# Patient Record
Sex: Male | Born: 1950
Health system: Southern US, Community
[De-identification: ages and names within clinical notes are randomized; demographics above are authoritative.]

## PROBLEM LIST (undated history)

## (undated) DIAGNOSIS — G629 Polyneuropathy, unspecified: Secondary | ICD-10-CM

## (undated) DIAGNOSIS — M549 Dorsalgia, unspecified: Secondary | ICD-10-CM

## (undated) DIAGNOSIS — F419 Anxiety disorder, unspecified: Secondary | ICD-10-CM

## (undated) DIAGNOSIS — I219 Acute myocardial infarction, unspecified: Secondary | ICD-10-CM

## (undated) DIAGNOSIS — I2510 Atherosclerotic heart disease of native coronary artery without angina pectoris: Secondary | ICD-10-CM

## (undated) DIAGNOSIS — Z8619 Personal history of other infectious and parasitic diseases: Secondary | ICD-10-CM

## (undated) DIAGNOSIS — E785 Hyperlipidemia, unspecified: Secondary | ICD-10-CM

## (undated) DIAGNOSIS — F32A Depression, unspecified: Secondary | ICD-10-CM

## (undated) DIAGNOSIS — R0600 Dyspnea, unspecified: Secondary | ICD-10-CM

## (undated) DIAGNOSIS — G4730 Sleep apnea, unspecified: Secondary | ICD-10-CM

## (undated) DIAGNOSIS — H269 Unspecified cataract: Secondary | ICD-10-CM

## (undated) DIAGNOSIS — M1990 Unspecified osteoarthritis, unspecified site: Secondary | ICD-10-CM

## (undated) DIAGNOSIS — C801 Malignant (primary) neoplasm, unspecified: Secondary | ICD-10-CM

## (undated) DIAGNOSIS — K219 Gastro-esophageal reflux disease without esophagitis: Secondary | ICD-10-CM

## (undated) DIAGNOSIS — T7840XA Allergy, unspecified, initial encounter: Secondary | ICD-10-CM

## (undated) DIAGNOSIS — F329 Major depressive disorder, single episode, unspecified: Secondary | ICD-10-CM

## (undated) DIAGNOSIS — I10 Essential (primary) hypertension: Secondary | ICD-10-CM

## (undated) HISTORY — DX: Anxiety disorder, unspecified: F41.9

## (undated) HISTORY — DX: Gastro-esophageal reflux disease without esophagitis: K21.9

## (undated) HISTORY — PX: BILATERAL CARPAL TUNNEL RELEASE: SHX6508

## (undated) HISTORY — DX: Depression, unspecified: F32.A

## (undated) HISTORY — DX: Personal history of other infectious and parasitic diseases: Z86.19

## (undated) HISTORY — DX: Unspecified cataract: H26.9

## (undated) HISTORY — PX: COSMETIC SURGERY: SHX468

## (undated) HISTORY — DX: Essential (primary) hypertension: I10

## (undated) HISTORY — DX: Unspecified osteoarthritis, unspecified site: M19.90

## (undated) HISTORY — PX: CARDIAC CATHETERIZATION: SHX172

## (undated) HISTORY — DX: Hyperlipidemia, unspecified: E78.5

## (undated) HISTORY — DX: Acute myocardial infarction, unspecified: I21.9

## (undated) HISTORY — DX: Dorsalgia, unspecified: M54.9

## (undated) HISTORY — DX: Allergy, unspecified, initial encounter: T78.40XA

## (undated) HISTORY — PX: OTHER SURGICAL HISTORY: SHX169

## (undated) HISTORY — DX: Malignant (primary) neoplasm, unspecified: C80.1

## (undated) HISTORY — DX: Major depressive disorder, single episode, unspecified: F32.9

## (undated) HISTORY — DX: Atherosclerotic heart disease of native coronary artery without angina pectoris: I25.10

## (undated) HISTORY — DX: Sleep apnea, unspecified: G47.30

---
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
## 1982-05-05 HISTORY — PX: WRIST SURGERY: SHX841

## 1995-05-06 DIAGNOSIS — I219 Acute myocardial infarction, unspecified: Secondary | ICD-10-CM

## 1995-05-06 HISTORY — DX: Acute myocardial infarction, unspecified: I21.9

## 1995-05-06 HISTORY — PX: ANGIOPLASTY: SHX39

## 1999-05-06 HISTORY — PX: KNEE ARTHROSCOPY: SHX127

## 1999-10-04 ENCOUNTER — Ambulatory Visit (HOSPITAL_COMMUNITY): Admission: RE | Admit: 1999-10-04 | Discharge: 1999-10-04 | Payer: Self-pay | Admitting: Cardiovascular Disease

## 2000-04-06 ENCOUNTER — Encounter: Admission: RE | Admit: 2000-04-06 | Discharge: 2000-05-04 | Payer: Self-pay | Admitting: Family Medicine

## 2006-07-09 HISTORY — PX: DOPPLER ECHOCARDIOGRAPHY: SHX263

## 2007-05-06 DIAGNOSIS — Z8619 Personal history of other infectious and parasitic diseases: Secondary | ICD-10-CM

## 2007-05-06 HISTORY — DX: Personal history of other infectious and parasitic diseases: Z86.19

## 2008-01-26 ENCOUNTER — Encounter (INDEPENDENT_AMBULATORY_CARE_PROVIDER_SITE_OTHER): Payer: Self-pay | Admitting: *Deleted

## 2008-02-21 ENCOUNTER — Ambulatory Visit: Payer: Self-pay | Admitting: Internal Medicine

## 2008-02-21 DIAGNOSIS — I2510 Atherosclerotic heart disease of native coronary artery without angina pectoris: Secondary | ICD-10-CM | POA: Insufficient documentation

## 2008-02-21 DIAGNOSIS — F528 Other sexual dysfunction not due to a substance or known physiological condition: Secondary | ICD-10-CM

## 2008-02-21 DIAGNOSIS — K219 Gastro-esophageal reflux disease without esophagitis: Secondary | ICD-10-CM

## 2008-02-24 ENCOUNTER — Encounter (INDEPENDENT_AMBULATORY_CARE_PROVIDER_SITE_OTHER): Payer: Self-pay | Admitting: *Deleted

## 2008-02-24 LAB — CONVERTED CEMR LAB
ALT: 32 units/L (ref 0–53)
AST: 27 units/L (ref 0–37)
CO2: 28 meq/L (ref 19–32)
GFR calc Af Amer: 111 mL/min
Glucose, Bld: 109 mg/dL — ABNORMAL HIGH (ref 70–99)
Lymphocytes Relative: 34.2 % (ref 12.0–46.0)
Monocytes Absolute: 0.2 10*3/uL (ref 0.1–1.0)
Monocytes Relative: 2.8 % — ABNORMAL LOW (ref 3.0–12.0)
PSA: 2.34 ng/mL (ref 0.10–4.00)
Platelets: 189 10*3/uL (ref 150–400)
Potassium: 4.8 meq/L (ref 3.5–5.1)
RDW: 12.4 % (ref 11.5–14.6)
Sodium: 142 meq/L (ref 135–145)

## 2009-05-28 ENCOUNTER — Telehealth (INDEPENDENT_AMBULATORY_CARE_PROVIDER_SITE_OTHER): Payer: Self-pay | Admitting: *Deleted

## 2009-06-29 ENCOUNTER — Ambulatory Visit: Payer: Self-pay | Admitting: Internal Medicine

## 2009-06-29 LAB — CONVERTED CEMR LAB
BUN: 13 mg/dL (ref 6–23)
Basophils Absolute: 0 10*3/uL (ref 0.0–0.1)
Basophils Relative: 0.5 % (ref 0.0–3.0)
Chloride: 108 meq/L (ref 96–112)
Cholesterol: 129 mg/dL (ref 0–200)
Eosinophils Absolute: 0.3 10*3/uL (ref 0.0–0.7)
GFR calc non Af Amer: 72.42 mL/min (ref >60)
HDL: 56.6 mg/dL (ref >39.00)
LDL Cholesterol: 41 mg/dL (ref 0–99)
Lymphocytes Relative: 32.3 % (ref 12.0–46.0)
MCHC: 32.9 g/dL (ref 30.0–36.0)
Neutrophils Relative %: 55 % (ref 43.0–77.0)
Platelets: 198 10*3/uL (ref 150.0–400.0)
Potassium: 5.2 meq/L — ABNORMAL HIGH (ref 3.5–5.1)
RBC: 4.84 M/uL (ref 4.22–5.81)
Sodium: 141 meq/L (ref 135–145)
Triglycerides: 159 mg/dL — ABNORMAL HIGH (ref 0.0–149.0)
VLDL: 31.8 mg/dL (ref 0.0–40.0)

## 2009-07-10 ENCOUNTER — Ambulatory Visit: Payer: Self-pay | Admitting: Internal Medicine

## 2009-07-10 DIAGNOSIS — E119 Type 2 diabetes mellitus without complications: Secondary | ICD-10-CM | POA: Insufficient documentation

## 2009-10-31 ENCOUNTER — Encounter: Payer: Self-pay | Admitting: Internal Medicine

## 2009-11-13 ENCOUNTER — Encounter: Payer: Self-pay | Admitting: Internal Medicine

## 2010-06-02 LAB — CONVERTED CEMR LAB
Albumin: 3.9 g/dL
Alkaline Phosphatase: 52 units/L
BUN: 13 mg/dL
Creatinine, Ser: 0.97 mg/dL
Glucose, Bld: 117 mg/dL
HCT: 42.4 %
HDL: 61 mg/dL
Hemoglobin: 14.8 g/dL
LDL Cholesterol: 36 mg/dL
MCH: 34.8 pg
MCV: 92.6 fL
PSA: 0.89 ng/mL
RBC count: 4.58 10*6/uL
Total Protein: 7.1 g/dL
Triglycerides: 121 mg/dL
WBC, blood: 8.9 10*3/uL

## 2010-06-06 NOTE — Letter (Signed)
Summary: changed from vytorin to simva (cost)---cardiology  [facility]   Imported By: Lanelle Bal 11/22/2009 12:50:15  _____________________________________________________________________  External Attachment:    Type:   Image     Comment:   External Document

## 2010-06-06 NOTE — Miscellaneous (Signed)
Summary: Labs  Clinical Lists Changes  Observations: Added new observation of PSA: 0.89 ng/mL (10/31/2009 13:35) Added new observation of TSH: 2.569 microintl units/mL (10/31/2009 13:35) Added new observation of TRIGLYC TOT: 121 mg/dL (16/02/9603 54:09) Added new observation of LDL: 36 mg/dL (81/19/1478 29:56) Added new observation of HDL: 61 mg/dL (21/30/8657 84:69) Added new observation of CHOLESTEROL: 121 mg/dL (62/95/2841 32:44) Added new observation of BILI TOTAL: 0.9 mg/dL (05/07/7251 66:44) Added new observation of ALK PHOS: 52 units/L (10/31/2009 13:35) Added new observation of SGPT (ALT): 23 units/L (10/31/2009 13:35) Added new observation of SGOT (AST): 24 units/L (10/31/2009 13:35) Added new observation of PROTEIN, TOT: 7.1 g/dL (03/47/4259 56:38) Added new observation of ALBUMIN: 3.9 g/dL (75/64/3329 51:88) Added new observation of CALCIUM: 8.9 mg/dL (41/66/0630 16:01) Added new observation of GLUCOSE SER: 117 mg/dL (09/32/3557 32:20) Added new observation of CREATININE: 0.97 mg/dL (25/42/7062 37:62) Added new observation of BUN: 13 mg/dL (83/15/1761 60:73) Added new observation of CO2 TOTAL: 29 mmol/L (10/31/2009 13:35) Added new observation of CHLORIDE: 105 mmol/L (10/31/2009 13:35) Added new observation of POTASSIUM: 4.5 mmol/L (10/31/2009 13:35) Added new observation of SODIUM: 139 mmol/L (10/31/2009 13:35) Added new observation of PLATELETS: 198 10*3/mm3 (10/31/2009 13:35) Added new observation of MCH: 34.8 pg (10/31/2009 13:35) Added new observation of MCV: 92.6 fL (10/31/2009 13:35) Added new observation of HCT: 42.4 % (10/31/2009 13:35) Added new observation of HGB: 14.8 g/dL (71/10/2692 85:46) Added new observation of RBC: 4.58 10*6/mm3 (10/31/2009 13:35) Added new observation of WBC: 8.9 10*3/mm3 (10/31/2009 13:35)

## 2010-06-06 NOTE — Assessment & Plan Note (Signed)
Summary: CPX/NS/KDC, resched.- jr   Vital Signs:  Patient profile:   60 year old male Height:      69.25 inches Weight:      199.6 pounds BMI:     29.37 Pulse rate:   58 / minute BP sitting:   150 / 80  Vitals Entered By: Shary Decamp (July 10, 2009 2:47 PM) CC: cpx, wakes up with numbness in hands off & on x 1 year Is Patient Diabetic? No   History of Present Illness: CPX wakes up with numbness in hands off & on x 1 year, symptoms more frecuent lately numbness is B and involves the whole hand  no posterior neck pain, occasionally pain L from the neck x 2 weeks   Preventive Screening-Counseling & Management  Alcohol-Tobacco     Alcohol type: wkends  Caffeine-Diet-Exercise     Caffeine use/day: 2     Does Patient Exercise: yes     Type of exercise: walk     Times/week: 3      Drug Use:  no.    Current Medications (verified): 1)  Metoprolol Tartrate 50 Mg Tabs (Metoprolol Tartrate) .... 1/2 By Mouth Two Times A Day 2)  Amlodipine Besylate 5 Mg Tabs (Amlodipine Besylate) .Marland Kitchen.. 1 By Mouth Once Daily 3)  Lisinopril 20 Mg Tabs (Lisinopril) .Marland Kitchen.. 1 By Mouth Once Daily 4)  Vytorin 10-40 Mg Tabs (Ezetimibe-Simvastatin) .Marland Kitchen.. 1 By Mouth Once Daily 5)  Zantac 150 Mg Caps (Ranitidine Hcl) .... Once Daily 6)  Aspirin 325 Mg Tabs (Aspirin) 7)  Vitamin C 500mg  8)  Vitamin E 400mg  9)  Centrum Silver  Tabs (Multiple Vitamins-Minerals) 10)  Nitrostat 0.3 Mg Subl (Nitroglycerin) .... As Needed 11)  Viagra 100 Mg Tabs (Sildenafil Citrate) .... 1/2 To 1 By Mouth Once Daily As Needed  Allergies (verified): No Known Drug Allergies  Past History:  Past Medical History: CAD, Dr Tresa Endo, s/p stents----- sees cards yearly  Myocardial infarction, hx of (1997) patient reports stress test 2010 (neg) basal cell carcinoma removed- face x 3  squamous cell removed- [place] GERD L4-L5 bulging disc, L5-S1 - bulging disc, pinched nerve in neck multiple enviormental allergies shingles  (2009) Diabetes mellitus, type II    Past Surgical History: Reviewed history from 02/21/2008 and no changes required. Inguinal herniorrhaphy (1951) keloid (1985) - wrist scope rt knee (2002) angioplasty x 2 (1997)  Family History: Reviewed history from 02/21/2008 and no changes required. CAD - MGM DM - MGM, bro, cousin, nephew HTN - sister colon Ca - no stroke - M (TIA) prostate Ca - F, bro d x age 44  Social History: Married @ girls (1 adopted ) Occupation:executive recruter Tobacco-- quit in the 90s ETOH-- weekends  diet-- trying to eat healthy,count calories, has ost wt in the last 2 months  exercise-- 5/week, 30 min treadmil Does Patient Exercise:  yes Drug Use:  no Caffeine use/day:  2  Review of Systems CV:  Denies chest pain or discomfort and swelling of feet. Resp:  Denies cough and shortness of breath. GI:  Denies bloody stools, nausea, and vomiting. GU:  Denies dysuria and hematuria. Psych:  Denies anxiety and depression; uses advil PM works well when can't sleep.  Physical Exam  General:  alert, well-developed, and well-nourished.   Neck:  no masses, no thyromegaly, and normal carotid upstroke.   Lungs:  normal respiratory effort, no intercostal retractions, no accessory muscle use, and normal breath sounds.   Heart:  normal rate, regular rhythm, and  no murmur.   Abdomen:  soft, non-tender, no distention, no masses, no guarding, and no rigidity.  no bruit  Rectal:  + external hemorhhoid x 1  noted. Normal sphincter tone. No rectal masses or tenderness. hemocult neg  Prostate:  Prostate gland firm and smooth, no enlargement, nodularity, tenderness, mass, asymmetry or induration. Extremities:  no pretibial edema bilaterally    Impression & Recommendations:  Problem # 1:  HEALTH SCREENING (ICD-V70.0) Td 02  had a Cscope, Dr Arlyce Dice per chart review, next Cscope due 2012  all labs reviewed   Problem # 2:  CORONARY ARTERY DISEASE (ICD-414.00) stable   His updated medication list for this problem includes:    Metoprolol Tartrate 50 Mg Tabs (Metoprolol tartrate) .Marland Kitchen... 1/2 by mouth two times a day    Amlodipine Besylate 5 Mg Tabs (Amlodipine besylate) .Marland Kitchen... 1 by mouth once daily    Lisinopril 20 Mg Tabs (Lisinopril) .Marland Kitchen... 1 by mouth once daily    Aspirin 325 Mg Tabs (Aspirin)    Nitrostat 0.3 Mg Subl (Nitroglycerin) .Marland Kitchen... As needed  Problem # 3:  DIABETES MELLITUS, TYPE II (ICD-250.00) I thought this was a new diagnoses based on the hemoglobin A1c from few days ago  however the patient states that he knew about his DM, previous A1C was 6.7 plan: nutritionist referal -- declined, went to one before, loosing wt in the last few weeks   His updated medication list for this problem includes:    Lisinopril 20 Mg Tabs (Lisinopril) .Marland Kitchen... 1 by mouth once daily    Aspirin 325 Mg Tabs (Aspirin)  Labs Reviewed: Creat: 0.9 (02/21/2008)     Problem # 4:  CTS CTS splinters, declined   Problem # 5:  ERECTILE DYSFUNCTION (ICD-302.72) needs a RF very aware of interaction w/ NTG His updated medication list for this problem includes:    Viagra 100 Mg Tabs (Sildenafil citrate) .Marland Kitchen... 1/2 to 1 by mouth once daily as needed  Complete Medication List: 1)  Metoprolol Tartrate 50 Mg Tabs (Metoprolol tartrate) .... 1/2 by mouth two times a day 2)  Amlodipine Besylate 5 Mg Tabs (Amlodipine besylate) .Marland Kitchen.. 1 by mouth once daily 3)  Lisinopril 20 Mg Tabs (Lisinopril) .Marland Kitchen.. 1 by mouth once daily 4)  Vytorin 10-40 Mg Tabs (Ezetimibe-simvastatin) .Marland Kitchen.. 1 by mouth once daily 5)  Zantac 150 Mg Caps (Ranitidine hcl) .... Once daily 6)  Aspirin 325 Mg Tabs (Aspirin) 7)  Vitamin C 500mg   8)  Vitamin E 400mg   9)  Centrum Silver Tabs (Multiple vitamins-minerals) 10)  Nitrostat 0.3 Mg Subl (Nitroglycerin) .... As needed 11)  Viagra 100 Mg Tabs (Sildenafil citrate) .... 1/2 to 1 by mouth once daily as needed  Patient Instructions: 1)  Please schedule a follow-up  appointment in 3 months .  Prescriptions: VIAGRA 100 MG TABS (SILDENAFIL CITRATE) 1/2 to 1 by mouth once daily as needed  #10 x 3   Entered and Authorized by:   Elita Quick E. Daxtyn Rottenberg MD   Signed by:   Nolon Rod. Jenesys Casseus MD on 07/10/2009   Method used:   Print then Give to Patient   RxID:   [id number]    Preventive Care Screening  Last Tetanus Booster:    Date:  05/05/2000    Results:  per pt 2002  Prior Values:    PSA:  2.34 (02/21/2008)    Colonoscopy:  normal (05/05/2000)    Last Flu Shot:  Fluvax Non-MCR (02/21/2008)    Risk Factors:  Drug use:  no  Caffeine use:  2 drinks per day    Type:  wkends Exercise:  yes    Times per week:  3    Type:  walk

## 2010-06-06 NOTE — Progress Notes (Signed)
Summary: CPX LABS  Phone Note Call from Patient   Caller: Patient Summary of Call: PATIENT HAS A APPT FOR A CPX ON MARCH 4,2011 AND CPX LABS ON FEB 25,2011. NEED LAB ORDERS PLEASE. Initial call taken by: Barb Merino,  May 28, 2009 5:00 PM  Follow-up for Phone Call        tsh, cbc, bmp, lipid, alt, ast, psa dx 70.0, 414.00 Follow-up by: Shary Decamp,  May 30, 2009 3:55 PM    Additional Follow-up for Phone Call Additional follow up Details #2::    labs are scheduled...Marland KitchenMarland KitchenBarb Merino  May 30, 2009 4:14 PM  Follow-up by: Barb Merino,  May 30, 2009 4:14 PM

## 2010-09-20 NOTE — Cardiovascular Report (Signed)
[facility]  Patient:    Dakota Gibbs, Dakota Gibbs                  MRN: [id number] Proc. Date: 10/04/99 Adm. Date:  [id number] Disc. Date: [id number] Attending:  Virgina Evener CC:         Lennette Bihari, M.D.                        Cardiac Catheterization  INDICATIONS:  Mr. Dublin Grayer is a 60 year old white male, who on May 10, 1995 developed an acute inferior wall MI.  He underwent an acute PTCA of a totally occluded right coronary artery in the region of the crux. At that time, he did have distal 50-60% smooth PLA narrowing, which was now treated.  Several days later he underwent staged intervention of this left circumflex OM1 (which was 75% narrowed), and a long 95% stenosis in his OM2 vessel.  He also had mild concomitant CAD, with 60% [name] and 70% diagonal stenoses.  He has been on medically therapy since, including aggressive lipid intervention, lifestyle change and exercise.  He had done well.  Prior Cardiolite scans have been nonischemic, and have only shown mild nontransmural inferior abnormality.  This past weekend, he apparently was at the beach ([facility] [place], [place] and developed what seemed to be recurrent anginal symptoms associated with diaphoresis.  He was admitted to the local hospital, where he ruled out for myocardial infarction.  He presented to our office this week in follow-up.  Because of his recurrent symptoms of angina he is now referred for repeat definitive catheterization.  HEMODYNAMIC DATA: 1. Central aortic pressure:  167/77. 2. Left ventricular pressure:  167/25.  ANGIOGRAPHIC DATA: 1. Left Main Coronary Artery:  Angiographically normal and bifurcated into an    [name] and left circumflex system. 2. Left Anterior Descending Coronary Artery:  Gave rise to a proximal diagonal    vessel, which had smooth narrowing of 50%.  The [name] in the region beyond    this was approximately 50% narrowed; again was  smooth.  The mid [name] seemed    to go intramyocardial, but there did not appear to be any significant    systolic bridging. 3. Circumflex Vessel:  Gave rise to three marginal vessels.  There was no    restenosis at the prior PTCA sites of the OM1 and OM2 vessels.  There was    no change in the mild 20% ostial OM3 narrowing. 4. Right Coronary Artery:  A large vessel.  There was no restenosis at the    site of prior acute occlusion, with PTCA.  There was residual narrowing of    less than 10%.  There was 50% narrowing at the origin of the PLA, in the    region of the crux of the vessel; but this seemed to be even less than that    during diastole when the vessel was more straight and not on a sharp bend.    The mid PLA had smooth 40% narrowing.  LEFT VENTRICULOGRAPHY:  Biplane cineangiographic left ventriculography revealed normal preserved global LV function; although there was focal residual hypocontractility in the inferoapical of the low posterolateral wall from his prior MI.  DISTAL AORTOGRAPHY:  Did not demonstrate any significant aortoiliac disease. There was no evidence for renal artery stenosis.  IMPRESSION: 1. Preserved global LV function, with mild residual.  Hypocontractility of the    mid inferior and  inferoapical to low posterolateral wall. 2. No significant restenosis at prior coronary intervention sites, including    the right coronary artery as well as OM1 and OM2 vessels. 3. There is 50% smooth narrowing in the first diagonal vessel of the [name],    with 50% smooth [name] narrowing. 4. No restenosis of the OM1 and the OM2 of the circumflex, with no change in    the 20% ostial narrowing in the OM3 vessel. 5. No restenosis of the right coronary artery at the acute angioplasty site,    with 50% distal RCA narrowing; being smooth at the crux of the origin at    the PLA, and 40% mid PLA.  RECOMMENDATIONS:  Medical therapy. DD:  10/04/99 TD:  10/07/99 Job:  [id number]

## 2011-03-18 ENCOUNTER — Encounter: Payer: Self-pay | Admitting: Internal Medicine

## 2011-03-18 ENCOUNTER — Telehealth: Payer: Self-pay

## 2011-03-18 NOTE — Telephone Encounter (Signed)
Spoke with pt and has an appointment March 20 2011 at 10:30am

## 2011-03-19 ENCOUNTER — Encounter: Payer: Self-pay | Admitting: Gastroenterology

## 2011-03-19 ENCOUNTER — Encounter: Payer: Self-pay | Admitting: Internal Medicine

## 2011-03-20 ENCOUNTER — Encounter: Payer: Self-pay | Admitting: Internal Medicine

## 2011-03-20 ENCOUNTER — Ambulatory Visit (INDEPENDENT_AMBULATORY_CARE_PROVIDER_SITE_OTHER): Payer: PRIVATE HEALTH INSURANCE | Admitting: Internal Medicine

## 2011-03-20 DIAGNOSIS — Z23 Encounter for immunization: Secondary | ICD-10-CM

## 2011-03-20 DIAGNOSIS — Z0000 Encounter for general adult medical examination without abnormal findings: Secondary | ICD-10-CM

## 2011-03-20 DIAGNOSIS — D179 Benign lipomatous neoplasm, unspecified: Secondary | ICD-10-CM | POA: Insufficient documentation

## 2011-03-20 NOTE — Assessment & Plan Note (Addendum)
Td 02 and today Flu shot today Had shingles 2009, hold immunization for now had a Cscope, Dr Arlyce Dice per chart review, next Cscope due 2012---> referral done  FLP per cards Labs drawn at [facility], not available to me at this time, patient will call [facility] and get them faxed to me. We will do additional blood work if needed.

## 2011-03-20 NOTE — Progress Notes (Signed)
Subjective:    Patient ID: Dakota Gibbs, male    DOB: 04/11/1951, 60 y.o.   MRN: [id number]  HPI Complete physical exam Occasional bilateral shoulder pain, better with Tylenol, Advil did not help. Found a knot in the left inguinal area 2 months ago, it resembles previous knots he had in his legs (lipomas?). No tender. Saw cardiology yesterday, Vytorin was switched to Crestor, and they will followup with him in 3 months  Past Medical History  Diagnosis Date  . CAD (coronary artery disease)       Dr Tresa Endo, s/p stents----- sees cards yearly   . Myocardial infarction 1997  . GERD (gastroesophageal reflux disease)   . Allergy     enviornmental  . Diabetes mellitus   . History of shingles 2009  . Cancer     basal cell; carcinoma removed x3 and SCC  . Back pain     L4-L5 bulging disc, L5-S1 - bulging disc, pinched nerve in neck   Past Surgical History  Procedure Date  . Hernia repair 1951    RIGHT  . Angioplasty   . Knee arthroscopy     R 2002   History   Social History  . Marital Status: Married    Spouse Name: N/A    Number of Children: 2  . Years of Education: N/A   Occupational History  . executive recruter     Social History Main Topics  . Smoking status: Former Smoker    Quit date: 03/19/1992  . Smokeless tobacco: Never Used  . Alcohol Use: Yes     socially   . Drug Use: No  . Sexually Active: Not on file   Other Topics Concern  . Not on file   Social History Narrative   Diet: "ok", won't be able to change per pt---Exercises 5x weekly, treadmil   Family History  Problem Relation Age of Onset  . Stroke Mother     TIA  . Hypertension Sister   . Prostate cancer      Father and brother   . Coronary artery disease      GM  . Diabetes      B, GM, other fami;ly members   . Colon cancer Neg Hx       Review of Systems  Respiratory: Negative for cough and wheezing.   Cardiovascular: Negative for chest pain and leg swelling.  Gastrointestinal:  Negative for abdominal pain and blood in stool.  Genitourinary: Negative for dysuria, frequency, hematuria and difficulty urinating.  Psychiatric/Behavioral:       No depression, anxiety       Objective:   Physical Exam  Constitutional: He is oriented to person, place, and time. He appears well-developed and well-nourished. No distress.  HENT:  Head: Normocephalic and atraumatic.  Neck: No thyromegaly present.       Nl carotid pulse   Cardiovascular: Normal rate, regular rhythm and normal heart sounds.   No murmur heard. Pulmonary/Chest: Effort normal and breath sounds normal. No respiratory distress. He has no wheezes. He has no rales.  Abdominal: Soft. Bowel sounds are normal. He exhibits no distension. There is no tenderness. There is no rebound and no guarding.  Genitourinary: Rectum normal and prostate normal.          Penis, testicles and epididymus normal. No inguinal hernias noted   Neurological: He is alert and oriented to person, place, and time.  Skin: He is not diaphoretic.  Psychiatric: He has a normal mood and affect.  His behavior is normal. Judgment and thought content normal.        Assessment & Plan:

## 2011-03-20 NOTE — Assessment & Plan Note (Signed)
Mass at the inguinal ring likely a lipoma  We discussed CT, surgical referral, observation We agreed to RTC in 6 months, if needed a CT will be ordered Cont self exams, notify of changes

## 2011-03-21 ENCOUNTER — Encounter: Payer: Self-pay | Admitting: Gastroenterology

## 2011-03-22 ENCOUNTER — Telehealth: Payer: Self-pay | Admitting: Internal Medicine

## 2011-03-22 DIAGNOSIS — E119 Type 2 diabetes mellitus without complications: Secondary | ICD-10-CM

## 2011-03-22 NOTE — Telephone Encounter (Signed)
Advise patient: I reviewed his labs, needs a HgA1C, dx DM to be done either here or at [facility]

## 2011-03-26 NOTE — Telephone Encounter (Signed)
Pt aware, lab appointment scheduled and order placed

## 2011-04-02 ENCOUNTER — Other Ambulatory Visit: Payer: Self-pay | Admitting: Internal Medicine

## 2011-04-04 ENCOUNTER — Other Ambulatory Visit (INDEPENDENT_AMBULATORY_CARE_PROVIDER_SITE_OTHER): Payer: PRIVATE HEALTH INSURANCE

## 2011-04-04 DIAGNOSIS — E119 Type 2 diabetes mellitus without complications: Secondary | ICD-10-CM

## 2011-04-04 LAB — HEMOGLOBIN A1C: Hgb A1c MFr Bld: 6.1 % (ref 4.6–6.5)

## 2011-04-04 NOTE — Progress Notes (Signed)
2

## 2011-04-07 ENCOUNTER — Other Ambulatory Visit: Payer: PRIVATE HEALTH INSURANCE | Admitting: Gastroenterology

## 2011-04-25 ENCOUNTER — Encounter: Payer: Self-pay | Admitting: Internal Medicine

## 2011-05-21 ENCOUNTER — Encounter: Payer: Self-pay | Admitting: Gastroenterology

## 2011-07-14 ENCOUNTER — Encounter: Payer: Self-pay | Admitting: Gastroenterology

## 2011-07-14 ENCOUNTER — Ambulatory Visit (AMBULATORY_SURGERY_CENTER): Payer: PRIVATE HEALTH INSURANCE | Admitting: *Deleted

## 2011-07-14 VITALS — Ht 69.0 in | Wt 206.0 lb

## 2011-07-14 DIAGNOSIS — Z1211 Encounter for screening for malignant neoplasm of colon: Secondary | ICD-10-CM

## 2011-07-14 MED ORDER — PEG-KCL-NACL-NASULF-NA ASC-C 100 G PO SOLR: ORAL | Status: DC

## 2011-07-25 ENCOUNTER — Encounter: Payer: Self-pay | Admitting: Gastroenterology

## 2011-07-25 ENCOUNTER — Ambulatory Visit (AMBULATORY_SURGERY_CENTER): Payer: PRIVATE HEALTH INSURANCE | Admitting: Gastroenterology

## 2011-07-25 VITALS — BP 152/73 | HR 70 | Temp 96.1°F | Resp 20 | Ht 69.0 in | Wt 206.0 lb

## 2011-07-25 DIAGNOSIS — D126 Benign neoplasm of colon, unspecified: Secondary | ICD-10-CM

## 2011-07-25 DIAGNOSIS — Z1211 Encounter for screening for malignant neoplasm of colon: Secondary | ICD-10-CM

## 2011-07-25 MED ORDER — SODIUM CHLORIDE 0.9 % IV SOLN: 500.0000 mL | INTRAVENOUS | Status: DC

## 2011-07-25 NOTE — Patient Instructions (Signed)

## 2011-07-25 NOTE — Op Note (Signed)
[facility] [address] [place], [place]  [address]  COLONOSCOPY PROCEDURE REPORT  PATIENT:  Dakota Gibbs, Dakota Gibbs  MR#:  [id number] BIRTHDATE:  Jun 28, 1950, 60 yrs. old  GENDER:  male ENDOSCOPIST:  Daeton Kluth. Arlyce Dice, MD REF. BY: PROCEDURE DATE:  07/25/2011 PROCEDURE:  Colonoscopy with snare polypectomy, Colon with cold biopsy polypectomy ASA CLASS:  Class II INDICATIONS:  Routine Risk Screening MEDICATIONS:   MAC sedation, administered by CRNA propofol 350mg IV  DESCRIPTION OF PROCEDURE:   After the risks benefits and alternatives of the procedure were thoroughly explained, informed consent was obtained.  Digital rectal exam was performed and revealed external hemorrhoids.   The [id number] endoscope was introduced through the anus and advanced to the cecum, which was identified by both the appendix and ileocecal valve, without limitations.  The quality of the prep was excellent, using MoviPrep.  The instrument was then slowly withdrawn as the colon was fully examined. <<PROCEDUREIMAGES>>  FINDINGS:  A sessile polyp was found in the ascending colon. It was 3 mm in size. Polyp was snared without cautery. Retrieval was successful (see image5). snare polyp  A sessile polyp was found in the descending colon. It was 3 mm in size. Flat polyp Polyp was snared without cautery. Retrieval was successful. snare polyp  A diminutive polyp was found in the sigmoid colon. It was 2 mm in size. It was found 25 cm from the point of entry. The polyp was removed using cold biopsy forceps.  Scattered diverticula were found in the sigmoid to descending colon segments (see image1). This was otherwise a normal examination of the colon (see image4 and image6).   Retroflexed views in the rectum revealed no abnormalities.    The time to cecum =  1) 3.50  minutes. The scope was then withdrawn in  1) 12.25  minutes from the cecum and the procedure completed. COMPLICATIONS:  None ENDOSCOPIC  IMPRESSION: 1) 3 mm sessile polyp in the ascending colon 2) 3 mm sessile polyp in the descending colon 3) 2 mm diminutive polyp in the sigmoid colon 4) Diverticula, scattered in the sigmoid to descending colon segments 5) Otherwise normal examination RECOMMENDATIONS: 1) If the polyp(s) removed today are proven to be adenomatous (pre-cancerous) polyps, you will need a repeat colonoscopy in 5 years. Otherwise you should continue to follow colorectal cancer screening guidelines for "routine risk" patients with colonoscopy in 10 years. You will receive a letter within 1-2 weeks with the results of your biopsy as well as final recommendations. Please call my office if you have not received a letter after 3 weeks. REPEAT EXAM:   You will receive a letter from Dr. Arlyce Dice in 1-2 weeks, after reviewing the final pathology, with followup recommendations.  ______________________________ Barbette Hair Arlyce Dice, MD  CC:  Willow Ora, MD  n. Rosalie Doctor:   Barbette Hair.  at 07/25/2011 08:57 AM  Orlena Sheldon, [phone]

## 2011-07-25 NOTE — Progress Notes (Addendum)
Propofol administered by D. Merritt CRNA  Pt 's EKG- SR with PVCs and PACs at the beginning of the procedure.  Pt having some bigeminy at the end.  Dr. Arlyce Dice made aware per CRNA

## 2011-07-25 NOTE — Progress Notes (Signed)
Patient did not experience any of the following events: a burn prior to discharge; a fall within the facility; wrong site/side/patient/procedure/implant event; or a hospital transfer or hospital admission upon discharge from the facility. (G8907) Patient did not have preoperative order for IV antibiotic SSI prophylaxis. (G8918)  

## 2011-07-28 ENCOUNTER — Telehealth: Payer: Self-pay | Admitting: *Deleted

## 2011-07-28 NOTE — Telephone Encounter (Signed)
  Follow up Call-  Call back number 07/25/2011  Post procedure Call Back phone  # [phone]  Permission to leave phone message Yes     Patient questions:  Do you have a fever, pain , or abdominal swelling? no Pain Score  0 *  Have you tolerated food without any problems? yes  Have you been able to return to your normal activities? yes  Do you have any questions about your discharge instructions: Diet   no Medications  no Follow up visit  no  Do you have questions or concerns about your Care? no  Actions: * If pain score is 4 or above: No action needed, pain <4.

## 2011-07-30 ENCOUNTER — Encounter: Payer: Self-pay | Admitting: Gastroenterology

## 2011-08-22 HISTORY — PX: NM MYOCAR PERF WALL MOTION: HXRAD629

## 2012-05-13 ENCOUNTER — Telehealth: Payer: Self-pay | Admitting: *Deleted

## 2012-05-13 NOTE — Telephone Encounter (Signed)
Labs received [facility]. Per Dr. Drue Novel the pt has elevated blood sugar.  Called & discussed with pt, he stated that he would need to call back & schedule appt.

## 2012-08-30 ENCOUNTER — Telehealth: Payer: Self-pay | Admitting: Internal Medicine

## 2012-08-30 NOTE — Telephone Encounter (Signed)
Left message on machine for patient to contact Dr. Drue Novel, pcp

## 2012-08-30 NOTE — Telephone Encounter (Signed)
Pt is unknown to me

## 2012-08-30 NOTE — Telephone Encounter (Signed)
Patient Information:  Caller Name: Nadine Counts  Phone: [phone]  Patient: Dakota Gibbs  Gender: Male  DOB: 01-27-1951  Age: 62 Years  PCP: Willow Ora  Office Follow Up:  Does the office need to follow up with this patient?: Yes  Instructions For The Office: PLEASE CALL PT ASAP AND SCHEDULE AT FIRST OPPORTUNITY. NO APPT TODAY.  RN Note:  PLEASE CALL PT ASAP AND SCHEDULE AT FIRST OPPORTUNITY. NO APPT TODAY. [facility] WOULD NOT LET ME SCHEDULE IN 1130 SLOT FOR 4/29 WITH DR. Laury Axon.  Symptoms  Reason For Call & Symptoms: Pt calling regarding left sided rib pain after falling 2-3 feet from car seat, while reaching something on the roof of the car, on 4/25. No bruising. No visual deformity. Hurts with deep breathing and hiccuping. Not getting better. Does not feel like angina or "chest pain".  Reviewed Health History In EMR: Yes  Reviewed Medications In EMR: Yes  Reviewed Allergies In EMR: Yes  Reviewed Surgeries / Procedures: Yes  Date of Onset of Symptoms: 08/27/2012  Guideline(s) Used:  Chest Pain  Chest Injury  Disposition Per Guideline:   Go to ED Now (or to Office with PCP Approval)  Reason For Disposition Reached:   Can't take a deep breath but no respiratory distress (e.g., hurts to take a deep breath)  Advice Given:  Call Back If:  You have more questions  You become worse.  Patient Will Follow Care Advice:  YES

## 2012-08-31 ENCOUNTER — Ambulatory Visit (INDEPENDENT_AMBULATORY_CARE_PROVIDER_SITE_OTHER)
Admission: RE | Admit: 2012-08-31 | Discharge: 2012-08-31 | Disposition: A | Discharge status: Home / Self Care | Payer: BC Managed Care – PPO | Source: Ambulatory Visit | Attending: Family Medicine | Admitting: Family Medicine

## 2012-08-31 ENCOUNTER — Ambulatory Visit (INDEPENDENT_AMBULATORY_CARE_PROVIDER_SITE_OTHER): Payer: BC Managed Care – PPO | Admitting: Family Medicine

## 2012-08-31 ENCOUNTER — Encounter: Payer: Self-pay | Admitting: Family Medicine

## 2012-08-31 VITALS — BP 130/60 | HR 75 | Temp 98.4°F | Wt 207.2 lb

## 2012-08-31 DIAGNOSIS — R079 Chest pain, unspecified: Secondary | ICD-10-CM

## 2012-08-31 DIAGNOSIS — M549 Dorsalgia, unspecified: Secondary | ICD-10-CM

## 2012-08-31 DIAGNOSIS — R0781 Pleurodynia: Secondary | ICD-10-CM

## 2012-08-31 LAB — POCT URINALYSIS DIPSTICK
Glucose, UA: NEGATIVE
Leukocytes, UA: NEGATIVE
Nitrite, UA: NEGATIVE
Spec Grav, UA: 1.025
Urobilinogen, UA: 0.2

## 2012-08-31 MED ORDER — HYDROCODONE-ACETAMINOPHEN 5-325 MG PO TABS: 1.0000 | ORAL_TABLET | Freq: Four times a day (QID) | ORAL | Status: DC | PRN

## 2012-08-31 NOTE — Assessment & Plan Note (Signed)
Pt has seen ortho in past May need new referral if no improvement

## 2012-08-31 NOTE — Patient Instructions (Addendum)
Rib Contusion  A rib contusion (bruise) can occur by a blow to the chest or by a fall against a hard object. Usually these will be much better in a couple weeks. If X-rays were taken today and there are no broken bones (fractures), the diagnosis of bruising is made. However, broken ribs may not show up for several days, or may be discovered later on a routine X-ray when signs of healing show up. If this happens to you, it does not mean that something was missed on the X-ray, but simply that it did not show up on the first X-rays. Earlier diagnosis will not usually change the treatment.  HOME CARE INSTRUCTIONS   · Avoid strenuous activity. Be careful during activities and avoid bumping the injured ribs. Activities that pull on the injured ribs and cause pain should be avoided, if possible.  · For the first day or two, an ice pack used every 20 minutes while awake may be helpful. Put ice in a plastic bag and put a towel between the bag and the skin.  · Eat a normal, well-balanced diet. Drink plenty of fluids to avoid constipation.  · Take deep breaths several times a day to keep lungs free of infection. Try to cough several times a day. Splint the injured area with a pillow while coughing to ease pain. Coughing can help prevent pneumonia.  · Wear a rib belt or binder only if told to do so by your caregiver. If you are wearing a rib belt or binder, you must do the breathing exercises as directed by your caregiver. If not used properly, rib belts or binders restrict breathing which can lead to pneumonia.  · Only take over-the-counter or prescription medicines for pain, discomfort, or fever as directed by your caregiver.  SEEK MEDICAL CARE IF:   · You or your child has an oral temperature above 102° F (38.9° C).  · Your baby is older than 3 months with a rectal temperature of 100.5° F (38.1° C) or higher for more than 1 day.  · You develop a cough, with thick or bloody sputum.  SEEK IMMEDIATE MEDICAL CARE IF:   · You  have difficulty breathing.  · You feel sick to your stomach (nausea), have vomiting or belly (abdominal) pain.  · You have worsening pain, not controlled with medications, or there is a change in the location of the pain.  · You develop sweating or radiation of the pain into the arms, jaw or shoulders, or become light headed or faint.  · You or your child has an oral temperature above 102° F (38.9° C), not controlled by medicine.  · Your or your baby is older than 3 months with a rectal temperature of 102° F (38.9° C) or higher.  · Your baby is 3 months old or younger with a rectal temperature of 100.4° F (38° C) or higher.  MAKE SURE YOU:   · Understand these instructions.  · Will watch your condition.  · Will get help right away if you are not doing well or get worse.  Document Released: 01/14/2001 Document Revised: 07/14/2011 Document Reviewed: 12/08/2007  [employer]® Patient Information ©2013 [employer].

## 2012-08-31 NOTE — Assessment & Plan Note (Signed)
Check xrays vicodin es  rto prn  ua normal

## 2012-08-31 NOTE — Progress Notes (Signed)
  Subjective:    Patient ID: Dakota Gibbs, male    DOB: 1950/06/11, 62 y.o.   MRN: [id number]  HPI Pt here c/o L rib pain.  He was putting something on top of his car and he stepped on seat to put it up and went to step down and fell on R side.  No cp, no sob   Review of Systems As above     Objective:   Physical Exam  BP 130/60  Pulse 75  Temp(Src) 98.4 F (36.9 C) (Oral)  Wt 207 lb 3.2 oz (93.985 kg)  BMI 30.58 kg/m2  SpO2 97% General appearance: alert, cooperative, appears stated age and no distress Extremities: extremities normal, atraumatic, no cyanosis or edema Neurologic: Sensory: normal, normal Motor: grossly normal Gait: Normal L ribs--+ tenderness low ribs and above, no creitus R shoulder pain--- numbness R arm and between shoulder blades      Assessment & Plan:

## 2012-09-01 ENCOUNTER — Telehealth: Payer: Self-pay | Admitting: Internal Medicine

## 2012-09-01 NOTE — Telephone Encounter (Signed)
No rib fractures--- probably bruised Call if symptoms do not improve or worsen.  Spoke with patient and he voiced understanding. No questions or concerns at this time.   KP

## 2012-09-01 NOTE — Telephone Encounter (Signed)
Pt called to see if there x-ray results were back yet. thanks

## 2012-09-07 ENCOUNTER — Encounter: Payer: Self-pay | Admitting: Lab

## 2012-09-08 ENCOUNTER — Ambulatory Visit (INDEPENDENT_AMBULATORY_CARE_PROVIDER_SITE_OTHER): Payer: BC Managed Care – PPO | Admitting: Internal Medicine

## 2012-09-08 VITALS — BP 134/70 | HR 55 | Wt 205.0 lb

## 2012-09-08 DIAGNOSIS — R0781 Pleurodynia: Secondary | ICD-10-CM

## 2012-09-08 DIAGNOSIS — M546 Pain in thoracic spine: Secondary | ICD-10-CM

## 2012-09-08 DIAGNOSIS — M549 Dorsalgia, unspecified: Secondary | ICD-10-CM

## 2012-09-08 MED ORDER — HYDROCODONE-ACETAMINOPHEN 5-325 MG PO TABS: 1.0000 | ORAL_TABLET | Freq: Four times a day (QID) | ORAL | Status: DC | PRN

## 2012-09-08 MED ORDER — CYCLOBENZAPRINE HCL 10 MG PO TABS: 10.0000 mg | ORAL_TABLET | Freq: Two times a day (BID) | ORAL | Status: DC | PRN

## 2012-09-08 NOTE — Patient Instructions (Addendum)
Motrin 200 mg 2 tablets every 6 hours as needed for pain. Always take it with food. Watch for stomach side effects (gastritis): nausea, stomach pain, change in the color of stools. Flexeril , muscle relaxant, twice a day as needed, will cause drowsiness vicodin if pain intense Warm compress  Call if no better in 2 weeks --- Schedule a physical at your convenience

## 2012-09-08 NOTE — Progress Notes (Signed)
  Subjective:    Patient ID: Dakota Gibbs, male    DOB: 09/17/1950, 62 y.o.   MRN: [id number]  HPI Acute visit, last time seen by me 2012. 3 or 4 weeks history of right upper back pain, the pain is on and off, worse when he Hyperflex or hyperextend his neck. Not worse with shoulder motions. He did have a fall few days ago, saw Dr.Lowne, was prescribed Vicodin for a left chest wall pain, x-ray show no abnormalities in the lungs or ribs. The pain @ the L chest wall is better. Vicodin has not help the current pain.  Past Medical History  Diagnosis Date  . CAD (coronary artery disease)       Dr Tresa Endo, s/p stents----- sees cards yearly   . Myocardial infarction 1997  . GERD (gastroesophageal reflux disease)   . Allergy     enviornmental  . History of shingles 2009  . Cancer     basal cell; carcinoma removed x3 and SCC  . Back pain     L4-L5 bulging disc, L5-S1 - bulging disc, pinched nerve in neck  . Diabetes mellitus     pt denies   Past Surgical History  Procedure Laterality Date  . Hernia repair  1951    RIGHT  . Angioplasty  1997  . Knee arthroscopy  2001    left knee  . Wrist surgery  1984    right  . Basel cell        Review of Systems Denies any neck pain per se. He does  have paresthesias in both hands sometimes but denies any gait disturbances or lower extremity paresthesias. Occasional pain at the base of the right thumb right thumb and trigger phenomena. No fever chills. No cough or weight loss.     Objective:   Physical Exam  Musculoskeletal:       Arms:  BP 134/70  Pulse 55  Wt 205 lb (92.987 kg)  BMI 30.26 kg/m2  SpO2 94%  General -- alert, well-developed, No apparent distress   Neck --no Tender to palpation of the cervical spine, range of motion is normal, did complain of some pain at the R upper  back with neck hyperflexion Lungs -- normal respiratory effort, no intercostal retractions, no accessory muscle use, and normal breath sounds.     Heart-- normal rate, regular rhythm, no murmur, and no gallop.   Extremities-- no pretibial edema bilaterally; Shoulders symmetric, range of motion normal bilaterally Neurologic-- alert & oriented X3; Gait normal, DTRs and strength symmetric. Psych-- Cognition and judgment appear intact. Alert and cooperative with normal attention span and concentration.  not anxious appearing and not depressed appearing.        Assessment & Plan:   R upper back pain, Pain at the right upper back, worse with neck motion, no neck pain per se. Trapezoid sprain?. Had a recent falls however symptoms preceded the fall. Plan: Add Flexeril , Motrin, Vicodin as needed. If not better will let me know.  Also complained of pain at the base of the right thumb and  trigger phenomena, pain is likely a DJD, consider ortho referral

## 2012-09-09 ENCOUNTER — Encounter: Payer: Self-pay | Admitting: Internal Medicine

## 2012-10-22 ENCOUNTER — Encounter: Payer: Self-pay | Admitting: Lab

## 2012-10-25 ENCOUNTER — Ambulatory Visit (INDEPENDENT_AMBULATORY_CARE_PROVIDER_SITE_OTHER): Payer: BC Managed Care – PPO | Admitting: Internal Medicine

## 2012-10-25 ENCOUNTER — Encounter: Payer: Self-pay | Admitting: Internal Medicine

## 2012-10-25 VITALS — BP 155/70 | HR 56 | Temp 98.1°F | Wt 197.8 lb

## 2012-10-25 DIAGNOSIS — F341 Dysthymic disorder: Secondary | ICD-10-CM

## 2012-10-25 DIAGNOSIS — F329 Major depressive disorder, single episode, unspecified: Secondary | ICD-10-CM | POA: Insufficient documentation

## 2012-10-25 DIAGNOSIS — F419 Anxiety disorder, unspecified: Secondary | ICD-10-CM

## 2012-10-25 DIAGNOSIS — F32A Depression, unspecified: Secondary | ICD-10-CM

## 2012-10-25 HISTORY — DX: Depression, unspecified: F32.A

## 2012-10-25 HISTORY — DX: Anxiety disorder, unspecified: F41.9

## 2012-10-25 MED ORDER — CITALOPRAM HYDROBROMIDE 20 MG PO TABS: 30.0000 mg | ORAL_TABLET | Freq: Every day | ORAL | Status: DC

## 2012-10-25 NOTE — Assessment & Plan Note (Addendum)
Anxiety, and depression. PHQ 9 is 24 , Fortunately he denies s/h ideas Patient is counseled, he is already taking very smart and appropriate steps to correct his finances. We talk  about different modalities of treatment including counseling, medication, etc. He states that he will seriously think about counseling. He self prescribed citalopram 20 mg and is working, we agreed to come increase the dose to 30 mg. See instructions. We talk about a sleeping medication, his is sleeping better lately, we will hold off on that. We talk about alcohol, he knows 5 glasses of wine a day is too much, for the last few weeks has actually cut down, I recommend no more than 1-2 glasses a night. We agreed to meet again in 4-5 weeks. F2F > 25 min

## 2012-10-25 NOTE — Patient Instructions (Addendum)
Next week, increase citalopram to 1.5 tablets a day Next visit 1 month

## 2012-10-25 NOTE — Progress Notes (Signed)
  Subjective:    Patient ID: Dakota Gibbs, male    DOB: 1950/08/15, 62 y.o.   MRN: [id number]  HPI Here for eval of anxiety and depression. Having financial problems for many years, that put  a lot of stress on him. He knew he was depressed and anxious for a while, symptoms are described as feeling angry, irritable, short temper, sometimes feeling like a failure Symptoms exacerbated 6 weeks ago by loss of brother in law. 10 days ago, he self prescribed citalopram 20 mg one tablet daily, medication belonged to his wife, he felt almost immediately much better. He still has a lack of drive and  is not motivated to do much  Past Medical History  Diagnosis Date  . CAD (coronary artery disease)       Dr Tresa Endo, s/p stents----- sees cards yearly   . Myocardial infarction 1997  . GERD (gastroesophageal reflux disease)   . Allergy     enviornmental  . History of shingles 2009  . Cancer     basal cell; carcinoma removed x3 and SCC  . Back pain     L4-L5 bulging disc, L5-S1 - bulging disc, pinched nerve in neck  . Diabetes mellitus     pt denies  . Anxiety and depression 10/25/2012   Past Surgical History  Procedure Laterality Date  . Hernia repair  1951    RIGHT  . Angioplasty  1997  . Knee arthroscopy  2001    left knee  . Wrist surgery  1984    right  . Basel cell     History   Social History  . Marital Status: Married    Spouse Name: N/A    Number of Children: 2  . Years of Education: N/A   Occupational History  . executive recruter     Social History Main Topics  . Smoking status: Former Smoker    Quit date: 03/19/1992  . Smokeless tobacco: Never Used  . Alcohol Use: Yes     Comment:  5 glasses wine / night  . Drug Use: No  . Sexually Active: Not on file   Other Topics Concern  . Not on file   Social History Narrative                    Review of Systems Has been drinking a bottle of wine at night x a while. Denies any suicidal or violent  thoughts. Having difficulty with sleep. Physically he feels well, his cardiologist checks on him and is getting good reports    Objective:   Physical Exam General -- alert, well-developed, NAD .   Neurologic-- alert & oriented X3 and strength normal in all extremities. Psych-- Cognition and judgment appear intact. Alert and cooperative with normal attention span and concentration.  not anxious appearing and not depressed appearing.       Assessment & Plan:  Today , I spent more than 25 min with the patient, >50% of the time counseling

## 2012-12-06 ENCOUNTER — Other Ambulatory Visit: Payer: Self-pay | Admitting: Internal Medicine

## 2012-12-06 NOTE — Telephone Encounter (Signed)
Last seen 10/25/12. Rx not on medication list. Please advise     KP

## 2012-12-13 ENCOUNTER — Telehealth: Payer: Self-pay | Admitting: *Deleted

## 2012-12-13 MED ORDER — CYCLOBENZAPRINE HCL 10 MG PO TABS: 10.0000 mg | ORAL_TABLET | Freq: Two times a day (BID) | ORAL | Status: DC | PRN

## 2012-12-13 NOTE — Telephone Encounter (Signed)
Pt is wanting a refill on Flexeril. Medication is no longer on medication list. Last seen on 10/25/2012.  Please advise SW//CMA

## 2012-12-13 NOTE — Telephone Encounter (Signed)
Advise patient, I just send a prescription to his pharmacy, to take twice a day as needed, watch for somnolence.

## 2012-12-14 ENCOUNTER — Telehealth: Payer: Self-pay | Admitting: *Deleted

## 2012-12-14 NOTE — Telephone Encounter (Signed)
Discussed with patient, verbalized understanding.  

## 2012-12-14 NOTE — Telephone Encounter (Signed)
Spoke with patient and advise patient that Rx was sent to pharmacy. Pt was aware  to take twice a day as needed, watch for somnolence.  SW///CMA

## 2012-12-17 ENCOUNTER — Ambulatory Visit (INDEPENDENT_AMBULATORY_CARE_PROVIDER_SITE_OTHER): Payer: BC Managed Care – PPO | Admitting: Internal Medicine

## 2012-12-17 VITALS — BP 130/55 | HR 75 | Temp 98.1°F | Wt 201.0 lb

## 2012-12-17 DIAGNOSIS — F419 Anxiety disorder, unspecified: Secondary | ICD-10-CM

## 2012-12-17 DIAGNOSIS — I2510 Atherosclerotic heart disease of native coronary artery without angina pectoris: Secondary | ICD-10-CM

## 2012-12-17 DIAGNOSIS — F341 Dysthymic disorder: Secondary | ICD-10-CM

## 2012-12-17 MED ORDER — CITALOPRAM HYDROBROMIDE 20 MG PO TABS: 30.0000 mg | ORAL_TABLET | Freq: Every day | ORAL | Status: DC

## 2012-12-17 NOTE — Assessment & Plan Note (Signed)
Occasional palpitations without chest pain, usually the day after he drinks wine. EKG today sinus rhythm. Recommend observation, will see his cardiologist soon.

## 2012-12-17 NOTE — Assessment & Plan Note (Signed)
See history of present illness, very good response to citalopram. Has not gone counseling but has not rule that out. I again encouraged him to decrease wine intake to no more than 1 or 2 servings a day. Plan: Refill meds, return to the office in 4-5 months, sooner if needed.

## 2012-12-17 NOTE — Progress Notes (Signed)
  Subjective:    Patient ID: Dakota Gibbs, male    DOB: Feb 11, 1951, 62 y.o.   MRN: [id number]  HPI Followup from previous visit He was seen with severe anxiety and depression, started Celexa, is doing much better. Temporarily, self increased to 40 mg of citalopram but he felt sleepy, he went back to 30 mg and is doing well. Has not reached for counseling, " I haven't find the right person"  Past Medical History  Diagnosis Date  . CAD (coronary artery disease)       Dr Tresa Endo, s/p stents----- sees cards yearly   . Myocardial infarction 1997  . GERD (gastroesophageal reflux disease)   . Allergy     enviornmental  . History of shingles 2009  . Cancer     basal cell; carcinoma removed x3 and SCC  . Back pain     L4-L5 bulging disc, L5-S1 - bulging disc, pinched nerve in neck  . Diabetes mellitus     pt denies  . Anxiety and depression 10/25/2012   Past Surgical History  Procedure Laterality Date  . Hernia repair  1951    RIGHT  . Angioplasty  1997  . Knee arthroscopy  2001    left knee  . Wrist surgery  1984    right  . Basel cell     History   Social History  . Marital Status: Married    Spouse Name: N/A    Number of Children: 2  . Years of Education: N/A   Occupational History  . executive recruter     Social History Main Topics  . Smoking status: Former Smoker    Quit date: 03/19/1992  . Smokeless tobacco: Never Used  . Alcohol Use: Yes     Comment:  5 glasses wine / night  . Drug Use: No  . Sexual Activity: Not on file   Other Topics Concern  . Not on file   Social History Narrative                    Review of Systems Mood is better, going shopping, taking trips with the family, feeling really well. Still drinking, much less than before, ~ 3 or even 4 glasses of wine sometimes. Usually the day after he has wine he has palpitations without chest pain, shortness of breath, nausea or diaphoresis.     Objective:   Physical Exam General --  alert, well-developed, NAD.  Lungs -- normal respiratory effort, no intercostal retractions, no accessory muscle use, and normal breath sounds.  Heart-- normal rate, regular rhythm, no murmur.   Psych-- Cognition and judgment appear intact. Alert and cooperative with normal attention span and concentration. not anxious appearing and not depressed appearing.      Assessment & Plan:

## 2012-12-17 NOTE — Patient Instructions (Addendum)
  Next visit in 4-5 months  for a routine office visit Please make an appointment before you leave the office today (or call few weeks in advance)

## 2012-12-18 ENCOUNTER — Encounter: Payer: Self-pay | Admitting: Internal Medicine

## 2013-03-10 ENCOUNTER — Other Ambulatory Visit: Payer: Self-pay

## 2013-03-16 ENCOUNTER — Encounter: Payer: Self-pay | Admitting: Internal Medicine

## 2013-03-16 ENCOUNTER — Ambulatory Visit (HOSPITAL_BASED_OUTPATIENT_CLINIC_OR_DEPARTMENT_OTHER)
Admission: RE | Admit: 2013-03-16 | Discharge: 2013-03-16 | Disposition: A | Discharge status: Home / Self Care | Payer: BC Managed Care – PPO | Source: Ambulatory Visit | Attending: Internal Medicine | Admitting: Internal Medicine

## 2013-03-16 ENCOUNTER — Ambulatory Visit (INDEPENDENT_AMBULATORY_CARE_PROVIDER_SITE_OTHER): Payer: BC Managed Care – PPO | Admitting: Internal Medicine

## 2013-03-16 VITALS — BP 145/71 | HR 57 | Temp 98.4°F | Wt 206.0 lb

## 2013-03-16 DIAGNOSIS — M25569 Pain in unspecified knee: Secondary | ICD-10-CM | POA: Insufficient documentation

## 2013-03-16 DIAGNOSIS — M1990 Unspecified osteoarthritis, unspecified site: Secondary | ICD-10-CM

## 2013-03-16 DIAGNOSIS — E119 Type 2 diabetes mellitus without complications: Secondary | ICD-10-CM

## 2013-03-16 DIAGNOSIS — Z23 Encounter for immunization: Secondary | ICD-10-CM

## 2013-03-16 DIAGNOSIS — G8929 Other chronic pain: Secondary | ICD-10-CM | POA: Insufficient documentation

## 2013-03-16 DIAGNOSIS — Z0000 Encounter for general adult medical examination without abnormal findings: Secondary | ICD-10-CM

## 2013-03-16 NOTE — Assessment & Plan Note (Signed)
Although I'm not doing a physical exam today the patient is interested in shots. will provide a flu shot and Zostavax. Recommend to return to the office in 2-3  months for a physical, we'll discuss other issues at that time

## 2013-03-16 NOTE — Patient Instructions (Signed)
Next visit in ~ 3 months  for a physical exam . Fasting Please make an appointment    Tylenol  500 mg OTC 2 tabs a day every 8 hours as needed for pain   Get the XR at [facility] [place], corner of [address] (10 minutes form here); they are open 24/7 [address]  [place], [place] [address] [phone]

## 2013-03-16 NOTE — Assessment & Plan Note (Signed)
flu shot  and Zostavax today. Recommend to come back in 3 months for a CPX

## 2013-03-16 NOTE — Assessment & Plan Note (Signed)
Mild left knee swelling, likely DJD. Plan: X-ray, has no pain but still recommend to take Tylenol as needed.

## 2013-03-16 NOTE — Progress Notes (Signed)
Pre visit review using our clinic review tool, if applicable. No additional management support is needed unless otherwise documented below in the visit note. 

## 2013-03-16 NOTE — Assessment & Plan Note (Signed)
History of diabetes, I recommend to check A1c today, patient agrees

## 2013-03-16 NOTE — Progress Notes (Signed)
  Subjective:    Patient ID: Dakota Gibbs, male    DOB: August 03, 1950, 62 y.o.   MRN: [id number]  HPI Acute visit 2 weeks ago noted some swelling in the inner aspect of the left knee. Also has a click there . Wonders about his immunizations, immunizations reviewed, see assessment and plan. I 'm concerned about his history of diabetes and recommend to check a A1c.  Past Medical History  Diagnosis Date  . CAD (coronary artery disease)       Dr Tresa Endo, s/p stents----- sees cards yearly   . Myocardial infarction 1997  . GERD (gastroesophageal reflux disease)   . Allergy     enviornmental  . History of shingles 2009  . Cancer     basal cell; carcinoma removed x3 and SCC  . Back pain     L4-L5 bulging disc, L5-S1 - bulging disc, pinched nerve in neck  . Diabetes mellitus     pt denies  . Anxiety and depression 10/25/2012   Past Surgical History  Procedure Laterality Date  . Hernia repair  1951    RIGHT  . Angioplasty  1997  . Knee arthroscopy  2001    R  . Wrist surgery  1984    right  . Basel cell       Review of Systems No recent injury, no actual knee pain. No lower extremity edema per se    Objective:   Physical Exam BP 145/71  Pulse 57  Temp(Src) 98.4 F (36.9 C)  Wt 206 lb (93.441 kg)  SpO2 97% General -- alert, well-developed, NAD.   Extremities--  no pretibial edema bilaterally  No knee effusion on either side. Right knee with some changes consistent with DJD. Left knee: Inner aspect has a very subtle swelling compared to the other side with consistency of cartilage (no effusion). Range of motion is normal, knee is stable, Neurologic--  alert & oriented X3. Speech normal, gait normal, strength normal in all extremities.  Psych-- Cognition and judgment appear intact. Cooperative with normal attention span and concentration. No anxious appearing , no depressed appearing.      Assessment & Plan:

## 2013-04-19 ENCOUNTER — Other Ambulatory Visit: Payer: Self-pay | Admitting: Internal Medicine

## 2013-04-19 NOTE — Telephone Encounter (Signed)
rx refilled per protocol. DJR  

## 2013-05-02 ENCOUNTER — Telehealth: Payer: Self-pay | Admitting: *Deleted

## 2013-05-02 DIAGNOSIS — E782 Mixed hyperlipidemia: Secondary | ICD-10-CM

## 2013-05-02 DIAGNOSIS — Z79899 Other long term (current) drug therapy: Secondary | ICD-10-CM

## 2013-05-02 DIAGNOSIS — R3911 Hesitancy of micturition: Secondary | ICD-10-CM

## 2013-05-02 DIAGNOSIS — R5381 Other malaise: Secondary | ICD-10-CM

## 2013-05-02 NOTE — Telephone Encounter (Signed)
Pt was called to schedule an appointment and he stated that he needed to have his blood work done before he came to his appointment on the 28th of Jan. He asked that his PSA levels be drawn as well. He wanted it mailed to him.

## 2013-05-02 NOTE — Telephone Encounter (Signed)
RN reviewed paper chart and previous labs ordered: CBC, CMP, Lipid Profile, TSH, PSA.  Labs reordered and lab slip mailed.

## 2013-05-16 ENCOUNTER — Other Ambulatory Visit: Payer: Self-pay | Admitting: Cardiovascular Disease

## 2013-05-17 NOTE — Telephone Encounter (Signed)
Rx(s) sent to pharmacy electronically.  

## 2013-05-19 ENCOUNTER — Other Ambulatory Visit: Payer: BC Managed Care – PPO

## 2013-05-19 LAB — CBC
HCT: 42 % (ref 39.0–52.0)
Hemoglobin: 14.3 g/dL (ref 13.0–17.0)
MCH: 31.8 pg (ref 26.0–34.0)
MCHC: 34 g/dL (ref 30.0–36.0)
MCV: 93.3 fL (ref 78.0–100.0)
Platelets: 234 10*3/uL (ref 150–400)
RBC: 4.5 MIL/uL (ref 4.22–5.81)
RDW: 13.6 % (ref 11.5–15.5)
WBC: 5.7 10*3/uL (ref 4.0–10.5)

## 2013-05-19 LAB — LIPID PANEL
Cholesterol: 157 mg/dL (ref 0–200)
HDL: 62 mg/dL (ref >39)
LDL Cholesterol: 70 mg/dL (ref 0–99)
Total CHOL/HDL Ratio: 2.5 Ratio
Triglycerides: 124 mg/dL (ref <150)
VLDL: 25 mg/dL (ref 0–40)

## 2013-05-19 LAB — COMPREHENSIVE METABOLIC PANEL
ALT: 22 U/L (ref 0–53)
AST: 18 U/L (ref 0–37)
Albumin: 3.8 g/dL (ref 3.5–5.2)
Alkaline Phosphatase: 71 U/L (ref 39–117)
BUN: 15 mg/dL (ref 6–23)
CO2: 25 mEq/L (ref 19–32)
Calcium: 8.9 mg/dL (ref 8.4–10.5)
Chloride: 106 mEq/L (ref 96–112)
Creat: 0.76 mg/dL (ref 0.50–1.35)
Glucose, Bld: 115 mg/dL — ABNORMAL HIGH (ref 70–99)
Potassium: 4.4 mEq/L (ref 3.5–5.3)
Sodium: 139 mEq/L (ref 135–145)
Total Bilirubin: 0.6 mg/dL (ref 0.3–1.2)
Total Protein: 6.7 g/dL (ref 6.0–8.3)

## 2013-05-19 LAB — TSH: TSH: 2.446 u[IU]/mL (ref 0.350–4.500)

## 2013-05-20 LAB — PSA: PSA: 1.12 ng/mL (ref <=4.00)

## 2013-06-01 ENCOUNTER — Ambulatory Visit: Payer: BC Managed Care – PPO | Admitting: Cardiovascular Disease

## 2013-06-08 ENCOUNTER — Encounter: Payer: Self-pay | Admitting: Cardiovascular Disease

## 2013-06-08 ENCOUNTER — Ambulatory Visit (INDEPENDENT_AMBULATORY_CARE_PROVIDER_SITE_OTHER): Payer: BC Managed Care – PPO | Admitting: Cardiovascular Disease

## 2013-06-08 VITALS — BP 140/86 | HR 62 | Ht 70.0 in | Wt 206.4 lb

## 2013-06-08 DIAGNOSIS — E785 Hyperlipidemia, unspecified: Secondary | ICD-10-CM

## 2013-06-08 DIAGNOSIS — H539 Unspecified visual disturbance: Secondary | ICD-10-CM

## 2013-06-08 DIAGNOSIS — R002 Palpitations: Secondary | ICD-10-CM

## 2013-06-08 DIAGNOSIS — F419 Anxiety disorder, unspecified: Secondary | ICD-10-CM

## 2013-06-08 DIAGNOSIS — F32A Depression, unspecified: Secondary | ICD-10-CM

## 2013-06-08 DIAGNOSIS — R0609 Other forms of dyspnea: Secondary | ICD-10-CM

## 2013-06-08 DIAGNOSIS — I2510 Atherosclerotic heart disease of native coronary artery without angina pectoris: Secondary | ICD-10-CM

## 2013-06-08 DIAGNOSIS — R0683 Snoring: Secondary | ICD-10-CM

## 2013-06-08 DIAGNOSIS — R0989 Other specified symptoms and signs involving the circulatory and respiratory systems: Secondary | ICD-10-CM

## 2013-06-08 DIAGNOSIS — F329 Major depressive disorder, single episode, unspecified: Secondary | ICD-10-CM

## 2013-06-08 DIAGNOSIS — F341 Dysthymic disorder: Secondary | ICD-10-CM

## 2013-06-08 DIAGNOSIS — K219 Gastro-esophageal reflux disease without esophagitis: Secondary | ICD-10-CM

## 2013-06-08 NOTE — Progress Notes (Signed)
Patient ID: Dakota Gibbs, male   DOB: 1950-05-29, 63 y.o.   MRN: [id number]     HPI: Dakota Gibbs is a 63 y.o. male who presents to the office today for one-year cardiology evaluation.  Dakota Gibbs 32 is a 63 year old gentleman who suffered in her wall myocardial infarction on 05/10/1995 and underwent acute intervention to a totally occluded right coronary artery. He did have more distal disease in the posterolateral vessel which was treated medically. Several days later he underwent staged intervention to circumflex marginal vessel. His last cardiac catheterization was in 2001 which did not show restenosis and actually showed coronary artery disease regression.  He has been aggressively treated since his initial event.  Recently, he has noticed several episodes in the early morning while sleeping that he develops nocturnal palpitations. Upon further questioning he does snore and his snoring is more significantly abnormal particularly after drinking alcohol. He wakes up one to 2 times per night.  He tells me over the past year he was started on Celexa for anxiety/depression which has helped.  He has a history of hyperlipidemia and has been tolerating Lipitor. He also has GERD, hypertension or progressing recently underwent laboratory 2 weeks ago which showed a fasting glucose of 1:15. He had normal kidney function and liver function studies. Total cholesterol 157 heart was regular and 24 HDL 62 and LDL cholesterol 70. He was not anemic. His PSA was 1.12. Thyroid function study is normal.  Past Medical History  Diagnosis Date  . CAD (coronary artery disease)       Dr Claiborne Billings, s/p stents----- sees cards yearly   . Myocardial infarction 1997  . GERD (gastroesophageal reflux disease)   . Allergy     enviornmental  . History of shingles 2009  . Cancer     basal cell; carcinoma removed x3 and SCC  . Back pain     L4-L5 bulging disc, L5-S1 - bulging disc, pinched nerve in neck    . Diabetes mellitus     pt denies  . Anxiety and depression 10/25/2012    Past Surgical History  Procedure Laterality Date  . Hernia repair  1951    RIGHT  . Angioplasty  1997  . Knee arthroscopy  2001    R  . Wrist surgery  1984    right  . Basel cell      No Known Allergies  Current Outpatient Prescriptions  Medication Sig Dispense Refill  . amLODipine (NORVASC) 5 MG tablet TAKE 1 TABLET BY MOUTH EVERY DAY  90 tablet  0  . aspirin 325 MG tablet Take 325 mg by mouth daily.        Marland Kitchen atorvastatin (LIPITOR) 40 MG tablet Take 40 mg by mouth daily.      . cetirizine (ZYRTEC) 10 MG tablet Take 10 mg by mouth daily.      . citalopram (CELEXA) 20 MG tablet Take 20 mg by mouth daily.      . cyclobenzaprine (FLEXERIL) 10 MG tablet TAKE 1 TABLET (10 MG TOTAL) BY MOUTH 2 (TWO) TIMES DAILY AS NEEDED (FOR PAIN).  30 tablet  1  . lisinopril (PRINIVIL,ZESTRIL) 20 MG tablet TAKE 1 TABLET BY MOUTH DAILY  90 tablet  0  . Multiple Vitamin (MULTIVITAMIN) tablet Take 1 tablet by mouth daily.        . ranitidine (ZANTAC) 150 MG capsule Take 150 mg by mouth every evening.       . sildenafil (VIAGRA) 100 MG tablet  Take 100 mg by mouth daily as needed.        . vitamin C (ASCORBIC ACID) 500 MG tablet Take 500 mg by mouth daily.        . vitamin E 400 UNIT capsule Take 400 Units by mouth daily.        . [DISCONTINUED] metoprolol succinate (TOPROL-XL) 50 MG 24 hr tablet TAKE 1/2 TABLET BY MOUTH TWICE DAILY  90 tablet  0  . nitroGLYCERIN (NITROSTAT) 0.3 MG SL tablet Place 0.3 mg under the tongue every 5 (five) minutes as needed.         No current facility-administered medications for this visit.    History   Social History  . Marital Status: Married    Spouse Name: N/A    Number of Children: 2  . Years of Education: N/A   Occupational History  . executive recruter     Social History Main Topics  . Smoking status: Former Smoker    Quit date: 03/19/1992  . Smokeless tobacco: Never Used  .  Alcohol Use: Yes     Comment:  5 glasses wine / night  . Drug Use: No  . Sexual Activity: Not on file   Other Topics Concern  . Not on file   Social History Narrative                  Social history is notable in that he is married and has 2 children. He now has a dog and walks the dog 1 mile 2 times a day. He is no longer using his treadmill. There is no tobacco use. He does drink alcohol.  Family History  Problem Relation Age of Onset  . Stroke Mother     TIA  . Hypertension Sister   . Prostate cancer      Father and brother   . Coronary artery disease      GM  . Diabetes      B, GM, other fami;ly members   . Colon cancer Neg Hx     ROS is negative for fevers, chills or night sweats.  He denies rash. He states he did experience a "white out" of both eyes. He this was transient. He saw ophthalmologist who raised the possibility that it may have been  embolization from carotid etiology. He denies shortness of breath. He denies episodes of chest pressure. He denies cough or increased wheezing. He denies nausea vomiting or diarrhea. He denies myalgias. He denies claudication. He denies blood in stool or urine. He does admit to transient paresthesias of his fingers and feet. He also did have recent episode of depression and anxiety due to financial issues last year for which he was started on Celexa. He does snore. He wakes up 2-3 times per night. He does note nocturnal palpitations particularly in the early morning suggesting that this has but these are occurring in REM sleep. Other comprehensive 14 point system review is negative.  PE BP 140/86  Pulse 62  Ht 5\' 10"  (1.778 m)  Wt 206 lb 6.4 oz (93.622 kg)  BMI 29.62 kg/m2  General: Alert, oriented, no distress.  Skin: normal turgor, no rashes HEENT: Normocephalic, atraumatic. Pupils round and reactive; sclera anicteric;no lid lag. Extraocular muscles intact. No xanthelasma Nose without nasal septal hypertrophy Mouth/Parynx  benign; Mallinpatti scal 3 Neck: No JVD, no definitive carotid bruits; normal carotid upstroke Lungs: clear to ausculatation and percussion; no wheezing or rales Chest wall: no tenderness to palpitation Heart: RRR,  s1 s2 normal 1/6 systolic murmur. No S3 or S4 gallop. No heave or rub. Abdomen: soft, nontender; no hepatosplenomehaly, BS+; abdominal aorta nontender and not dilated by palpation. Back: no CVA tenderness Pulses 2+ Extremities: no clubbing cyanosis or edema, Homan's sign negative  Neurologic: grossly nonfocal; cranial nerves grossly normal. Psychologic: normal affect and mood.  ECG (independently read by me): Normal sinus rhythm at 62 beats per minute. Small nondiagnostic inferior Q waves. Observed R waves.  LABS:  BMET    Component Value Date/Time   NA 139 05/20/2013 0027   K 4.4 05/20/2013 0027   CL 106 05/20/2013 0027   CO2 25 05/20/2013 0027   GLUCOSE 115* 05/20/2013 0027   GLUCOSE 117 10/31/2009 0000   BUN 15 05/20/2013 0027   CREATININE 0.76 05/20/2013 0027   CREATININE 0.97 10/31/2009 0000   CALCIUM 8.9 05/20/2013 0027   GFRNONAA 72.42 06/29/2009 0826   GFRAA 111 02/21/2008 1503     Hepatic Function Panel     Component Value Date/Time   PROT 6.7 05/20/2013 0027   ALBUMIN 3.8 05/20/2013 0027   AST 18 05/20/2013 0027   ALT 22 05/20/2013 0027   ALKPHOS 71 05/20/2013 0027   BILITOT 0.6 05/20/2013 0027     CBC    Component Value Date/Time   WBC 5.7 05/20/2013 0027   RBC 4.50 05/20/2013 0027   HGB 14.3 05/20/2013 0027   HCT 42.0 05/20/2013 0027   PLT 234 05/20/2013 0027   MCV 93.3 05/20/2013 0027   MCH 31.8 05/20/2013 0027   MCHC 34.0 05/20/2013 0027   RDW 13.6 05/20/2013 0027   LYMPHSABS 2.3 06/29/2009 0826   MONOABS 0.6 06/29/2009 0826   EOSABS 0.3 06/29/2009 0826   BASOSABS 0.0 06/29/2009 0826     BNP No results found for this basename: probnp    Lipid Panel     Component Value Date/Time   CHOL 157 05/20/2013 0027   TRIG 124 05/20/2013 0027   HDL 62 05/20/2013  0027   CHOLHDL 2.5 05/20/2013 0027   VLDL 25 05/20/2013 0027   LDLCALC 70 05/20/2013 0027     RADIOLOGY: No results found.    ASSESSMENT AND PLAN: My impression is that Dakota Gibbs is now 18 years status post his inferior wall myocardial infarction which time he underwent acute intervention to a totally occluded right coronary artery. He also underwent stage intervention to circumflex marginal vessel. His last catheterization was 14 years ago which did show plaque regression on his aggressive medical regimen. I did review his recent laboratory. His cholesterol is excellent on his current dose of atorvastatin. His blood pressure today was 140/86 when taken by the nurse but on repeat by me was 120/80. He's tolerating amlodipine 5 mg in addition to his lisinopril 20 mg and Toprol for blood pressure control. He does note nocturnal palpitations of the early morning raising the possibility of oxygen desaturation associated with REM sleep suggestive of possible obstructive sleep apnea. He does have cardiovascular comorbidities. He does snore and this is significant for drinking alcohol. I am increasing his Toprol to 50 mg at night but he will continue to take 25 mg in the morning. A long discussion with him concerning obstructive sleep apnea and its adverse effect on cardiovascular health. I am scheduling him for a diagnostic polysomnogram for further evaluation. He also recently had these episodes of transient whiteout of his eyes. He underwent a carotid study 4 years ago which did suggest mild plaque. I'm scheduling him for  a followup carotid study to reassess potential progression of carotid disease. I am reducing his aspirin to 81 mg from 325 mg. I will see him back in the office in 2-3 months of upper evaluation followup of the above studies.     Troy Sine, MD, [facility]  06/08/2013 9:33 AM

## 2013-06-08 NOTE — Patient Instructions (Signed)
Your physician has recommended you make the following change in your medication: increase the toprol to 25 mg in the AM and 50 mg in the PM. Decrease the aspirin to 81 mg.  Your physician has requested that you have a carotid duplex. This test is an ultrasound of the carotid arteries in your neck. It looks at blood flow through these arteries that supply the brain with blood. Allow one hour for this exam. There are no restrictions or special instructions.  Your physician has recommended that you have a sleep study. This test records several body functions during sleep, including: brain activity, eye movement, oxygen and carbon dioxide blood levels, heart rate and rhythm, breathing rate and rhythm, the flow of air through your mouth and nose, snoring, body muscle movements, and chest and belly movement.   Your physician recommends that you schedule a follow-up appointment in: 2-3 months.

## 2013-06-15 ENCOUNTER — Encounter (HOSPITAL_COMMUNITY): Payer: BC Managed Care – PPO

## 2013-06-15 ENCOUNTER — Telehealth: Payer: Self-pay | Admitting: *Deleted

## 2013-06-15 NOTE — Telephone Encounter (Signed)
Pt wants to wait until July or after for all his appointments bc he is going to be on Medicare then. He wanted to let you know to postpone his sleep study until then.  TK

## 2013-06-16 NOTE — Telephone Encounter (Signed)
Dr. Claiborne Billings please address and send answer to [facility]

## 2013-06-19 NOTE — Telephone Encounter (Signed)
ok 

## 2013-06-20 NOTE — Telephone Encounter (Signed)
Dakota Gibbs here is Dr. Evette Georges response.

## 2013-08-12 ENCOUNTER — Other Ambulatory Visit: Payer: Self-pay | Admitting: Cardiovascular Disease

## 2013-08-12 ENCOUNTER — Other Ambulatory Visit: Payer: Self-pay

## 2013-08-12 MED ORDER — LISINOPRIL 20 MG PO TABS: 20.0000 mg | ORAL_TABLET | Freq: Every day | ORAL | Status: DC

## 2013-08-12 NOTE — Telephone Encounter (Signed)
Rx was sent to pharmacy electronically. 

## 2013-09-01 ENCOUNTER — Ambulatory Visit: Payer: BC Managed Care – PPO | Admitting: Cardiovascular Disease

## 2013-10-28 ENCOUNTER — Other Ambulatory Visit: Payer: Self-pay | Admitting: Internal Medicine

## 2013-11-01 ENCOUNTER — Other Ambulatory Visit: Payer: Self-pay | Admitting: Internal Medicine

## 2013-11-02 NOTE — Telephone Encounter (Signed)
Caller name: Djon  Call back number: [phone] Pharmacy:  [facility]  Reason for call:   Pt is needing the Celexa to be sent to the mail order and not [facility] since he has changed his insurance and they do not accept Medicare.

## 2013-11-03 MED ORDER — CITALOPRAM HYDROBROMIDE 20 MG PO TABS: ORAL_TABLET | ORAL | Status: DC

## 2013-11-03 NOTE — Telephone Encounter (Signed)
rx sent to [facility] per pt

## 2013-11-14 ENCOUNTER — Telehealth (HOSPITAL_COMMUNITY): Payer: Self-pay | Admitting: *Deleted

## 2013-11-30 ENCOUNTER — Telehealth: Payer: Self-pay

## 2013-11-30 NOTE — Telephone Encounter (Signed)
Spoke with pt about scheduling is CPE with PCP. He will call at a later date to schedule. Pt mentioned that he is on Medicare now.

## 2013-12-06 ENCOUNTER — Ambulatory Visit (HOSPITAL_COMMUNITY)
Admission: RE | Admit: 2013-12-06 | Discharge: 2013-12-06 | Disposition: A | Discharge status: Home / Self Care | Payer: Medicare Other | Source: Ambulatory Visit | Attending: Internal Medicine | Admitting: Internal Medicine

## 2013-12-06 DIAGNOSIS — H539 Unspecified visual disturbance: Secondary | ICD-10-CM | POA: Diagnosis not present

## 2013-12-06 NOTE — Progress Notes (Signed)
Carotid Duplex Completed. °Brianna L Mazza,RVT °

## 2013-12-15 ENCOUNTER — Encounter: Payer: Self-pay | Admitting: *Deleted

## 2014-01-10 DIAGNOSIS — H5200 Hypermetropia, unspecified eye: Secondary | ICD-10-CM | POA: Diagnosis not present

## 2014-01-10 DIAGNOSIS — H524 Presbyopia: Secondary | ICD-10-CM | POA: Diagnosis not present

## 2014-01-10 DIAGNOSIS — E119 Type 2 diabetes mellitus without complications: Secondary | ICD-10-CM | POA: Diagnosis not present

## 2014-01-10 LAB — HM DIABETES EYE EXAM

## 2014-01-16 ENCOUNTER — Telehealth: Payer: Self-pay

## 2014-01-16 NOTE — Telephone Encounter (Signed)
LVM for pt to call back.    RE: Schedule nurse visit for bp recheck.

## 2014-01-30 ENCOUNTER — Telehealth: Payer: Self-pay | Admitting: Cardiovascular Disease

## 2014-01-30 MED ORDER — AMLODIPINE BESYLATE 5 MG PO TABS: 5.0000 mg | ORAL_TABLET | Freq: Every day | ORAL | Status: DC

## 2014-01-30 MED ORDER — ATORVASTATIN CALCIUM 40 MG PO TABS: 40.0000 mg | ORAL_TABLET | Freq: Every day | ORAL | Status: DC

## 2014-01-30 MED ORDER — LISINOPRIL 20 MG PO TABS: 20.0000 mg | ORAL_TABLET | Freq: Every day | ORAL | Status: DC

## 2014-01-30 NOTE — Telephone Encounter (Signed)
Need his prescriptions changed to a new pharmacy. He need his Atorvastatin 40 mg #35mAmlodopine Besylate 5 mg #90 and Lisinopril 20 mg #90. Please call or send to [facility].

## 2014-01-30 NOTE — Telephone Encounter (Signed)
Scripts sent to the pharm

## 2014-03-01 ENCOUNTER — Other Ambulatory Visit: Payer: Self-pay | Admitting: Dermatology

## 2014-03-01 DIAGNOSIS — L570 Actinic keratosis: Secondary | ICD-10-CM | POA: Diagnosis not present

## 2014-03-01 DIAGNOSIS — C44319 Basal cell carcinoma of skin of other parts of face: Secondary | ICD-10-CM | POA: Diagnosis not present

## 2014-03-01 DIAGNOSIS — Z85828 Personal history of other malignant neoplasm of skin: Secondary | ICD-10-CM | POA: Diagnosis not present

## 2014-05-19 ENCOUNTER — Telehealth: Payer: Self-pay | Admitting: Cardiovascular Disease

## 2014-05-19 DIAGNOSIS — E785 Hyperlipidemia, unspecified: Secondary | ICD-10-CM

## 2014-05-19 DIAGNOSIS — E118 Type 2 diabetes mellitus with unspecified complications: Secondary | ICD-10-CM

## 2014-05-19 DIAGNOSIS — R002 Palpitations: Secondary | ICD-10-CM

## 2014-05-19 NOTE — Telephone Encounter (Signed)
Spoke with pt, lab orders mailed to the patients confirmed home address.

## 2014-05-19 NOTE — Telephone Encounter (Signed)
Pt wanted his lab orders to be mailed to him.   Thanks

## 2014-05-19 NOTE — Telephone Encounter (Signed)
Left message for pt to call.

## 2014-05-29 DIAGNOSIS — E785 Hyperlipidemia, unspecified: Secondary | ICD-10-CM | POA: Diagnosis not present

## 2014-05-29 DIAGNOSIS — E118 Type 2 diabetes mellitus with unspecified complications: Secondary | ICD-10-CM | POA: Diagnosis not present

## 2014-05-29 DIAGNOSIS — R002 Palpitations: Secondary | ICD-10-CM | POA: Diagnosis not present

## 2014-05-29 LAB — COMPREHENSIVE METABOLIC PANEL
ALBUMIN: 3.8 g/dL (ref 3.5–5.2)
ALK PHOS: 71 U/L (ref 39–117)
ALT: 18 U/L (ref 0–53)
AST: 17 U/L (ref 0–37)
BUN: 16 mg/dL (ref 6–23)
CALCIUM: 8.9 mg/dL (ref 8.4–10.5)
CHLORIDE: 105 meq/L (ref 96–112)
CO2: 25 meq/L (ref 19–32)
CREATININE: 0.79 mg/dL (ref 0.50–1.35)
Glucose, Bld: 124 mg/dL — ABNORMAL HIGH (ref 70–99)
POTASSIUM: 5.1 meq/L (ref 3.5–5.3)
SODIUM: 139 meq/L (ref 135–145)
Total Bilirubin: 0.7 mg/dL (ref 0.2–1.2)
Total Protein: 6.8 g/dL (ref 6.0–8.3)

## 2014-05-29 LAB — CBC
HCT: 41.7 % (ref 39.0–52.0)
Hemoglobin: 14.3 g/dL (ref 13.0–17.0)
MCH: 31.3 pg (ref 26.0–34.0)
MCHC: 34.3 g/dL (ref 30.0–36.0)
MCV: 91.2 fL (ref 78.0–100.0)
MPV: 9.6 fL (ref 8.6–12.4)
Platelets: 252 10*3/uL (ref 150–400)
RBC: 4.57 MIL/uL (ref 4.22–5.81)
RDW: 13.2 % (ref 11.5–15.5)
WBC: 7.1 10*3/uL (ref 4.0–10.5)

## 2014-05-29 LAB — TSH: TSH: 2.418 u[IU]/mL (ref 0.350–4.500)

## 2014-05-29 LAB — HEMOGLOBIN A1C
Hgb A1c MFr Bld: 6.3 % — ABNORMAL HIGH (ref <5.7)
Mean Plasma Glucose: 134 mg/dL — ABNORMAL HIGH (ref <117)

## 2014-06-13 ENCOUNTER — Encounter: Payer: Self-pay | Admitting: Internal Medicine

## 2014-06-13 ENCOUNTER — Ambulatory Visit (INDEPENDENT_AMBULATORY_CARE_PROVIDER_SITE_OTHER): Payer: Medicare Other | Admitting: Internal Medicine

## 2014-06-13 VITALS — BP 108/64 | HR 55 | Temp 97.9°F | Ht 70.0 in | Wt 211.5 lb

## 2014-06-13 DIAGNOSIS — F329 Major depressive disorder, single episode, unspecified: Secondary | ICD-10-CM

## 2014-06-13 DIAGNOSIS — Z125 Encounter for screening for malignant neoplasm of prostate: Secondary | ICD-10-CM

## 2014-06-13 DIAGNOSIS — Z23 Encounter for immunization: Secondary | ICD-10-CM | POA: Diagnosis not present

## 2014-06-13 DIAGNOSIS — Z0000 Encounter for general adult medical examination without abnormal findings: Secondary | ICD-10-CM

## 2014-06-13 DIAGNOSIS — F418 Other specified anxiety disorders: Secondary | ICD-10-CM | POA: Diagnosis not present

## 2014-06-13 DIAGNOSIS — E118 Type 2 diabetes mellitus with unspecified complications: Secondary | ICD-10-CM

## 2014-06-13 DIAGNOSIS — F419 Anxiety disorder, unspecified: Secondary | ICD-10-CM

## 2014-06-13 DIAGNOSIS — R0683 Snoring: Secondary | ICD-10-CM | POA: Diagnosis not present

## 2014-06-13 DIAGNOSIS — E785 Hyperlipidemia, unspecified: Secondary | ICD-10-CM | POA: Diagnosis not present

## 2014-06-13 DIAGNOSIS — T7840XD Allergy, unspecified, subsequent encounter: Secondary | ICD-10-CM

## 2014-06-13 MED ORDER — CYCLOBENZAPRINE HCL 10 MG PO TABS: ORAL_TABLET | ORAL | Status: DC

## 2014-06-13 MED ORDER — CITALOPRAM HYDROBROMIDE 20 MG PO TABS: 20.0000 mg | ORAL_TABLET | Freq: Every day | ORAL | Status: DC

## 2014-06-13 MED ORDER — AZELASTINE HCL 0.1 % NA SOLN: 1.0000 | Freq: Two times a day (BID) | NASAL | Status: DC

## 2014-06-13 NOTE — Assessment & Plan Note (Addendum)
Patient's wife diagnosed with lung cancer, fortunately she is tolerating the treatment well. Patient is taking citalopram 20 mg daily and that is working well for him. Provided RF

## 2014-06-13 NOTE — Assessment & Plan Note (Signed)
Last A1c satisfactory, continue with lifestyle treatment

## 2014-06-13 NOTE — Progress Notes (Signed)
Pre visit review using our clinic review tool, if applicable. No additional management support is needed unless otherwise documented below in the visit note. 

## 2014-06-13 NOTE — Assessment & Plan Note (Signed)
Request a PSA which is ordered, DRE negative

## 2014-06-13 NOTE — Patient Instructions (Signed)
Stop by the front desk and schedule labs to be done within few days (fasting)  Please come back to the office in 6 months  for a physical exam. Come back fasting

## 2014-06-13 NOTE — Assessment & Plan Note (Signed)
Good compliance of medication, check FLP 

## 2014-06-13 NOTE — Progress Notes (Signed)
Subjective:    Patient ID: Dakota Gibbs, male    DOB: 09-Jun-1950, 64 y.o.   MRN: [id number]  DOS:  06/13/2014 Type of visit - description : rov Interval history: CAD, follow-up by cardiology, recent  blood work  looks good. Question of   sleep apnea, the patient has decrease alcohol intake significantly and is using a nasal breath strip at night, snoring has decreased significantly. Diabetes, well-controlled per last A1c Hypertension, ambulatory BPs 120/80 , 130/80 Anxiety, on citalopram, he is actually taking only 20 mg daily with good control of his symptoms High cholesterol, good compliance of medication, due for a FLP Aches and pains, on Flexeril which he takes infrequently with good results Request a PSA  Review of Systems Denies lack of energy No chest pain, difficulty breathing or lower extremity edema No nausea, vomiting, diarrhea No dysuria, gross hematuria difficulty urinating  Past Medical History  Diagnosis Date  . CAD (coronary artery disease)       Dr Claiborne Billings, s/p stents----- sees cards yearly   . Myocardial infarction 1997  . GERD (gastroesophageal reflux disease)   . Allergy     enviornmental  . History of shingles 2009  . Cancer     basal cell; carcinoma removed x3 and SCC  . Back pain     L4-L5 bulging disc, L5-S1 - bulging disc, pinched nerve in neck  . Diabetes mellitus     pt denies  . Anxiety and depression 10/25/2012    Past Surgical History  Procedure Laterality Date  . Hernia repair  1951    RIGHT  . Angioplasty  1997  . Knee arthroscopy  2001    R  . Wrist surgery  1984    right  . Basel cell      History   Social History  . Marital Status: Married    Spouse Name: N/A    Number of Children: 2  . Years of Education: N/A   Occupational History  . executive recruter     Social History Main Topics  . Smoking status: Former Smoker    Quit date: 03/19/1992  . Smokeless tobacco: Never Used  . Alcohol Use: Yes     Comment:  5  glasses wine / night  . Drug Use: No  . Sexual Activity: Not on file   Other Topics Concern  . Not on file   Social History Narrative                       Medication List       This list is accurate as of: 06/13/14  6:58 PM.  Always use your most recent med list.               amLODipine 5 MG tablet  Commonly known as:  NORVASC  Take 1 tablet (5 mg total) by mouth daily.     aspirin 325 MG tablet  Take 325 mg by mouth daily.     atorvastatin 40 MG tablet  Commonly known as:  LIPITOR  Take 1 tablet (40 mg total) by mouth daily at 6 PM.     azelastine 0.1 % nasal spray  Commonly known as:  ASTELIN  Place 1 spray into both nostrils 2 (two) times daily. Use in each nostril as directed     cetirizine 10 MG tablet  Commonly known as:  ZYRTEC  Take 10 mg by mouth daily.     citalopram 20 MG tablet  Commonly known as:  CELEXA  Take 1 tablet (20 mg total) by mouth daily.     cyclobenzaprine 10 MG tablet  Commonly known as:  FLEXERIL  TAKE 1 TABLET (10 MG TOTAL) BY MOUTH 2 (TWO) TIMES DAILY AS NEEDED (FOR PAIN).     fluorouracil 5 % cream  Commonly known as:  EFUDEX  Apply 1 application topically 2 (two) times daily. FOR 2 WEEKS, WAIT 2 WEEKS....     lisinopril 20 MG tablet  Commonly known as:  PRINIVIL,ZESTRIL  Take 1 tablet (20 mg total) by mouth daily.     metoprolol succinate 50 MG 24 hr tablet  Commonly known as:  TOPROL-XL  Take 0.5 tablet (25 mg total) by mouth every morning and take 1 tablet (50 mg total) by mouth every evening.     multivitamin tablet  Take 1 tablet by mouth daily.     nitroGLYCERIN 0.3 MG SL tablet  Commonly known as:  NITROSTAT  Place 0.3 mg under the tongue every 5 (five) minutes as needed.     ranitidine 150 MG capsule  Commonly known as:  ZANTAC  Take 150 mg by mouth every evening.     sildenafil 100 MG tablet  Commonly known as:  VIAGRA  Take 100 mg by mouth daily as needed.     vitamin C 500 MG tablet  Commonly  known as:  ASCORBIC ACID  Take 500 mg by mouth daily.     vitamin E 400 UNIT capsule  Take 400 Units by mouth daily.           Objective:   Physical Exam  Constitutional: He is oriented to person, place, and time. He appears well-developed. No distress.  HENT:  Head: Normocephalic and atraumatic.  Cardiovascular:  RRR, no murmur, rub or gallop  Pulmonary/Chest: Effort normal. No respiratory distress.  CTA B  Genitourinary:  Rectal: No external abnormalities noted. Normal sphincter tone. No rectal masses or tenderness.  Stool: not found Prostate: Prostate gland firm and smooth, no enlargement, nodularity, tenderness, mass, asymmetry or induration.  Musculoskeletal: Normal range of motion. He exhibits no edema or tenderness.  Neurological: He is alert and oriented to person, place, and time. No cranial nerve deficit. He exhibits normal muscle tone. Coordination normal.  Speech normal, gait unassisted and normal for age, motor strength appropriate for age   Skin: Skin is warm and dry. No pallor.  Psychiatric: He has a normal mood and affect. His behavior is normal. Judgment and thought content normal.  Vitals reviewed.       Assessment & Plan:   Problem List Items Addressed This Visit      Other   Hyperlipidemia with target LDL less than 70    Good compliance of medication, check FLP      Relevant Orders   Lipid panel    Other Visit Diagnoses    Prostate cancer screening        Relevant Orders    PSA

## 2014-06-13 NOTE — Assessment & Plan Note (Addendum)
uses Flexeril as needed, prescription provided

## 2014-06-13 NOTE — Assessment & Plan Note (Signed)
Symptoms significantly decreased after he cut down on alcohol intake and uses a breathing nasal strip  at night. Denies fatigue

## 2014-06-13 NOTE — Assessment & Plan Note (Signed)
Requests prostate cancer screening: DRE negative, check a PSA

## 2014-06-13 NOTE — Assessment & Plan Note (Signed)
Has been using his wife's Astelin with good results, prescription provided

## 2014-06-14 DIAGNOSIS — C4401 Basal cell carcinoma of skin of lip: Secondary | ICD-10-CM | POA: Diagnosis not present

## 2014-06-14 DIAGNOSIS — Z85828 Personal history of other malignant neoplasm of skin: Secondary | ICD-10-CM | POA: Diagnosis not present

## 2014-06-16 ENCOUNTER — Other Ambulatory Visit (INDEPENDENT_AMBULATORY_CARE_PROVIDER_SITE_OTHER): Payer: Medicare Other

## 2014-06-16 DIAGNOSIS — E785 Hyperlipidemia, unspecified: Secondary | ICD-10-CM

## 2014-06-16 DIAGNOSIS — Z125 Encounter for screening for malignant neoplasm of prostate: Secondary | ICD-10-CM | POA: Diagnosis not present

## 2014-06-16 LAB — PSA: PSA: 0.99 ng/mL (ref 0.10–4.00)

## 2014-06-16 LAB — LDL CHOLESTEROL, DIRECT: Direct LDL: 83 mg/dL

## 2014-06-16 LAB — LIPID PANEL
CHOL/HDL RATIO: 3
CHOLESTEROL: 162 mg/dL (ref 0–200)
HDL: 50.7 mg/dL (ref >39.00)
NONHDL: 111.3
Triglycerides: 213 mg/dL — ABNORMAL HIGH (ref 0.0–149.0)
VLDL: 42.6 mg/dL — AB (ref 0.0–40.0)

## 2014-06-19 ENCOUNTER — Telehealth: Payer: Self-pay

## 2014-06-19 ENCOUNTER — Encounter: Payer: Self-pay | Admitting: Internal Medicine

## 2014-06-19 MED ORDER — TIZANIDINE HCL 2 MG PO TABS: 2.0000 mg | ORAL_TABLET | Freq: Three times a day (TID) | ORAL | Status: DC | PRN

## 2014-06-19 NOTE — Telephone Encounter (Signed)
Received Nonformulary drug change request from [facility]. Requesting to change Cyclobenzaprine 10 mg to Tizanidine. Okay per Dr. Larose Kells to change to Tizanidine 2 mg 1 tablet by mouth three times daily as needed. # 90 and 0RFs.    Tizanidine 2 mg sent to [facility].

## 2014-06-26 ENCOUNTER — Encounter: Payer: Self-pay | Admitting: Cardiovascular Disease

## 2014-06-27 ENCOUNTER — Ambulatory Visit (INDEPENDENT_AMBULATORY_CARE_PROVIDER_SITE_OTHER): Payer: Medicare Other | Admitting: Cardiovascular Disease

## 2014-06-27 VITALS — BP 116/66 | HR 46 | Ht 70.0 in | Wt 208.2 lb

## 2014-06-27 DIAGNOSIS — Z79899 Other long term (current) drug therapy: Secondary | ICD-10-CM | POA: Diagnosis not present

## 2014-06-27 DIAGNOSIS — I2510 Atherosclerotic heart disease of native coronary artery without angina pectoris: Secondary | ICD-10-CM | POA: Diagnosis not present

## 2014-06-27 DIAGNOSIS — E785 Hyperlipidemia, unspecified: Secondary | ICD-10-CM

## 2014-06-27 MED ORDER — METOPROLOL SUCCINATE ER 50 MG PO TB24: 50.0000 mg | ORAL_TABLET | Freq: Every day | ORAL | Status: DC

## 2014-06-27 MED ORDER — ASPIRIN EC 81 MG PO TBEC: 81.0000 mg | DELAYED_RELEASE_TABLET | Freq: Every day | ORAL | Status: DC

## 2014-06-27 NOTE — Patient Instructions (Signed)
Your physician has recommended you make the following change in your medication:  Decrease the aspirin to 81 mg. The metoprolol has been changed to 50 mg at night.  Your physician recommends that you return for lab work in: 6 months and Office visit.

## 2014-06-28 ENCOUNTER — Encounter: Payer: Self-pay | Admitting: Cardiovascular Disease

## 2014-06-28 NOTE — Progress Notes (Signed)
Patient ID: Dakota Gibbs, male   DOB: Dec 18, 1950, 64 y.o.   MRN: [id number]     HPI: Dakota Gibbs is a 64 y.o. male who presents to the office today for one-year cardiology evaluation.  Dakota Gibbs suffered an inferior wall myocardial infarction on 05/10/1995 and underwent acute intervention to a totally occluded RCA. He had more distal disease in the posterolateral vessel which was treated medically. Several days later he underwent staged intervention to circumflex marginal vessel. His last cardiac catheterization was in 2001 which did not show restenosis and actually showed coronary artery disease regression.  He has been aggressively treated since his initial event.  When I saw him last year he had noticed several episodes in the early morning while sleeping that he develops nocturnal palpitations. Upon further questioning he does snore and his snoring is more significantly abnormal particularly after drinking alcohol. He wakes up one to 2 times per night.  He tells me over the past year he was started on Celexa for anxiety/depression which has helped.  He has a history of hyperlipidemia and has been tolerating Lipitor. He also has GERD, and hypertension.  Over the past year, he admits to mild weight loss.  He has reduced his alcohol intake, particularly at night, which is reduced his snoring.  He is sleeping  Better.  His previous palpitations have disc appeared when he has been taking 25 mg of metoprolol in the morning and 50 mg at bedtime.  His blood pressure has been well-controlled during the year on amlodipine 5 mg lisinopril 20 mg in addition to his Toprol therapy.  He takes Viagra on an as-needed basis for erectile dysfunction.  He presents for evaluation  Past Medical History  Diagnosis Date  . CAD (coronary artery disease)       Dr Claiborne Billings, s/p stents----- sees cards yearly   . Myocardial infarction 1997  . GERD (gastroesophageal reflux disease)   . Allergy    enviornmental  . History of shingles 2009  . Cancer     basal cell; carcinoma removed x3 and SCC  . Back pain     L4-L5 bulging disc, L5-S1 - bulging disc, pinched nerve in neck  . Diabetes mellitus     pt denies  . Anxiety and depression 10/25/2012    Past Surgical History  Procedure Laterality Date  . Hernia repair  1951    RIGHT  . Angioplasty  1997  . Knee arthroscopy  2001    R  . Wrist surgery  1984    right  . Basel cell    . Doppler echocardiography  07/09/2006    EF 50 to 55 %, LA mildy dilated  . Nm myocar perf wall motion  08/22/2011    Mets 13,low risk study  . Cardiac catheterization      10/04/1999    No Known Allergies  Current Outpatient Prescriptions  Medication Sig Dispense Refill  . amLODipine (NORVASC) 5 MG tablet Take 1 tablet (5 mg total) by mouth daily. 90 tablet 1  . atorvastatin (LIPITOR) 40 MG tablet Take 1 tablet (40 mg total) by mouth daily at 6 PM. 90 tablet 1  . azelastine (ASTELIN) 0.1 % nasal spray Place 1 spray into both nostrils 2 (two) times daily. Use in each nostril as directed 90 mL 1  . cetirizine (ZYRTEC) 10 MG tablet Take 10 mg by mouth daily.    . citalopram (CELEXA) 20 MG tablet Take 1 tablet (20 mg total) by mouth  daily. 90 tablet 1  . fluorouracil (EFUDEX) 5 % cream Apply 1 application topically 2 (two) times daily. FOR 2 WEEKS, WAIT 2 WEEKS....    . lisinopril (PRINIVIL,ZESTRIL) 20 MG tablet Take 1 tablet (20 mg total) by mouth daily. 90 tablet 1  . metoprolol succinate (TOPROL-XL) 50 MG 24 hr tablet Take 1 tablet (50 mg total) by mouth at bedtime. 135 tablet 2  . Multiple Vitamin (MULTIVITAMIN) tablet Take 1 tablet by mouth daily.      . nitroGLYCERIN (NITROSTAT) 0.3 MG SL tablet Place 0.3 mg under the tongue every 5 (five) minutes as needed.      . ranitidine (ZANTAC) 150 MG capsule Take 150 mg by mouth every evening.     . sildenafil (VIAGRA) 100 MG tablet Take 100 mg by mouth daily as needed.      Marland Kitchen tiZANidine (ZANAFLEX) 2 MG  tablet Take 1 tablet (2 mg total) by mouth 3 (three) times daily as needed for muscle spasms. 90 tablet 0  . vitamin C (ASCORBIC ACID) 500 MG tablet Take 500 mg by mouth daily.      . vitamin E 400 UNIT capsule Take 400 Units by mouth daily.      Marland Kitchen aspirin EC 81 MG tablet Take 1 tablet (81 mg total) by mouth daily. 90 tablet 3   No current facility-administered medications for this visit.    History   Social History  . Marital Status: Married    Spouse Name: N/A  . Number of Children: 2  . Years of Education: N/A   Occupational History  . executive recruter     Social History Main Topics  . Smoking status: Former Smoker    Quit date: 03/19/1992  . Smokeless tobacco: Never Used  . Alcohol Use: Yes     Comment:  5 glasses wine / night  . Drug Use: No  . Sexual Activity: Not on file   Other Topics Concern  . Not on file   Social History Narrative                  Social history is notable in that he is married and has 2 children. He now has a dog and walks the dog 1 mile 2 times a day. He is no longer using his treadmill. There is no tobacco use. He does drink alcohol.  Family History  Problem Relation Age of Onset  . Stroke Mother     TIA  . Hypertension Sister   . Prostate cancer      Father and brother   . Coronary artery disease      GM  . Diabetes      B, GM, other fami;ly members   . Colon cancer Neg Hx     ROS General: Negative; No fevers, chills, or night sweats;  HEENT: Negative; No changes in vision or hearing, sinus congestion, difficulty swallowing Pulmonary: Negative; No cough, wheezing, shortness of breath, hemoptysis Cardiovascular: Negative; No chest pain, presyncope, syncope, palpitations GI: Negative; No nausea, vomiting, diarrhea, or abdominal pain GU: Mild erectile dysfunction; No dysuria, hematuria, or difficulty voiding Musculoskeletal: Negative; no myalgias, joint pain, or weakness Hematologic/Oncology: Negative; no easy bruising,  bleeding Endocrine: Negative; no heat/cold intolerance; no diabetes Neuro: Negative; no changes in balance, headaches Skin: Negative; No rashes or skin lesions Psychiatric: Mild depression/anxiety Sleep: Positive for mild snoring, no daytime sleepiness, hypersomnolence, bruxism, restless legs, hypnogognic hallucinations, no cataplexy Other comprehensive 14 point system review is negative.  PE BP 116/66 mmHg  Pulse 46  Ht 5\' 10"  (1.778 m)  Wt 208 lb 3.2 oz (94.439 kg)  BMI 29.87 kg/m2  General: Alert, oriented, no distress.  Skin: normal turgor, no rashes HEENT: Normocephalic, atraumatic. Pupils round and reactive; sclera anicteric;no lid lag. Extraocular muscles intact. No xanthelasma Nose without nasal septal hypertrophy Mouth/Parynx benign; Mallinpatti scal 3 Neck: No JVD, no definitive carotid bruits; normal carotid upstroke Lungs: clear to ausculatation and percussion; no wheezing or rales Chest wall: no tenderness to palpitation Heart: RRR, s1 s2 normal 1/6 systolic murmur. No S3 or S4 gallop. No heave or rub. Abdomen: soft, nontender; no hepatosplenomehaly, BS+; abdominal aorta nontender and not dilated by palpation. Back: no CVA tenderness Pulses 2+ Extremities: no clubbing cyanosis or edema, Homan's sign negative  Neurologic: grossly nonfocal; cranial nerves grossly normal. Psychologic: normal affect and mood.   ECG (independently read by me): Sinus bradycardia 46 bpm.  No ectopy.  February 2015 ECG (independently read by me): Normal sinus rhythm at 62 beats per minute. Small nondiagnostic inferior Q waves. Observed R waves.  LABS:  BMET  BMP Latest Ref Rng 05/29/2014 05/20/2013 10/31/2009  Glucose 70 - 99 mg/dL 124(H) 115(H) 117  BUN 6 - 23 mg/dL 16 15 13   Creatinine 0.50 - 1.35 mg/dL 0.79 0.76 0.97  Sodium 135 - 145 mEq/L 139 139 -  Potassium 3.5 - 5.3 mEq/L 5.1 4.4 -  Chloride 96 - 112 mEq/L 105 106 -  CO2 19 - 32 mEq/L 25 25 -  Calcium 8.4 - 10.5 mg/dL 8.9  8.9 8.9     Hepatic Function Panel     Component Value Date/Time   PROT 6.8 05/29/2014 0852   ALBUMIN 3.8 05/29/2014 0852   AST 17 05/29/2014 0852   ALT 18 05/29/2014 0852   ALKPHOS 71 05/29/2014 0852   BILITOT 0.7 05/29/2014 0852     CBC  CBC Latest Ref Rng 05/29/2014 05/20/2013 10/31/2009  WBC 4.0 - 10.5 K/uL 7.1 5.7 -  Hemoglobin 13.0 - 17.0 g/dL 14.3 14.3 14.8  Hematocrit 39.0 - 52.0 % 41.7 42.0 42.4  Platelets 150 - 400 K/uL 252 234 -     BNP No results found for: PROBNP  Lipid Panel     Component Value Date/Time   CHOL 162 06/16/2014 0858   TRIG 213.0* 06/16/2014 0858   HDL 50.70 06/16/2014 0858   CHOLHDL 3 06/16/2014 0858   VLDL 42.6* 06/16/2014 0858   LDLCALC 70 05/20/2013 0027     RADIOLOGY: No results found.    ASSESSMENT AND PLAN:  Dakota Gibbs is 19 years status post his inferior wall myocardial infarction at which time he underwent acute intervention to a totally occluded RCA and staged intervention to circumflex marginal vessel. His last catheterization was 15 years ago which showed plaque regression on his aggressive medical regimen.  When I saw last year, I discussed sleep apnea as a potential cause for nocturnal palpitations.  His palpitations have improved with taking Toprol-XL 50 mg at bedtime as well, as with reduction in his EtOH use.  He never underwent the sleep study.  He denies any anginal symptoms.  His blood pressure today is controlled on his current medical regimen of amlodipine 5 mg lisinopril 20 mg and Toprol-XL for total dose of 75 mg daily.  His ECG, however, shows significant sinus bradycardia with heart rate of 46.  I have recommended he discontinue his morning dose of Toprol but he will take just the 50 mg at  bedtime.  I reviewed recent laboratory.  His triglycerides and VLDL or increased suggestive of an atherogenic dyslipidemic pattern.  We discussed reduction of carbohydrates and sweets.  He is on atorvastatin 40 mg daily for his  hyperlipidemia.  I again recommended he reduce his aspirin from 325 to just 81 mg.  We discussed increased exercise to mild weight loss with his body mass index of 29.87 kg/m.  In 6 months.  repeat laboratory will be obtained and see him in the office for follow up evaluation.  Time spent: 25 minutes   Troy Sine, MD, [facility]  06/28/2014 7:43 PM

## 2014-07-14 ENCOUNTER — Other Ambulatory Visit: Payer: Self-pay | Admitting: *Deleted

## 2014-07-14 ENCOUNTER — Telehealth: Payer: Self-pay | Admitting: Cardiovascular Disease

## 2014-07-14 MED ORDER — AMLODIPINE BESYLATE 5 MG PO TABS: 5.0000 mg | ORAL_TABLET | Freq: Every day | ORAL | Status: DC

## 2014-07-14 MED ORDER — LISINOPRIL 20 MG PO TABS: 20.0000 mg | ORAL_TABLET | Freq: Every day | ORAL | Status: DC

## 2014-07-14 MED ORDER — METOPROLOL SUCCINATE ER 50 MG PO TB24: 50.0000 mg | ORAL_TABLET | Freq: Every day | ORAL | Status: DC

## 2014-07-14 MED ORDER — ATORVASTATIN CALCIUM 40 MG PO TABS: 40.0000 mg | ORAL_TABLET | Freq: Every day | ORAL | Status: DC

## 2014-07-14 NOTE — Telephone Encounter (Signed)
°  1. Which medications need to be refilled? Lisinopril,Amlodipine,Metoprolol and Atorvastatin  2. Which pharmacy is medication to be sent to?[facility] 3. Do they need a 30 day or 90 day supply? 90 and refills  4. Would they like a call back once the medication has been sent to the pharmacy? yes

## 2014-07-14 NOTE — Telephone Encounter (Signed)
meds refilled at [facility]

## 2014-07-25 ENCOUNTER — Encounter: Payer: Medicare Other | Admitting: Internal Medicine

## 2014-08-18 ENCOUNTER — Encounter: Payer: Self-pay | Admitting: Podiatrist

## 2014-08-18 ENCOUNTER — Ambulatory Visit (INDEPENDENT_AMBULATORY_CARE_PROVIDER_SITE_OTHER): Payer: Medicare Other

## 2014-08-18 ENCOUNTER — Ambulatory Visit (INDEPENDENT_AMBULATORY_CARE_PROVIDER_SITE_OTHER): Payer: Medicare Other | Admitting: Podiatrist

## 2014-08-18 VITALS — BP 117/81 | HR 77 | Resp 12 | Ht 70.0 in | Wt 202.0 lb

## 2014-08-18 DIAGNOSIS — I2510 Atherosclerotic heart disease of native coronary artery without angina pectoris: Secondary | ICD-10-CM | POA: Diagnosis not present

## 2014-08-18 DIAGNOSIS — M79671 Pain in right foot: Secondary | ICD-10-CM | POA: Diagnosis not present

## 2014-08-18 DIAGNOSIS — G629 Polyneuropathy, unspecified: Secondary | ICD-10-CM | POA: Diagnosis not present

## 2014-08-18 DIAGNOSIS — M722 Plantar fascial fibromatosis: Secondary | ICD-10-CM

## 2014-08-18 NOTE — Patient Instructions (Signed)
Plantar Fasciitis (Heel Spur Syndrome) with Rehab The plantar fascia is a fibrous, ligament-like, soft-tissue structure that spans the bottom of the foot. Plantar fasciitis is a condition that causes pain in the foot due to inflammation of the tissue. SYMPTOMS   Pain and tenderness on the underneath side of the foot.  Pain that worsens with standing or walking. CAUSES  Plantar fasciitis is caused by irritation and injury to the plantar fascia on the underneath side of the foot. Common mechanisms of injury include:  Direct trauma to bottom of the foot.  Damage to a small nerve that runs under the foot where the main fascia attaches to the heel bone. Stress placed on the plantar fascia due to any mild increased activity or injury RISK INCREASES WITH:   Obesity.  Poor strength and flexibility.  Improperly fitted shoes.  Tight calf muscles.  Flat feet.  Failure to warm-up properly before activity.  PREVENTION  Warm up and stretch properly before activity.  Strength, flexibility  Maintain a health body weight.  Avoid stress on the plantar fascia.  Wear properly fitted shoes, including arch supports for individuals who have flat feet. PROGNOSIS  If treated properly, then the symptoms of plantar fasciitis usually resolve without surgery. However, occasionally surgery is necessary. RELATED COMPLICATIONS   Recurrent symptoms that may result in a chronic condition.  Problems of the lower back that are caused by compensating for the injury, such as limping.  Pain or weakness of the foot during push-off following surgery.  Chronic inflammation, scarring, and partial or complete fascia tear, occurring more often from repeated injections. TREATMENT  Treatment initially involves the use of ice and medication to help reduce pain and inflammation. The use of strengthening and stretching exercises may help reduce pain with activity, especially stretches of the Achilles tendon.  Your  caregiver may recommend that you use arch supports to help reduce stress on the plantar fascia. Often, corticosteroid injections are given to reduce inflammation. If symptoms persist for greater than 6 months despite non-surgical (conservative), then surgery may be recommended.  MEDICATION   If pain medication is necessary, then nonsteroidal anti-inflammatory medications, such as aspirin and ibuprofen, or other minor pain relievers, such as acetaminophen, are often recommended. Corticosteroid injections may be given by your caregiver.  HEAT AND COLD  Cold treatment (icing) relieves pain and reduces inflammation. Cold treatment should be applied for 10 to 15 minutes every 2 to 3 hours for inflammation and pain and immediately after any activity that aggravates your symptoms. Use ice packs or massage the area with a piece of ice (ice massage).  Heat treatment may be used prior to performing the stretching and strengthening activities prescribed by your caregiver, physical therapist, or athletic trainer. Use a heat pack or soak the injury in warm water. SEEK IMMEDIATE MEDICAL CARE IF:  Treatment seems to offer no benefit, or the condition worsens.  Any medications produce adverse side effects.    EXERCISES-- perform each exercise a total of 10-15 repetitions.  Hold for 30 seconds and perform 3 times per day   RANGE OF MOTION (ROM) AND STRETCHING EXERCISES - Plantar Fasciitis (Heel Spur Syndrome) These exercises may help you when beginning to rehabilitate your injury.   While completing these exercises, remember:   Restoring tissue flexibility helps normal motion to return to the joints. This allows healthier, less painful movement and activity.  An effective stretch should be held for at least 30 seconds.  A stretch should never be painful. You   should only feel a gentle lengthening or release in the stretched tissue. RANGE OF MOTION - Toe Extension, Flexion  Sit with your right / left  leg crossed over your opposite knee.  Grasp your toes and gently pull them back toward the top of your foot. You should feel a stretch on the bottom of your toes and/or foot.  Hold this stretch for __________ seconds.  Now, gently pull your toes toward the bottom of your foot. You should feel a stretch on the top of your toes and or foot.  Hold this stretch for __________ seconds. Repeat __________ times. Complete this stretch __________ times per day.  RANGE OF MOTION - Ankle Dorsiflexion, Active Assisted  Remove shoes and sit on a chair that is preferably not on a carpeted surface.  Place right / left foot under knee. Extend your opposite leg for support.  Keeping your heel down, slide your right / left foot back toward the chair until you feel a stretch at your ankle or calf. If you do not feel a stretch, slide your bottom forward to the edge of the chair, while still keeping your heel down.  Hold this stretch for __________ seconds. Repeat __________ times. Complete this stretch __________ times per day.  STRETCH  Gastroc, Standing  Place hands on wall.  Extend right / left leg, keeping the front knee somewhat bent.  Slightly point your toes inward on your back foot.  Keeping your right / left heel on the floor and your knee straight, shift your weight toward the wall, not allowing your back to arch.  You should feel a gentle stretch in the right / left calf. Hold this position for __________ seconds. Repeat __________ times. Complete this stretch __________ times per day. STRETCH  Soleus, Standing  Place hands on wall.  Extend right / left leg, keeping the other knee somewhat bent.  Slightly point your toes inward on your back foot.  Keep your right / left heel on the floor, bend your back knee, and slightly shift your weight over the back leg so that you feel a gentle stretch deep in your back calf.  Hold this position for __________ seconds. Repeat __________ times.  Complete this stretch __________ times per day. STRETCH  Gastrocsoleus, Standing  Note: This exercise can place a lot of stress on your foot and ankle. Please complete this exercise only if specifically instructed by your caregiver.   Place the ball of your right / left foot on a step, keeping your other foot firmly on the same step.  Hold on to the wall or a rail for balance.  Slowly lift your other foot, allowing your body weight to press your heel down over the edge of the step.  You should feel a stretch in your right / left calf.  Hold this position for __________ seconds.  Repeat this exercise with a slight bend in your right / left knee. Repeat __________ times. Complete this stretch __________ times per day.  STRENGTHENING EXERCISES - Plantar Fasciitis (Heel Spur Syndrome)  These exercises may help you when beginning to rehabilitate your injury. They may resolve your symptoms with or without further involvement from your physician, physical therapist or athletic trainer. While completing these exercises, remember:   Muscles can gain both the endurance and the strength needed for everyday activities through controlled exercises.  Complete these exercises as instructed by your physician, physical therapist or athletic trainer. Progress the resistance and repetitions only as guided.     Neuropathic Pain We often think that pain has a physical cause. If we get rid of the cause, the pain should go away. Nerves themselves can also cause pain. It is called neuropathic pain, which means nerve abnormality. It may be difficult for the patients who have it and for the treating caregivers. Pain is usually described as acute (short-lived) or chronic (long-lasting). Acute pain is related to the physical sensations caused by an injury. It can last from a few seconds to many weeks, but it usually goes away when normal healing occurs. Chronic pain lasts beyond the typical healing time. With neuropathic  pain, the nerve fibers themselves may be damaged or injured. They then send incorrect signals to other pain centers. The pain you feel is real, but the cause is not easy to find.  CAUSES  Chronic pain can result from diseases, such as diabetes and shingles (an infection related to chickenpox), or from trauma, surgery, or amputation. It can also happen without any known injury or disease. The nerves are sending pain messages, even though there is no identifiable cause for such messages.   Other common causes of neuropathy include diabetes, phantom limb pain, or Regional Pain Syndrome (RPS).  As with all forms of chronic back pain, if neuropathy is not correctly treated, there can be a number of associated problems that lead to a downward cycle for the patient. These include depression, sleeplessness, feelings of fear and anxiety, limited social interaction and inability to do normal daily activities or work.  The most dramatic and mysterious example of neuropathic pain is called "phantom limb syndrome." This occurs when an arm or a leg has been removed because of illness or injury. The brain still gets pain messages from the nerves that originally carried impulses from the missing limb. These nerves now seem to misfire and cause troubling pain.  Neuropathic pain often seems to have no cause. It responds poorly to standard pain treatment. Neuropathic pain can occur after:  Shingles (herpes zoster virus infection).  A lasting burning sensation of the skin, caused usually by injury to a peripheral nerve.  Peripheral neuropathy which is widespread nerve damage, often caused by diabetes or alcoholism.  Phantom limb pain following an amputation.  Facial nerve problems (trigeminal neuralgia).  Multiple sclerosis.  Reflex sympathetic dystrophy.  Pain which comes with cancer and cancer chemotherapy.  Entrapment neuropathy such as when pressure is put on a nerve such as in carpal tunnel  syndrome.  Back, leg, and hip problems (sciatica).  Spine or back surgery.  HIV Infection or AIDS where nerves are infected by viruses. Your caregiver can explain items in the above list which may apply to you. SYMPTOMS  Characteristics of neuropathic pain are:  Severe, sharp, electric shock-like, shooting, lightening-like, knife-like.  Pins and needles sensation.  Deep burning, deep cold, or deep ache.  Persistent numbness, tingling, or weakness.  Pain resulting from light touch or other stimulus that would not usually cause pain.  Increased sensitivity to something that would normally cause pain, such as a pinprick. Pain may persist for months or years following the healing of damaged tissues. When this happens, pain signals no longer sound an alarm about current injuries or injuries about to happen. Instead, the alarm system itself is not working correctly.  Neuropathic pain may get worse instead of better over time. For some people, it can lead to serious disability. It is important to be aware that severe injury in a limb can occur without a proper, protective  pain response.Burns, cuts, and other injuries may go unnoticed. Without proper treatment, these injuries can become infected or lead to further disability. Take any injury seriously, and consult your caregiver for treatment. DIAGNOSIS  When you have a pain with no known cause, your caregiver will probably ask some specific questions:   Do you have any other conditions, such as diabetes, shingles, multiple sclerosis, or HIV infection?  How would you describe your pain? (Neuropathic pain is often described as shooting, stabbing, burning, or searing.)  Is your pain worse at any time of the day? (Neuropathic pain is usually worse at night.)  Does the pain seem to follow a certain physical pathway?  Does the pain come from an area that has missing or injured nerves? (An example would be phantom limb pain.)  Is the pain  triggered by minor things such as rubbing against the sheets at night? These questions often help define the type of pain involved. Once your caregiver knows what is happening, treatment can begin. Anticonvulsant, antidepressant drugs, and various pain relievers seem to work in some cases. If another condition, such as diabetes is involved, better management of that disorder may relieve the neuropathic pain.  TREATMENT  Neuropathic pain is frequently long-lasting and tends not to respond to treatment with narcotic type pain medication. It may respond well to other drugs such as antiseizure and antidepressant medications. Usually, neuropathic problems do not completely go away, but partial improvement is often possible with proper treatment. Your caregivers have large numbers of medications available to treat you. Do not be discouraged if you do not get immediate relief. Sometimes different medications or a combination of medications will be tried before you receive the results you are hoping for. See your caregiver if you have pain that seems to be coming from nowhere and does not go away. Help is available.  SEEK IMMEDIATE MEDICAL CARE IF:   There is a sudden change in the quality of your pain, especially if the change is on only one side of the body.  You notice changes of the skin, such as redness, black or purple discoloration, swelling, or an ulcer.  You cannot move the affected limbs. Document Released: 01/17/2004 Document Revised: 07/14/2011 Document Reviewed: 01/17/2004 [facility] Patient Information 2015 [place], [place]. This information is not intended to replace advice given to you by your health care provider. Make sure you discuss any questions you have with your health care provider.

## 2014-08-18 NOTE — Progress Notes (Signed)
   Subjective:    Patient ID: Dakota Gibbs, male    DOB: October 03, 1950, 64 y.o.   MRN: [id number]  HPI  Chief Complaint  Patient presents with  . Peripheral Neuropathy    "I'm diabetic and the last couple of years I've started to feel some numbness in a couple of toes." left 3,4 toes numbness to touch  . Plantar Fasciitis    "I've started to have heel and arch pain." right heel and inferior arch  . Nail Problem    "My left big toe has been sensitive right where my bad toenail is." left 1st medial toenail border        Review of Systems  Neurological: Positive for numbness.       Numbness to B/L hands on and off.  Hematological:       Slow to heal  All other systems reviewed and are negative.      Objective:   Physical Exam  Vascular exam reveals palpable pedal pulses at 1/4 DP and PT bilateral. Capillary refill time is immediate. Neurological sensation decreased with Derrel Nip monofilament at 2 out of 5 sites left 4 out of 5 sites right. Light touch is decreased vibratory sensation is also decreased left great than right.  Dermatological examination revealed intact, supple, moist skin. No interdigital macerations and no pre-ulcerative lesions are seen.  Muscular  skeletal examination reveals plantar fasciitis symptomatology right with pain at the medial arch at insertion of plantar fascia.      Assessment & Plan:   Plantar fasciitis right, neuropathy left,  Plan: injected plantar fascia right, recommended shoe gear changes, stretching exercises left discussed Lyrica will hold off until pain gets worse.

## 2014-08-25 ENCOUNTER — Ambulatory Visit (INDEPENDENT_AMBULATORY_CARE_PROVIDER_SITE_OTHER): Payer: Medicare Other | Admitting: Podiatrist

## 2014-08-25 ENCOUNTER — Encounter: Payer: Self-pay | Admitting: Podiatrist

## 2014-08-25 VITALS — BP 128/59 | HR 60 | Resp 14

## 2014-08-25 DIAGNOSIS — M722 Plantar fascial fibromatosis: Secondary | ICD-10-CM | POA: Diagnosis not present

## 2014-08-25 MED ORDER — METHYLPREDNISOLONE 4 MG PO TBPK: ORAL_TABLET | ORAL | Status: DC

## 2014-08-25 NOTE — Patient Instructions (Signed)
Joint Injection  Care After  Refer to this sheet in the next few days. These instructions provide you with information on caring for yourself after you have had a joint injection. Your caregiver also may give you more specific instructions. Your treatment has been planned according to current medical practices, but problems sometimes occur. Call your caregiver if you have any problems or questions after your procedure.  After any type of joint injection, it is not uncommon to experience:  · Soreness, swelling, or bruising around the injection site.  · Mild numbness, tingling, or weakness around the injection site caused by the numbing medicine used before or with the injection.  It also is possible to experience the following effects associated with the specific agent after injection:  · Iodine-based contrast agents:  ¨ Allergic reaction (itching, hives, widespread redness, and swelling beyond the injection site).  · Corticosteroids (These effects are rare.):  ¨ Allergic reaction.  ¨ Increased blood sugar levels (If you have diabetes and you notice that your blood sugar levels have increased, notify your caregiver).  ¨ Increased blood pressure levels.  ¨ Mood swings.  · Hyaluronic acid in the use of viscosupplementation.  ¨ Temporary heat or redness.  ¨ Temporary rash and itching.  ¨ Increased fluid accumulation in the injected joint.  These effects all should resolve within a day after your procedure.   HOME CARE INSTRUCTIONS  · Limit yourself to light activity the day of your procedure. Avoid lifting heavy objects, bending, stooping, or twisting.  · Take prescription or over-the-counter pain medication as directed by your caregiver.  · You may apply ice to your injection site to reduce pain and swelling the day of your procedure. Ice may be applied 03-04 times:  ¨ Put ice in a plastic bag.  ¨ Place a towel between your skin and the bag.  ¨ Leave the ice on for no longer than 15-20 minutes each time.  SEEK  IMMEDIATE MEDICAL CARE IF:   · Pain and swelling get worse rather than better or extend beyond the injection site.  · Numbness does not go away.  · Blood or fluid continues to leak from the injection site.  · You have chest pain.  · You have swelling of your face or tongue.  · You have trouble breathing or you become dizzy.  · You develop a fever, chills, or severe tenderness at the injection site that last longer than 1 day.  MAKE SURE YOU:  · Understand these instructions.  · Watch your condition.  · Get help right away if you are not doing well or if you get worse.  Document Released: 01/02/2011 Document Revised: 07/14/2011 Document Reviewed: 01/02/2011  [employer]® Patient Information ©2015 [employer]. This information is not intended to replace advice given to you by your health care provider. Make sure you discuss any questions you have with your health care provider.

## 2014-08-25 NOTE — Progress Notes (Signed)
Chief Complaint  Patient presents with  . Foot Pain    "right foot is still hurting after injection,some better at site of the injection but still hurts on the heel,left foot is fine"     HPI: Patient is 64 y.o. male who presents today for continued pain right foot-- he was dissapointed that the injection given last week did not resolve all of his pain.  He no longer has pain on the medial aspect of the foot but does still experience pain on the planter central aspect of the foot and is requesting another injection.     No Known Allergies  Physical Exam  Neurovascular status intact and unchanged.  Pain on palpation plantar centeral asepect of the heel is noted. No pain with medial to lateral compression.  Continued plantar fasciitis symptomatology noted.   Assessment: plantar fasciitis / bursitis right foot.   Plan: did agree on a 2nd injection in the right foot which was performed with 10 mg kenalog and marcaine plain under sterile technique.  Recommended continued stretching exercises and maintain appropriate shoegear.  Not to go barefoot around the house. Marland Kitchen

## 2014-10-17 ENCOUNTER — Other Ambulatory Visit: Payer: Self-pay | Admitting: Internal Medicine

## 2014-10-23 ENCOUNTER — Telehealth: Payer: Self-pay | Admitting: Internal Medicine

## 2014-10-23 NOTE — Telephone Encounter (Signed)
Pt called in regarding refusal on med refill of citalopram. Advised pt refused because requested too early. Order sent 06/13/14 for 90 day supply with 1 refill. Pt should not need refill until August. Pt states he will check qty on hand.

## 2014-10-25 ENCOUNTER — Encounter: Payer: Self-pay | Admitting: Cardiovascular Disease

## 2014-11-15 ENCOUNTER — Encounter: Payer: Self-pay | Admitting: Internal Medicine

## 2014-11-15 ENCOUNTER — Other Ambulatory Visit: Payer: Self-pay | Admitting: Internal Medicine

## 2014-11-15 ENCOUNTER — Other Ambulatory Visit: Payer: Self-pay

## 2014-11-15 MED ORDER — CITALOPRAM HYDROBROMIDE 20 MG PO TABS: 20.0000 mg | ORAL_TABLET | Freq: Every day | ORAL | Status: DC

## 2014-11-15 MED ORDER — TIZANIDINE HCL 2 MG PO TABS: 2.0000 mg | ORAL_TABLET | Freq: Three times a day (TID) | ORAL | Status: DC | PRN

## 2014-11-15 NOTE — Telephone Encounter (Signed)
Okay #90 and 1 refill 

## 2014-11-15 NOTE — Telephone Encounter (Signed)
Pt is requesting refill on Zanaflex.   Last OV: 06/13/2014 Last Fill: 06/19/2014 #90 0RF   Okay to refill?

## 2014-11-16 ENCOUNTER — Other Ambulatory Visit: Payer: Self-pay

## 2014-11-16 MED ORDER — CITALOPRAM HYDROBROMIDE 20 MG PO TABS: 20.0000 mg | ORAL_TABLET | Freq: Every day | ORAL | Status: DC

## 2014-11-24 DIAGNOSIS — L03818 Cellulitis of other sites: Secondary | ICD-10-CM | POA: Diagnosis not present

## 2014-12-05 ENCOUNTER — Other Ambulatory Visit: Payer: Self-pay | Admitting: *Deleted

## 2014-12-05 ENCOUNTER — Encounter: Payer: Self-pay | Admitting: *Deleted

## 2014-12-05 DIAGNOSIS — Z79899 Other long term (current) drug therapy: Secondary | ICD-10-CM

## 2014-12-05 DIAGNOSIS — I2510 Atherosclerotic heart disease of native coronary artery without angina pectoris: Secondary | ICD-10-CM

## 2014-12-05 DIAGNOSIS — E785 Hyperlipidemia, unspecified: Secondary | ICD-10-CM

## 2014-12-12 ENCOUNTER — Encounter: Payer: Medicare Other | Admitting: Internal Medicine

## 2014-12-14 ENCOUNTER — Telehealth: Payer: Self-pay | Admitting: Internal Medicine

## 2014-12-14 NOTE — Telephone Encounter (Signed)
Received VM 8/11 10:34am to schedule appt. Left msg for pt to call us back.

## 2014-12-14 NOTE — Telephone Encounter (Signed)
Patient would like to schedule medicare wellness with specific labs, please advise patient there are certain labs he would like p.

## 2014-12-15 NOTE — Telephone Encounter (Signed)
Left a message for call back.  Pt needs to schedule a medicare wellness visit with Dr. Larose Kells.

## 2014-12-19 NOTE — Telephone Encounter (Signed)
Left a message for call back.  Pt has labs and a CPE scheduled with Dr. Larose Kells.

## 2014-12-20 ENCOUNTER — Ambulatory Visit: Payer: Medicare Other | Admitting: Cardiovascular Disease

## 2014-12-26 ENCOUNTER — Other Ambulatory Visit: Payer: Self-pay

## 2014-12-26 ENCOUNTER — Other Ambulatory Visit (INDEPENDENT_AMBULATORY_CARE_PROVIDER_SITE_OTHER): Payer: Medicare Other

## 2014-12-26 DIAGNOSIS — E119 Type 2 diabetes mellitus without complications: Secondary | ICD-10-CM

## 2014-12-26 DIAGNOSIS — I2510 Atherosclerotic heart disease of native coronary artery without angina pectoris: Secondary | ICD-10-CM | POA: Diagnosis not present

## 2014-12-26 DIAGNOSIS — E785 Hyperlipidemia, unspecified: Secondary | ICD-10-CM | POA: Diagnosis not present

## 2014-12-26 LAB — BASIC METABOLIC PANEL
BUN: 18 mg/dL (ref 6–23)
CALCIUM: 9.4 mg/dL (ref 8.4–10.5)
CO2: 29 mEq/L (ref 19–32)
Chloride: 104 mEq/L (ref 96–112)
Creatinine, Ser: 0.87 mg/dL (ref 0.40–1.50)
GFR: 93.28 mL/min (ref >60.00)
GLUCOSE: 123 mg/dL — AB (ref 70–99)
Potassium: 5 mEq/L (ref 3.5–5.1)
SODIUM: 139 meq/L (ref 135–145)

## 2014-12-26 LAB — HEMOGLOBIN A1C: HEMOGLOBIN A1C: 6 % (ref 4.6–6.5)

## 2014-12-27 LAB — COMPREHENSIVE METABOLIC PANEL
ALT: 16 U/L (ref 9–46)
AST: 18 U/L (ref 10–35)
Albumin: 3.9 g/dL (ref 3.6–5.1)
Alkaline Phosphatase: 78 U/L (ref 40–115)
BUN: 19 mg/dL (ref 7–25)
CALCIUM: 8.9 mg/dL (ref 8.6–10.3)
CO2: 25 mmol/L (ref 20–31)
Chloride: 104 mmol/L (ref 98–110)
Creat: 0.86 mg/dL (ref 0.70–1.25)
GLUCOSE: 118 mg/dL — AB (ref 65–99)
Potassium: 4.9 mmol/L (ref 3.5–5.3)
Sodium: 138 mmol/L (ref 135–146)
Total Bilirubin: 0.6 mg/dL (ref 0.2–1.2)
Total Protein: 6.7 g/dL (ref 6.1–8.1)

## 2014-12-27 LAB — LIPID PANEL
CHOL/HDL RATIO: 2 ratio (ref <=5.0)
CHOLESTEROL: 149 mg/dL (ref 125–200)
HDL: 73 mg/dL (ref >=40)
LDL Cholesterol: 58 mg/dL (ref <130)
Triglycerides: 90 mg/dL (ref <150)
VLDL: 18 mg/dL (ref <30)

## 2015-01-01 ENCOUNTER — Ambulatory Visit: Payer: Medicare Other | Admitting: Cardiovascular Disease

## 2015-01-02 ENCOUNTER — Ambulatory Visit: Payer: Medicare Other | Admitting: Cardiovascular Disease

## 2015-01-25 DIAGNOSIS — Z85828 Personal history of other malignant neoplasm of skin: Secondary | ICD-10-CM | POA: Diagnosis not present

## 2015-01-25 DIAGNOSIS — L570 Actinic keratosis: Secondary | ICD-10-CM | POA: Diagnosis not present

## 2015-01-25 DIAGNOSIS — L821 Other seborrheic keratosis: Secondary | ICD-10-CM | POA: Diagnosis not present

## 2015-01-25 DIAGNOSIS — D225 Melanocytic nevi of trunk: Secondary | ICD-10-CM | POA: Diagnosis not present

## 2015-02-01 DIAGNOSIS — E119 Type 2 diabetes mellitus without complications: Secondary | ICD-10-CM | POA: Diagnosis not present

## 2015-02-01 DIAGNOSIS — H25013 Cortical age-related cataract, bilateral: Secondary | ICD-10-CM | POA: Diagnosis not present

## 2015-02-01 LAB — HM DIABETES EYE EXAM

## 2015-02-21 ENCOUNTER — Encounter: Payer: Self-pay | Admitting: Internal Medicine

## 2015-02-21 ENCOUNTER — Ambulatory Visit (INDEPENDENT_AMBULATORY_CARE_PROVIDER_SITE_OTHER): Payer: Medicare Other | Admitting: Internal Medicine

## 2015-02-21 VITALS — BP 120/58 | HR 53 | Temp 98.1°F | Ht 70.0 in | Wt 186.2 lb

## 2015-02-21 DIAGNOSIS — Z23 Encounter for immunization: Secondary | ICD-10-CM

## 2015-02-21 DIAGNOSIS — F418 Other specified anxiety disorders: Secondary | ICD-10-CM | POA: Diagnosis not present

## 2015-02-21 DIAGNOSIS — E118 Type 2 diabetes mellitus with unspecified complications: Secondary | ICD-10-CM

## 2015-02-21 DIAGNOSIS — F329 Major depressive disorder, single episode, unspecified: Secondary | ICD-10-CM

## 2015-02-21 DIAGNOSIS — I2510 Atherosclerotic heart disease of native coronary artery without angina pectoris: Secondary | ICD-10-CM | POA: Diagnosis not present

## 2015-02-21 DIAGNOSIS — Z09 Encounter for follow-up examination after completed treatment for conditions other than malignant neoplasm: Secondary | ICD-10-CM | POA: Insufficient documentation

## 2015-02-21 DIAGNOSIS — Z125 Encounter for screening for malignant neoplasm of prostate: Secondary | ICD-10-CM | POA: Insufficient documentation

## 2015-02-21 DIAGNOSIS — Z0000 Encounter for general adult medical examination without abnormal findings: Secondary | ICD-10-CM

## 2015-02-21 DIAGNOSIS — F419 Anxiety disorder, unspecified: Principal | ICD-10-CM

## 2015-02-21 MED ORDER — NITROGLYCERIN 0.3 MG SL SUBL: 0.3000 mg | SUBLINGUAL_TABLET | SUBLINGUAL | Status: DC | PRN

## 2015-02-21 NOTE — Assessment & Plan Note (Signed)
Td  2012 zostavax 2014  prevnar 02-2015 pnm 23: next year Flu shot today Cscope 2013, 1 polyp, 5 years  +FH prostate ca Father 66 y/o, brother age 24: DRE and PSA this year (-) Diet and exercise discussed

## 2015-02-21 NOTE — Progress Notes (Signed)
Pre visit review using our clinic review tool, if applicable. No additional management support is needed unless otherwise documented below in the visit note. 

## 2015-02-21 NOTE — Progress Notes (Signed)
Subjective:    Patient ID: Dakota Gibbs, male    DOB: 03-Mar-1951, 64 y.o.   MRN: [id number]  DOS:  02/21/2015 Type of visit - description :  Here for Medicare AWV:  1. Risk factors based on Past M, S, F history: reviewed 2. Physical Activities:  Takes walks daily, 2 miles  3. Depression/mood: neg screening, on citalopram   4. Hearing:  No problems noted or reported  5. ADL's: independent, drives  6. Fall Risk: no recent falls, prevention discussed , see AVS 7. home Safety: does feel safe at home  8. Height, weight, & visual acuity: see VS, sees eye doctor regularly, had a recent visit, early Macular problems 9. Counseling: provided 10. Labs ordered based on risk factors: if needed  11. Referral Coordination: if needed 12. Care Plan, see assessment and plan , written personalized plan provided , see AVS 13. Cognitive Assessment: motor skills and cognition appropriate for age 44. Care team updated  15. End-of-life care discussed , has a HC-POA  In addition, today we discussed the following: DM: Has significantly changed his diet, CBGs in the morning went from the 115 is 290. Weight has decrease by at least 15 pounds CAD: Asymptomatic, needs a refill of nitroglycerin but  has never used High cholesterol: Good compliance with Lipitor GERD: On Zantac, asymptomatic Planta fasciitis: Better after a local injection, symptoms are coming back despite stretching    Review of Systems   Constitutional: No fever. No chills. No unexplained wt changes. No unusual sweats  HEENT: No dental problems, no ear discharge, no facial swelling, no voice changes. No eye discharge, no eye  redness , no  intolerance to light   Respiratory: No wheezing , no  difficulty breathing. No cough , no mucus production  Cardiovascular: No CP, no leg swelling , no  Palpitations  GI: no nausea, no vomiting, no diarrhea , no  abdominal pain.  No blood in the stools. No dysphagia, no odynophagia      Endocrine: No polyphagia, no polyuria , no polydipsia  GU: No dysuria, gross hematuria, difficulty urinating. No urinary urgency, no frequency.  Musculoskeletal: No joint swellings or unusual aches or pains  Skin: No change in the color of the skin, palor , no  Rash  Allergic, immunologic: No environmental allergies , no  food allergies  Neurological: No dizziness no  syncope. No headaches. No diplopia, no slurred, no slurred speech, no motor deficits, no facial  Numbness  Hematological: No enlarged lymph nodes, no easy bruising , no unusual bleedings  Psychiatry: No suicidal ideas, no hallucinations, no beavior problems, no confusion.  No unusual/severe anxiety, no depression    Past Medical History  Diagnosis Date  . CAD (coronary artery disease)       Dr Claiborne Billings, s/p stents----- sees cards yearly   . Myocardial infarction ([place]) 1997  . GERD (gastroesophageal reflux disease)   . Allergy     enviornmental  . History of shingles 2009  . Cancer ([place])     basal cell; carcinoma removed x3 and SCC  . Back pain     L4-L5 bulging disc, L5-S1 - bulging disc, pinched nerve in neck  . Diabetes mellitus     pt denies  . Anxiety and depression 10/25/2012    Past Surgical History  Procedure Laterality Date  . Hernia repair  1951    RIGHT  . Angioplasty  1997  . Knee arthroscopy  2001    R  . Wrist  surgery  1984    right  . Basel cell    . Doppler echocardiography  07/09/2006    EF 50 to 55 %, LA mildy dilated  . Nm myocar perf wall motion  08/22/2011    Mets 13,low risk study  . Cardiac catheterization      10/04/1999    Social History   Social History  . Marital Status: Married    Spouse Name: N/A  . Number of Children: 2  . Years of Education: N/A   Occupational History  . executive recruter     Social History Main Topics  . Smoking status: Former Smoker    Quit date: 03/19/1992  . Smokeless tobacco: Never Used  . Alcohol Use: Yes     Comment:  5 glasses wine /  night  . Drug Use: No  . Sexual Activity: Not on file   Other Topics Concern  . Not on file   Social History Narrative   Lives w/ wife                 Family History  Problem Relation Age of Onset  . Stroke Mother     TIA  . Hypertension Sister   . Prostate cancer Father     Father and brother   . Coronary artery disease Father     GM  . Diabetes Brother     B, GM, other fami;ly members   . Colon cancer Neg Hx        Medication List       This list is accurate as of: 02/21/15  1:06 PM.  Always use your most recent med list.               amLODipine 5 MG tablet  Commonly known as:  NORVASC  Take 1 tablet (5 mg total) by mouth daily.     aspirin EC 81 MG tablet  Take 1 tablet (81 mg total) by mouth daily.     atorvastatin 40 MG tablet  Commonly known as:  LIPITOR  Take 1 tablet (40 mg total) by mouth daily at 6 PM.     azelastine 0.1 % nasal spray  Commonly known as:  ASTELIN  Place 1 spray into both nostrils 2 (two) times daily. Use in each nostril as directed     cetirizine 10 MG tablet  Commonly known as:  ZYRTEC  Take 10 mg by mouth daily.     citalopram 20 MG tablet  Commonly known as:  CELEXA  Take 1 tablet (20 mg total) by mouth daily.     fluorouracil 5 % cream  Commonly known as:  EFUDEX  Apply 1 application topically 2 (two) times daily. FOR 2 WEEKS, WAIT 2 WEEKS....     lisinopril 20 MG tablet  Commonly known as:  PRINIVIL,ZESTRIL  Take 1 tablet (20 mg total) by mouth daily.     Lutein 10 MG Tabs  Take 30 mg by mouth daily.     metoprolol succinate 50 MG 24 hr tablet  Commonly known as:  TOPROL-XL  Take 1 tablet (50 mg total) by mouth at bedtime.     multivitamin tablet  Take 1 tablet by mouth daily.     nitroGLYCERIN 0.3 MG SL tablet  Commonly known as:  NITROSTAT  Place 1 tablet (0.3 mg total) under the tongue every 5 (five) minutes as needed.     ranitidine 150 MG capsule  Commonly known as:  ZANTAC  Take 150  mg by  mouth every evening.     sildenafil 100 MG tablet  Commonly known as:  VIAGRA  Take 100 mg by mouth daily as needed.     tiZANidine 2 MG tablet  Commonly known as:  ZANAFLEX  Take 1 tablet (2 mg total) by mouth 3 (three) times daily as needed for muscle spasms.     vitamin C 500 MG tablet  Commonly known as:  ASCORBIC ACID  Take 500 mg by mouth daily.     vitamin E 400 UNIT capsule  Take 400 Units by mouth daily.           Objective:   Physical Exam BP 120/58 mmHg  Pulse 53  Temp(Src) 98.1 F (36.7 C) (Oral)  Ht 5\' 10"  (1.778 m)  Wt 186 lb 4 oz (84.482 kg)  BMI 26.72 kg/m2  SpO2 98% General:   Well developed, well nourished . NAD.  Neck:  Full range of motion. Supple. No  Thyromegaly  HEENT:  Normocephalic . Face symmetric, atraumatic Lungs:  CTA B Normal respiratory effort, no intercostal retractions, no accessory muscle use. Heart: RRR,  no murmur.  No pretibial edema bilaterally  Abdomen:  Not distended, soft, non-tender. No rebound or rigidity. No bruit  Skin: Exposed areas without rash. Not pale. Not jaundice Neurologic:  alert & oriented X3.  Speech normal, gait appropriate for age and unassisted Strength symmetric and appropriate for age.  Psych: Cognition and judgment appear intact.  Cooperative with normal attention span and concentration.  Behavior appropriate. No anxious or depressed appearing.    Assessment & Plan:   Assessment > DM CAD MI 1997 Anxiety and depression GERD Snoring, decreased after etoh decreased MSK: Back pain Skin cancer, BCC 3  Plan DM: Definitely improved, A1c is now 6. CAD: Asymptomatic, RF nitroglycerin Anxiety depression:: Doing well on citalopram. Refill as needed. Plantar fasciitis: Encourage stretching,pt  thinking about going back to the doctor who did a local injection Labs reviewed, not due for any tests  RTC 6 months

## 2015-02-21 NOTE — Patient Instructions (Signed)
Next visit  for a routine checkup in 6 months, fasting     (15 minutes) Please schedule an appointment at the front desk      [place] cause injuries and can affect all age groups. It is possible to use preventive measures to significantly decrease the likelihood of falls. There are many simple measures which can make your home safer and prevent falls. OUTDOORS  Repair cracks and edges of walkways and driveways.  Remove high doorway thresholds.  Trim shrubbery on the main path into your home.  Have good outside lighting.  Clear walkways of tools, rocks, debris, and clutter.  Check that handrails are not broken and are securely fastened. Both sides of steps should have handrails.  Have leaves, snow, and ice cleared regularly.  Use sand or salt on walkways during winter months.  In the garage, clean up grease or oil spills. BATHROOM  Install night lights.  Install grab bars by the toilet and in the tub and shower.  Use non-skid mats or decals in the tub or shower.  Place a plastic non-slip stool in the shower to sit on, if needed.  Keep floors dry and clean up all water on the floor immediately.  Remove soap buildup in the tub or shower on a regular basis.  Secure bath mats with non-slip, double-sided rug tape.  Remove throw rugs and tripping hazards from the floors. BEDROOMS  Install night lights.  Make sure a bedside light is easy to reach.  Do not use oversized bedding.  Keep a telephone by your bedside.  Have a firm chair with side arms to use for getting dressed.  Remove throw rugs and tripping hazards from the floor. KITCHEN  Keep handles on pots and pans turned toward the center of the stove. Use back burners when possible.  Clean up spills quickly and allow time for drying.  Avoid walking on wet floors.  Avoid hot utensils and knives.  Position shelves so they are not too high or low.  Place commonly used  objects within easy reach.  If necessary, use a sturdy step stool with a grab bar when reaching.  Keep electrical cables out of the way.  Do not use floor polish or wax that makes floors slippery. If you must use wax, use non-skid floor wax.  Remove throw rugs and tripping hazards from the floor. STAIRWAYS  Never leave objects on stairs.  Place handrails on both sides of stairways and use them. Fix any loose handrails. Make sure handrails on both sides of the stairways are as long as the stairs.  Check carpeting to make sure it is firmly attached along stairs. Make repairs to worn or loose carpet promptly.  Avoid placing throw rugs at the top or bottom of stairways, or properly secure the rug with carpet tape to prevent slippage. Get rid of throw rugs, if possible.  Have an electrician put in a light switch at the top and bottom of the stairs. OTHER FALL PREVENTION TIPS  Wear low-heel or rubber-soled shoes that are supportive and fit well. Wear closed toe shoes.  When using a stepladder, make sure it is fully opened and both spreaders are firmly locked. Do not climb a closed stepladder.  Add color or contrast paint or tape to grab bars and handrails in your home. Place contrasting color strips on first and last steps.  Learn and use mobility aids as needed. Install an electrical emergency response system.  Turn on lights to avoid dark areas. Replace light bulbs that burn out immediately. Get light switches that glow.  Arrange furniture to create clear pathways. Keep furniture in the same place.  Firmly attach carpet with non-skid or double-sided tape.  Eliminate uneven floor surfaces.  Select a carpet pattern that does not visually hide the edge of steps.  Be aware of all pets. OTHER HOME SAFETY TIPS  Set the water temperature for 120 F (48.8 C).  Keep emergency numbers on or near the telephone.  Keep smoke detectors on every level of the home and near sleeping  areas. Document Released: 04/11/2002 Document Revised: 10/21/2011 Document Reviewed: 07/11/2011 [facility] Patient Information 2015 [place], [place]. This information is not intended to replace advice given to you by your health care provider. Make sure you discuss any questions you have with your health care provider.   Preventive Care for Adults Ages 24 and over  Blood pressure check.** / Every 1 to 2 years.  Lipid and cholesterol check.**/ Every 5 years beginning at age 46.  Lung cancer screening. / Every year if you are aged 4-80 years and have a 30-pack-year history of smoking and currently smoke or have quit within the past 15 years. Yearly screening is stopped once you have quit smoking for at least 15 years or develop a health problem that would prevent you from having lung cancer treatment.  Fecal occult blood test (FOBT) of stool. / Every year beginning at age 49 and continuing until age 10. You may not have to do this test if you get a colonoscopy every 10 years.  Flexible sigmoidoscopy** or colonoscopy.** / Every 5 years for a flexible sigmoidoscopy or every 10 years for a colonoscopy beginning at age 67 and continuing until age 48.  Hepatitis C blood test.** / For all people born from [birth year] through 1967 and any individual with known risks for hepatitis C.  Abdominal aortic aneurysm (AAA) screening.** / A one-time screening for ages 45 to 36 years who are current or former smokers.  Skin self-exam. / Monthly.  Influenza vaccine. / Every year.  Tetanus, diphtheria, and acellular pertussis (Tdap/Td) vaccine.** / 1 dose of Td every 10 years.  Varicella vaccine.** / Consult your health care provider.  Zoster vaccine.** / 1 dose for adults aged 19 years or older.  Pneumococcal 13-valent conjugate (PCV13) vaccine.** / Consult your health care provider.  Pneumococcal polysaccharide (PPSV23) vaccine.** / 1 dose for all adults aged 26 years and older.  Meningococcal vaccine.** /  Consult your health care provider.  Hepatitis A vaccine.** / Consult your health care provider.  Hepatitis B vaccine.** / Consult your health care provider.  Haemophilus influenzae type b (Hib) vaccine.** / Consult your health care provider. **Family history and personal history of risk and conditions may change your health care provider's recommendations. Document Released: 06/17/2001 Document Revised: 04/26/2013 Document Reviewed: 09/16/2010 [facility] Patient Information 2015 [place], [place]. This information is not intended to replace advice given to you by your health care provider. Make sure you discuss any questions you have with your health care provider.

## 2015-02-21 NOTE — Assessment & Plan Note (Signed)
DM: Definitely improved, A1c is now 6. CAD: Asymptomatic, RF nitroglycerin Anxiety depression:: Doing well on citalopram. Refill as needed. Plantar fasciitis: Encourage stretching,pt  thinking about going back to the doctor who did a local injection Labs reviewed, not due for any tests  RTC 6 months

## 2015-02-22 ENCOUNTER — Encounter: Payer: Self-pay | Admitting: Internal Medicine

## 2015-02-23 ENCOUNTER — Telehealth: Payer: Self-pay | Admitting: Internal Medicine

## 2015-02-23 MED ORDER — NITROGLYCERIN 0.3 MG SL SUBL: 0.3000 mg | SUBLINGUAL_TABLET | SUBLINGUAL | Status: DC | PRN

## 2015-02-23 NOTE — Telephone Encounter (Signed)
Rx resent.

## 2015-02-23 NOTE — Telephone Encounter (Signed)
Rx

## 2015-02-23 NOTE — Addendum Note (Signed)
Addended by: Wilfrid Lund on: 02/23/2015 03:05 PM   Modules accepted: Orders

## 2015-02-23 NOTE — Telephone Encounter (Signed)
Caller name: Theresa Dohrman  Relationship to patient: Self   Can be reached: [phone]  Pharmacy: [facility], [address] [facility] [address]  Reason for call: Pt called in because he says the quantity on his nitroGLYCERIN  sent for his refill is to many. He says that it should only be a  30 day supply. He would like to have that Rx adjusted if possible.

## 2015-02-26 ENCOUNTER — Encounter: Payer: Self-pay | Admitting: Internal Medicine

## 2015-02-26 ENCOUNTER — Other Ambulatory Visit: Payer: Self-pay

## 2015-02-26 DIAGNOSIS — Z85828 Personal history of other malignant neoplasm of skin: Secondary | ICD-10-CM | POA: Diagnosis not present

## 2015-02-26 DIAGNOSIS — D485 Neoplasm of uncertain behavior of skin: Secondary | ICD-10-CM | POA: Diagnosis not present

## 2015-02-26 MED ORDER — NITROGLYCERIN 0.3 MG SL SUBL: 0.3000 mg | SUBLINGUAL_TABLET | SUBLINGUAL | Status: DC | PRN

## 2015-03-15 ENCOUNTER — Encounter: Payer: Self-pay | Admitting: Cardiovascular Disease

## 2015-03-15 ENCOUNTER — Ambulatory Visit (INDEPENDENT_AMBULATORY_CARE_PROVIDER_SITE_OTHER): Payer: Medicare Other | Admitting: Cardiovascular Disease

## 2015-03-15 VITALS — BP 114/58 | HR 49 | Ht 70.0 in | Wt 183.0 lb

## 2015-03-15 DIAGNOSIS — K219 Gastro-esophageal reflux disease without esophagitis: Secondary | ICD-10-CM

## 2015-03-15 DIAGNOSIS — I2510 Atherosclerotic heart disease of native coronary artery without angina pectoris: Secondary | ICD-10-CM | POA: Diagnosis not present

## 2015-03-15 DIAGNOSIS — E785 Hyperlipidemia, unspecified: Secondary | ICD-10-CM

## 2015-03-15 DIAGNOSIS — R001 Bradycardia, unspecified: Secondary | ICD-10-CM | POA: Diagnosis not present

## 2015-03-15 MED ORDER — NITROGLYCERIN 0.3 MG SL SUBL: 0.3000 mg | SUBLINGUAL_TABLET | SUBLINGUAL | Status: DC | PRN

## 2015-03-15 NOTE — Patient Instructions (Signed)
Your physician wants you to follow-up in: 1 year or sooner if needed. You will receive a reminder letter in the mail two months in advance. If you don't receive a letter, please call our office to schedule the follow-up appointment.   If you need a refill on your cardiac medications before your next appointment, please call your pharmacy.   

## 2015-03-15 NOTE — Progress Notes (Signed)
Patient ID: Dakota Gibbs, male   DOB: Oct 12, 1950, 64 y.o.   MRN: [id number]     HPI: Dakota Gibbs is a 64 y.o. male who presents to the office today for a 9 month cardiology evaluation.  Dakota Gibbs suffered an inferior wall myocardial infarction on 05/10/1995 and underwent acute intervention to a totally occluded RCA. He had more distal disease in the posterolateral vessel which was treated medically. Several days later he underwent staged intervention to circumflex marginal vessel. His last cardiac catheterization was in 2001 which did not show restenosis and actually showed coronary artery disease regression.  He has been aggressively treated since his initial event.  When I saw him 2 years ago he had noticed several episodes in the early morning while sleeping that he develops nocturnal palpitations. Upon further questioning he does snore and his snoring is more significantly abnormal particularly after drinking alcohol. He wakes up one to 2 times per night.  He tells me over the past year he was started on Celexa for anxiety/depression which has helped.  He has a history of hyperlipidemia and has been tolerating Lipitor. He also has GERD, and hypertension.   Over the past year, he admits to a 25 pound weight loss. In contrast to his previous diet many years ago when he tried the [employer], his current diet is eating less along with few carbohydrates. His weight is reduced from 208-180 3 pounds. He is sleeping significantly improved. He is not aware of any snoring.  He continues to take amlodipine 5 mg, lisinopril 20, no grams, Toprol-XL 50 mg for hypertension and CAD.  He is on Zantac 150 mg for dyspepsia/GERD. He has more energy.  Lab work done by his primary physician had shown improvement in his hemoglobin A1c which was now 6.0, improved from one year ago at 6.6.   He denies any episodes of chest pressure.  He denies PND, orthopnea.  He denies change in exercise tolerance. He  presents for evaluation.   Past Medical History  Diagnosis Date  . CAD (coronary artery disease)       Dr Dakota Gibbs, s/p stents----- sees cards yearly   . Myocardial infarction ([place]) 1997  . GERD (gastroesophageal reflux disease)   . Allergy     enviornmental  . History of shingles 2009  . Cancer ([place])     basal cell; carcinoma removed x3 and SCC  . Back pain     L4-L5 bulging disc, L5-S1 - bulging disc, pinched nerve in neck  . Diabetes mellitus     pt denies  . Anxiety and depression 10/25/2012    Past Surgical History  Procedure Laterality Date  . Hernia repair  1951    RIGHT  . Angioplasty  1997  . Knee arthroscopy  2001    R  . Wrist surgery  1984    right  . Basel cell    . Doppler echocardiography  07/09/2006    EF 50 to 55 %, LA mildy dilated  . Nm myocar perf wall motion  08/22/2011    Mets 13,low risk study  . Cardiac catheterization      10/04/1999    No Known Allergies  Current Outpatient Prescriptions  Medication Sig Dispense Refill  . amLODipine (NORVASC) 5 MG tablet Take 1 tablet (5 mg total) by mouth daily. 90 tablet 3  . aspirin EC 81 MG tablet Take 1 tablet (81 mg total) by mouth daily. 90 tablet 3  . atorvastatin (LIPITOR) 40  MG tablet Take 1 tablet (40 mg total) by mouth daily at 6 PM. 90 tablet 3  . azelastine (ASTELIN) 0.1 % nasal spray Place 1 spray into both nostrils 2 (two) times daily. Use in each nostril as directed 90 mL 1  . cetirizine (ZYRTEC) 10 MG tablet Take 10 mg by mouth daily.    . citalopram (CELEXA) 20 MG tablet Take 1 tablet (20 mg total) by mouth daily. 90 tablet 3  . lisinopril (PRINIVIL,ZESTRIL) 20 MG tablet Take 1 tablet (20 mg total) by mouth daily. 90 tablet 3  . Lutein 10 MG TABS Take 30 mg by mouth daily.    . metoprolol succinate (TOPROL-XL) 50 MG 24 hr tablet Take 1 tablet (50 mg total) by mouth at bedtime. 135 tablet 3  . Multiple Vitamin (MULTIVITAMIN) tablet Take 1 tablet by mouth daily.      . nitroGLYCERIN (NITROSTAT)  0.3 MG SL tablet Place 1 tablet (0.3 mg total) under the tongue every 5 (five) minutes as needed. 30 tablet 1  . ranitidine (ZANTAC) 150 MG capsule Take 150 mg by mouth every evening.     . sildenafil (VIAGRA) 100 MG tablet Take 100 mg by mouth daily as needed.      Marland Kitchen tiZANidine (ZANAFLEX) 2 MG tablet Take 1 tablet (2 mg total) by mouth 3 (three) times daily as needed for muscle spasms. 90 tablet 1  . vitamin C (ASCORBIC ACID) 500 MG tablet Take 500 mg by mouth daily.      . vitamin E 400 UNIT capsule Take 400 Units by mouth daily.       No current facility-administered medications for this visit.    Social History   Social History  . Marital Status: Married    Spouse Name: N/A  . Number of Children: 2  . Years of Education: N/A   Occupational History  . executive recruter     Social History Main Topics  . Smoking status: Former Smoker    Quit date: 03/19/1992  . Smokeless tobacco: Never Used  . Alcohol Use: Yes     Comment:  5 glasses wine / night  . Drug Use: No  . Sexual Activity: Not on file   Other Topics Concern  . Not on file   Social History Narrative   Lives w/ wife               Social history is notable in that he is married and has 2 children. He now has a dog and walks the dog 1 mile 2 times a day. He is no longer using his treadmill. There is no tobacco use. He does drink alcohol.  Family History  Problem Relation Age of Onset  . Stroke Mother     TIA  . Hypertension Sister   . Prostate cancer Father     Father and brother   . Coronary artery disease Father     GM  . Diabetes Brother     B, GM, other fami;ly members   . Colon cancer Neg Hx     ROS General: Negative; No fevers, chills, or night sweats;  Positive for purposeful weight loss of 25 pounds. HEENT: Negative; No changes in vision or hearing, sinus congestion, difficulty swallowing Pulmonary: Negative; No cough, wheezing, shortness of breath, hemoptysis Cardiovascular: Negative; No  chest pain, presyncope, syncope, palpitations GI: Negative; No nausea, vomiting, diarrhea, or abdominal pain GU: Mild erectile dysfunction; No dysuria, hematuria, or difficulty voiding Musculoskeletal: Negative; no myalgias, joint  pain, or weakness Hematologic/Oncology: Negative; no easy bruising, bleeding Endocrine: Negative; no heat/cold intolerance; no diabetes Neuro: Negative; no changes in balance, headaches Skin: Negative; No rashes or skin lesions Psychiatric: Mild depression/anxiety Sleep: Positive for mild snoring which has significantly improved with his weight loss. no daytime sleepiness, hypersomnolence, bruxism, restless legs, hypnogognic hallucinations, no cataplexy Other comprehensive 14 point system review is negative.   PE BP 114/58 mmHg  Pulse 49  Ht 5\' 10"  (1.778 m)  Wt 183 lb (83.008 kg)  BMI 26.26 kg/m2   Wt Readings from Last 3 Encounters:  03/15/15 183 lb (83.008 kg)  02/21/15 186 lb 4 oz (84.482 kg)  08/18/14 202 lb (91.627 kg)   General: Alert, oriented, no distress.  Skin: normal turgor, no rashes HEENT: Normocephalic, atraumatic. Pupils round and reactive; sclera anicteric;no lid lag. Extraocular muscles intact. No xanthelasma Nose without nasal septal hypertrophy Mouth/Parynx benign; Mallinpatti scal 3 Neck: No JVD, no definitive carotid bruits; normal carotid upstroke Lungs: clear to ausculatation and percussion; no wheezing or rales Chest wall: no tenderness to palpitation Heart: RRR, s1 s2 normal 1/6 systolic murmur. No S3 or S4 gallop. No heave or rub. Abdomen: soft, nontender; no hepatosplenomehaly, BS+; abdominal aorta nontender and not dilated by palpation. Back: no CVA tenderness Pulses 2+ Extremities: no clubbing cyanosis or edema, Homan's sign negative  Neurologic: grossly nonfocal; cranial nerves grossly normal. Psychologic: normal affect and mood.  ECG (independently read by me):  Sinus bradycardia at 49 bpm.  Small inferior Q waves  with preserved R waves.  February 2016 ECG (independently read by me): Sinus bradycardia 46 bpm.  No ectopy.  February 2015 ECG (independently read by me): Normal sinus rhythm at 62 beats per minute. Small nondiagnostic inferior Q waves. Observed R waves.  LABS:  BMP Latest Ref Rng 12/26/2014 12/26/2014 05/29/2014  Glucose 70 - 99 mg/dL 123(H) 118(H) 124(H)  BUN 6 - 23 mg/dL 18 19 16   Creatinine 0.40 - 1.50 mg/dL 0.87 0.86 0.79  Sodium 135 - 145 mEq/L 139 138 139  Potassium 3.5 - 5.1 mEq/L 5.0 4.9 5.1  Chloride 96 - 112 mEq/L 104 104 105  CO2 19 - 32 mEq/L 29 25 25   Calcium 8.4 - 10.5 mg/dL 9.4 8.9 8.9      Component Value Date/Time   PROT 6.7 12/26/2014 0001   ALBUMIN 3.9 12/26/2014 0001   AST 18 12/26/2014 0001   ALT 16 12/26/2014 0001   ALKPHOS 78 12/26/2014 0001   BILITOT 0.6 12/26/2014 0001    CBC Latest Ref Rng 05/29/2014 05/20/2013 10/31/2009  WBC 4.0 - 10.5 K/uL 7.1 5.7 -  Hemoglobin 13.0 - 17.0 g/dL 14.3 14.3 14.8  Hematocrit 39.0 - 52.0 % 41.7 42.0 42.4  Platelets 150 - 400 K/uL 252 234 -   Lab Results  Component Value Date   MCV 91.2 05/29/2014   MCV 93.3 05/20/2013   MCV 92.6 10/31/2009   Lab Results  Component Value Date   TSH 2.418 05/29/2014   Lab Results  Component Value Date   HGBA1C 6.0 12/26/2014    BNP No results found for: PROBNP  Lipid Panel     Component Value Date/Time   CHOL 149 12/26/2014 0001   TRIG 90 12/26/2014 0001   HDL 73 12/26/2014 0001   CHOLHDL 2.0 12/26/2014 0001   VLDL 18 12/26/2014 0001   LDLCALC 58 12/26/2014 0001     RADIOLOGY: No results found.    ASSESSMENT AND PLAN:  Mr. Grindel is a 64 year  old Caucasian male19 years status post his inferior wall myocardial infarction at which time he underwent acute intervention to a totally occluded RCA and staged intervention to circumflex marginal vessel. His last catheterization in 2001 showed plaque regression on his aggressive medical regimen.   When I last saw him,  he was significantly bradycardic and I reduced his Toprol dose to 50 mg.  He continues to be bradycardic, but his rate has improved.  He remains asymptomatic.  His blood pressure is well controlled on combination therapy with beta blocker, lisinopril and amlodipine.  He is not having any anginal symptomatology.  His lipid studies are excellent with his current dose of atorvastatin.  He is done exceptionally well with a 25 pound weight loss over the past year, which has resulted in significant improvement in his hemoglobin 123456 and metabolic syndrome/potential early diabetes. He remains active. He denies myalgias. His snoring has essentially resolved with his weight loss and his sleep is much more restorative.  As long as he remains stable I will see him in one year for reevaluation.   Time spent: 25 minutes   Troy Sine, MD, [facility]  03/15/2015 5:43 PM

## 2015-03-20 ENCOUNTER — Telehealth: Payer: Self-pay | Admitting: Cardiovascular Disease

## 2015-03-20 MED ORDER — NITROGLYCERIN 0.3 MG SL SUBL: 0.3000 mg | SUBLINGUAL_TABLET | SUBLINGUAL | Status: DC | PRN

## 2015-03-20 NOTE — Telephone Encounter (Signed)
°*  STAT* If patient is at the pharmacy, call can be transferred to refill team.   1. Which medications need to be refilled? (please list name of each medication and dose if known) Nitroglycerin  2. Which pharmacy/location (including street and city if local pharmacy) is medication to be sent to?[facility]  3. Do they need a 30 day or 90 day supply?#25

## 2015-03-20 NOTE — Telephone Encounter (Signed)
Pt's medication was sent to pt's pharmacy requested. Confirmation received.  

## 2015-03-26 ENCOUNTER — Telehealth: Payer: Self-pay | Admitting: Cardiovascular Disease

## 2015-03-26 NOTE — Telephone Encounter (Signed)
Pt called in stating that his prescription for Nitrostat needs to be changed from 0.3 mg to 0.4. This needs to be called in to the [facility] [address].   Thanks

## 2015-03-26 NOTE — Telephone Encounter (Signed)
Returned call to patient he stated he went to pick up NTG 0.3 mg tablets and pharmacist told him he had to get 100 tablets instead of 25 tablets.He said if Dr.Kelly would change to NTG 0.4 mg he could get 25 tablets.Message sent to [facility].

## 2015-03-27 NOTE — Telephone Encounter (Signed)
Please advise 

## 2015-03-27 NOTE — Telephone Encounter (Signed)
°*  STAT* If patient is at the pharmacy, call can be transferred to refill team.   1. Which medications need to be refilled? (please list name of each medication and dose if known) Nitroglycerin 4 mg, instead of 3 mg 2. Which pharmacy/location (including street and city if local pharmacy) [phone]  3. Do they need a 30 day or 90 day supply? 25

## 2015-03-27 NOTE — Telephone Encounter (Signed)
Message sent to [facility] for a order for NTG 0.4 mg instead of 0.3 mg.

## 2015-03-28 MED ORDER — NITROGLYCERIN 0.4 MG SL SUBL: 0.4000 mg | SUBLINGUAL_TABLET | SUBLINGUAL | Status: DC | PRN

## 2015-03-28 NOTE — Telephone Encounter (Signed)
NTG placed for incorrect dose of 0.3 mg.  New Rx sent for NTG 0.4 mg sl prn.

## 2015-03-30 NOTE — Telephone Encounter (Signed)
ok 

## 2015-05-11 ENCOUNTER — Encounter: Payer: Self-pay | Admitting: Cardiovascular Disease

## 2015-05-30 ENCOUNTER — Other Ambulatory Visit: Payer: Self-pay | Admitting: Cardiology

## 2015-05-30 NOTE — Telephone Encounter (Signed)
Rx request sent to pharmacy.  

## 2015-06-18 ENCOUNTER — Encounter: Payer: Self-pay | Admitting: Internal Medicine

## 2015-06-18 MED ORDER — AZELASTINE HCL 0.1 % NA SOLN: 1.0000 | Freq: Two times a day (BID) | NASAL | Status: DC | PRN

## 2015-06-18 NOTE — Telephone Encounter (Signed)
Astelin Rx sent to [facility].

## 2015-06-26 ENCOUNTER — Encounter: Payer: Self-pay | Admitting: Internal Medicine

## 2015-06-26 DIAGNOSIS — Z8042 Family history of malignant neoplasm of prostate: Secondary | ICD-10-CM

## 2015-06-27 ENCOUNTER — Telehealth: Payer: Self-pay | Admitting: Internal Medicine

## 2015-06-27 NOTE — Telephone Encounter (Signed)
PSA ordered

## 2015-06-27 NOTE — Telephone Encounter (Signed)
    Patient Demographics     Patient Name Sex DOB SSN Address Phone    Dakota Gibbs, Dakota Gibbs Male 1951/04/25 [id number] [address] [phone] [facility]) [phone] (Work) *Preferred*      RE: pSA  Received: Today    Allena Napoleon, MD; Damita Dunnings, CMA           Left msg on work # to schedule lab appt.  Called home # and notified wife to have pt call for lab appt.       Previous Messages     ----- Message -----   From: Colon Branch, MD   Sent: 06/27/2015  4:13 PM    To: Louie Boston, CMA  Subject: Oren Bracket-- please enter a order for a PSA DX screening or FH stomach cancer  Kristie: Please call pt and schedule an appointment for the lab

## 2015-06-27 NOTE — Telephone Encounter (Signed)
-----   Message from Colon Branch, MD sent at 06/27/2015  4:13 PM EST ----- Regarding: Oren Bracket-- please enter a order for a PSA DX screening or FH stomach cancer Kristie: Please call pt and  schedule an appointment for the lab

## 2015-06-28 ENCOUNTER — Other Ambulatory Visit (INDEPENDENT_AMBULATORY_CARE_PROVIDER_SITE_OTHER): Payer: Medicare Other

## 2015-06-28 DIAGNOSIS — Z8042 Family history of malignant neoplasm of prostate: Secondary | ICD-10-CM

## 2015-06-28 DIAGNOSIS — Z125 Encounter for screening for malignant neoplasm of prostate: Secondary | ICD-10-CM

## 2015-06-29 LAB — PSA: PSA: 0.65 ng/mL (ref 0.10–4.00)

## 2015-07-23 ENCOUNTER — Encounter: Payer: Self-pay | Admitting: Internal Medicine

## 2015-07-27 ENCOUNTER — Telehealth: Payer: Self-pay

## 2015-07-27 NOTE — Telephone Encounter (Signed)
Called patient to let him know that we have resubmitted his claim with the corrected codes so he should disregard the bill.

## 2015-08-08 ENCOUNTER — Other Ambulatory Visit: Payer: Self-pay | Admitting: Internal Medicine

## 2015-08-23 ENCOUNTER — Ambulatory Visit: Payer: Medicare Other | Admitting: Internal Medicine

## 2015-09-06 ENCOUNTER — Encounter: Payer: Self-pay | Admitting: Internal Medicine

## 2015-11-04 ENCOUNTER — Other Ambulatory Visit: Payer: Self-pay | Admitting: Cardiology

## 2015-11-05 ENCOUNTER — Other Ambulatory Visit: Payer: Self-pay | Admitting: *Deleted

## 2015-11-05 MED ORDER — AZELASTINE HCL 0.1 % NA SOLN: 1.0000 | Freq: Two times a day (BID) | NASAL | Status: DC | PRN

## 2015-11-05 NOTE — Telephone Encounter (Signed)
This note is 2 months old, but I did resend the medication to see if we get a PA fax from [facility], as I do not see previous documentation of such, informed Jessica/SLS 07/03

## 2015-11-05 NOTE — Progress Notes (Signed)
Rx request to pharmacy; will await PA request/SLS 07/03

## 2016-01-21 ENCOUNTER — Other Ambulatory Visit: Payer: Self-pay | Admitting: Internal Medicine

## 2016-01-21 ENCOUNTER — Other Ambulatory Visit: Payer: Self-pay | Admitting: Cardiology

## 2016-01-22 NOTE — Telephone Encounter (Signed)
Rx has been sent to the pharmacy electronically. ° °

## 2016-01-28 ENCOUNTER — Telehealth: Payer: Self-pay | Admitting: Internal Medicine

## 2016-01-28 MED ORDER — CITALOPRAM HYDROBROMIDE 20 MG PO TABS: 20.0000 mg | ORAL_TABLET | Freq: Every day | ORAL | 0 refills | Status: DC

## 2016-01-28 NOTE — Telephone Encounter (Signed)
Caller name: Relationship to patient: Self Can be reached: [phone]  Pharmacy:  [facility] [place] [phone] ([phone] (Fax)     Reason for call: Refill citalopram (CELEXA) 20 MG tablet [id number]

## 2016-01-28 NOTE — Telephone Encounter (Signed)
Last OV 02/2015. Pt has scheduled appt for 02/05/2016, #30 sent to [facility].

## 2016-02-05 ENCOUNTER — Ambulatory Visit (INDEPENDENT_AMBULATORY_CARE_PROVIDER_SITE_OTHER): Payer: Medicare Other | Admitting: Internal Medicine

## 2016-02-05 ENCOUNTER — Encounter: Payer: Self-pay | Admitting: Internal Medicine

## 2016-02-05 VITALS — BP 108/76 | HR 55 | Temp 98.5°F | Resp 14 | Ht 70.0 in | Wt 188.0 lb

## 2016-02-05 DIAGNOSIS — E785 Hyperlipidemia, unspecified: Secondary | ICD-10-CM | POA: Diagnosis not present

## 2016-02-05 DIAGNOSIS — R739 Hyperglycemia, unspecified: Secondary | ICD-10-CM | POA: Diagnosis not present

## 2016-02-05 DIAGNOSIS — E1142 Type 2 diabetes mellitus with diabetic polyneuropathy: Secondary | ICD-10-CM

## 2016-02-05 DIAGNOSIS — F32A Depression, unspecified: Secondary | ICD-10-CM

## 2016-02-05 DIAGNOSIS — Z23 Encounter for immunization: Secondary | ICD-10-CM

## 2016-02-05 DIAGNOSIS — F418 Other specified anxiety disorders: Secondary | ICD-10-CM

## 2016-02-05 DIAGNOSIS — I10 Essential (primary) hypertension: Secondary | ICD-10-CM

## 2016-02-05 DIAGNOSIS — I2510 Atherosclerotic heart disease of native coronary artery without angina pectoris: Secondary | ICD-10-CM

## 2016-02-05 DIAGNOSIS — F419 Anxiety disorder, unspecified: Secondary | ICD-10-CM

## 2016-02-05 DIAGNOSIS — F329 Major depressive disorder, single episode, unspecified: Secondary | ICD-10-CM

## 2016-02-05 LAB — BASIC METABOLIC PANEL
BUN: 20 mg/dL (ref 6–23)
CALCIUM: 8.7 mg/dL (ref 8.4–10.5)
CO2: 33 mEq/L — ABNORMAL HIGH (ref 19–32)
CREATININE: 0.87 mg/dL (ref 0.40–1.50)
Chloride: 101 mEq/L (ref 96–112)
GFR: 92.96 mL/min (ref >60.00)
GLUCOSE: 164 mg/dL — AB (ref 70–99)
Potassium: 4.3 mEq/L (ref 3.5–5.1)
SODIUM: 138 meq/L (ref 135–145)

## 2016-02-05 LAB — CBC WITH DIFFERENTIAL/PLATELET
BASOS PCT: 0.6 % (ref 0.0–3.0)
Basophils Absolute: 0 10*3/uL (ref 0.0–0.1)
EOS ABS: 0.2 10*3/uL (ref 0.0–0.7)
Eosinophils Relative: 2.3 % (ref 0.0–5.0)
HCT: 39.4 % (ref 39.0–52.0)
HEMOGLOBIN: 13.5 g/dL (ref 13.0–17.0)
LYMPHS ABS: 2.3 10*3/uL (ref 0.7–4.0)
Lymphocytes Relative: 30.8 % (ref 12.0–46.0)
MCHC: 34.3 g/dL (ref 30.0–36.0)
MCV: 93 fl (ref 78.0–100.0)
MONO ABS: 0.5 10*3/uL (ref 0.1–1.0)
Monocytes Relative: 6.7 % (ref 3.0–12.0)
NEUTROS PCT: 59.6 % (ref 43.0–77.0)
Neutro Abs: 4.4 10*3/uL (ref 1.4–7.7)
PLATELETS: 236 10*3/uL (ref 150.0–400.0)
RBC: 4.23 Mil/uL (ref 4.22–5.81)
RDW: 13.7 % (ref 11.5–15.5)
WBC: 7.4 10*3/uL (ref 4.0–10.5)

## 2016-02-05 LAB — AST: AST: 18 U/L (ref 0–37)

## 2016-02-05 LAB — HEMOGLOBIN A1C: HEMOGLOBIN A1C: 6.1 % (ref 4.6–6.5)

## 2016-02-05 LAB — ALT: ALT: 17 U/L (ref 0–53)

## 2016-02-05 MED ORDER — CITALOPRAM HYDROBROMIDE 20 MG PO TABS: 20.0000 mg | ORAL_TABLET | Freq: Every day | ORAL | 2 refills | Status: DC

## 2016-02-05 NOTE — Progress Notes (Signed)
Pre visit review using our clinic review tool, if applicable. No additional management support is needed unless otherwise documented below in the visit note. 

## 2016-02-05 NOTE — Progress Notes (Signed)
Subjective:    Patient ID: Dakota Gibbs, male    DOB: [birth year], 65 y.o.   MRN: [id number]  DOS:  02/05/2016 Type of visit - description : rov Interval history: Diabetes: Doing well with diet and exercise, due for A1c Anxiety depression: On citalopram, symptoms controlled CAD: Good compliance of medication, plans to see cardiology soon. Dyslipidemia: Good med  Compliance , no apparent side effects   Review of Systems Complain of numbness, plantar area distally, bilaterally. Left toe #4 is completely numb No chest pain or difficulty breathing No nausea, vomiting, diarrhea  Past Medical History:  Diagnosis Date  . Allergy    enviornmental  . Anxiety and depression 10/25/2012  . Back pain    L4-L5 bulging disc, L5-S1 - bulging disc, pinched nerve in neck  . CAD (coronary artery disease)      Dr Claiborne Billings, s/p stents----- sees cards yearly   . Cancer ([place])    basal cell; carcinoma removed x3 and SCC  . Diabetes mellitus    pt denies  . GERD (gastroesophageal reflux disease)   . History of shingles 2009  . Myocardial infarction 1997    Past Surgical History:  Procedure Laterality Date  . ANGIOPLASTY  1997  . basel cell    . CARDIAC CATHETERIZATION     10/04/1999  . DOPPLER ECHOCARDIOGRAPHY  07/09/2006   EF 50 to 55 %, LA mildy dilated  . HERNIA REPAIR  1951   RIGHT  . KNEE ARTHROSCOPY  2001   R  . NM MYOCAR PERF WALL MOTION  08/22/2011   Mets 13,low risk study  . WRIST SURGERY  1984   right    Social History   Social History  . Marital status: Married    Spouse name: N/A  . Number of children: 2  . Years of education: N/A   Occupational History  . executive recruter     Social History Main Topics  . Smoking status: Former Smoker    Quit date: 03/19/1992  . Smokeless tobacco: Never Used  . Alcohol use Yes     Comment:  5 glasses wine / night  . Drug use: No  . Sexual activity: Not on file   Other Topics Concern  . Not on file   Social History  Narrative   Lives w/ wife                    Medication List       Accurate as of 02/05/16  1:36 PM. Always use your most recent med list.          amLODipine 5 MG tablet Commonly known as:  NORVASC TAKE 1 TABLET EVERY DAY   aspirin EC 81 MG tablet Take 1 tablet (81 mg total) by mouth daily.   atorvastatin 40 MG tablet Commonly known as:  LIPITOR TAKE 1 TABLET EVERY DAY AT 6 PM (NEED APPOINTMENT WITH DR. Claiborne Billings)   azelastine 0.1 % nasal spray Commonly known as:  ASTELIN Place 1 spray into both nostrils 2 (two) times daily as needed for rhinitis or allergies.   cetirizine 10 MG tablet Commonly known as:  ZYRTEC Take 10 mg by mouth daily.   citalopram 20 MG tablet Commonly known as:  CELEXA Take 1 tablet (20 mg total) by mouth daily.   lisinopril 20 MG tablet Commonly known as:  PRINIVIL,ZESTRIL TAKE 1 TABLET EVERY DAY   Lutein 20 MG Tabs Take 20 mg by mouth daily.   metoprolol  succinate 50 MG 24 hr tablet Commonly known as:  TOPROL-XL TAKE 1 TABLET AT BEDTIME   multivitamin tablet Take 1 tablet by mouth daily.   nitroGLYCERIN 0.4 MG SL tablet Commonly known as:  NITROSTAT Place 1 tablet (0.4 mg total) under the tongue every 5 (five) minutes as needed for chest pain.   ranitidine 150 MG capsule Commonly known as:  ZANTAC Take 150 mg by mouth every evening.   sildenafil 100 MG tablet Commonly known as:  VIAGRA Take 100 mg by mouth daily as needed.   tiZANidine 2 MG tablet Commonly known as:  ZANAFLEX Take 1 tablet (2 mg total) by mouth 3 (three) times daily as needed for muscle spasms.   vitamin C 500 MG tablet Commonly known as:  ASCORBIC ACID Take 500 mg by mouth daily.   vitamin E 400 UNIT capsule Take 400 Units by mouth daily.          Objective:   Physical Exam BP 108/76 (BP Location: Left Arm, Patient Position: Sitting, Cuff Size: Normal)   Pulse (!) 55   Temp 98.5 F (36.9 C) (Oral)   Resp 14   Ht 5\' 10"  (1.778 m)   Wt 188 lb  (85.3 kg)   SpO2 99%   BMI 26.98 kg/m  General:   Well developed, well nourished . NAD.  HEENT:  Normocephalic . Face symmetric, atraumatic Lungs:  CTA B Normal respiratory effort, no intercostal retractions, no accessory muscle use. Heart: RRR,  no murmur.  No pretibial edema bilaterally  Skin: Not pale. Not jaundice DIABETIC FEET EXAM: No lower extremity edema Normal pedal pulses bilaterally Skin normal, nails normal, no calluses Pinprick examination of the feet normal. Neurologic:  alert & oriented X3.  Speech normal, gait appropriate for age and unassisted Psych--  Cognition and judgment appear intact.  Cooperative with normal attention span and concentration.  Behavior appropriate. No anxious or depressed appearing.      Assessment & Plan:  Assessment > DM, + neuropathy (numbness) HTN CAD MI 1997 Anxiety and depression GERD Snoring, decreased after etoh decreased MSK: Back pain Skin cancer, BCC 3  Plan: DM: Diet control, check A1c. Has neuropathy (paresthesias, pinprick examination normal). Feet care discussed HTN: Continue lisinopril, Toprol. Check a BMP, CBC High cholesterol: Continue Lipitor, check AST, ALT. He is not fasting but plans to ask cardiology to check a FLP CAD: Asx, continue controlling CV RF. Rec to see cardiology for 1 year check up to, declined referral Anxiety depression: Refill medications, symptoms controlled Primary care: Flu shot today, declined a pneumonia shot. RTC yearly checkup, 6 months

## 2016-02-05 NOTE — Patient Instructions (Signed)
GO TO THE LAB : Get the blood work     GO TO THE FRONT DESK Schedule your next appointment for a  yearly checkup in 6 months    Diabetes and Foot Care Diabetes may cause you to have problems because of poor blood supply (circulation) to your feet and legs. This may cause the skin on your feet to become thinner, break easier, and heal more slowly. Your skin may become dry, and the skin may peel and crack. You may also have nerve damage in your legs and feet causing decreased feeling in them. You may not notice minor injuries to your feet that could lead to infections or more serious problems. Taking care of your feet is one of the most important things you can do for yourself.  HOME CARE INSTRUCTIONS  Wear shoes at all times, even in the house. Do not go barefoot. Bare feet are easily injured.  Check your feet daily for blisters, cuts, and redness. If you cannot see the bottom of your feet, use a mirror or ask someone for help.  Wash your feet with warm water (do not use hot water) and mild soap. Then pat your feet and the areas between your toes until they are completely dry. Do not soak your feet as this can dry your skin.  Apply a moisturizing lotion or petroleum jelly (that does not contain alcohol and is unscented) to the skin on your feet and to dry, brittle toenails. Do not apply lotion between your toes.  Trim your toenails straight across. Do not dig under them or around the cuticle. File the edges of your nails with an emery board or nail file.  Do not cut corns or calluses or try to remove them with medicine.  Wear clean socks or stockings every day. Make sure they are not too tight. Do not wear knee-high stockings since they may decrease blood flow to your legs.  Wear shoes that fit properly and have enough cushioning. To break in new shoes, wear them for just a few hours a day. This prevents you from injuring your feet. Always look in your shoes before you put them on to be sure  there are no objects inside.  Do not cross your legs. This may decrease the blood flow to your feet.  If you find a minor scrape, cut, or break in the skin on your feet, keep it and the skin around it clean and dry. These areas may be cleansed with mild soap and water. Do not cleanse the area with peroxide, alcohol, or iodine.  When you remove an adhesive bandage, be sure not to damage the skin around it.  If you have a wound, look at it several times a day to make sure it is healing.  Do not use heating pads or hot water bottles. They may burn your skin. If you have lost feeling in your feet or legs, you may not know it is happening until it is too late.  Make sure your health care provider performs a complete foot exam at least annually or more often if you have foot problems. Report any cuts, sores, or bruises to your health care provider immediately. SEEK MEDICAL CARE IF:   You have an injury that is not healing.  You have cuts or breaks in the skin.  You have an ingrown nail.  You notice redness on your legs or feet.  You feel burning or tingling in your legs or feet.  You  have pain or cramps in your legs and feet.  Your legs or feet are numb.  Your feet always feel cold. SEEK IMMEDIATE MEDICAL CARE IF:   There is increasing redness, swelling, or pain in or around a wound.  There is a red line that goes up your leg.  Pus is coming from a wound.  You develop a fever or as directed by your health care provider.  You notice a bad smell coming from an ulcer or wound.   This information is not intended to replace advice given to you by your health care provider. Make sure you discuss any questions you have with your health care provider.   Document Released: 04/18/2000 Document Revised: 12/22/2012 Document Reviewed: 09/28/2012 Elsevier Interactive Patient Education [employer].

## 2016-02-06 NOTE — Assessment & Plan Note (Signed)
DM: Diet control, check A1c. Has neuropathy (paresthesias, pinprick examination normal). Feet care discussed HTN: Continue lisinopril, Toprol. Check a BMP, CBC High cholesterol: Continue Lipitor, check AST, ALT. He is not fasting but plans to ask cardiology to check a FLP CAD: Asx, continue controlling CV RF. Rec to see cardiology for 1 year check up to, declined referral Anxiety depression: Refill medications, symptoms controlled Primary care: Flu shot today, declined a pneumonia shot. RTC yearly checkup, 6 months

## 2016-04-15 ENCOUNTER — Telehealth: Payer: Self-pay | Admitting: Internal Medicine

## 2016-04-15 ENCOUNTER — Other Ambulatory Visit: Payer: Self-pay | Admitting: Cardiology

## 2016-04-15 NOTE — Telephone Encounter (Signed)
02/21/15 PR PPPS, INITIAL VISIT [[id number]] lvm advising patient to call and schedule appointment.

## 2016-04-15 NOTE — Telephone Encounter (Signed)
REFILL 

## 2016-04-15 NOTE — Telephone Encounter (Signed)
Spoke with patient regarding annual wellness visit. Patient stated that he was just in the office for a follow up in Oct 2017 and stated he does not want to have annual wellness visit.

## 2016-04-17 ENCOUNTER — Telehealth: Payer: Self-pay | Admitting: *Deleted

## 2016-04-17 NOTE — Telephone Encounter (Signed)
Declined

## 2016-05-05 HISTORY — PX: COLONOSCOPY: SHX174

## 2016-05-05 HISTORY — PX: POLYPECTOMY: SHX149

## 2016-06-16 ENCOUNTER — Encounter: Payer: Self-pay | Admitting: Cardiovascular Disease

## 2016-06-16 ENCOUNTER — Other Ambulatory Visit: Payer: Self-pay | Admitting: Cardiology

## 2016-06-17 NOTE — Telephone Encounter (Signed)
Rx(s) sent to pharmacy electronically.  

## 2016-06-19 ENCOUNTER — Encounter: Payer: Self-pay | Admitting: Gastroenterology

## 2016-06-19 ENCOUNTER — Telehealth: Payer: Self-pay | Admitting: Internal Medicine

## 2016-06-19 NOTE — Telephone Encounter (Signed)
02/21/15 PR PPPS, INITIAL VISIT [[id number]]  lvm advising patient to schedule AWV

## 2016-06-24 ENCOUNTER — Other Ambulatory Visit: Payer: Self-pay | Admitting: *Deleted

## 2016-06-24 ENCOUNTER — Telehealth: Payer: Self-pay | Admitting: Cardiovascular Disease

## 2016-06-24 DIAGNOSIS — I2510 Atherosclerotic heart disease of native coronary artery without angina pectoris: Secondary | ICD-10-CM

## 2016-06-24 DIAGNOSIS — Z79899 Other long term (current) drug therapy: Secondary | ICD-10-CM

## 2016-06-24 DIAGNOSIS — E785 Hyperlipidemia, unspecified: Secondary | ICD-10-CM

## 2016-06-24 DIAGNOSIS — Z125 Encounter for screening for malignant neoplasm of prostate: Secondary | ICD-10-CM

## 2016-06-24 DIAGNOSIS — F528 Other sexual dysfunction not due to a substance or known physiological condition: Secondary | ICD-10-CM

## 2016-06-24 NOTE — Telephone Encounter (Signed)
Please advise on pre visit labs.

## 2016-06-24 NOTE — Telephone Encounter (Signed)
Dr Claiborne Billings 's standard lab orders entered into the computer. Patient notified.

## 2016-06-24 NOTE — Telephone Encounter (Signed)
Follow Up   Pt called regarding lab order. Still hasn't heard anything back yet. Requesting a call back

## 2016-06-27 ENCOUNTER — Encounter: Payer: Self-pay | Admitting: Gastroenterology

## 2016-06-27 DIAGNOSIS — Z79899 Other long term (current) drug therapy: Secondary | ICD-10-CM | POA: Diagnosis not present

## 2016-06-27 DIAGNOSIS — Z125 Encounter for screening for malignant neoplasm of prostate: Secondary | ICD-10-CM | POA: Diagnosis not present

## 2016-06-27 DIAGNOSIS — F528 Other sexual dysfunction not due to a substance or known physiological condition: Secondary | ICD-10-CM | POA: Diagnosis not present

## 2016-06-27 DIAGNOSIS — I2510 Atherosclerotic heart disease of native coronary artery without angina pectoris: Secondary | ICD-10-CM | POA: Diagnosis not present

## 2016-06-27 DIAGNOSIS — E785 Hyperlipidemia, unspecified: Secondary | ICD-10-CM | POA: Diagnosis not present

## 2016-06-27 LAB — COMPREHENSIVE METABOLIC PANEL
ALBUMIN: 3.9 g/dL (ref 3.6–5.1)
ALK PHOS: 65 U/L (ref 40–115)
ALT: 18 U/L (ref 9–46)
AST: 18 U/L (ref 10–35)
BILIRUBIN TOTAL: 0.6 mg/dL (ref 0.2–1.2)
BUN: 16 mg/dL (ref 7–25)
CALCIUM: 9.1 mg/dL (ref 8.6–10.3)
CO2: 27 mmol/L (ref 20–31)
CREATININE: 0.88 mg/dL (ref 0.70–1.25)
Chloride: 105 mmol/L (ref 98–110)
GLUCOSE: 108 mg/dL — AB (ref 65–99)
Potassium: 4.8 mmol/L (ref 3.5–5.3)
SODIUM: 140 mmol/L (ref 135–146)
Total Protein: 6.7 g/dL (ref 6.1–8.1)

## 2016-06-27 LAB — LIPID PANEL
CHOLESTEROL: 170 mg/dL (ref <200)
HDL: 65 mg/dL (ref >40)
LDL CALC: 69 mg/dL (ref <100)
TRIGLYCERIDES: 180 mg/dL — AB (ref <150)
Total CHOL/HDL Ratio: 2.6 Ratio (ref <5.0)
VLDL: 36 mg/dL — AB (ref <30)

## 2016-06-27 LAB — TSH: TSH: 2.01 mIU/L (ref 0.40–4.50)

## 2016-06-28 LAB — PSA: PSA: 0.9 ng/mL (ref <=4.0)

## 2016-07-15 ENCOUNTER — Ambulatory Visit: Payer: Medicare Other | Admitting: Cardiovascular Disease

## 2016-07-24 ENCOUNTER — Encounter: Payer: Self-pay | Admitting: Cardiovascular Disease

## 2016-07-24 ENCOUNTER — Ambulatory Visit (INDEPENDENT_AMBULATORY_CARE_PROVIDER_SITE_OTHER): Payer: Medicare Other | Admitting: Cardiovascular Disease

## 2016-07-24 VITALS — BP 127/68 | Ht 70.0 in | Wt 188.5 lb

## 2016-07-24 DIAGNOSIS — R001 Bradycardia, unspecified: Secondary | ICD-10-CM

## 2016-07-24 DIAGNOSIS — I10 Essential (primary) hypertension: Secondary | ICD-10-CM

## 2016-07-24 DIAGNOSIS — R413 Other amnesia: Secondary | ICD-10-CM

## 2016-07-24 DIAGNOSIS — I2510 Atherosclerotic heart disease of native coronary artery without angina pectoris: Secondary | ICD-10-CM | POA: Diagnosis not present

## 2016-07-24 DIAGNOSIS — E785 Hyperlipidemia, unspecified: Secondary | ICD-10-CM | POA: Diagnosis not present

## 2016-07-24 MED ORDER — LISINOPRIL 20 MG PO TABS
20.0000 mg | ORAL_TABLET | Freq: Every day | ORAL | 3 refills | Status: DC
Start: 2016-07-24 — End: 2017-07-29

## 2016-07-24 MED ORDER — NITROGLYCERIN 0.4 MG SL SUBL: 0.4000 mg | SUBLINGUAL_TABLET | SUBLINGUAL | 1 refills | Status: DC | PRN

## 2016-07-24 MED ORDER — AMLODIPINE BESYLATE 5 MG PO TABS: 5.0000 mg | ORAL_TABLET | Freq: Every day | ORAL | 3 refills | Status: DC

## 2016-07-24 MED ORDER — ATORVASTATIN CALCIUM 40 MG PO TABS: 40.0000 mg | ORAL_TABLET | Freq: Every day | ORAL | 0 refills | Status: DC

## 2016-07-24 MED ORDER — METOPROLOL SUCCINATE ER 50 MG PO TB24: 50.0000 mg | ORAL_TABLET | Freq: Every day | ORAL | 3 refills | Status: DC

## 2016-07-24 NOTE — Progress Notes (Signed)
Patient ID: Dakota Gibbs, male   DOB: 1950/09/26, 66 y.o.   MRN: [id number]    PCP: Dr. Kathlene November  HPI: Dakota Gibbs is a 66 y.o. male who presents to the office today for a 22 month cardiology evaluation.  Dakota Gibbs suffered an inferior wall myocardial infarction on 05/10/1995 and underwent acute intervention to a totally occluded RCA. He had more distal disease in the posterolateral vessel which was treated medically. Several days later he underwent staged intervention to circumflex marginal vessel. His last cardiac catheterization was in 2001 which did not show restenosis and actually showed coronary artery disease regression.  He has been aggressively treated since his initial event.  When I saw him several  years ago he had noticed several episodes in the early morning while sleeping that he develops nocturnal palpitations. Upon further questioning he does snore and his snoring is more significantly abnormal particularly after drinking alcohol. He wakes up one to 2 times per night.  He was started on Celexa for anxiety/depression which has helped.  He has a history of hyperlipidemia and has been tolerating Lipitor. He also has GERD, and hypertension.   Over the past year, he admits to a 25 pound weight loss. In contrast to his previous diet many years ago when he tried the [employer], his current diet is eating less along with few carbohydrates. His weight is reduced from 208-180 3 pounds. He is sleeping significantly improved. He is not aware of any snoring.  He continues to take amlodipine 5 mg, lisinopril 20, no grams, Toprol-XL 50 mg for hypertension and CAD.  He is on Zantac 150 mg for dyspepsia/GERD. He has more energy.  Lab work done by his primary physician had shown improvement in his hemoglobin A1c at 6.0, improved from 6.6.   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  Nice chest pain or shortness of breath.  At times he believes  he may be having some short-term memory loss.  Continues to be fairly active.  He remains asymptomatic. He denies PND, orthopnea.  He denies change in exercise tolerance.  Continues to be on Toprol 50 mg, lisinopril 20 mg and amlodipine 5 mg for hypertension.  He is tolerating atorvastatin 40 mg for hyperlipidemia.  He takes sildenafil for erectile dysfunction.  He continues to be on Celexa 20 mg.  He presents for evaluation.   Past Medical History:  Diagnosis Date  . Allergy    enviornmental  . Anxiety and depression 10/25/2012  . Back pain    L4-L5 bulging disc, L5-S1 - bulging disc, pinched nerve in neck  . CAD (coronary artery disease)      Dr Claiborne Billings, s/p stents----- sees cards yearly   . Cancer ([place])    basal cell; carcinoma removed x3 and SCC  . Diabetes mellitus    pt denies  . GERD (gastroesophageal reflux disease)   . History of shingles 2009  . Myocardial infarction 1997    Past Surgical History:  Procedure Laterality Date  . ANGIOPLASTY  1997  . basel cell    . CARDIAC CATHETERIZATION     10/04/1999  . DOPPLER ECHOCARDIOGRAPHY  07/09/2006   EF 50 to 55 %, LA mildy dilated  . HERNIA REPAIR  1951   RIGHT  . KNEE ARTHROSCOPY  2001   R  . NM MYOCAR PERF WALL MOTION  08/22/2011   Mets 13,low risk study  . [place]  right    No Known Allergies  Current Outpatient Prescriptions  Medication Sig Dispense Refill  . amLODipine (NORVASC) 5 MG tablet Take 1 tablet (5 mg total) by mouth daily. 90 tablet 3  . aspirin EC 81 MG tablet Take 1 tablet (81 mg total) by mouth daily. 90 tablet 3  . atorvastatin (LIPITOR) 40 MG tablet Take 1 tablet (40 mg total) by mouth daily at 6 PM. 90 tablet 0  . azelastine (ASTELIN) 0.1 % nasal spray Place 1 spray into both nostrils 2 (two) times daily as needed for rhinitis or allergies. 90 mL 2  . cetirizine (ZYRTEC) 10 MG tablet Take 10 mg by mouth daily.    . citalopram (CELEXA) 20 MG tablet Take 1 tablet (20 mg total) by mouth  daily. 90 tablet 2  . lisinopril (PRINIVIL,ZESTRIL) 20 MG tablet Take 1 tablet (20 mg total) by mouth daily. 90 tablet 3  . Lutein 20 MG TABS Take 20 mg by mouth daily.    . metoprolol succinate (TOPROL-XL) 50 MG 24 hr tablet Take 1 tablet (50 mg total) by mouth at bedtime. Take with or immediately following a meal. 90 tablet 3  . Multiple Vitamin (MULTIVITAMIN) tablet Take 1 tablet by mouth daily.      . nitroGLYCERIN (NITROSTAT) 0.4 MG SL tablet Place 1 tablet (0.4 mg total) under the tongue every 5 (five) minutes as needed for chest pain. 25 tablet 1  . ranitidine (ZANTAC) 150 MG capsule Take 150 mg by mouth every evening.     . sildenafil (VIAGRA) 100 MG tablet Take 100 mg by mouth daily as needed.      Marland Kitchen tiZANidine (ZANAFLEX) 2 MG tablet Take 1 tablet (2 mg total) by mouth 3 (three) times daily as needed for muscle spasms. 90 tablet 1  . vitamin C (ASCORBIC ACID) 500 MG tablet Take 500 mg by mouth daily.      . vitamin E 400 UNIT capsule Take 400 Units by mouth daily.       No current facility-administered medications for this visit.     Social History   Social History  . Marital status: Married    Spouse name: N/A  . Number of children: 2  . Years of education: N/A   Occupational History  . executive recruter     Social History Main Topics  . Smoking status: Former Smoker    Quit date: 03/19/1992  . Smokeless tobacco: Never Used  . Alcohol use Yes     Comment:  5 glasses wine / night  . Drug use: No  . Sexual activity: Not on file   Other Topics Concern  . Not on file   Social History Narrative   Lives w/ wife               Social history is notable in that he is married and has 2 children. He now has a dog and walks the dog 1 mile 2 times a day. He is no longer using his treadmill. There is no tobacco use. He does drink alcohol.  Family History  Problem Relation Age of Onset  . Stroke Mother     TIA  . Hypertension Sister   . Prostate cancer Father      Father and brother   . Coronary artery disease Father     GM  . Diabetes Brother     B, GM, other fami;ly members   . Colon cancer Neg Hx     ROS  General: Negative; No fevers, chills, or night sweats;  Positive for purposeful weight loss of 25 pounds. HEENT: Negative; No changes in vision or hearing, sinus congestion, difficulty swallowing Pulmonary: Negative; No cough, wheezing, shortness of breath, hemoptysis Cardiovascular: Negative; No chest pain, presyncope, syncope, palpitations GI: Negative; No nausea, vomiting, diarrhea, or abdominal pain GU: Mild erectile dysfunction; No dysuria, hematuria, or difficulty voiding Musculoskeletal: Negative; no myalgias, joint pain, or weakness Hematologic/Oncology: Negative; no easy bruising, bleeding Endocrine: Negative; no heat/cold intolerance; no diabetes Neuro: Mild short-term memory loss; no changes in balance, headaches Skin: Negative; No rashes or skin lesions Psychiatric: Mild depression/anxiety Sleep: Positive for mild snoring which has significantly improved with his weight loss. no daytime sleepiness, hypersomnolence, bruxism, restless legs, hypnogognic hallucinations, no cataplexy Other comprehensive 14 point system review is negative.   PE BP 127/68   Ht 5\' 10"  (1.778 m)   Wt 188 lb 8 oz (85.5 kg)   BMI 27.05 kg/m    Wt Readings from Last 3 Encounters:  07/24/16 188 lb 8 oz (85.5 kg)  02/05/16 188 lb (85.3 kg)  03/15/15 183 lb (83 kg)   General: Alert, oriented, no distress.  Skin: normal turgor, no rashes HEENT: Normocephalic, atraumatic. Pupils round and reactive; sclera anicteric;no lid lag. Extraocular muscles intact. No xanthelasma Nose without nasal septal hypertrophy Mouth/Parynx benign; Mallinpatti scal 3 Neck: No JVD, no definitive carotid bruits; normal carotid upstroke Lungs: clear to ausculatation and percussion; no wheezing or rales Chest wall: no tenderness to palpitation Heart: RRR, s1 s2 normal 1/6  systolic murmur. No S3 or S4 gallop. No heave or rub. Abdomen: soft, nontender; no hepatosplenomehaly, BS+; abdominal aorta nontender and not dilated by palpation. Back: no CVA tenderness Pulses 2+ Extremities: no clubbing cyanosis or edema, Homan's sign negative  Neurologic: grossly nonfocal; cranial nerves grossly normal. Psychologic: normal affect and mood.  ECG (independently read by me): Sinus bradycardia 52 bpm.  Small nondiagnostic inferior Q waves.  Normal intervals  November 2016 ECG (independently read by me):  Sinus bradycardia at 49 bpm.  Small inferior Q waves with preserved R waves.  February 2016 ECG (independently read by me): Sinus bradycardia 46 bpm.  No ectopy.  February 2015 ECG (independently read by me): Normal sinus rhythm at 62 beats per minute. Small nondiagnostic inferior Q waves. Observed R waves.  LABS:  BMP Latest Ref Rng & Units 06/27/2016 02/05/2016 12/26/2014  Glucose 65 - 99 mg/dL 108(H) 164(H) 123(H)  BUN 7 - 25 mg/dL 16 20 18   Creatinine 0.70 - 1.25 mg/dL 0.88 0.87 0.87  Sodium 135 - 146 mmol/L 140 138 139  Potassium 3.5 - 5.3 mmol/L 4.8 4.3 5.0  Chloride 98 - 110 mmol/L 105 101 104  CO2 20 - 31 mmol/L 27 33(H) 29  Calcium 8.6 - 10.3 mg/dL 9.1 8.7 9.4      Component Value Date/Time   PROT 6.7 06/27/2016 0831   ALBUMIN 3.9 06/27/2016 0831   AST 18 06/27/2016 0831   ALT 18 06/27/2016 0831   ALKPHOS 65 06/27/2016 0831   BILITOT 0.6 06/27/2016 0831    CBC Latest Ref Rng & Units 02/05/2016 05/29/2014 05/20/2013  WBC 4.0 - 10.5 K/uL 7.4 7.1 5.7  Hemoglobin 13.0 - 17.0 g/dL 13.5 14.3 14.3  Hematocrit 39.0 - 52.0 % 39.4 41.7 42.0  Platelets 150.0 - 400.0 K/uL 236.0 252 234   Lab Results  Component Value Date   MCV 93.0 02/05/2016   MCV 91.2 05/29/2014   MCV 93.3 05/20/2013  Lab Results  Component Value Date   TSH 2.01 06/27/2016   Lab Results  Component Value Date   HGBA1C 6.1 02/05/2016    BNP No results found for: PROBNP  Lipid  Panel     Component Value Date/Time   CHOL 170 06/27/2016 0831   TRIG 180 (H) 06/27/2016 0831   HDL 65 06/27/2016 0831   CHOLHDL 2.6 06/27/2016 0831   VLDL 36 (H) 06/27/2016 0831   LDLCALC 69 06/27/2016 0831     RADIOLOGY: No results found.  IMPRESSION:  1. CAD in native artery   2. Essential hypertension   3. Hyperlipidemia with target LDL less than 70   4. Sinus bradycardia   5. Short-term memory loss     ASSESSMENT AND PLAN:  Mr. Inks is a 66 year old Caucasian male 21 years status post his inferior wall myocardial infarction at which time he underwent acute intervention to a totally occluded RCA and staged intervention to circumflex marginal vessel in January 1997. His last catheterization in 2001 showed plaque regression on his aggressive medical regimen.   I reduced his Toprol dose to 50 mg due to bradycardia in the past.  His ECG today shows sinus bradycardia 52, but he remains asymptomatic and his ventricular rate has increased from the 40s.  His blood pressure is well controlled on combination therapy with beta blocker, lisinopril and amlodipine.  He is not having any anginal symptomatology.  His lipid studies are excellent with his current dose of atorvastatin.  Reviewed with him the most recent data showing even lower LDL is seen with the P[employer] canine clinical trials can further improve cardiovascular outcomes.  I reviewed his most recent blood work with him.  His triglycerides being elevated.  We again discussed diet, reduction in carbohydrates and sweets, and omega 3 fatty acids.  If he notes further reduction in short-term memory.  I have suggested neurologic evaluation. Time spent: 25 minutes   Troy Sine, MD, [facility]  07/26/2016 3:11 PM

## 2016-07-24 NOTE — Patient Instructions (Signed)
Your physician wants you to follow-up in: 1 YEAR OR SOONER IF NEEDED. You will receive a reminder letter in the mail two months in advance. If you don't receive a letter, please call our office to schedule the follow-up appointment.   If you need a refill on your cardiac medications before your next appointment, please call your pharmacy. 

## 2016-08-14 ENCOUNTER — Ambulatory Visit (AMBULATORY_SURGERY_CENTER): Payer: Self-pay | Admitting: *Deleted

## 2016-08-14 VITALS — Ht 70.0 in | Wt 190.2 lb

## 2016-08-14 DIAGNOSIS — Z86010 Personal history of colonic polyps: Secondary | ICD-10-CM

## 2016-08-14 MED ORDER — NA SULFATE-K SULFATE-MG SULF 17.5-3.13-1.6 GM/177ML PO SOLN: ORAL | 0 refills | Status: DC

## 2016-08-14 NOTE — Progress Notes (Signed)
Pt denies allergies to eggs or soy products. Denies difficulty with sedation or anesthesia. Denies any diet or weight loss medications. Denies use of supplemental oxygen.  Emmi instructions given for procedure.  

## 2016-08-28 ENCOUNTER — Ambulatory Visit (AMBULATORY_SURGERY_CENTER): Payer: Medicare Other | Admitting: Gastroenterology

## 2016-08-28 ENCOUNTER — Encounter: Payer: Self-pay | Admitting: Gastroenterology

## 2016-08-28 VITALS — BP 147/71 | HR 48 | Temp 96.2°F | Resp 30 | Ht 70.0 in | Wt 190.0 lb

## 2016-08-28 DIAGNOSIS — D129 Benign neoplasm of anus and anal canal: Secondary | ICD-10-CM

## 2016-08-28 DIAGNOSIS — K621 Rectal polyp: Secondary | ICD-10-CM

## 2016-08-28 DIAGNOSIS — Z86010 Personal history of colonic polyps: Secondary | ICD-10-CM

## 2016-08-28 DIAGNOSIS — K635 Polyp of colon: Secondary | ICD-10-CM

## 2016-08-28 DIAGNOSIS — D124 Benign neoplasm of descending colon: Secondary | ICD-10-CM

## 2016-08-28 DIAGNOSIS — D128 Benign neoplasm of rectum: Secondary | ICD-10-CM

## 2016-08-28 DIAGNOSIS — D122 Benign neoplasm of ascending colon: Secondary | ICD-10-CM

## 2016-08-28 DIAGNOSIS — D123 Benign neoplasm of transverse colon: Secondary | ICD-10-CM | POA: Diagnosis not present

## 2016-08-28 DIAGNOSIS — E119 Type 2 diabetes mellitus without complications: Secondary | ICD-10-CM | POA: Diagnosis not present

## 2016-08-28 DIAGNOSIS — I2510 Atherosclerotic heart disease of native coronary artery without angina pectoris: Secondary | ICD-10-CM | POA: Diagnosis not present

## 2016-08-28 MED ORDER — SODIUM CHLORIDE 0.9 % IV SOLN: 500.0000 mL | INTRAVENOUS | Status: DC

## 2016-08-28 NOTE — Progress Notes (Signed)
Report given to PACU, vss 

## 2016-08-28 NOTE — Op Note (Addendum)
[facility] Patient Name: Dakota Gibbs Procedure Date: 08/28/2016 9:05 AM MRN: [id number] Endoscopist: Remo Lipps P. Kirin Brandenburger MD, MD Age: 66 Referring MD:  Date of Birth: August 06, 1950 Gender: Male Account #: [id number] Procedure:                Colonoscopy Indications:              Surveillance: Personal history of adenomatous                            polyps on last colonoscopy 5 years ago Medicines:                Monitored Anesthesia Care Procedure:                Pre-Anesthesia Assessment:                           - Prior to the procedure, a History and Physical                            was performed, and patient medications and                            allergies were reviewed. The patient's tolerance of                            previous anesthesia was also reviewed. The risks                            and benefits of the procedure and the sedation                            options and risks were discussed with the patient.                            All questions were answered, and informed consent                            was obtained. Prior Anticoagulants: The patient has                            taken aspirin, last dose was 1 day prior to                            procedure. ASA Grade Assessment: III - A patient                            with severe systemic disease. After reviewing the                            risks and benefits, the patient was deemed in                            satisfactory condition to undergo the procedure.  After obtaining informed consent, the colonoscope                            was passed under direct vision. Throughout the                            procedure, the patient's blood pressure, pulse, and                            oxygen saturations were monitored continuously. The                            Colonoscope was introduced through the anus and                            advanced to the  the cecum, identified by                            appendiceal orifice and ileocecal valve. The                            colonoscopy was performed without difficulty. The                            patient tolerated the procedure well. The quality                            of the bowel preparation was good. The ileocecal                            valve, appendiceal orifice, and rectum were                            photographed. Scope In: 9:13:58 AM Scope Out: 9:41:39 AM Scope Withdrawal Time: 0 hours 21 minutes 7 seconds  Total Procedure Duration: 0 hours 27 minutes 41 seconds  Findings:                 The perianal and digital rectal examinations were                            normal.                           Two sessile polyps were found in the ascending                            colon. The polyps were 4 to 5 mm in size. These                            polyps were removed with a cold snare. Resection                            and retrieval were complete.  A 6 mm polyp was found in the hepatic flexure. The                            polyp was sessile. The polyp was removed with a                            cold snare. Resection and retrieval were complete.                           A 6 mm polyp was found in the transverse colon. The                            polyp was sessile. The polyp was removed with a                            cold snare. Resection and retrieval were complete.                           A 3 mm polyp was found in the descending colon. The                            polyp was sessile. The polyp was removed with a                            cold snare. Resection and retrieval were complete.                           A 3 mm polyp was found in the rectum. The polyp was                            sessile. The polyp was removed with a cold snare.                            Resection and retrieval were complete.                            Scattered medium-mouthed diverticula were found in                            the left colon.                           Internal hemorrhoids were found during retroflexion.                           The colon had excessive looping which prolonged the                            exam. The exam was otherwise without abnormality. Complications:            No immediate complications. Estimated blood loss:  Minimal. Estimated Blood Loss:     Estimated blood loss was minimal. Impression:               - Two 4 to 5 mm polyps in the ascending colon,                            removed with a cold snare. Resected and retrieved.                           - One 6 mm polyp at the hepatic flexure, removed                            with a cold snare. Resected and retrieved.                           - One 6 mm polyp in the transverse colon, removed                            with a cold snare. Resected and retrieved.                           - One 3 mm polyp in the descending colon, removed                            with a cold snare. Resected and retrieved.                           - One 3 mm polyp in the rectum, removed with a cold                            snare. Resected and retrieved.                           - Diverticulosis in the left colon.                           - Internal hemorrhoids.                           - Significant looping which prolonged the exam.                           - The examination was otherwise normal. Recommendation:           - Patient has a contact number available for                            emergencies. The signs and symptoms of potential                            delayed complications were discussed with the                            patient. Return to normal activities tomorrow.  Written discharge instructions were provided to the                            patient.                           - Resume  previous diet.                           - Continue present medications.                           - Await pathology results.                           - Repeat colonoscopy is recommended for                            surveillance. The colonoscopy date will be                            determined after pathology results from today's                            exam become available for review.                           - No ibuprofen, naproxen, or other non-steroidal                            anti-inflammatory drugs for 2 weeks after polyp                            removal. Remo Lipps P. Ryun Velez MD, MD 08/28/2016 9:46:58 AM This report has been signed electronically.

## 2016-08-28 NOTE — Progress Notes (Signed)
Called to room to assist during endoscopic procedure.  Patient ID and intended procedure confirmed with present staff. Received instructions for my participation in the procedure from the performing physician.  

## 2016-08-28 NOTE — Patient Instructions (Signed)
Discharge instructions given. Handouts on polyps,diverticulosis and hemorrhoids. No ibuprofen, naproxen, or other NSAIDs for two weeks. Resume previous medications. YOU HAD AN ENDOSCOPIC PROCEDURE TODAY AT THE [facility]:   Refer to the procedure report that was given to you for any specific questions about what was found during the examination.  If the procedure report does not answer your questions, please call your gastroenterologist to clarify.  If you requested that your care partner not be given the details of your procedure findings, then the procedure report has been included in a sealed envelope for you to review at your convenience later.  YOU SHOULD EXPECT: Some feelings of bloating in the abdomen. Passage of more gas than usual.  Walking can help get rid of the air that was put into your GI tract during the procedure and reduce the bloating. If you had a lower endoscopy (such as a colonoscopy or flexible sigmoidoscopy) you may notice spotting of blood in your stool or on the toilet paper. If you underwent a bowel prep for your procedure, you may not have a normal bowel movement for a few days.  Please Note:  You might notice some irritation and congestion in your nose or some drainage.  This is from the oxygen used during your procedure.  There is no need for concern and it should clear up in a day or so.  SYMPTOMS TO REPORT IMMEDIATELY:   Following lower endoscopy (colonoscopy or flexible sigmoidoscopy):  Excessive amounts of blood in the stool  Significant tenderness or worsening of abdominal pains  Swelling of the abdomen that is new, acute  Fever of 100F or higher   For urgent or emergent issues, a gastroenterologist can be reached at any hour by calling [phone].   DIET:  We do recommend a small meal at first, but then you may proceed to your regular diet.  Drink plenty of fluids but you should avoid alcoholic beverages for 24 hours.  ACTIVITY:  You  should plan to take it easy for the rest of today and you should NOT DRIVE or use heavy machinery until tomorrow (because of the sedation medicines used during the test).    FOLLOW UP: Our staff will call the number listed on your records the next business day following your procedure to check on you and address any questions or concerns that you may have regarding the information given to you following your procedure. If we do not reach you, we will leave a message.  However, if you are feeling well and you are not experiencing any problems, there is no need to return our call.  We will assume that you have returned to your regular daily activities without incident.  If any biopsies were taken you will be contacted by phone or by letter within the next 1-3 weeks.  Please call us at [phone] if you have not heard about the biopsies in 3 weeks.    SIGNATURES/CONFIDENTIALITY: You and/or your care partner have signed paperwork which will be entered into your electronic medical record.  These signatures attest to the fact that that the information above on your After Visit Summary has been reviewed and is understood.  Full responsibility of the confidentiality of this discharge information lies with you and/or your care-partner. 

## 2016-08-29 ENCOUNTER — Telehealth: Payer: Self-pay

## 2016-08-29 NOTE — Telephone Encounter (Signed)
  Follow up Call-  Call back number 08/28/2016  Post procedure Call Back phone  # [phone]  Permission to leave phone message Yes  Some recent data might be hidden     Patient questions:  Do you have a fever, pain , or abdominal swelling? No. Pain Score  0 *  Have you tolerated food without any problems? Yes.    Have you been able to return to your normal activities? Yes.    Do you have any questions about your discharge instructions: Diet   No. Medications  No. Follow up visit  No.  Do you have questions or concerns about your Care? No.  Actions: * If pain score is 4 or above: No action needed, pain <4.

## 2016-09-06 DIAGNOSIS — H6503 Acute serous otitis media, bilateral: Secondary | ICD-10-CM | POA: Diagnosis not present

## 2016-09-06 DIAGNOSIS — R0981 Nasal congestion: Secondary | ICD-10-CM | POA: Diagnosis not present

## 2016-09-11 ENCOUNTER — Encounter: Payer: Self-pay | Admitting: Gastroenterology

## 2016-09-12 DIAGNOSIS — L255 Unspecified contact dermatitis due to plants, except food: Secondary | ICD-10-CM | POA: Diagnosis not present

## 2016-09-12 DIAGNOSIS — L03115 Cellulitis of right lower limb: Secondary | ICD-10-CM | POA: Diagnosis not present

## 2016-09-14 ENCOUNTER — Encounter: Payer: Self-pay | Admitting: Internal Medicine

## 2016-09-14 LAB — HEMOGLOBIN A1C: Hemoglobin A1C: 6.2

## 2016-10-01 ENCOUNTER — Other Ambulatory Visit: Payer: Self-pay | Admitting: Internal Medicine

## 2016-10-01 ENCOUNTER — Other Ambulatory Visit: Payer: Self-pay | Admitting: Cardiovascular Disease

## 2016-10-01 NOTE — Telephone Encounter (Signed)
Rx has been sent to the pharmacy electronically. ° °

## 2016-10-03 ENCOUNTER — Encounter: Payer: Self-pay | Admitting: Internal Medicine

## 2016-10-08 ENCOUNTER — Telehealth: Payer: Self-pay | Admitting: Internal Medicine

## 2016-10-08 NOTE — Telephone Encounter (Signed)
Called pt regarding awv. Pt stated that he will give the office a call back to schedule appt.

## 2016-10-30 DIAGNOSIS — H60333 Swimmer's ear, bilateral: Secondary | ICD-10-CM | POA: Diagnosis not present

## 2016-10-30 DIAGNOSIS — J300 Vasomotor rhinitis: Secondary | ICD-10-CM | POA: Diagnosis not present

## 2016-10-30 DIAGNOSIS — H6993 Unspecified Eustachian tube disorder, bilateral: Secondary | ICD-10-CM | POA: Diagnosis not present

## 2016-11-11 ENCOUNTER — Encounter: Payer: Self-pay | Admitting: Internal Medicine

## 2016-11-11 MED ORDER — CITALOPRAM HYDROBROMIDE 20 MG PO TABS: 30.0000 mg | ORAL_TABLET | Freq: Every day | ORAL | 1 refills | Status: DC

## 2016-11-11 NOTE — Telephone Encounter (Signed)
See message  Received: Today  Message Contents  Playas, Alda Berthold, MD  Damita Dunnings, [place] a prescription for citalopram 20 mg: 1.5 tablets daily #1 month supply and 3 refills for 90 day supply

## 2016-11-11 NOTE — Telephone Encounter (Signed)
Rx sent 

## 2017-05-03 DIAGNOSIS — Z23 Encounter for immunization: Secondary | ICD-10-CM | POA: Diagnosis not present

## 2017-05-08 ENCOUNTER — Telehealth: Payer: Self-pay | Admitting: Cardiovascular Disease

## 2017-05-08 DIAGNOSIS — Z0000 Encounter for general adult medical examination without abnormal findings: Secondary | ICD-10-CM

## 2017-05-08 DIAGNOSIS — I2510 Atherosclerotic heart disease of native coronary artery without angina pectoris: Secondary | ICD-10-CM

## 2017-05-08 DIAGNOSIS — E785 Hyperlipidemia, unspecified: Secondary | ICD-10-CM

## 2017-05-08 DIAGNOSIS — Z125 Encounter for screening for malignant neoplasm of prostate: Secondary | ICD-10-CM

## 2017-05-08 DIAGNOSIS — F528 Other sexual dysfunction not due to a substance or known physiological condition: Secondary | ICD-10-CM

## 2017-05-08 DIAGNOSIS — R002 Palpitations: Secondary | ICD-10-CM

## 2017-05-08 DIAGNOSIS — E1142 Type 2 diabetes mellitus with diabetic polyneuropathy: Secondary | ICD-10-CM

## 2017-05-08 NOTE — Telephone Encounter (Signed)
New Message     Please order A1c and PSA along with his yearly blood work

## 2017-05-08 NOTE — Telephone Encounter (Signed)
Spoke with pt, Lab orders mailed to the pt  

## 2017-05-14 ENCOUNTER — Encounter: Payer: Self-pay | Admitting: Internal Medicine

## 2017-05-14 DIAGNOSIS — D1801 Hemangioma of skin and subcutaneous tissue: Secondary | ICD-10-CM | POA: Diagnosis not present

## 2017-05-14 DIAGNOSIS — L570 Actinic keratosis: Secondary | ICD-10-CM | POA: Diagnosis not present

## 2017-05-14 DIAGNOSIS — D049 Carcinoma in situ of skin, unspecified: Secondary | ICD-10-CM | POA: Insufficient documentation

## 2017-05-14 DIAGNOSIS — L218 Other seborrheic dermatitis: Secondary | ICD-10-CM | POA: Diagnosis not present

## 2017-05-14 DIAGNOSIS — C44319 Basal cell carcinoma of skin of other parts of face: Secondary | ICD-10-CM | POA: Diagnosis not present

## 2017-05-14 DIAGNOSIS — L821 Other seborrheic keratosis: Secondary | ICD-10-CM | POA: Diagnosis not present

## 2017-05-14 DIAGNOSIS — Z85828 Personal history of other malignant neoplasm of skin: Secondary | ICD-10-CM | POA: Diagnosis not present

## 2017-05-14 DIAGNOSIS — D225 Melanocytic nevi of trunk: Secondary | ICD-10-CM | POA: Diagnosis not present

## 2017-05-14 DIAGNOSIS — C44519 Basal cell carcinoma of skin of other part of trunk: Secondary | ICD-10-CM | POA: Diagnosis not present

## 2017-05-19 DIAGNOSIS — H25013 Cortical age-related cataract, bilateral: Secondary | ICD-10-CM | POA: Diagnosis not present

## 2017-05-19 DIAGNOSIS — H25041 Posterior subcapsular polar age-related cataract, right eye: Secondary | ICD-10-CM | POA: Diagnosis not present

## 2017-05-19 DIAGNOSIS — H43821 Vitreomacular adhesion, right eye: Secondary | ICD-10-CM | POA: Diagnosis not present

## 2017-05-19 DIAGNOSIS — E119 Type 2 diabetes mellitus without complications: Secondary | ICD-10-CM | POA: Diagnosis not present

## 2017-05-19 LAB — HM DIABETES EYE EXAM

## 2017-05-21 ENCOUNTER — Encounter: Payer: Self-pay | Admitting: Internal Medicine

## 2017-05-25 ENCOUNTER — Other Ambulatory Visit: Payer: Self-pay | Admitting: Internal Medicine

## 2017-05-29 ENCOUNTER — Telehealth: Payer: Self-pay | Admitting: *Deleted

## 2017-05-29 ENCOUNTER — Ambulatory Visit: Payer: Medicare Other | Admitting: Internal Medicine

## 2017-05-29 NOTE — Telephone Encounter (Signed)
Received Dermatopathology Report results from [facility]; forwarded to provider/SLS 01/25

## 2017-06-01 ENCOUNTER — Encounter: Payer: Self-pay | Admitting: Internal Medicine

## 2017-06-01 ENCOUNTER — Ambulatory Visit (INDEPENDENT_AMBULATORY_CARE_PROVIDER_SITE_OTHER): Payer: Medicare Other | Admitting: Internal Medicine

## 2017-06-01 VITALS — BP 118/68 | HR 49 | Temp 97.6°F | Resp 14 | Ht 70.0 in | Wt 189.0 lb

## 2017-06-01 DIAGNOSIS — E785 Hyperlipidemia, unspecified: Secondary | ICD-10-CM | POA: Diagnosis not present

## 2017-06-01 DIAGNOSIS — D049 Carcinoma in situ of skin, unspecified: Secondary | ICD-10-CM

## 2017-06-01 DIAGNOSIS — I10 Essential (primary) hypertension: Secondary | ICD-10-CM | POA: Diagnosis not present

## 2017-06-01 DIAGNOSIS — I2510 Atherosclerotic heart disease of native coronary artery without angina pectoris: Secondary | ICD-10-CM | POA: Diagnosis not present

## 2017-06-01 DIAGNOSIS — R739 Hyperglycemia, unspecified: Secondary | ICD-10-CM

## 2017-06-01 DIAGNOSIS — Z23 Encounter for immunization: Secondary | ICD-10-CM

## 2017-06-01 DIAGNOSIS — E1142 Type 2 diabetes mellitus with diabetic polyneuropathy: Secondary | ICD-10-CM | POA: Diagnosis not present

## 2017-06-01 DIAGNOSIS — Z09 Encounter for follow-up examination after completed treatment for conditions other than malignant neoplasm: Secondary | ICD-10-CM | POA: Diagnosis not present

## 2017-06-01 DIAGNOSIS — Z125 Encounter for screening for malignant neoplasm of prostate: Secondary | ICD-10-CM | POA: Diagnosis not present

## 2017-06-01 DIAGNOSIS — F528 Other sexual dysfunction not due to a substance or known physiological condition: Secondary | ICD-10-CM | POA: Diagnosis not present

## 2017-06-01 DIAGNOSIS — R002 Palpitations: Secondary | ICD-10-CM | POA: Diagnosis not present

## 2017-06-01 DIAGNOSIS — Z0000 Encounter for general adult medical examination without abnormal findings: Secondary | ICD-10-CM | POA: Diagnosis not present

## 2017-06-01 NOTE — Progress Notes (Addendum)
Subjective:    Patient ID: Dakota Gibbs, male    DOB: [birth year], 67 y.o.   MRN: [id number]  DOS:  06/01/2017 Type of visit - description : rov, last office visit 02-2016 Interval history: DM: Diet controlled, due for A1c HTN: Good compliance of medication, ambulatory BPs when check 130/70 or less . CAD: Asymptomatic.  To see Dr. Claiborne Billings soon High cholesterol: Good compliance with Lipitor without apparent side effects   Review of Systems Denies chest pain or difficulty breathing No nausea, vomiting, diarrhea or blood in the stools Sees dermatology regularly, to have mohs  surgery  Past Medical History:  Diagnosis Date  . Allergy    enviornmental  . Anxiety   . Anxiety and depression 10/25/2012  . Back pain    L4-L5 bulging disc, L5-S1 - bulging disc, pinched nerve in neck  . CAD (coronary artery disease)      Dr Claiborne Billings, s/p stents----- sees cards yearly   . Cancer ([place])    basal cell; carcinoma removed x3 and SCC  . Depression   . Diabetes mellitus    pt denies  . GERD (gastroesophageal reflux disease)   . History of shingles 2009  . Myocardial infarction ([place]) 1997    Past Surgical History:  Procedure Laterality Date  . ANGIOPLASTY  1997  . basel cell    . CARDIAC CATHETERIZATION     10/04/1999  . DOPPLER ECHOCARDIOGRAPHY  07/09/2006   EF 50 to 55 %, LA mildy dilated  . HERNIA REPAIR  1951   RIGHT  . KNEE ARTHROSCOPY  2001   R  . NM MYOCAR PERF WALL MOTION  08/22/2011   Mets 13,low risk study  . WRIST SURGERY  1984   right    Social History   Socioeconomic History  . Marital status: Married    Spouse name: Not on file  . Number of children: 2  . Years of education: Not on file  . Highest education level: Not on file  Social Needs  . Financial resource strain: Not on file  . Food insecurity - worry: Not on file  . Food insecurity - inability: Not on file  . Transportation needs - medical: Not on file  . Transportation needs - non-medical: Not on  file  Occupational History  . Occupation: Engineer, manufacturing   Tobacco Use  . Smoking status: Former Smoker    Last attempt to quit: 03/19/1992    Years since quitting: 25.2  . Smokeless tobacco: Never Used  Substance and Sexual Activity  . Alcohol use: Yes    Comment:  5 glasses wine / night  . Drug use: No  . Sexual activity: Not on file  Other Topics Concern  . Not on file  Social History Narrative   Lives w/ wife               Allergies as of 06/01/2017   No Known Allergies     Medication List        Accurate as of 06/01/17 11:59 PM. Always use your most recent med list.          amLODipine 5 MG tablet Commonly known as:  NORVASC Take 1 tablet (5 mg total) by mouth daily.   aspirin EC 81 MG tablet Take 1 tablet (81 mg total) by mouth daily.   atorvastatin 40 MG tablet Commonly known as:  LIPITOR TAKE 1 TABLET EVERY DAY  AT  6  PM   cetirizine 10 MG tablet  Commonly known as:  ZYRTEC Take 10 mg by mouth daily.   citalopram 20 MG tablet Commonly known as:  CELEXA Take 1.5 tablets (30 mg total) by mouth daily.   lisinopril 20 MG tablet Commonly known as:  PRINIVIL,ZESTRIL Take 1 tablet (20 mg total) by mouth daily.   Lutein 20 MG Tabs Take 20 mg by mouth daily.   metoprolol succinate 50 MG 24 hr tablet Commonly known as:  TOPROL-XL Take 1 tablet (50 mg total) by mouth at bedtime. Take with or immediately following a meal.   multivitamin tablet Take 1 tablet by mouth daily.   nitroGLYCERIN 0.4 MG SL tablet Commonly known as:  NITROSTAT Place 1 tablet (0.4 mg total) under the tongue every 5 (five) minutes as needed for chest pain.   ranitidine 150 MG capsule Commonly known as:  ZANTAC Take 150 mg by mouth every evening.   sildenafil 100 MG tablet Commonly known as:  VIAGRA Take 100 mg by mouth daily as needed.   vitamin C 500 MG tablet Commonly known as:  ASCORBIC ACID Take 500 mg by mouth daily.   vitamin E 400 UNIT capsule Take 400  Units by mouth daily.          Objective:   Physical Exam BP 118/68 (BP Location: Right Arm, Patient Position: Sitting, Cuff Size: Normal)   Pulse (!) 49   Temp 97.6 F (36.4 C) (Oral)   Resp 14   Ht 5\' 10"  (1.778 m)   Wt 189 lb (85.7 kg)   SpO2 93%   BMI 27.12 kg/m  General:   Well developed, well nourished . NAD.  HEENT:  Normocephalic . Face symmetric, atraumatic Lungs:  CTA B Normal respiratory effort, no intercostal retractions, no accessory muscle use. Heart: RRR,  no murmur.  no pretibial edema bilaterally  Abdomen:  Not distended, soft, non-tender. No rebound or rigidity.   Skin: Not pale. Not jaundice Neurologic:  alert & oriented X3.  Speech normal, gait appropriate for age and unassisted Psych--  Cognition and judgment appear intact.  Cooperative with normal attention span and concentration.  Behavior appropriate. No anxious or depressed appearing.     Assessment & Plan:   Assessment   DM, + neuropathy (numbness) HTN CAD MI 1997 Anxiety and depression GERD Snoring, decreased after etoh decreased MSK: Back pain Skin cancer, BCC 3  Plan: Labs: Cardiology order a PSA, CMP, TSH, FLP, A1c in preparation to his and this visit Addendum: PSA , A1C very good  DM: Diet controlled, labs pending.  Had negative eye exam per patient. HTN: Seems well controlled on lisinopril, Toprol, amlodipine. Depression:  got very irritable, citalopram dose increased to 30 mg, since then he is doing very well. CAD: Denies symptoms at this point.  To see Dr. Claiborne Billings next in the next few weeks Skin cancer: To have a mohs surgery soon, L face  RTC 8-10 months CPX.

## 2017-06-01 NOTE — Progress Notes (Signed)
Pre visit review using our clinic review tool, if applicable. No additional management support is needed unless otherwise documented below in the visit note. 

## 2017-06-01 NOTE — Assessment & Plan Note (Addendum)
Had a flu shot  pnm 23 today F/u Cscope 08-2016 + FH prostate cancer, father age 69.  Brother age 45.  Declining DRE, PSA pending. Recommend CPX on RTC

## 2017-06-01 NOTE — Patient Instructions (Signed)
  GO TO THE FRONT DESK Schedule your next appointment for a physical exam in 8-10 months  Please consider do a Medicare wellness with one of our nurses

## 2017-06-02 ENCOUNTER — Other Ambulatory Visit: Payer: Self-pay | Admitting: *Deleted

## 2017-06-02 DIAGNOSIS — E875 Hyperkalemia: Secondary | ICD-10-CM

## 2017-06-02 LAB — COMPREHENSIVE METABOLIC PANEL
ALK PHOS: 74 IU/L (ref 39–117)
ALT: 19 IU/L (ref 0–44)
AST: 25 IU/L (ref 0–40)
Albumin/Globulin Ratio: 1.6 (ref 1.2–2.2)
Albumin: 4.2 g/dL (ref 3.6–4.8)
BILIRUBIN TOTAL: 0.2 mg/dL (ref 0.0–1.2)
BUN/Creatinine Ratio: 23 (ref 10–24)
BUN: 22 mg/dL (ref 8–27)
CHLORIDE: 101 mmol/L (ref 96–106)
CO2: 23 mmol/L (ref 20–29)
Calcium: 9.4 mg/dL (ref 8.6–10.2)
Creatinine, Ser: 0.96 mg/dL (ref 0.76–1.27)
GFR calc Af Amer: 94 mL/min/{1.73_m2} (ref >59)
GFR calc non Af Amer: 81 mL/min/{1.73_m2} (ref >59)
Globulin, Total: 2.7 g/dL (ref 1.5–4.5)
Glucose: 110 mg/dL — ABNORMAL HIGH (ref 65–99)
Potassium: 5.5 mmol/L — ABNORMAL HIGH (ref 3.5–5.2)
Sodium: 140 mmol/L (ref 134–144)
TOTAL PROTEIN: 6.9 g/dL (ref 6.0–8.5)

## 2017-06-02 LAB — LIPID PANEL
Chol/HDL Ratio: 2.8 ratio (ref 0.0–5.0)
Cholesterol, Total: 182 mg/dL (ref 100–199)
HDL: 64 mg/dL (ref >39)
LDL CALC: 74 mg/dL (ref 0–99)
TRIGLYCERIDES: 222 mg/dL — AB (ref 0–149)
VLDL CHOLESTEROL CAL: 44 mg/dL — AB (ref 5–40)

## 2017-06-02 LAB — HEMOGLOBIN A1C
Est. average glucose Bld gHb Est-mCnc: 128 mg/dL
Hgb A1c MFr Bld: 6.1 % — ABNORMAL HIGH (ref 4.8–5.6)

## 2017-06-02 LAB — TSH: TSH: 2.1 u[IU]/mL (ref 0.450–4.500)

## 2017-06-02 LAB — PSA: PROSTATE SPECIFIC AG, SERUM: 1 ng/mL (ref 0.0–4.0)

## 2017-06-02 NOTE — Assessment & Plan Note (Addendum)
Labs: Cardiology order a PSA, CMP, TSH, FLP, A1c in preparation to his and this visit Addendum: PSA , A1C very good  DM: Diet controlled, labs pending.  Had negative eye exam per patient. HTN: Seems well controlled on lisinopril, Toprol, amlodipine. Depression:  got very irritable, citalopram dose increased to 30 mg, since then he is doing very well. CAD: Denies symptoms at this point.  To see Dr. Claiborne Billings next in the next few weeks Skin cancer: To have a mohs surgery soon, L face  RTC 8-10 months CPX.

## 2017-06-03 ENCOUNTER — Other Ambulatory Visit: Payer: Self-pay | Admitting: Internal Medicine

## 2017-06-04 ENCOUNTER — Encounter: Payer: Self-pay | Admitting: *Deleted

## 2017-06-04 NOTE — Telephone Encounter (Signed)
This encounter was created in error - please disregard.

## 2017-06-04 NOTE — Telephone Encounter (Addendum)
-----   Message from Troy Sine, MD sent at 06/02/2017  6:02 PM EST ----- Potassium elevated at 5.5.  Avoid potassium-containing foods.  Hold lisinopril; recheck potassium in 3-4 days.   Left message for pt to call

## 2017-06-16 DIAGNOSIS — C44319 Basal cell carcinoma of skin of other parts of face: Secondary | ICD-10-CM | POA: Diagnosis not present

## 2017-06-16 DIAGNOSIS — Z85828 Personal history of other malignant neoplasm of skin: Secondary | ICD-10-CM | POA: Diagnosis not present

## 2017-06-25 ENCOUNTER — Other Ambulatory Visit: Payer: Self-pay | Admitting: *Deleted

## 2017-06-25 DIAGNOSIS — E875 Hyperkalemia: Secondary | ICD-10-CM | POA: Diagnosis not present

## 2017-06-26 LAB — BASIC METABOLIC PANEL
BUN / CREAT RATIO: 23 (ref 10–24)
BUN: 19 mg/dL (ref 8–27)
CO2: 24 mmol/L (ref 20–29)
CREATININE: 0.81 mg/dL (ref 0.76–1.27)
Calcium: 9.3 mg/dL (ref 8.6–10.2)
Chloride: 104 mmol/L (ref 96–106)
GFR, EST AFRICAN AMERICAN: 106 mL/min/{1.73_m2} (ref >59)
GFR, EST NON AFRICAN AMERICAN: 91 mL/min/{1.73_m2} (ref >59)
Glucose: 110 mg/dL — ABNORMAL HIGH (ref 65–99)
Potassium: 5.1 mmol/L (ref 3.5–5.2)
SODIUM: 141 mmol/L (ref 134–144)

## 2017-07-09 ENCOUNTER — Ambulatory Visit (INDEPENDENT_AMBULATORY_CARE_PROVIDER_SITE_OTHER): Payer: Medicare Other | Admitting: Internal Medicine

## 2017-07-09 ENCOUNTER — Encounter: Payer: Self-pay | Admitting: Internal Medicine

## 2017-07-09 VITALS — BP 145/50 | HR 50 | Resp 16 | Ht 70.0 in | Wt 190.0 lb

## 2017-07-09 DIAGNOSIS — J029 Acute pharyngitis, unspecified: Secondary | ICD-10-CM | POA: Diagnosis not present

## 2017-07-09 DIAGNOSIS — I2510 Atherosclerotic heart disease of native coronary artery without angina pectoris: Secondary | ICD-10-CM

## 2017-07-09 MED ORDER — PANTOPRAZOLE SODIUM 40 MG PO TBEC: 40.0000 mg | DELAYED_RELEASE_TABLET | Freq: Every day | ORAL | 3 refills | Status: DC

## 2017-07-09 NOTE — Patient Instructions (Signed)
Add pantoprazole 1 tablet before breakfast once a day for 6 weeks  If no better in 3 weeks, call your ENT doctor, likely you will need more evaluation  Call if questions

## 2017-07-09 NOTE — Progress Notes (Signed)
Subjective:    Patient ID: Dakota Gibbs, male    DOB: 10-Oct-1950, 67 y.o.   MRN: [id number]  DOS:  07/09/2017 Type of visit - description : acute Interval history: About 2 weeks ago he was enjoying a cigar and he felt burning in the back of the throat, the area is since then hurting, again described as a burning. Sometimes in the mornings, he wake up with a dry throat, has a light cough.  Review of Systems  No fever chills.  No runny nose, no sinus congestion no chest congestion. He has mild postnasal dripping which is not a new issue Also -and possibly unrelated- had right ear ache for a few months, saw "the ear doctor" 6 months ago and told everything was okay. GERD: On Zantac, occasionally has breakthrough symptoms   Past Medical History:  Diagnosis Date  . Allergy    enviornmental  . Anxiety   . Anxiety and depression 10/25/2012  . Back pain    L4-L5 bulging disc, L5-S1 - bulging disc, pinched nerve in neck  . CAD (coronary artery disease)      Dr Claiborne Billings, s/p stents----- sees cards yearly   . Cancer ([place])    basal cell; carcinoma removed x3 and SCC  . Depression   . Diabetes mellitus    pt denies  . GERD (gastroesophageal reflux disease)   . History of shingles 2009  . Myocardial infarction ([place]) 1997    Past Surgical History:  Procedure Laterality Date  . ANGIOPLASTY  1997  . basel cell    . CARDIAC CATHETERIZATION     10/04/1999  . DOPPLER ECHOCARDIOGRAPHY  07/09/2006   EF 50 to 55 %, LA mildy dilated  . HERNIA REPAIR  1951   RIGHT  . KNEE ARTHROSCOPY  2001   R  . NM MYOCAR PERF WALL MOTION  08/22/2011   Mets 13,low risk study  . WRIST SURGERY  1984   right    Social History   Socioeconomic History  . Marital status: Married    Spouse name: Not on file  . Number of children: 2  . Years of education: Not on file  . Highest education level: Not on file  Social Needs  . Financial resource strain: Not on file  . Food insecurity - worry: Not on file    . Food insecurity - inability: Not on file  . Transportation needs - medical: Not on file  . Transportation needs - non-medical: Not on file  Occupational History  . Occupation: Engineer, manufacturing   Tobacco Use  . Smoking status: Light Tobacco Smoker    Types: Cigars    Last attempt to quit: 03/19/1992    Years since quitting: 25.3  . Smokeless tobacco: Never Used  Substance and Sexual Activity  . Alcohol use: Yes    Comment:  5 glasses wine / night  . Drug use: No  . Sexual activity: Not on file  Other Topics Concern  . Not on file  Social History Narrative   Lives w/ wife               Allergies as of 07/09/2017   No Known Allergies     Medication List        Accurate as of 07/09/17 11:59 PM. Always use your most recent med list.          amLODipine 5 MG tablet Commonly known as:  NORVASC Take 1 tablet (5 mg total) by mouth daily.  aspirin EC 81 MG tablet Take 1 tablet (81 mg total) by mouth daily.   atorvastatin 40 MG tablet Commonly known as:  LIPITOR TAKE 1 TABLET EVERY DAY  AT  6  PM   cetirizine 10 MG tablet Commonly known as:  ZYRTEC Take 10 mg by mouth daily.   citalopram 20 MG tablet Commonly known as:  CELEXA Take 1.5 tablets (30 mg total) by mouth daily.   lisinopril 20 MG tablet Commonly known as:  PRINIVIL,ZESTRIL Take 1 tablet (20 mg total) by mouth daily.   Lutein 20 MG Tabs Take 20 mg by mouth daily.   metoprolol succinate 50 MG 24 hr tablet Commonly known as:  TOPROL-XL Take 1 tablet (50 mg total) by mouth at bedtime. Take with or immediately following a meal.   multivitamin tablet Take 1 tablet by mouth daily.   nitroGLYCERIN 0.4 MG SL tablet Commonly known as:  NITROSTAT Place 1 tablet (0.4 mg total) under the tongue every 5 (five) minutes as needed for chest pain.   pantoprazole 40 MG tablet Commonly known as:  PROTONIX Take 1 tablet (40 mg total) by mouth daily.   ranitidine 150 MG capsule Commonly known as:   ZANTAC Take 150 mg by mouth every evening.   sildenafil 100 MG tablet Commonly known as:  VIAGRA Take 100 mg by mouth daily as needed.   vitamin C 500 MG tablet Commonly known as:  ASCORBIC ACID Take 500 mg by mouth daily.   vitamin E 400 UNIT capsule Take 400 Units by mouth daily.          Objective:   Physical Exam BP (!) 145/50 (BP Location: Right Arm, Patient Position: Sitting, Cuff Size: Large)   Pulse (!) 50   Resp 16   Ht 5\' 10"  (1.778 m)   Wt 190 lb (86.2 kg)   SpO2 100%   BMI 27.26 kg/m  General:   Well developed, well nourished . NAD.  HEENT:  Normocephalic . Face symmetric, atraumatic. TMs: Normal Throat: Symmetric, tonsils not enlarged, uvula midline, no obvious lesions. Neck: No thyromegaly, no mass or [name] at the sides of the neck or supraclavicular area Lungs:  CTA B Normal respiratory effort, no intercostal retractions, no accessory muscle use. Heart: RRR,  no murmur.  No pretibial edema bilaterally  Skin: Not pale. Not jaundice Neurologic:  alert & oriented X3.  Speech normal, gait appropriate for age and unassisted Psych--  Cognition and judgment appear intact.  Cooperative with normal attention span and concentration.  Behavior appropriate. No anxious or depressed appearing.      Assessment & Plan:   Assessment   DM, + neuropathy (numbness) HTN CAD MI 1997 Anxiety and depression GERD Snoring, decreased after etoh decreased MSK: Back pain Skin cancer, BCC 3  Plan: Sore throat: As described above.  The patient used to smoke cigarettes, quit many years ago, lately has been smoking a cigar occasionally, states he is now done with cigars. ST started 2 weeks ago, exam is benign, he has a history of GERD with occ breakthrough sx. ST could be due to reflux. Plan: Continue Zantac, add pantoprazole for 6 weeks, if not better will need more thorough evaluation by ENT for sore throat.  Patient aware, will call for referral.

## 2017-07-10 NOTE — Assessment & Plan Note (Signed)
Sore throat: As described above.  The patient used to smoke cigarettes, quit many years ago, lately has been smoking a cigar occasionally, states he is now done with cigars. ST started 2 weeks ago, exam is benign, he has a history of GERD with occ breakthrough sx. ST could be due to reflux. Plan: Continue Zantac, add pantoprazole for 6 weeks, if not better will need more thorough evaluation by ENT for sore throat.  Patient aware, will call for referral.

## 2017-07-24 ENCOUNTER — Encounter: Payer: Self-pay | Admitting: Cardiovascular Disease

## 2017-07-24 ENCOUNTER — Ambulatory Visit (INDEPENDENT_AMBULATORY_CARE_PROVIDER_SITE_OTHER): Payer: Medicare Other | Admitting: Cardiovascular Disease

## 2017-07-24 VITALS — BP 122/54 | HR 51 | Ht 70.0 in | Wt 191.8 lb

## 2017-07-24 DIAGNOSIS — Z125 Encounter for screening for malignant neoplasm of prostate: Secondary | ICD-10-CM | POA: Diagnosis not present

## 2017-07-24 DIAGNOSIS — I2510 Atherosclerotic heart disease of native coronary artery without angina pectoris: Secondary | ICD-10-CM

## 2017-07-24 DIAGNOSIS — R002 Palpitations: Secondary | ICD-10-CM | POA: Diagnosis not present

## 2017-07-24 DIAGNOSIS — E785 Hyperlipidemia, unspecified: Secondary | ICD-10-CM | POA: Diagnosis not present

## 2017-07-24 DIAGNOSIS — R001 Bradycardia, unspecified: Secondary | ICD-10-CM | POA: Diagnosis not present

## 2017-07-24 DIAGNOSIS — E8881 Metabolic syndrome: Secondary | ICD-10-CM | POA: Diagnosis not present

## 2017-07-24 DIAGNOSIS — R39198 Other difficulties with micturition: Secondary | ICD-10-CM

## 2017-07-24 DIAGNOSIS — F528 Other sexual dysfunction not due to a substance or known physiological condition: Secondary | ICD-10-CM

## 2017-07-24 NOTE — Progress Notes (Signed)
Patient ID: Dakota Gibbs, male   DOB: 10-13-1950, 67 y.o.   MRN: [id number]    PCP: Dr. Kathlene November  HPI: Dakota Gibbs is a 67 y.o. male who presents to the office today for a 66 month cardiology evaluation.  Mr. Zulauf suffered an inferior wall myocardial infarction on 05/10/1995 and underwent acute intervention to a totally occluded RCA. He had more distal disease in the posterolateral vessel which was treated medically. Several days later he underwent staged intervention to circumflex marginal vessel. His last cardiac catheterization was in 2001 which did not show restenosis and actually showed coronary artery disease regression.  He has been aggressively treated since his initial event.  When I saw him several  years ago he had noticed several episodes in the early morning while sleeping that he develops nocturnal palpitations. Upon further questioning he does snore and his snoring is more significantly abnormal particularly after drinking alcohol. He wakes up one to 2 times per night.  He was started on Celexa for anxiety/depression which has helped.  He has a history of hyperlipidemia and has been tolerating Lipitor. He also has GERD, and hypertension.   Over the past year, he admits to a 25 pound weight loss. In contrast to his previous diet many years ago when he tried the [employer], his current diet is eating less along with few carbohydrates. His weight is reduced from 208-180 3 pounds. He is sleeping significantly improved. He is not aware of any snoring.  He continues to take amlodipine 5 mg, lisinopril 20, no grams, Toprol-XL 50 mg for hypertension and CAD.  He is on Zantac 150 mg for dyspepsia/GERD. He has more energy.  Lab work done by his primary physician had shown improvement in his hemoglobin A1c at 6.0, improved from 6.6.   I last saw him in March 2018.  At that time he continues to do well an was working in the executive recruiting business.  He denied chest pain  or shortness of breath.  At times he believes he may be having some short-term memory loss.    ontinues to be fairly active.  He remains asymptomatic. He denies PND, orthopnea.  He denies change in exercise tolerance.  Continues to be on Toprol 50 mg, lisinopril 20 mg and amlodipine 5 mg for hypertension.  He is tolerating atorvastatin 40 mg for hyperlipidemia.  He takes sildenafil for erectile dysfunction.  He continues to be on Celexa 20 mg.  He presents for evaluation.   Past Medical History:  Diagnosis Date  . Allergy    enviornmental  . Anxiety   . Anxiety and depression 10/25/2012  . Back pain    L4-L5 bulging disc, L5-S1 - bulging disc, pinched nerve in neck  . CAD (coronary artery disease)      Dr Claiborne Billings, s/p stents----- sees cards yearly   . Cancer ([place])    basal cell; carcinoma removed x3 and SCC  . Depression   . Diabetes mellitus    pt denies  . GERD (gastroesophageal reflux disease)   . History of shingles 2009  . Myocardial infarction ([place]) 1997    Past Surgical History:  Procedure Laterality Date  . ANGIOPLASTY  1997  . basel cell    . CARDIAC CATHETERIZATION     10/04/1999  . DOPPLER ECHOCARDIOGRAPHY  07/09/2006   EF 50 to 55 %, LA mildy dilated  . HERNIA REPAIR  1951   RIGHT  . KNEE ARTHROSCOPY  2001   R  .  NM MYOCAR PERF WALL MOTION  08/22/2011   Mets 13,low risk study  . WRIST SURGERY  1984   right    No Known Allergies  Current Outpatient Medications  Medication Sig Dispense Refill  . amLODipine (NORVASC) 5 MG tablet Take 1 tablet (5 mg total) by mouth daily. 90 tablet 3  . aspirin EC 81 MG tablet Take 1 tablet (81 mg total) by mouth daily. 90 tablet 3  . atorvastatin (LIPITOR) 40 MG tablet TAKE 1 TABLET EVERY DAY  AT  6  PM 90 tablet 3  . cetirizine (ZYRTEC) 10 MG tablet Take 10 mg by mouth daily.    . citalopram (CELEXA) 20 MG tablet Take 1.5 tablets (30 mg total) by mouth daily. 135 tablet 1  . lisinopril (PRINIVIL,ZESTRIL) 20 MG tablet Take 1  tablet (20 mg total) by mouth daily. 90 tablet 3  . Lutein 20 MG TABS Take 20 mg by mouth daily.    . metoprolol succinate (TOPROL-XL) 50 MG 24 hr tablet Take 1 tablet (50 mg total) by mouth at bedtime. Take with or immediately following a meal. 90 tablet 3  . Multiple Vitamin (MULTIVITAMIN) tablet Take 1 tablet by mouth daily.      . nitroGLYCERIN (NITROSTAT) 0.4 MG SL tablet Place 1 tablet (0.4 mg total) under the tongue every 5 (five) minutes as needed for chest pain. 25 tablet 1  . pantoprazole (PROTONIX) 40 MG tablet Take 1 tablet (40 mg total) by mouth daily. 30 tablet 3  . ranitidine (ZANTAC) 150 MG capsule Take 150 mg by mouth every evening.     . sildenafil (VIAGRA) 100 MG tablet Take 100 mg by mouth daily as needed.      . vitamin C (ASCORBIC ACID) 500 MG tablet Take 500 mg by mouth daily.      . vitamin E 400 UNIT capsule Take 400 Units by mouth daily.       Current Facility-Administered Medications  Medication Dose Route Frequency Provider Last Rate Last Dose  . 0.9 %  sodium chloride infusion  500 mL Intravenous Continuous Armbruster, Carlota Raspberry, MD        Social History   Socioeconomic History  . Marital status: Married    Spouse name: Not on file  . Number of children: 2  . Years of education: Not on file  . Highest education level: Not on file  Occupational History  . Occupation: Engineer, manufacturing   Social Needs  . Financial resource strain: Not on file  . Food insecurity:    Worry: Not on file    Inability: Not on file  . Transportation needs:    Medical: Not on file    Non-medical: Not on file  Tobacco Use  . Smoking status: Light Tobacco Smoker    Types: Cigars    Last attempt to quit: 03/19/1992    Years since quitting: 25.3  . Smokeless tobacco: Never Used  Substance and Sexual Activity  . Alcohol use: Yes    Comment:  5 glasses wine / night  . Drug use: No  . Sexual activity: Not on file  Lifestyle  . Physical activity:    Days per week: Not on file     Minutes per session: Not on file  . Stress: Not on file  Relationships  . Social connections:    Talks on phone: Not on file    Gets together: Not on file    Attends religious service: Not on file  Active member of club or organization: Not on file    Attends meetings of clubs or organizations: Not on file    Relationship status: Not on file  . Intimate partner violence:    Fear of current or ex partner: Not on file    Emotionally abused: Not on file    Physically abused: Not on file    Forced sexual activity: Not on file  Other Topics Concern  . Not on file  Social History Narrative   Lives w/ wife            Social history is notable in that he is married and has 2 children. He now has a dog and walks the dog 1 mile 2 times a day. He is no longer using his treadmill. There is no tobacco use. He does drink alcohol.  Family History  Problem Relation Age of Onset  . Stroke Mother        TIA  . COPD Mother   . Hypertension Sister   . Prostate cancer Father        Father and brother   . Coronary artery disease Father        GM  . Prostate cancer Brother   . Diabetes Brother        B, GM, other fami;ly members   . Colon cancer Neg Hx   . Esophageal cancer Neg Hx   . Colon polyps Neg Hx   . Rectal cancer Neg Hx   . Stomach cancer Neg Hx     ROS General: Negative; No fevers, chills, or night sweats;  Positive for purposeful weight loss of 25 pounds. HEENT: Negative; No changes in vision or hearing, sinus congestion, difficulty swallowing Pulmonary: Negative; No cough, wheezing, shortness of breath, hemoptysis Cardiovascular: Negative; No chest pain, presyncope, syncope, palpitations GI: Negative; No nausea, vomiting, diarrhea, or abdominal pain GU: Mild erectile dysfunction; No dysuria, hematuria, or difficulty voiding Musculoskeletal: Negative; no myalgias, joint pain, or weakness Hematologic/Oncology: Negative; no easy bruising, bleeding Endocrine: Negative; no  heat/cold intolerance; no diabetes Neuro: Mild short-term memory loss; no changes in balance, headaches Skin: Negative; No rashes or skin lesions Psychiatric: Mild depression/anxiety Sleep: Positive for mild snoring which has significantly improved with his weight loss. no daytime sleepiness, hypersomnolence, bruxism, restless legs, hypnogognic hallucinations, no cataplexy Other comprehensive 14 point system review is negative.   PE BP (!) 122/54   Pulse (!) 51   Ht 5\' 10"  (1.778 m)   Wt 191 lb 12.8 oz (87 kg)   BMI 27.52 kg/m    Repeat blood pressure by me was 128/60  Wt Readings from Last 3 Encounters:  07/24/17 191 lb 12.8 oz (87 kg)  07/09/17 190 lb (86.2 kg)  06/01/17 189 lb (85.7 kg)   General: Alert, oriented, no distress.  Skin: normal turgor, no rashes, warm and dry HEENT: Normocephalic, atraumatic. Pupils equal round and reactive to light; sclera anicteric; extraocular muscles intact;  Nose without nasal septal hypertrophy Mouth/Parynx benign; Mallinpatti scale 3 Neck: No JVD, no carotid bruits; normal carotid upstroke Lungs: clear to ausculatation and percussion; no wheezing or rales Chest wall: without tenderness to palpitation Heart: PMI not displaced, RRR, s1 s2 normal, 1/6 systolic murmur, no diastolic murmur, no rubs, gallops, thrills, or heaves Abdomen: soft, nontender; no hepatosplenomehaly, BS+; abdominal aorta nontender and not dilated by palpation. Back: no CVA tenderness Pulses 2+ Musculoskeletal: full range of motion, normal strength, no joint deformities Extremities: no clubbing cyanosis or edema, Homan's sign  negative  Neurologic: grossly nonfocal; Cranial nerves grossly wnl Psychologic: Normal mood and affect    ECG (independently read by me): sinus bradycardia 51 bpm.  No ectopy.  Normal intervals.  March 2018 ECG (independently read by me): Sinus bradycardia 52 bpm.  Small nondiagnostic inferior Q waves.  Normal intervals  November 2016 ECG  (independently read by me):  Sinus bradycardia at 49 bpm.  Small inferior Q waves with preserved R waves.  February 2016 ECG (independently read by me): Sinus bradycardia 46 bpm.  No ectopy.  February 2015 ECG (independently read by me): Normal sinus rhythm at 62 beats per minute. Small nondiagnostic inferior Q waves. Observed R waves.  LABS:  BMP Latest Ref Rng & Units 06/25/2017 06/01/2017 06/27/2016  Glucose 65 - 99 mg/dL 110(H) 110(H) 108(H)  BUN 8 - 27 mg/dL 19 22 16   Creatinine 0.76 - 1.27 mg/dL 0.81 0.96 0.88  BUN/Creat Ratio 10 - 24 23 23  -  Sodium 134 - 144 mmol/L 141 140 140  Potassium 3.5 - 5.2 mmol/L 5.1 5.5(H) 4.8  Chloride 96 - 106 mmol/L 104 101 105  CO2 20 - 29 mmol/L 24 23 27   Calcium 8.6 - 10.2 mg/dL 9.3 9.4 9.1      Component Value Date/Time   PROT 6.9 06/01/2017 0834   ALBUMIN 4.2 06/01/2017 0834   AST 25 06/01/2017 0834   ALT 19 06/01/2017 0834   ALKPHOS 74 06/01/2017 0834   BILITOT 0.2 06/01/2017 0834    CBC Latest Ref Rng & Units 02/05/2016 05/29/2014 05/20/2013  WBC 4.0 - 10.5 K/uL 7.4 7.1 5.7  Hemoglobin 13.0 - 17.0 g/dL 13.5 14.3 14.3  Hematocrit 39.0 - 52.0 % 39.4 41.7 42.0  Platelets 150.0 - 400.0 K/uL 236.0 252 234   Lab Results  Component Value Date   MCV 93.0 02/05/2016   MCV 91.2 05/29/2014   MCV 93.3 05/20/2013   Lab Results  Component Value Date   TSH 2.100 06/01/2017   Lab Results  Component Value Date   HGBA1C 6.1 (H) 06/01/2017    BNP No results found for: PROBNP  Lipid Panel     Component Value Date/Time   CHOL 182 06/01/2017 0834   TRIG 222 (H) 06/01/2017 0834   HDL 64 06/01/2017 0834   CHOLHDL 2.8 06/01/2017 0834   CHOLHDL 2.6 06/27/2016 0831   VLDL 36 (H) 06/27/2016 0831   LDLCALC 74 06/01/2017 0834     RADIOLOGY: No results found.  IMPRESSION:  1. CAD in native artery   2. Hyperlipidemia with target LDL less than 70   3. Sinus bradycardia   4. Palpitations   5. ERECTILE DYSFUNCTION   6. Screening for  prostate cancer   7. Decreased urine stream   8. Insulin resistance     ASSESSMENT AND PLAN:  Mr. Alves is a 67 year old Caucasian male who is 22 years status post his inferior wall myocardial infarction at which time he underwent acute intervention to a totally occluded RCA and staged intervention to circumflex marginal vessel in January 1997. His last catheterization in 2001 showed plaque regression on his aggressive medical regimen.  .  Sinuses initial intervention, he has been aggressively treated and has remained stable without recurrent anginal symptoms.  His ECG reveals sinus bradycardia and does not reveal evidence for his prior infarction.  He remains asymptomatic.  He walks his dog.  His blood pressure today is controlled on amlodipine 5 mg, Toprol-XL 50 mg, and lisinopril 20 mg.  Earlier this  year laboratory had shown mild potassium elevation at 5.5.  We discussed avoidance of high potassium foods.  Repeat potassium was improved at 5.1.  He does have probable insulin resistance with consistently mildly elevated.  Hemoglobin A1c most recently at 6.1.  He has a history of hyperlipidemia.  He continues to take atorvastatin 40 mg.  His most recent laboratory shows his LDL at 74.  Triglycerides and VLDL were mildly elevated and we discussed avoidance of sweets and sugars.  He admits to frequent wine use, which may be a contributor.he notes continued mild short-term memory loss.  I have suggested that if this progresses consider neurologic evaluation.  However, I also suggested a trial of Prevagen to see if this can slightly improved some short-term memory deficits. His weight is increased 3 pounds from last year.  BMI is 27.52.  Now that the weather hopefully will be warmer and arrangements side subsided, we discussed increased activity.  A complete set.  A repeat laboratory will be rechecked in one year and I will see him back for follow-up evaluation.  Time spent: 25 minutes  Troy Sine,  MD, [facility]  07/24/2017 8:32 AM

## 2017-07-24 NOTE — Patient Instructions (Addendum)
No change with medications    Continue with watching diet intake   Labs in 12 months - WILL MAIL LABSLIP CMP  LIPID  TSH CBC PSA HGBAIC    Your physician wants you to follow-up in 12 months with Dr Claiborne Billings AFTER  ANNUAL LAB WORK You will receive a reminder letter in the mail two months in advance. If you don't receive a letter, please call our office to schedule the follow-up appointment.   If you need a refill on your cardiac medications before your next appointment, please call your pharmacy.

## 2017-07-28 ENCOUNTER — Other Ambulatory Visit: Payer: Self-pay

## 2017-07-28 ENCOUNTER — Other Ambulatory Visit: Payer: Self-pay | Admitting: Internal Medicine

## 2017-07-28 MED ORDER — AZELASTINE HCL 0.1 % NA SOLN: 2.0000 | Freq: Two times a day (BID) | NASAL | 3 refills | Status: DC | PRN

## 2017-07-29 ENCOUNTER — Other Ambulatory Visit: Payer: Self-pay | Admitting: Cardiovascular Disease

## 2017-12-02 ENCOUNTER — Other Ambulatory Visit: Payer: Self-pay | Admitting: Cardiovascular Disease

## 2018-05-24 ENCOUNTER — Other Ambulatory Visit: Payer: Self-pay | Admitting: Internal Medicine

## 2018-07-01 ENCOUNTER — Telehealth: Payer: Self-pay | Admitting: *Deleted

## 2018-07-01 DIAGNOSIS — R002 Palpitations: Secondary | ICD-10-CM

## 2018-07-01 DIAGNOSIS — E785 Hyperlipidemia, unspecified: Secondary | ICD-10-CM

## 2018-07-01 DIAGNOSIS — E8881 Metabolic syndrome: Secondary | ICD-10-CM

## 2018-07-01 DIAGNOSIS — Z125 Encounter for screening for malignant neoplasm of prostate: Secondary | ICD-10-CM

## 2018-07-01 DIAGNOSIS — I2510 Atherosclerotic heart disease of native coronary artery without angina pectoris: Secondary | ICD-10-CM

## 2018-07-01 DIAGNOSIS — F528 Other sexual dysfunction not due to a substance or known physiological condition: Secondary | ICD-10-CM

## 2018-07-01 DIAGNOSIS — R001 Bradycardia, unspecified: Secondary | ICD-10-CM

## 2018-07-01 DIAGNOSIS — R39198 Other difficulties with micturition: Secondary | ICD-10-CM

## 2018-07-01 NOTE — Telephone Encounter (Signed)
-----   Message from Raiford Simmonds, RN sent at 07/24/2017  8:44 AM EDT ----- LABS DUE 07/24/17 CMP,CBC,PSA,LIPID TSH,HGBAIC, @MAIL  06/28/18

## 2018-07-01 NOTE — Telephone Encounter (Signed)
Mail Labslip and letter to patient.

## 2018-08-04 ENCOUNTER — Other Ambulatory Visit: Payer: Self-pay | Admitting: Internal Medicine

## 2018-08-30 DIAGNOSIS — R002 Palpitations: Secondary | ICD-10-CM | POA: Diagnosis not present

## 2018-08-30 DIAGNOSIS — R39198 Other difficulties with micturition: Secondary | ICD-10-CM | POA: Diagnosis not present

## 2018-08-30 DIAGNOSIS — E785 Hyperlipidemia, unspecified: Secondary | ICD-10-CM | POA: Diagnosis not present

## 2018-08-30 DIAGNOSIS — Z125 Encounter for screening for malignant neoplasm of prostate: Secondary | ICD-10-CM | POA: Diagnosis not present

## 2018-08-30 DIAGNOSIS — R001 Bradycardia, unspecified: Secondary | ICD-10-CM | POA: Diagnosis not present

## 2018-08-30 DIAGNOSIS — F528 Other sexual dysfunction not due to a substance or known physiological condition: Secondary | ICD-10-CM | POA: Diagnosis not present

## 2018-08-30 DIAGNOSIS — E8881 Metabolic syndrome: Secondary | ICD-10-CM | POA: Diagnosis not present

## 2018-08-30 DIAGNOSIS — I2510 Atherosclerotic heart disease of native coronary artery without angina pectoris: Secondary | ICD-10-CM | POA: Diagnosis not present

## 2018-08-31 ENCOUNTER — Encounter: Payer: Self-pay | Admitting: Cardiovascular Disease

## 2018-08-31 ENCOUNTER — Other Ambulatory Visit: Payer: Self-pay

## 2018-08-31 ENCOUNTER — Telehealth: Payer: Self-pay | Admitting: Cardiovascular Disease

## 2018-08-31 ENCOUNTER — Ambulatory Visit (INDEPENDENT_AMBULATORY_CARE_PROVIDER_SITE_OTHER): Payer: Medicare Other | Admitting: Cardiovascular Disease

## 2018-08-31 VITALS — BP 172/68 | HR 43 | Ht 70.0 in | Wt 193.4 lb

## 2018-08-31 DIAGNOSIS — I2510 Atherosclerotic heart disease of native coronary artery without angina pectoris: Secondary | ICD-10-CM

## 2018-08-31 DIAGNOSIS — R42 Dizziness and giddiness: Secondary | ICD-10-CM | POA: Diagnosis not present

## 2018-08-31 DIAGNOSIS — E785 Hyperlipidemia, unspecified: Secondary | ICD-10-CM | POA: Diagnosis not present

## 2018-08-31 DIAGNOSIS — R001 Bradycardia, unspecified: Secondary | ICD-10-CM

## 2018-08-31 DIAGNOSIS — I10 Essential (primary) hypertension: Secondary | ICD-10-CM | POA: Diagnosis not present

## 2018-08-31 DIAGNOSIS — E8881 Metabolic syndrome: Secondary | ICD-10-CM

## 2018-08-31 LAB — COMPREHENSIVE METABOLIC PANEL
ALT: 19 IU/L (ref 0–44)
AST: 20 IU/L (ref 0–40)
Albumin/Globulin Ratio: 1.7 (ref 1.2–2.2)
Albumin: 4.2 g/dL (ref 3.8–4.8)
Alkaline Phosphatase: 78 IU/L (ref 39–117)
BUN/Creatinine Ratio: 19 (ref 10–24)
BUN: 16 mg/dL (ref 8–27)
Bilirubin Total: 0.3 mg/dL (ref 0.0–1.2)
CO2: 22 mmol/L (ref 20–29)
Calcium: 9.2 mg/dL (ref 8.6–10.2)
Chloride: 105 mmol/L (ref 96–106)
Creatinine, Ser: 0.85 mg/dL (ref 0.76–1.27)
GFR calc Af Amer: 103 mL/min/{1.73_m2} (ref >59)
GFR calc non Af Amer: 89 mL/min/{1.73_m2} (ref >59)
Globulin, Total: 2.5 g/dL (ref 1.5–4.5)
Glucose: 120 mg/dL — ABNORMAL HIGH (ref 65–99)
Potassium: 5.6 mmol/L — ABNORMAL HIGH (ref 3.5–5.2)
Sodium: 140 mmol/L (ref 134–144)
Total Protein: 6.7 g/dL (ref 6.0–8.5)

## 2018-08-31 LAB — LIPID PANEL
Chol/HDL Ratio: 2.8 ratio (ref 0.0–5.0)
Cholesterol, Total: 167 mg/dL (ref 100–199)
HDL: 59 mg/dL (ref >39)
LDL Calculated: 85 mg/dL (ref 0–99)
Triglycerides: 117 mg/dL (ref 0–149)
VLDL Cholesterol Cal: 23 mg/dL (ref 5–40)

## 2018-08-31 LAB — CBC
HEMOGLOBIN: 13.1 g/dL (ref 13.0–17.7)
Hematocrit: 39.4 % (ref 37.5–51.0)
MCH: 32.4 pg (ref 26.6–33.0)
MCHC: 33.2 g/dL (ref 31.5–35.7)
MCV: 98 fL — AB (ref 79–97)
Platelets: 253 10*3/uL (ref 150–450)
RBC: 4.04 x10E6/uL — AB (ref 4.14–5.80)
RDW: 12.9 % (ref 11.6–15.4)
WBC: 6.3 10*3/uL (ref 3.4–10.8)

## 2018-08-31 LAB — TSH: TSH: 2.32 u[IU]/mL (ref 0.450–4.500)

## 2018-08-31 LAB — PSA: Prostate Specific Ag, Serum: 1.8 ng/mL (ref 0.0–4.0)

## 2018-08-31 LAB — HEMOGLOBIN A1C
Est. average glucose Bld gHb Est-mCnc: 117 mg/dL
Hgb A1c MFr Bld: 5.7 % — ABNORMAL HIGH (ref 4.8–5.6)

## 2018-08-31 MED ORDER — METOPROLOL SUCCINATE ER 25 MG PO TB24: 25.0000 mg | ORAL_TABLET | Freq: Every day | ORAL | 1 refills | Status: DC

## 2018-08-31 MED ORDER — LISINOPRIL 20 MG PO TABS: 10.0000 mg | ORAL_TABLET | Freq: Every day | ORAL | 0 refills | Status: DC

## 2018-08-31 MED ORDER — AMLODIPINE BESYLATE 5 MG PO TABS: 10.0000 mg | ORAL_TABLET | Freq: Every day | ORAL | 3 refills | Status: DC

## 2018-08-31 NOTE — Progress Notes (Signed)
Patient ID: Dakota Gibbs, male   DOB: Nov 19, 1950, 68 y.o.   MRN: [id number]    PCP: Dr. Kathlene November  HPI: Dakota Gibbs is a 68 y.o. male who presents to the office today for a 71 month cardiology evaluation.  Dakota Gibbs suffered an inferior wall myocardial infarction on 05/10/1995 and underwent acute intervention to a totally occluded RCA. He had more distal disease in the posterolateral vessel which was treated medically. Several days later he underwent staged intervention to circumflex marginal vessel. His last cardiac catheterization was in 2001 which did not show restenosis and actually showed coronary artery disease regression.  He has been aggressively treated since his initial event.  When I saw him several  years ago he had noticed several episodes in the early morning while sleeping that he develops nocturnal palpitations. Upon further questioning he does snore and his snoring is more significantly abnormal particularly after drinking alcohol. He wakes up one to 2 times per night.  He was started on Celexa for anxiety/depression which has helped.  He has a history of hyperlipidemia and has been tolerating Lipitor. He also has GERD, and hypertension.   Over the past year, he admits to a 25 pound weight loss. In contrast to his previous diet many years ago when he tried the [employer], his current diet is eating less along with few carbohydrates. His weight is reduced from 208-180 3 pounds. He is sleeping significantly improved. He is not aware of any snoring.  He continues to take amlodipine 5 mg, lisinopril 20, no grams, Toprol-XL 50 mg for hypertension and CAD.  He is on Zantac 150 mg for dyspepsia/GERD. He has more energy.  Lab work done by his primary physician had shown improvement in his hemoglobin A1c at 6.0, improved from 6.6.   I last saw him in March 2018.  At that time he continues to do well an was working in the executive recruiting business.  He denied chest pain  or shortness of breath.  At times he believes he may be having some short-term memory loss.    Since I last saw him, he had continued to be active.  However, with the COVID-19 pandemic, he has not been going to the gym.  Over the past month, he has began to notice episodes of transient lightheadedness and dizziness which typically occur during physical exertion.  He denies any frank syncope.  He denies exertional chest pain or palpitations.  He has been noticing that his blood pressure has been in the 140 to 150 range.  He has continued to be on Toprol-XL 50 mg, amlodipine 5 mg, and lisinopril 20 mg.  He is on atorvastatin for hyperlipidemia.  He continues to be on Celexa.  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  He now presents for follow-up evaluation.  Anxiety   Past Medical History:  Diagnosis Date   Allergy    enviornmental   Anxiety    Anxiety and depression 10/25/2012   Back pain    L4-L5 bulging disc, L5-S1 - bulging disc, pinched nerve in neck   CAD (coronary artery disease)      Dr Claiborne Billings, s/p stents----- sees cards yearly    Cancer [facility])    basal cell; carcinoma removed x3 and SCC   Depression    Diabetes mellitus    pt denies   GERD (gastroesophageal  reflux disease)    History of shingles 2009   Myocardial infarction [facility]) 1997    Past Surgical History:  Procedure Laterality Date   ANGIOPLASTY  1997   basel cell     CARDIAC CATHETERIZATION     10/04/1999   DOPPLER ECHOCARDIOGRAPHY  07/09/2006   EF 50 to 55 %, LA mildy dilated   HERNIA REPAIR  1951   RIGHT   KNEE ARTHROSCOPY  2001   R   NM MYOCAR PERF WALL MOTION  08/22/2011   Mets 13,low risk study   WRIST SURGERY  1984   right    No Known Allergies  Current Outpatient Medications  Medication Sig Dispense Refill   amLODipine (NORVASC) 5 MG tablet TAKE 1 TABLET EVERY DAY 90 tablet 3    aspirin EC 81 MG tablet Take 1 tablet (81 mg total) by mouth daily. 90 tablet 3   atorvastatin (LIPITOR) 40 MG tablet TAKE 1 TABLET EVERY DAY AT 6 PM 90 tablet 3   azelastine (ASTELIN) 0.1 % nasal spray Place 2 sprays into both nostrils 2 (two) times daily as needed for rhinitis or allergies. Use in each nostril as directed 90 mL 3   cetirizine (ZYRTEC) 10 MG tablet Take 10 mg by mouth daily.     citalopram (CELEXA) 20 MG tablet Take 1.5 tablets (30 mg total) by mouth daily. 135 tablet 0   lisinopril (PRINIVIL,ZESTRIL) 20 MG tablet TAKE 1 TABLET EVERY DAY 90 tablet 3   Lutein 20 MG TABS Take 20 mg by mouth daily.     metoprolol succinate (TOPROL-XL) 50 MG 24 hr tablet TAKE 1 TABLET AT BEDTIME WITH OR IMMEDIATELY FOLLOWING A MEAL 90 tablet 3   Multiple Vitamin (MULTIVITAMIN) tablet Take 1 tablet by mouth daily.       nitroGLYCERIN (NITROSTAT) 0.4 MG SL tablet Place 1 tablet (0.4 mg total) under the tongue every 5 (five) minutes as needed for chest pain. 25 tablet 1   pantoprazole (PROTONIX) 40 MG tablet Take 1 tablet (40 mg total) by mouth daily. 30 tablet 3   ranitidine (ZANTAC) 150 MG capsule Take 150 mg by mouth every evening.      sildenafil (VIAGRA) 100 MG tablet Take 100 mg by mouth daily as needed.       vitamin C (ASCORBIC ACID) 500 MG tablet Take 500 mg by mouth daily.       vitamin E 400 UNIT capsule Take 400 Units by mouth daily.       Current Facility-Administered Medications  Medication Dose Route Frequency Provider Last Rate Last Dose   0.9 %  sodium chloride infusion  500 mL Intravenous Continuous Armbruster, Carlota Raspberry, MD        Social History   Socioeconomic History   Marital status: Married    Spouse name: Not on file   Number of children: 2   Years of education: Not on file   Highest education level: Not on file  Occupational History   Occupation: executive recruter   Social Needs   Financial resource strain: Not on file   Food insecurity:      Worry: Not on file    Inability: Not on file   Transportation needs:    Medical: Not on file    Non-medical: Not on file  Tobacco Use   Smoking status: Light Tobacco Smoker    Types: Cigars    Last attempt to quit: 03/19/1992    Years since quitting: 26.4   Smokeless tobacco: Never  Used  Substance and Sexual Activity   Alcohol use: Yes    Comment:  5 glasses wine / night   Drug use: No   Sexual activity: Not on file  Lifestyle   Physical activity:    Days per week: Not on file    Minutes per session: Not on file   Stress: Not on file  Relationships   Social connections:    Talks on phone: Not on file    Gets together: Not on file    Attends religious service: Not on file    Active member of club or organization: Not on file    Attends meetings of clubs or organizations: Not on file    Relationship status: Not on file   Intimate partner violence:    Fear of current or ex partner: Not on file    Emotionally abused: Not on file    Physically abused: Not on file    Forced sexual activity: Not on file  Other Topics Concern   Not on file  Social History Narrative   Lives w/ wife            Social history is notable in that he is married and has 2 children. He now has a dog and walks the dog 1 mile 2 times a day. He is no longer using his treadmill. There is no tobacco use. He does drink alcohol.  Family History  Problem Relation Age of Onset   Stroke Mother        TIA   COPD Mother    Hypertension Sister    Prostate cancer Father        Father and brother    Coronary artery disease Father        GM   Prostate cancer Brother    Diabetes Brother        B, GM, other fami;ly members    Colon cancer Neg Hx    Esophageal cancer Neg Hx    Colon polyps Neg Hx    Rectal cancer Neg Hx    Stomach cancer Neg Hx     ROS General: Negative; No fevers, chills, or night sweats;  Positive for previous purposeful weight loss of 25 pounds. HEENT:  Negative; No changes in vision or hearing, sinus congestion, difficulty swallowing Pulmonary: Negative; No cough, wheezing, shortness of breath, hemoptysis Cardiovascular: See HPI GI: Negative; No nausea, vomiting, diarrhea, or abdominal pain GU: Mild erectile dysfunction; No dysuria, hematuria, or difficulty voiding Musculoskeletal: Negative; no myalgias, joint pain, or weakness Hematologic/Oncology: Negative; no easy bruising, bleeding Endocrine: Negative; no heat/cold intolerance; no diabetes Neuro: Mild short-term memory loss; no changes in balance, headaches Skin: Negative; No rashes or skin lesions Psychiatric: Mild depression/anxiety Sleep: Positive for mild snoring which has significantly improved with his weight loss. no daytime sleepiness, hypersomnolence, bruxism, restless legs, hypnogognic hallucinations, no cataplexy Other comprehensive 14 point system review is negative.   PE BP (!) 172/68    Pulse (!) 43    Ht 5' 10"  (1.778 m)    Wt 193 lb 6.4 oz (87.7 kg)    BMI 27.75 kg/m    Repeat blood pressure by me was 174/72 supine and 178/70 standing  Wt Readings from Last 3 Encounters:  08/31/18 193 lb 6.4 oz (87.7 kg)  07/24/17 191 lb 12.8 oz (87 kg)  07/09/17 190 lb (86.2 kg)   General: Alert, oriented, no distress.  Skin: normal turgor, no rashes, warm and dry HEENT: Normocephalic, atraumatic. Pupils equal  round and reactive to light; sclera anicteric; extraocular muscles intact;  Nose without nasal septal hypertrophy Mouth/Parynx: Not assessed since he is wearing a mask with a COVID pandemic Neck: No JVD, no carotid bruits; normal carotid upstroke Lungs: clear to ausculatation and percussion; no wheezing or rales Chest wall: without tenderness to palpitation Heart: PMI not displaced, RRR, s1 s2 normal, 1/6 systolic murmur, no diastolic murmur, no rubs, gallops, thrills, or heaves Abdomen: soft, nontender; no hepatosplenomehaly, BS+; abdominal aorta nontender and not  dilated by palpation. Back: no CVA tenderness Pulses 2+ Musculoskeletal: full range of motion, normal strength, no joint deformities Extremities: no clubbing cyanosis or edema, Homan's sign negative  Neurologic: grossly nonfocal; Cranial nerves grossly wnl Psychologic: Normal mood and affect  ECG (independently read by me): Marked sinus bradycardia at 43 bpm.  No ST segment changes.  PR interval 148 ms, QTc interval 419 ms.  March 2019 ECG (independently read by me): sinus bradycardia 51 bpm.  No ectopy.  Normal intervals.  March 2018 ECG (independently read by me): Sinus bradycardia 52 bpm.  Small nondiagnostic inferior Q waves.  Normal intervals  November 2016 ECG (independently read by me):  Sinus bradycardia at 49 bpm.  Small inferior Q waves with preserved R waves.  February 2016 ECG (independently read by me): Sinus bradycardia 46 bpm.  No ectopy.  February 2015 ECG (independently read by me): Normal sinus rhythm at 62 beats per minute. Small nondiagnostic inferior Q waves. Observed R waves.  LABS:  BMP Latest Ref Rng & Units 08/30/2018 06/25/2017 06/01/2017  Glucose 65 - 99 mg/dL 120(H) 110(H) 110(H)  BUN 8 - 27 mg/dL 16 19 22   Creatinine 0.76 - 1.27 mg/dL 0.85 0.81 0.96  BUN/Creat Ratio 10 - 24 19 23 23   Sodium 134 - 144 mmol/L 140 141 140  Potassium 3.5 - 5.2 mmol/L 5.6(H) 5.1 5.5(H)  Chloride 96 - 106 mmol/L 105 104 101  CO2 20 - 29 mmol/L 22 24 23   Calcium 8.6 - 10.2 mg/dL 9.2 9.3 9.4      Component Value Date/Time   PROT 6.7 08/30/2018 0831   ALBUMIN 4.2 08/30/2018 0831   AST 20 08/30/2018 0831   ALT 19 08/30/2018 0831   ALKPHOS 78 08/30/2018 0831   BILITOT 0.3 08/30/2018 0831    CBC Latest Ref Rng & Units 08/30/2018 02/05/2016 05/29/2014  WBC 3.4 - 10.8 x10E3/uL 6.3 7.4 7.1  Hemoglobin 13.0 - 17.7 g/dL 13.1 13.5 14.3  Hematocrit 37.5 - 51.0 % 39.4 39.4 41.7  Platelets 150 - 450 x10E3/uL 253 236.0 252   Lab Results  Component Value Date   MCV 98 (H) 08/30/2018    MCV 93.0 02/05/2016   MCV 91.2 05/29/2014   Lab Results  Component Value Date   TSH 2.320 08/30/2018   Lab Results  Component Value Date   HGBA1C 5.7 (H) 08/30/2018    BNP No results found for: PROBNP  Lipid Panel     Component Value Date/Time   CHOL 167 08/30/2018 0831   TRIG 117 08/30/2018 0831   HDL 59 08/30/2018 0831   CHOLHDL 2.8 08/30/2018 0831   CHOLHDL 2.6 06/27/2016 0831   VLDL 36 (H) 06/27/2016 0831   LDLCALC 85 08/30/2018 0831    RADIOLOGY: No results found.  IMPRESSION:  No diagnosis found.  ASSESSMENT AND PLAN:  Dakota Gibbs is a 68 year old Caucasian male who is 23 years status post his inferior wall myocardial infarction at which time he underwent acute intervention to a  totally occluded RCA and staged intervention to circumflex marginal vessel in January 1997. His last catheterization in 2001 showed plaque regression on his aggressive medical regimen.  He has been aggressively treated and has remained stable without recurrent anginal symptoms.  His ECGs have shown sinus bradycardia and does not reveal evidence for his prior infarction.  When last seen 1 year ago his blood pressure was stable on amlodipine 5 mg, Toprol-XL 50 mg, and lisinopril 20 mg.  At that time he also had a potassium on 1 occasion of 5.5 which improved to 5.1.  In the past he was felt also to have probable insulin resistance with consistently mildly elevated hemoglobin A1c's in the range of 6.1.  1 year previous LDL cholesterol was 74.  Upon presentation today he has noticed episodes of lightheadedness dizziness and transient blurred vision over the past month.  He believes he these episodes are short-lived but they may have occurred approximately 20 times.  He denies any significant change with going from supine to standing.  His ECG today shows marked sinus bradycardia at 43 bpm.  He has been on Toprol-XL 50 mg nightly dizziness daily.  Habits blood pressure however is elevated and I will  further titrate amlodipine to 10 mg.  I reviewed recent laboratory from yesterday which now shows a potassium again elevated at 5.6.  He does drink or chew sometimes per week.  He does not routinely eat a lot of bananas.  He is unaware of eating a lot of potassium containing foods.  Because of his potassium at this level I will decrease his lisinopril from 20 mg down to 10 mg.  He will undergo a follow-up be met this Friday for reassessment.  He will monitor his blood pressure.  I will also schedule him to undergo a 2D echo Doppler study as well as carotid duplex imaging and these will be done hopefully soon once the elective procedure study obtained.  I have recommended a follow-up visit in 2 to 3 weeks for reassessment.  He continues to be on atorvastatin 40 mg for hyperlipidemia.  Lipid studies were improved with reference to his triglycerides reducing from 222 down to 117 with improve diet.  However, LDL cholesterol has slightly increased to 85.  Total cholesterol is 167.  If LDL cholesterol remains elevated on a subsequent reevaluation, atorvastatin may need to be further increased to 80 mg or Zetia 10 mg added to his regimen to achieve a target LDL of less than 70.  His most recent hemoglobin A1c has improved to 5.9 with his improved diet.  Time spent: 30 minutes Troy Sine, MD, [facility]  08/31/2018 10:20 AM

## 2018-08-31 NOTE — Telephone Encounter (Signed)
  Patient wants [facility] scripts cancelled and wants till next week to get his scripts.

## 2018-08-31 NOTE — Patient Instructions (Signed)
Medication Instructions:  Decrease Metoprolol to 1/2 tablet (25 mg) Decrease Lisinopril to 1/2 tablet (10 mg) Increase Amlodipine to 2 tablets (10 mg)  If you need a refill on your cardiac medications before your next appointment, please call your pharmacy.   Lab work: BMET on Friday If you have labs (blood work) drawn today and your tests are completely normal, you will receive your results only by: Marland Kitchen MyChart Message (if you have MyChart) OR . A paper copy in the mail If you have any lab test that is abnormal or we need to change your treatment, we will call you to review the results.  Testing/Procedures: Echocardiogram - Your physician has requested that you have an echocardiogram. Echocardiography is a painless test that uses sound waves to create images of your heart. It provides your doctor with information about the size and shape of your heart and how well your heart's chambers and valves are working. This procedure takes approximately one hour. There are no restrictions for this procedure. This will be performed at our [facility] location - [address].  Your physician has requested that you have a carotid duplex. This test is an ultrasound of the carotid arteries in your neck. It looks at blood flow through these arteries that supply the brain with blood. Allow one hour for this exam. There are no restrictions or special instructions.   Follow-Up: At [facility], you and your health needs are our priority.  As part of our continuing mission to provide you with exceptional heart care, we have created designated Provider Care Teams.  These Care Teams include your primary Cardiologist (physician) and Advanced Practice Providers (APPs -  Physician Assistants and Nurse Practitioners) who all work together to provide you with the care you need, when you need it. You will need a follow up appointment in 2-3 weeks.   You may see Dr.Kelly or one of the following Advanced  Practice Providers on your designated Care Team: Almyra Deforest, [place] . Fabian Sharp, PA-C  Any Other Special Instructions Will Be Listed Below (If Applicable).    Potassium Content of Foods  Potassium is a mineral found in many foods and drinks. It affects how the heart works, and helps keep fluids and minerals balanced in the body. The amount of potassium you need each day depends on your age and any medical conditions you may have. Talk to your health care provider or dietitian about how much potassium you need. The following lists of foods provide the general serving size for foods and the approximate amount of potassium in each serving, listed in milligrams (mg). Actual values may vary depending on the product and how it is processed. High in potassium The following foods and beverages have 200 mg or more of potassium per serving:  Apricots (raw) - 2 have 200 mg of potassium.  Apricots (dry) - 5 have 200 mg of potassium.  Artichoke - 1 medium has 345 mg of potassium.  Avocado -  fruit has 245 mg of potassium.  Banana - 1 medium fruit has 425 mg of potassium.  [place] or baked beans (canned) -  cup has 280 mg of potassium.  White beans (canned) -  cup has 595 mg potassium.  Beef roast - 3 oz has 320 mg of potassium.  Ground beef - 3 oz has 270 mg of potassium.  Beets (raw or cooked) -  cup has 260 mg of potassium.  Bran muffin - 2 oz has  300 mg of potassium.  Broccoli (cooked) -  cup has 230 mg of potassium.  Brussels sprouts -  cup has 250 mg of potassium.  Cantaloupe -  cup has 215 mg of potassium.  Cereal, 100% bran -  cup has 200-400 mg of potassium.  Cheeseburger -1 single fast food burger has 225-400 mg of potassium.  Chicken - 3 oz has 220 mg of potassium.  Clams (canned) - 3 oz has 535 mg of potassium.  Crab - 3 oz has 225 mg of potassium.  Dates - 5 have 270 mg of potassium.  Dried beans and peas -  cup has 300-475 mg of potassium.  Figs (dried) -  2 have 260 mg of potassium.  Fish (halibut, tuna, cod, snapper) - 3 oz has 480 mg of potassium.  Fish (salmon, haddock, swordfish, perch) - 3 oz has 300 mg of potassium.  Fish (tuna, canned) - 3 oz has 200 mg of potassium.  [place] fries (fast food) - 3 oz has 470 mg of potassium.  Granola with fruit and nuts -  cup has 200 mg of potassium.  Grapefruit juice -  cup has 200 mg of potassium.  Honeydew melon -  cup has 200 mg of potassium.  Kale (raw) - 1 cup has 300 mg of potassium.  Kiwi - 1 medium fruit has 240 mg of potassium.  Kohlrabi, rutabaga, parsnips -  cup has 280 mg of potassium.  Lentils -  cup has 365 mg of potassium.  Mango - 1 each has 325 mg of potassium.  Milk (nonfat, low-fat, whole, buttermilk) - 1 cup has 350-380 mg of potassium.  Milk (chocolate) - 1 cup has 420 mg of potassium  Molasses - 1 Tbsp has 295 mg of potassium.  Mushrooms -  cup has 280 mg of potassium.  Nectarine - 1 each has 275 mg of potassium.  Nuts (almonds, peanuts, hazelnuts, [place], cashew, mixed) - 1 oz has 200 mg of potassium.  Nuts (pistachios) - 1 oz has 295 mg of potassium.  Orange - 1 fruit has 240 mg of potassium.  Orange juice -  cup has 235 mg of potassium.  Papaya -  medium fruit has 390 mg of potassium.  Peanut butter (chunky) - 2 Tbsp has 240 mg of potassium.  Peanut butter (smooth) - 2 Tbsp has 210 mg of potassium.  Pear - 1 medium (200 mg) of potassium.  Pomegranate - 1 whole fruit has 400 mg of potassium.  Pomegranate juice -  cup has 215 mg of potassium.  Pork - 3 oz has 350 mg of potassium.  Potato chips (salted) - 1 oz has 465 mg of potassium.  Potato (baked with skin) - 1 medium has 925 mg of potassium.  Potato (boiled) -  cup has 255 mg of potassium.  Potato (Mashed) -  cup has 330 mg of potassium.  Prune juice -  cup has 370 mg of potassium.  Prunes - 5 have 305 mg of potassium.  Pudding (chocolate) -  cup has 230 mg of  potassium.  Pumpkin (canned) -  cup has 250 mg of potassium.  Raisins (seedless) -  cup has 270 mg of potassium.  Seeds (sunflower or pumpkin) - 1 oz has 240 mg of potassium.  Soy milk - 1 cup has 300 mg of potassium.  Spinach (cooked) - 1/2 cup has 420 mg of potassium.  Spinach (canned) -  cup has 370 mg of potassium.  Sweet potato (baked with skin) -  1 medium has 450 mg of potassium.  Swiss chard -  cup has 480 mg of potassium.  Tomato or vegetable juice -  cup has 275 mg of potassium.  Tomato (sauce or puree) -  cup has 400-550 mg of potassium.  Tomato (raw) - 1 medium has 290 mg of potassium.  Tomato (canned) -  cup has 200-300 mg of potassium.  [place] - 3 oz has 250 mg of potassium.  Wheat germ - 1 oz has 250 mg of potassium.  Winter squash -  cup has 250 mg of potassium.  Yogurt (plain or fruited) - 6 oz has 260-435 mg of potassium.  Zucchini -  cup has 220 mg of potassium. Moderate in potassium The following foods and beverages have 50-200 mg of potassium per serving:  Apple - 1 fruit has 150 mg of potassium  Apple juice -  cup has 150 mg of potassium  Applesauce -  cup has 90 mg of potassium  Apricot nectar -  cup has 140 mg of potassium  Asparagus (small spears) -  cup has 155 mg of potassium  Asparagus (large spears) - 6 have 155 mg of potassium  Bagel (cinnamon raisin) - 1 four-inch bagel has 130 mg of potassium  Bagel (egg or plain) - 1 four- inch bagel has 70 mg of potassium  Beans (green) -  cup has 90 mg of potassium  Beans (yellow) -  cup has 190 mg of potassium  Beer, regular - 12 oz has 100 mg of potassium  Beets (canned) -  cup has 125 mg of potassium  Blackberries -  cup has 115 mg of potassium  Blueberries -  cup has 60 mg of potassium  Bread (whole wheat) - 1 slice has 70 mg of potassium  Broccoli (raw) -  cup has 145 mg of potassium  Cabbage -  cup has 150 mg of potassium  Carrots (cooked or raw) -   cup has 180 mg of potassium  Cauliflower (raw) -  cup has 150 mg of potassium  Celery (raw) -  cup has 155 mg of potassium  Cereal, bran flakes -  cup has 120-150 mg of potassium  Cheese (cottage) -  cup has 110 mg of potassium  Cherries - 10 have 150 mg of potassium  Chocolate - 1 oz bar has 165 mg of potassium  Coffee (brewed) - 6 oz has 90 mg of potassium  Corn -  cup or 1 ear has 195 mg of potassium  Cucumbers -  cup has 80 mg of potassium  Egg - 1 large egg has 60 mg of potassium  Eggplant -  cup has 60 mg of potassium  Endive (raw) -  cup has 80 mg of potassium  English muffin - 1 has 65 mg of potassium  Fish (ocean perch) - 3 oz has 192 mg of potassium  Frankfurter, beef or pork - 1 has 75 mg of potassium  Fruit cocktail -  cup has 115 mg of potassium  Grape juice -  cup has 170 mg of potassium  Grapefruit -  fruit has 175 mg of potassium  Grapes -  cup has 155 mg of potassium  Greens: kale, turnip, collard -  cup has 110-150 mg of potassium  Ice cream or frozen yogurt (chocolate) -  cup has 175 mg of potassium  Ice cream or frozen yogurt (vanilla) -  cup has 120-150 mg of potassium  Lemons, limes - 1 each has 80 mg of  potassium  Lettuce - 1 cup has 100 mg of potassium  Mixed vegetables -  cup has 150 mg of potassium  Mushrooms, raw -  cup has 110 mg of potassium  Nuts (walnuts, pecans, or macadamia) - 1 oz has 125 mg of potassium  Oatmeal -  cup has 80 mg of potassium  Okra -  cup has 110 mg of potassium  Onions -  cup has 120 mg of potassium  Peach - 1 has 185 mg of potassium  Peaches (canned) -  cup has 120 mg of potassium  Pears (canned) -  cup has 120 mg of potassium  Peas, green (frozen) -  cup has 90 mg of potassium  Peppers (Green) -  cup has 130 mg of potassium  Peppers (Red) -  cup has 160 mg of potassium  Pineapple juice -  cup has 165 mg of potassium  Pineapple (fresh or canned) -  cup has 100  mg of potassium  Plums - 1 has 105 mg of potassium  Pudding, vanilla -  cup has 150 mg of potassium  Raspberries -  cup has 90 mg of potassium  Rhubarb -  cup has 115 mg of potassium  Rice, wild -  cup has 80 mg of potassium  Shrimp - 3 oz has 155 mg of potassium  Spinach (raw) - 1 cup has 170 mg of potassium  Strawberries -  cup has 125 mg of potassium  Summer squash -  cup has 175-200 mg of potassium  Swiss chard (raw) - 1 cup has 135 mg of potassium  Tangerines - 1 fruit has 140 mg of potassium  Tea, brewed - 6 oz has 65 mg of potassium  Turnips -  cup has 140 mg of potassium  Watermelon -  cup has 85 mg of potassium  Wine (Red, table) - 5 oz has 180 mg of potassium  Wine (White, table) - 5 oz 100 mg of potassium Low in potassium The following foods and beverages have less than 50 mg of potassium per serving.  Bread (white) - 1 slice has 30 mg of potassium  Carbonated beverages - 12 oz has less than 5 mg of potassium  Cheese - 1 oz has 20-30 mg of potassium  Cranberries -  cup has 45 mg of potassium  Cranberry juice cocktail -  cup has 20 mg of potassium  Fats and oils - 1 Tbsp has less than 5 mg of potassium  Hummus - 1 Tbsp has 32 mg of potassium  Nectar (papaya, mango, or pear) -  cup has 35 mg of potassium  Rice (white or brown) -  cup has 50 mg of potassium  Spaghetti or macaroni (cooked) -  cup has 30 mg of potassium  T[place]illa, flour or corn - 1 has 50 mg of potassium  Waffle - 1 four-inch waffle has 50 mg of potassium  Water chestnuts -  cup has 40 mg of potassium Summary  Potassium is a mineral found in many foods and drinks. It affects how the heart works, and helps keep fluids and minerals balanced in the body.  The amount of potassium you need each day depends on your age and any existing medical conditions you may have. Your health care provider or dietitian may recommend an amount of potassium that you should have each  day. This information is not intended to replace advice given to you by your health care provider. Make sure you discuss any questions you have with  your health care provider. Document Released: 12/03/2004 Document Revised: 07/16/2016 Document Reviewed: 07/16/2016 Elsevier Interactive Patient Education  2019 [employer].

## 2018-09-01 ENCOUNTER — Encounter: Payer: Self-pay | Admitting: Cardiovascular Disease

## 2018-09-02 NOTE — Telephone Encounter (Signed)
[facility] will hold his scripts until he is ready to pick them up.

## 2018-09-03 DIAGNOSIS — E785 Hyperlipidemia, unspecified: Secondary | ICD-10-CM | POA: Diagnosis not present

## 2018-09-03 DIAGNOSIS — R001 Bradycardia, unspecified: Secondary | ICD-10-CM | POA: Diagnosis not present

## 2018-09-03 DIAGNOSIS — I10 Essential (primary) hypertension: Secondary | ICD-10-CM | POA: Diagnosis not present

## 2018-09-03 LAB — BASIC METABOLIC PANEL
BUN/Creatinine Ratio: 16 (ref 10–24)
BUN: 14 mg/dL (ref 8–27)
CO2: 22 mmol/L (ref 20–29)
Calcium: 9 mg/dL (ref 8.6–10.2)
Chloride: 103 mmol/L (ref 96–106)
Creatinine, Ser: 0.87 mg/dL (ref 0.76–1.27)
GFR calc Af Amer: 102 mL/min/{1.73_m2} (ref >59)
GFR calc non Af Amer: 88 mL/min/{1.73_m2} (ref >59)
Glucose: 119 mg/dL — ABNORMAL HIGH (ref 65–99)
Potassium: 4.9 mmol/L (ref 3.5–5.2)
Sodium: 139 mmol/L (ref 134–144)

## 2018-09-06 ENCOUNTER — Other Ambulatory Visit: Payer: Self-pay | Admitting: Cardiovascular Disease

## 2018-09-06 ENCOUNTER — Ambulatory Visit (HOSPITAL_BASED_OUTPATIENT_CLINIC_OR_DEPARTMENT_OTHER): Payer: Medicare Other

## 2018-09-06 ENCOUNTER — Ambulatory Visit (HOSPITAL_COMMUNITY)
Admission: RE | Admit: 2018-09-06 | Discharge: 2018-09-06 | Disposition: A | Discharge status: Home / Self Care | Payer: Medicare Other | Source: Ambulatory Visit | Attending: Cardiovascular Disease | Admitting: Cardiovascular Disease

## 2018-09-06 ENCOUNTER — Other Ambulatory Visit: Payer: Self-pay

## 2018-09-06 DIAGNOSIS — E785 Hyperlipidemia, unspecified: Secondary | ICD-10-CM

## 2018-09-06 DIAGNOSIS — I6523 Occlusion and stenosis of bilateral carotid arteries: Secondary | ICD-10-CM

## 2018-09-06 DIAGNOSIS — R001 Bradycardia, unspecified: Secondary | ICD-10-CM

## 2018-09-06 DIAGNOSIS — I10 Essential (primary) hypertension: Secondary | ICD-10-CM | POA: Diagnosis not present

## 2018-09-13 ENCOUNTER — Telehealth: Payer: Self-pay | Admitting: Cardiovascular Disease

## 2018-09-13 DIAGNOSIS — M25562 Pain in left knee: Secondary | ICD-10-CM | POA: Diagnosis not present

## 2018-09-13 DIAGNOSIS — M1712 Unilateral primary osteoarthritis, left knee: Secondary | ICD-10-CM | POA: Diagnosis not present

## 2018-09-13 NOTE — Telephone Encounter (Signed)
New message   Patient states that he was advised that an appointment would be setup for him on 09/15/18. Please call.

## 2018-09-13 NOTE — Telephone Encounter (Signed)
Attempted to contact patient back.  LVM to call back, with call back number.

## 2018-09-13 NOTE — Telephone Encounter (Signed)
Unsure of the appointment for 05/13-  Patient does have appointment for 05/18 for virtual visit.

## 2018-09-14 NOTE — Telephone Encounter (Signed)
Spoke with patient via mychart messages, patient will keep appointment on 05/18

## 2018-09-20 ENCOUNTER — Ambulatory Visit: Payer: Medicare Other | Admitting: Cardiovascular Disease

## 2018-09-20 ENCOUNTER — Telehealth (INDEPENDENT_AMBULATORY_CARE_PROVIDER_SITE_OTHER): Payer: Medicare Other | Admitting: Cardiovascular Disease

## 2018-09-20 VITALS — BP 141/60 | HR 51 | Ht 70.0 in | Wt 187.0 lb

## 2018-09-20 DIAGNOSIS — I519 Heart disease, unspecified: Secondary | ICD-10-CM | POA: Diagnosis not present

## 2018-09-20 DIAGNOSIS — E785 Hyperlipidemia, unspecified: Secondary | ICD-10-CM

## 2018-09-20 DIAGNOSIS — I10 Essential (primary) hypertension: Secondary | ICD-10-CM | POA: Diagnosis not present

## 2018-09-20 DIAGNOSIS — I5189 Other ill-defined heart diseases: Secondary | ICD-10-CM

## 2018-09-20 DIAGNOSIS — R42 Dizziness and giddiness: Secondary | ICD-10-CM

## 2018-09-20 DIAGNOSIS — I2510 Atherosclerotic heart disease of native coronary artery without angina pectoris: Secondary | ICD-10-CM

## 2018-09-20 DIAGNOSIS — E875 Hyperkalemia: Secondary | ICD-10-CM

## 2018-09-20 DIAGNOSIS — F419 Anxiety disorder, unspecified: Secondary | ICD-10-CM

## 2018-09-20 MED ORDER — HYDROCHLOROTHIAZIDE 12.5 MG PO CAPS: 12.5000 mg | ORAL_CAPSULE | Freq: Every day | ORAL | 1 refills | Status: DC

## 2018-09-20 MED ORDER — EZETIMIBE 10 MG PO TABS: 10.0000 mg | ORAL_TABLET | Freq: Every day | ORAL | 1 refills | Status: DC

## 2018-09-20 MED ORDER — METOPROLOL SUCCINATE ER 25 MG PO TB24: 12.5000 mg | ORAL_TABLET | Freq: Every day | ORAL | 1 refills | Status: DC

## 2018-09-20 NOTE — Progress Notes (Signed)
Virtual Visit via Telephone Note   This visit type was conducted due to national recommendations for restrictions regarding the COVID-19 Pandemic (e.g. social distancing) in an effort to limit this patient's exposure and mitigate transmission in our community.  Due to his co-morbid illnesses, this patient is at least at moderate risk for complications without adequate follow up.  This format is felt to be most appropriate for this patient at this time.  The patient did not have access to video technology/had technical difficulties with video requiring transitioning to audio format only (telephone).  All issues noted in this document were discussed and addressed.  No physical exam could be performed with this format.  Please refer to the patient's chart for his  consent to telehealth for [facility].   Date:  09/20/2018   ID:  Dakota Gibbs, DOB November 09, 1950, MRN [id number]  Patient Location: Home Provider Location: Home  PCP:  Colon Branch, MD  Cardiologist:  Shelva Majestic, MD Electrophysiologist:  None   Evaluation Performed:  Follow-Up Visit  Chief Complaint: 1 month follow-up office visit with residual complaint of transient episodes of dizziness.  History of Present Illness:    Dakota Gibbs is a 68 y.o. male who suffered an inferior wall myocardial infarction on 05/10/1995 and underwent acute intervention to a totally occluded RCA. He had more distal disease in the posterolateral vessel which was treated medically. Several days later he underwent staged intervention to circumflex marginal vessel. His last cardiac catheterization was in 2001 which did not show restenosis and actually showed coronary artery disease regression.  He has been aggressively treated since his initial event.  When I saw him several  years ago he had noticed several episodes in the early morning while sleeping that he develops nocturnal palpitations. Upon further questioning he does snore and his  snoring is more significantly abnormal particularly after drinking alcohol. He wakes up one to 2 times per night.  He was started on Celexa for anxiety/depression which has helped.  He has a history of hyperlipidemia and has been tolerating Lipitor. He also has GERD, and hypertension.   Over the past year, he admits to a 25 pound weight loss. In contrast to his previous diet many years ago when he tried the [employer], his current diet is eating less along with few carbohydrates. His weight is reduced from 208-180 3 pounds. He is sleeping significantly improved. He is not aware of any snoring.  He continues to take amlodipine 5 mg, lisinopril 20, no grams, Toprol-XL 50 mg for hypertension and CAD.  He is on Zantac 150 mg for dyspepsia/GERD. He has more energy.  Lab work done by his primary physician had shown improvement in his hemoglobin A1c at 6.0, improved from 6.6.   I saw him in March 2018.  At that time he continues to do well an was working in the executive recruiting business.  He denied chest pain or shortness of breath.  At times he believes he may be having some short-term memory loss.    I saw him in the clinic on a DOD day on Sep 06, 2018.  Since I last saw him, he had continued to be active.  However, with the COVID-19 pandemic, he has not been going to the gym.  Over the past month, he has began to notice episodes of transient lightheadedness and dizziness which typically occur during physical exertion.  He denies any frank syncope.  He denies exertional chest pain or palpitations.  He has been noticing that his blood pressure has been in the 140 to 150 range.  He was on Toprol-XL 50 mg, amlodipine 5 mg, and lisinopril 20 mg.  He is on atorvastatin for hyperlipidemia.  He continues to be on Celexa.  He continues to be an executive recruiting but his work is significantly slowed down and have him most come to a complete halt during the [place] pandemic.    During that evaluation, his ECG  revealed sinus bradycardia at 43 bpm.  Laboratory also had shown elevated potassium at 5.6.  I decreased lisinopril from 20 mg down to 10 mg and titrated amlodipine to 10 mg.  I provided him information regarding potassium and certain foods and fruits.  An echo Doppler study as well as carotid duplex imaging were recommended.  Repeat laboratory several days later showed a potassium of 4.9.  Patient did realize that he was having a fair amount of grapefruit juice and a Seabreeze drink which also contributed to his potassium elevation.  His echo Doppler study showed an EF of60 to 65% with grade 1 diastolic dysfunction.  There was minimal increased RV systolic pressure at 95.2 mm.  There was mild right atrial dilatation.  His carotid study showed mild bilateral internal carotid plaque in the 1 to 39% range with greater than 50% ECA stenoses.  He had normal antegrade flow bilaterally in the vertebral arteries and his subclavian arteries were normal.  Since I last saw him, he has monitored his blood pressure and has average 40 readings.  He states his blood pressure has run over 40 samples averaging 142/59.  His pulse is increased slightly now in the 50s with an average of 53.  He had undergone repeat laboratory which had shown his LDL cholesterol had risen to 85.  He denies any episodes of chest pain or shortness of breath.  However, he still notes occasional episodes of transient dizziness . He presents for evaluation  The patient does not have symptoms concerning for COVID-19 infection (fever, chills, cough, or new shortness of breath).    Past Medical History:  Diagnosis Date   Allergy    enviornmental   Anxiety    Anxiety and depression 10/25/2012   Back pain    L4-L5 bulging disc, L5-S1 - bulging disc, pinched nerve in neck   CAD (coronary artery disease)      Dr Claiborne Billings, s/p stents----- sees cards yearly    Cancer [facility])    basal cell; carcinoma removed x3 and SCC   Depression    Diabetes  mellitus    pt denies   GERD (gastroesophageal reflux disease)    History of shingles 2009   Myocardial infarction ([place]) 1997   Past Surgical History:  Procedure Laterality Date   ANGIOPLASTY  1997   basel cell     CARDIAC CATHETERIZATION     10/04/1999   DOPPLER ECHOCARDIOGRAPHY  07/09/2006   EF 50 to 55 %, LA mildy dilated   HERNIA REPAIR  1951   RIGHT   KNEE ARTHROSCOPY  2001   R   NM MYOCAR PERF WALL MOTION  08/22/2011   Mets 13,low risk study   WRIST SURGERY  1984   right     Current Meds  Medication Sig   amLODipine (NORVASC) 5 MG tablet Take 2 tablets (10 mg total) by mouth daily.   aspirin EC 81 MG tablet Take 1 tablet (81 mg total) by mouth daily.   atorvastatin (LIPITOR) 40 MG tablet TAKE  1 TABLET EVERY DAY AT 6 PM   azelastine (ASTELIN) 0.1 % nasal spray Place 2 sprays into both nostrils 2 (two) times daily as needed for rhinitis or allergies. Use in each nostril as directed   cetirizine (ZYRTEC) 10 MG tablet Take 10 mg by mouth daily.   citalopram (CELEXA) 20 MG tablet Take 1.5 tablets (30 mg total) by mouth daily.   lisinopril (ZESTRIL) 20 MG tablet Take 0.5 tablets (10 mg total) by mouth daily.   Lutein 20 MG TABS Take 20 mg by mouth daily.   metoprolol succinate (TOPROL-XL) 25 MG 24 hr tablet Take 1 tablet (25 mg total) by mouth daily. Take with or immediately following a meal.   Multiple Vitamin (MULTIVITAMIN) tablet Take 1 tablet by mouth daily.     nitroGLYCERIN (NITROSTAT) 0.4 MG SL tablet Place 1 tablet (0.4 mg total) under the tongue every 5 (five) minutes as needed for chest pain.   pantoprazole (PROTONIX) 40 MG tablet Take 1 tablet (40 mg total) by mouth daily.   ranitidine (ZANTAC) 150 MG capsule Take 150 mg by mouth every evening.    sildenafil (VIAGRA) 100 MG tablet Take 100 mg by mouth daily as needed.     vitamin C (ASCORBIC ACID) 500 MG tablet Take 500 mg by mouth daily.     vitamin E 400 UNIT capsule Take 400 Units by mouth  daily.     Current Facility-Administered Medications for the 09/20/18 encounter (Telemedicine) with Troy Sine, MD  Medication   0.9 %  sodium chloride infusion     Allergies:   Patient has no known allergies.   Social History   Tobacco Use   Smoking status: Light Tobacco Smoker    Types: Cigars    Last attempt to quit: 03/19/1992    Years since quitting: 26.5   Smokeless tobacco: Never Used  Substance Use Topics   Alcohol use: Yes    Comment:  5 glasses wine / night   Drug use: No     Family Hx: The patient's family history includes COPD in his mother; Coronary artery disease in his father; Diabetes in his brother; Hypertension in his sister; Prostate cancer in his brother and father; Stroke in his mother. There is no history of Colon cancer, Esophageal cancer, Colon polyps, Rectal cancer, or Stomach cancer.  ROS:   Please see the history of present illness.    No fevers chills night sweats. No changes in vision Occasional recurrent dizzy episodes.  Not typically orthostatic related he has noted them at times doing yard work No chest pain PND orthopnea No wheezing Previous history of mild erectile dysfunction No changes in balance or headaches No numbness or tingling Mild depression/anxiety Purposeful weight loss Mild snoring All other systems reviewed and are negative.   Prior CV studies:   The following studies were reviewed today:  ECHO 09/06/2018 IMPRESSIONS  1. The left ventricle has normal systolic function with an ejection fraction of 60-65%. The cavity size was normal. Left ventricular diastolic Doppler parameters are consistent with impaired relaxation. No evidence of left ventricular regional wall  motion abnormalities.  2. The right ventricle has normal systolic function. The cavity was normal. There is no increase in right ventricular wall thickness. Right ventricular systolic pressure is normal with an estimated pressure of 32.6 mmHg.  3. Right  atrial size was mildly dilated.  Carotid Summary: 09/06/2018 Right Carotid: Velocities in the right ICA are consistent with a 1-39% stenosis.  The ECA appears >50% stenosed.  Left Carotid: Velocities in the left ICA are consistent with a 1-39% stenosis.               The ECA appears >50% stenosed.  Vertebrals:  Bilateral vertebral arteries demonstrate antegrade flow. Subclavians: Normal flow hemodynamics were seen in bilateral subclavian              arteries.  Labs/Other Tests and Data Reviewed:    EKG: No EKG was done today but the ECG from August 31, 2018 showed sinus bradycardia at 43 bpm  Recent Labs: 08/30/2018: ALT 19; Hemoglobin 13.1; Platelets 253; TSH 2.320 09/03/2018: BUN 14; Creatinine, Ser 0.87; Potassium 4.9; Sodium 139   Recent Lipid Panel Lab Results  Component Value Date/Time   CHOL 167 08/30/2018 08:31 AM   TRIG 117 08/30/2018 08:31 AM   HDL 59 08/30/2018 08:31 AM   CHOLHDL 2.8 08/30/2018 08:31 AM   CHOLHDL 2.6 06/27/2016 08:31 AM   LDLCALC 85 08/30/2018 08:31 AM   LDLDIRECT 83.0 06/16/2014 08:58 AM    Wt Readings from Last 3 Encounters:  09/20/18 187 lb (84.8 kg)  08/31/18 193 lb 6.4 oz (87.7 kg)  07/24/17 191 lb 12.8 oz (87 kg)     Objective:    Vital Signs:  BP (!) 141/60    Pulse (!) 51    Ht 5\' 10"  (1.778 m)    Wt 187 lb (84.8 kg)    BMI 26.83 kg/m    Since his last office visit he has taken his blood pressure on 40 instances and average blood pressure is 142/59 with a pulse of 53.  He also has checked his blood pressure for orthostatic changes, and he has not had any orthostatic drop in blood pressure going from supine to standing.  He denies any change in his physical appearance.  He has continued to purposefully lose weight He denies vision changes No hearing difficulties Breathing is normal and unlabored There is no audible wheezing He denies any palpation induced chest discomfort or abdominal discomfort When he takes his  pulse his rate is now in the 50s he denies any awareness of long pauses or irregularity He is unaware of any leg swelling. He denies any paresthesias He has normal cognition, mood and affect   ASSESSMENT & PLAN:    1. Essential hypertension: Mr. Iodice blood pressure has improved but based on recent guidelines is still elevated.  He continues to be on amlodipine 10 mg, reduced dose of lisinopril at 10 mg, Toprol-XL 25 mg, and has continued to have episodes of some dizziness and bradycardia.  I will reduce Toprol to 12.5 mg daily.  I will add HCTZ 12.5 mg daily.  He will continue to monitor his blood pressure. We discussed blood pressure goal at less than 130/80.  His echo Doppler study was reviewed which reveals normal systolic function but there is evidence for grade 1 diastolic dysfunction most likely contributed by hypertension. 2. Transient episodes of dizziness: When I last saw him, he was bradycardic with heart rates in the 40s and heart rate today is in the mid 50s.  I will reduce his Toprol to 12.5 mg daily to see if this improves some of his symptoms of dizziness.  I reviewed his echo Doppler data as well as his carotid duplex imaging studies.  He has normal bilateral anterograde flow to his vertebrals and he did not have any high-grade carotid stenoses.  He will contact our office in 2 weeks.  If he continues to experience these episodes despite being on a reduced dose of Toprol then I will order him to have a 2 to 4-week event monitor.  I discussed the possible evaluation. 3. CAD: He is asymptomatic without recurrent anginal symptomatology on his current medical regimen 4. Hyperlipidemia: He continues to be on atorvastatin 40 mg.  His most recent laboratory showed an LDL cholesterol 85.  I am adding Zetia 10 mg to take in addition to the atorvastatin 40 mg. 5. Hyperkalemia: He has consistently had upper normal potassium levels and on several instances have had elevated potassium levels  in the 5.6 range.  Recently, his lisinopril dose was reduced to 10 mg and he is now avoided the frequent grapefruit juice that he had been drinking in his Seabreeze alcoholic drink.  Most recent potassium was 4.9. 6. History of anxiety: On Celexa 7. History of GERD on Protonix  COVID-19 Education: The signs and symptoms of COVID-19 were discussed with the patient and how to seek care for testing (follow up with PCP or arrange E-visit).  The importance of social distancing was discussed today.  Time:   Today, I have spent 22 minutes with the patient with telehealth technology discussing the above problems.     Medication Adjustments/Labs and Tests Ordered: Current medicines are reviewed at length with the patient today.  Concerns regarding medicines are outlined above.   Tests Ordered: No orders of the defined types were placed in this encounter.   Medication Changes: No orders of the defined types were placed in this encounter.   Disposition:  Follow up: The patient will call after 2 weeks and if he is still having episodes of dizziness on his reduced beta-blocker, a 2 to 4-week event monitor will be placed.  I will see the patient in 2 months in the office and prior to that evaluation a CMP and lipid panel will be drawn  Signed, Shelva Majestic, MD  09/20/2018 8:44 AM    [place]

## 2018-09-20 NOTE — Patient Instructions (Addendum)
Medication Instructions:  Decrease Metoprolol to 1/2 tablet. Start Hydrochlorothiazide 12.5 mg daily. Start Zetia 10 mg daily.  If you need a refill on your cardiac medications before your next appointment, please call your pharmacy.   Lab work: CMET, LIPID- before follow up in 2 months.  If you have labs (blood work) drawn today and your tests are completely normal, you will receive your results only by: Marland Kitchen MyChart Message (if you have MyChart) OR . A paper copy in the mail If you have any lab test that is abnormal or we need to change your treatment, we will call you to review the results.  Follow-Up: At [facility], you and your health needs are our priority.  As part of our continuing mission to provide you with exceptional heart care, we have created designated Provider Care Teams.  These Care Teams include your primary Cardiologist (physician) and Advanced Practice Providers (APPs -  Physician Assistants and Nurse Practitioners) who all work together to provide you with the care you need, when you need it. You will need a follow up appointment in 2 months. You may see Dr.Kelly or one of the following Advanced Practice Providers on your designated Care Team: Almyra Deforest, [place] . Fabian Sharp, PA-C  Any Other Special Instructions Will Be Listed Below (If Applicable). Please let us know if the dizziness does not go away after the decrease in Metoprolol- we will need to order either a 2-4 week monitor.

## 2018-10-11 MED ORDER — AMLODIPINE BESYLATE 5 MG PO TABS: 10.0000 mg | ORAL_TABLET | Freq: Every day | ORAL | 3 refills | Status: DC

## 2018-10-12 MED ORDER — METOPROLOL SUCCINATE ER 25 MG PO TB24: 12.5000 mg | ORAL_TABLET | Freq: Every day | ORAL | 1 refills | Status: DC

## 2018-10-12 MED ORDER — HYDROCHLOROTHIAZIDE 12.5 MG PO CAPS: 12.5000 mg | ORAL_CAPSULE | Freq: Every day | ORAL | 1 refills | Status: DC

## 2018-10-12 MED ORDER — LISINOPRIL 20 MG PO TABS: 10.0000 mg | ORAL_TABLET | Freq: Every day | ORAL | 0 refills | Status: DC

## 2018-10-12 MED ORDER — EZETIMIBE 10 MG PO TABS: 10.0000 mg | ORAL_TABLET | Freq: Every day | ORAL | 1 refills | Status: DC

## 2018-10-12 NOTE — Addendum Note (Signed)
Addended by: Caprice Beaver T on: 10/12/2018 07:58 AM   Modules accepted: Orders

## 2018-10-20 DIAGNOSIS — H25013 Cortical age-related cataract, bilateral: Secondary | ICD-10-CM | POA: Diagnosis not present

## 2018-10-20 DIAGNOSIS — H2513 Age-related nuclear cataract, bilateral: Secondary | ICD-10-CM | POA: Diagnosis not present

## 2018-10-20 DIAGNOSIS — H25041 Posterior subcapsular polar age-related cataract, right eye: Secondary | ICD-10-CM | POA: Diagnosis not present

## 2018-10-20 DIAGNOSIS — E119 Type 2 diabetes mellitus without complications: Secondary | ICD-10-CM | POA: Diagnosis not present

## 2018-10-20 LAB — HM DIABETES EYE EXAM

## 2018-10-21 ENCOUNTER — Other Ambulatory Visit: Payer: Self-pay | Admitting: Internal Medicine

## 2018-10-22 ENCOUNTER — Encounter: Payer: Self-pay | Admitting: Internal Medicine

## 2018-10-22 ENCOUNTER — Telehealth: Payer: Self-pay | Admitting: *Deleted

## 2018-10-22 ENCOUNTER — Telehealth: Payer: Self-pay

## 2018-10-22 ENCOUNTER — Other Ambulatory Visit: Payer: Self-pay

## 2018-10-22 DIAGNOSIS — R42 Dizziness and giddiness: Secondary | ICD-10-CM

## 2018-10-22 DIAGNOSIS — I10 Essential (primary) hypertension: Secondary | ICD-10-CM

## 2018-10-22 NOTE — Telephone Encounter (Signed)
-----   Message from Jennefer Bravo sent at 10/22/2018  9:50 AM EDT ----- Regarding: FW: 2 week Zio monitor Hi Almyra Free, I was just confirming order because monitor not covered for dx of htn.  Per Dr. Ermalinda Memos note, patient to have 2-4 week event monitor dx lightheaded/dizzy & bradycardia.  Did you mean to order a 2 week event monitor.   (long term monitor is a holter monitor not a event monitor)  Please let me know.  Thanks, Darrick Penna ----- Message ----- From: Shirl Harris I Sent: 10/22/2018   8:50 AM EDT To: Jennefer Bravo, Katrina Theda [place] Subject: 2 week Zio monitor                               This patient needs a 2 week Zio monitor.

## 2018-10-22 NOTE — Telephone Encounter (Signed)
[employer] to ship 14 day cardiac event monitor to the patients home.  Instructions reviewed briefly as they are included in the monitor kit.

## 2018-10-24 ENCOUNTER — Encounter: Payer: Self-pay | Admitting: Internal Medicine

## 2018-10-28 ENCOUNTER — Encounter: Payer: Self-pay | Admitting: Internal Medicine

## 2018-10-28 ENCOUNTER — Other Ambulatory Visit: Payer: Self-pay

## 2018-10-28 ENCOUNTER — Ambulatory Visit (INDEPENDENT_AMBULATORY_CARE_PROVIDER_SITE_OTHER): Payer: Medicare Other | Admitting: Internal Medicine

## 2018-10-28 DIAGNOSIS — F419 Anxiety disorder, unspecified: Secondary | ICD-10-CM

## 2018-10-28 DIAGNOSIS — I10 Essential (primary) hypertension: Secondary | ICD-10-CM | POA: Diagnosis not present

## 2018-10-28 DIAGNOSIS — F329 Major depressive disorder, single episode, unspecified: Secondary | ICD-10-CM

## 2018-10-28 DIAGNOSIS — I6523 Occlusion and stenosis of bilateral carotid arteries: Secondary | ICD-10-CM

## 2018-10-28 DIAGNOSIS — E1142 Type 2 diabetes mellitus with diabetic polyneuropathy: Secondary | ICD-10-CM | POA: Diagnosis not present

## 2018-10-28 DIAGNOSIS — E785 Hyperlipidemia, unspecified: Secondary | ICD-10-CM

## 2018-10-28 MED ORDER — CITALOPRAM HYDROBROMIDE 20 MG PO TABS: 30.0000 mg | ORAL_TABLET | Freq: Every day | ORAL | 1 refills | Status: DC

## 2018-10-28 NOTE — Progress Notes (Signed)
Subjective:    Patient ID: Dakota Gibbs, male    DOB: 06/27/1950, 68 y.o.   MRN: [id number]  DOS:  10/28/2018 Type of visit - description: Attempted  to make this a video visit, due to technical difficulties from the patient side it was not possible  thus we proceeded with a Virtual Visit via Telephone    I connected with@ on 10/29/18 at  9:00 AM EDT by telephone and verified that I am speaking with the correct person using two identifiers.  THIS ENCOUNTER IS A VIRTUAL VISIT DUE TO COVID-19 - PATIENT WAS NOT SEEN IN THE OFFICE. PATIENT HAS CONSENTED TO VIRTUAL VISIT / TELEMEDICINE VISIT   Location of patient: home  Location of provider: office  I discussed the limitations, risks, security and privacy concerns of performing an evaluation and management service by telephone and the availability of in person appointments. I also discussed with the patient that there may be a patient responsible charge related to this service. The patient expressed understanding and agreed to proceed.   History of Present Illness:  ROV Since the last office visit he had episodes of dizziness, was seen by cardiology, medications adjusted, he feels better. High cholesterol: Zetia was added DM: Last A1c satisfactory, on diet control Needs a refill on citalopram  Review of Systems Denies fever chills No chest pain no difficulty breathing Had some mild peri-ankle edema, better with the addition of a diuretic No nausea, vomiting, diarrhea Recently had left knee pain, went to the orthopedic doctor got a local injection and feels better. At some point he snore, not an issue lately, energy level satisfactory  Past Medical History:  Diagnosis Date  . Allergy    enviornmental  . Anxiety   . Anxiety and depression 10/25/2012  . Back pain    L4-L5 bulging disc, L5-S1 - bulging disc, pinched nerve in neck  . CAD (coronary artery disease)      Dr Claiborne Billings, s/p stents----- sees cards yearly   . Cancer ([place])     basal cell; carcinoma removed x3 and SCC  . Depression   . Diabetes mellitus    pt denies  . GERD (gastroesophageal reflux disease)   . History of shingles 2009  . Myocardial infarction ([place]) 1997    Past Surgical History:  Procedure Laterality Date  . ANGIOPLASTY  1997  . basel cell    . CARDIAC CATHETERIZATION     10/04/1999  . DOPPLER ECHOCARDIOGRAPHY  07/09/2006   EF 50 to 55 %, LA mildy dilated  . HERNIA REPAIR  1951   RIGHT  . KNEE ARTHROSCOPY  2001   R  . NM MYOCAR PERF WALL MOTION  08/22/2011   Mets 13,low risk study  . WRIST SURGERY  1984   right    Social History   Socioeconomic History  . Marital status: Married    Spouse name: Not on file  . Number of children: 2  . Years of education: Not on file  . Highest education level: Not on file  Occupational History  . Occupation: Engineer, manufacturing   Social Needs  . Financial resource strain: Not on file  . Food insecurity    Worry: Not on file    Inability: Not on file  . Transportation needs    Medical: Not on file    Non-medical: Not on file  Tobacco Use  . Smoking status: Light Tobacco Smoker    Types: Cigars    Last attempt to quit: 03/19/1992  Years since quitting: 26.6  . Smokeless tobacco: Never Used  Substance and Sexual Activity  . Alcohol use: Yes    Comment:  5 glasses wine / night  . Drug use: No  . Sexual activity: Not on file  Lifestyle  . Physical activity    Days per week: Not on file    Minutes per session: Not on file  . Stress: Not on file  Relationships  . Social Herbalist on phone: Not on file    Gets together: Not on file    Attends religious service: Not on file    Active member of club or organization: Not on file    Attends meetings of clubs or organizations: Not on file    Relationship status: Not on file  . Intimate partner violence    Fear of current or ex partner: Not on file    Emotionally abused: Not on file    Physically abused: Not on file     Forced sexual activity: Not on file  Other Topics Concern  . Not on file  Social History Narrative   Lives w/ wife               Allergies as of 10/28/2018   No Known Allergies     Medication List       Accurate as of October 28, 2018 11:59 PM. If you have any questions, ask your nurse or doctor.        amLODipine 5 MG tablet Commonly known as: NORVASC Take 2 tablets (10 mg total) by mouth daily.   aspirin EC 81 MG tablet Take 1 tablet (81 mg total) by mouth daily.   atorvastatin 40 MG tablet Commonly known as: LIPITOR TAKE 1 TABLET EVERY DAY AT 6 PM   azelastine 0.1 % nasal spray Commonly known as: ASTELIN Place 2 sprays into both nostrils 2 (two) times daily as needed for rhinitis or allergies. Use in each nostril as directed   cetirizine 10 MG tablet Commonly known as: ZYRTEC Take 10 mg by mouth daily.   citalopram 20 MG tablet Commonly known as: CELEXA Take 1.5 tablets (30 mg total) by mouth daily.   ezetimibe 10 MG tablet Commonly known as: ZETIA Take 1 tablet (10 mg total) by mouth daily.   hydrochlorothiazide 12.5 MG capsule Commonly known as: MICROZIDE Take 1 capsule (12.5 mg total) by mouth daily.   lisinopril 20 MG tablet Commonly known as: ZESTRIL Take 0.5 tablets (10 mg total) by mouth daily.   Lutein 20 MG Tabs Take 20 mg by mouth daily.   metoprolol succinate 25 MG 24 hr tablet Commonly known as: TOPROL-XL Take 0.5 tablets (12.5 mg total) by mouth daily. Take with or immediately following a meal.   multivitamin tablet Take 1 tablet by mouth daily.   nitroGLYCERIN 0.4 MG SL tablet Commonly known as: NITROSTAT Place 1 tablet (0.4 mg total) under the tongue every 5 (five) minutes as needed for chest pain.   pantoprazole 40 MG tablet Commonly known as: PROTONIX Take 1 tablet (40 mg total) by mouth daily.   ranitidine 150 MG capsule Commonly known as: ZANTAC Take 150 mg by mouth every evening.   sildenafil 100 MG tablet Commonly  known as: VIAGRA Take 100 mg by mouth daily as needed.   vitamin C 500 MG tablet Commonly known as: ASCORBIC ACID Take 500 mg by mouth daily.   vitamin E 400 UNIT capsule Take 400 Units by mouth daily.  Objective:   Physical Exam There were no vitals taken for this visit. This is a virtual phone visit, he is alert oriented x3, no apparent distress    Assessment     Assessment   DM, + neuropathy (numbness) HTN CAD MI 1997 Anxiety and depression GERD Snoring, decreased after etoh decreased MSK: Back pain Skin cancer, BCC 3  Plan: DM: Diet controlled, last A1c very good. HTN: Patient had episode of dizziness, bradycardia.  Cardiology rearrange his medication: Decrease dose of metoprolol to 12.5, decrease lisinopril to 10 mg, added HCTZ and continue amlodipine 10 mg.  BP today 128/58 with a heart rate of 56.  Dizziness have significantly decreased.  Has blood work as scheduled at cardiology. High cholesterol: On Lipitor, recently zetia was added Anxiety and depression: Refill Lexapro, well controlled Snoring: Not an issue at this point,  see review of systems. Preventive care: Encouraged apparently flu shot this season and strong COVID-19 precautions which he is already doing. RTC 4 to 5 months     I discussed the assessment and treatment plan with the patient. The patient was provided an opportunity to ask questions and all were answered. The patient agreed with the plan and demonstrated an understanding of the instructions.   The patient was advised to call back or seek an in-person evaluation if the symptoms worsen or if the condition fails to improve as anticipated.  I provided 20  minutes of non-face-to-face time during this encounter.  Kathlene November, MD

## 2018-10-29 NOTE — Assessment & Plan Note (Signed)
DM: Diet controlled, last A1c very good. HTN: Patient had episode of dizziness, bradycardia.  Cardiology rearrange his medication: Decrease dose of metoprolol to 12.5, decrease lisinopril to 10 mg, added HCTZ and continue amlodipine 10 mg.  BP today 128/58 with a heart rate of 56.  Dizziness have significantly decreased.  Has blood work as scheduled at cardiology. High cholesterol: On Lipitor, recently zetia was added Anxiety and depression: Refill Lexapro, well controlled Snoring: Not an issue at this point,  see review of systems. Preventive care: Encouraged apparently flu shot this season and strong COVID-19 precautions which he is already doing. RTC 4 to 5 months

## 2018-11-02 ENCOUNTER — Ambulatory Visit (INDEPENDENT_AMBULATORY_CARE_PROVIDER_SITE_OTHER): Payer: Medicare Other

## 2018-11-02 DIAGNOSIS — R42 Dizziness and giddiness: Secondary | ICD-10-CM

## 2018-11-02 DIAGNOSIS — R001 Bradycardia, unspecified: Secondary | ICD-10-CM | POA: Diagnosis not present

## 2018-11-08 DIAGNOSIS — M25551 Pain in right hip: Secondary | ICD-10-CM | POA: Diagnosis not present

## 2018-11-08 DIAGNOSIS — F419 Anxiety disorder, unspecified: Secondary | ICD-10-CM | POA: Insufficient documentation

## 2018-11-08 DIAGNOSIS — M545 Low back pain: Secondary | ICD-10-CM | POA: Diagnosis not present

## 2018-11-08 DIAGNOSIS — M5136 Other intervertebral disc degeneration, lumbar region: Secondary | ICD-10-CM | POA: Diagnosis not present

## 2018-11-24 DIAGNOSIS — E785 Hyperlipidemia, unspecified: Secondary | ICD-10-CM | POA: Diagnosis not present

## 2018-11-24 DIAGNOSIS — I10 Essential (primary) hypertension: Secondary | ICD-10-CM | POA: Diagnosis not present

## 2018-11-24 DIAGNOSIS — M25551 Pain in right hip: Secondary | ICD-10-CM | POA: Diagnosis not present

## 2018-11-24 LAB — COMPREHENSIVE METABOLIC PANEL
ALT: 24 IU/L (ref 0–44)
AST: 20 IU/L (ref 0–40)
Albumin/Globulin Ratio: 1.9 (ref 1.2–2.2)
Albumin: 4.2 g/dL (ref 3.8–4.8)
Alkaline Phosphatase: 69 IU/L (ref 39–117)
BUN/Creatinine Ratio: 22 (ref 10–24)
BUN: 17 mg/dL (ref 8–27)
Bilirubin Total: 0.6 mg/dL (ref 0.0–1.2)
CO2: 24 mmol/L (ref 20–29)
Calcium: 9.2 mg/dL (ref 8.6–10.2)
Chloride: 99 mmol/L (ref 96–106)
Creatinine, Ser: 0.76 mg/dL (ref 0.76–1.27)
GFR calc Af Amer: 107 mL/min/{1.73_m2} (ref >59)
GFR calc non Af Amer: 92 mL/min/{1.73_m2} (ref >59)
Globulin, Total: 2.2 g/dL (ref 1.5–4.5)
Glucose: 111 mg/dL — ABNORMAL HIGH (ref 65–99)
Potassium: 4.9 mmol/L (ref 3.5–5.2)
Sodium: 139 mmol/L (ref 134–144)
Total Protein: 6.4 g/dL (ref 6.0–8.5)

## 2018-11-24 LAB — LIPID PANEL
Chol/HDL Ratio: 1.9 ratio (ref 0.0–5.0)
Cholesterol, Total: 150 mg/dL (ref 100–199)
HDL: 77 mg/dL (ref >39)
LDL Calculated: 55 mg/dL (ref 0–99)
Triglycerides: 91 mg/dL (ref 0–149)
VLDL Cholesterol Cal: 18 mg/dL (ref 5–40)

## 2018-12-08 ENCOUNTER — Ambulatory Visit (INDEPENDENT_AMBULATORY_CARE_PROVIDER_SITE_OTHER): Payer: Medicare Other | Admitting: Cardiovascular Disease

## 2018-12-08 ENCOUNTER — Encounter: Payer: Self-pay | Admitting: Cardiovascular Disease

## 2018-12-08 ENCOUNTER — Other Ambulatory Visit: Payer: Self-pay

## 2018-12-08 VITALS — BP 138/73 | HR 67 | Ht 70.0 in | Wt 192.4 lb

## 2018-12-08 DIAGNOSIS — I10 Essential (primary) hypertension: Secondary | ICD-10-CM

## 2018-12-08 DIAGNOSIS — E785 Hyperlipidemia, unspecified: Secondary | ICD-10-CM | POA: Diagnosis not present

## 2018-12-08 DIAGNOSIS — I6523 Occlusion and stenosis of bilateral carotid arteries: Secondary | ICD-10-CM | POA: Diagnosis not present

## 2018-12-08 DIAGNOSIS — F419 Anxiety disorder, unspecified: Secondary | ICD-10-CM

## 2018-12-08 DIAGNOSIS — R42 Dizziness and giddiness: Secondary | ICD-10-CM

## 2018-12-08 DIAGNOSIS — I2510 Atherosclerotic heart disease of native coronary artery without angina pectoris: Secondary | ICD-10-CM | POA: Diagnosis not present

## 2018-12-08 MED ORDER — METOPROLOL TARTRATE 25 MG PO TABS: ORAL_TABLET | ORAL | 1 refills | Status: DC

## 2018-12-08 NOTE — Progress Notes (Signed)
Patient ID: Dakota Gibbs, male   DOB: February 04, 1951, 68 y.o.   MRN: [id number]    PCP: Dr. Kathlene November  HPI: Dakota Gibbs is a 68 y.o. male who presents to the office today for a 2 month cardiology evaluation.  [name] suffered an inferior wall myocardial infarction on 05/10/1995 and underwent acute intervention to a totally occluded RCA. He had more distal disease in the posterolateral vessel which was treated medically. Several days later he underwent staged intervention to circumflex marginal vessel. His last cardiac catheterization was in 2001 which did not show restenosis and actually showed coronary artery disease regression.  He has been aggressively treated since his initial event.  When I saw him several  years ago he had noticed several episodes in the early morning while sleeping that he develops nocturnal palpitations. Upon further questioning he does snore and his snoring is more significantly abnormal particularly after drinking alcohol. He wakes up one to 2 times per night.  He was started on Celexa for anxiety/depression which has helped.  He has a history of hyperlipidemia and has been tolerating Lipitor. He also has GERD, and hypertension.   Over the past year, he admits to a 25 pound weight loss. In contrast to his previous diet many years ago when he tried the [employer], his current diet is eating less along with few carbohydrates. His weight is reduced from 208-180 3 pounds. He is sleeping significantly improved. He is not aware of any snoring.  He continues to take amlodipine 5 mg, lisinopril 20, no grams, Toprol-XL 50 mg for hypertension and CAD.  He is on Zantac 150 mg for dyspepsia/GERD. He has more energy.  Lab work done by his primary physician had shown improvement in his hemoglobin A1c at 6.0, improved from 6.6.   I last saw him in March 2018.  At that time he continues to do well an was working in the executive recruiting business.  He denied chest pain  or shortness of breath.  At times he believes he may be having some short-term memory loss.    I saw him in the clinic on a DOD on Sep 06, 2018.  However, with the COVID-19 pandemic, he had not been going to the gym.  Over the past month, he has began to notice episodes of transient lightheadedness and dizziness which typically occur during physical exertion.  He denies any frank syncope.  He denies exertional chest pain or palpitations.  He has been noticing that his blood pressure has been in the 140 to 150 range.  He has continued to be on Toprol-XL 50 mg, amlodipine 5 mg, and lisinopril 20 mg.  He is on atorvastatin for hyperlipidemia.  He continues to be on Celexa.  He continues to be an executive recruiting but his work is significantly slowed down and have him most come to a complete halt during the [place] pandemic.    During that evaluation, his ECG revealed sinus bradycardia at 43 bpm.  Laboratory also had shown elevated potassium at 5.6.  I decreased lisinopril from 20 mg down to 10 mg and titrated amlodipine to 10 mg.  I provided him information regarding potassium and certain foods and fruits.  An echo Doppler study as well as carotid duplex imaging were recommended.  Repeat laboratory several days later showed a potassium of 4.9.  Patient did realize that he was having a fair amount of grapefruit juice and a Seabreeze drink which also contributed to his potassium  elevation.  His echo Doppler study showed an EF of 60 to 65% with grade 1 diastolic dysfunction.  There was minimal increased RV systolic pressure at 70.0 mm.  There was mild right atrial dilatation.  His carotid study showed mild bilateral internal carotid plaque in the 1 to 39% range with greater than 50% ECA stenoses.  He had normal antegrade flow bilaterally in the vertebral arteries and his subclavian arteries were normal.  Since I last saw him, he has monitored his blood pressure and has average 40 readings.  He states his blood  pressure has run over 40 samples averaging 142/59.  His pulse is increased slightly now in the 50s with an average of 53.  He had undergone repeat laboratory which had shown his LDL cholesterol had risen to 85.  He denies any episodes of chest pain or shortness of breath.  However, he still notes occasional episodes of transient dizziness . He presents for evaluation  I last evaluated him in a telemedicine visit on Sep 20, 2018.  At that time his blood pressure had improved.  He was on amlodipine 10 mg, reduced dose of  lisinopril at 10 mg, and Toprol-XL 25 mg.  He continued to have some episodes of dizziness and bradycardia and during that evaluation recommended reduction of Toprol-XL to 12.5 mg daily.  I reviewed his echo Doppler data as well as his carotid duplex imaging studies.  He had normal bilateral antegrade flow to his vertebrals and he did not have any high-grade carotid stenoses.  With his LDL cholesterol of 85 on atorvastatin 40 mg I added Zetia 10 mg.  Repeat potassium improved to 4.9 with discontinuance of his frequent grapefruit juice.  I recommended that he undergo an event monitor for further evaluation of potential arrhythmia.  He was monitored from June 30 through November 15, 2018.  The predominant rhythm was sinus rhythm with the fastest episode of sinus tachycardia at 110 bpm at 8:28 PM on July 4.  His slowest episode was sinus bradycardia at 46 bpm while sleeping at 3:50 AM on July 8.  There were occasional PVCs representing 1% of his abnormal rhythm.  There was 1 episode of 4 beats of multifocal ventricular tachycardia on July 3.  Otherwise PVCs were isolated.  There were no episodes of atrial fibrillation.  There were no pauses.   Presyncope has improved.  However he does note that he still feels episodes of some lightheadedness.  Today he planted for plants in the heat.  He also cuts rubs.  He denied any definitive orthostatic symptoms.  He walked a mile today without chest pain or  dizziness.  He presents for reevaluation.   Past Medical History:  Diagnosis Date   Allergy    enviornmental   Anxiety    Anxiety and depression 10/25/2012   Back pain    L4-L5 bulging disc, L5-S1 - bulging disc, pinched nerve in neck   CAD (coronary artery disease)      Dr Claiborne Billings, s/p stents----- sees cards yearly    Cancer [facility])    basal cell; carcinoma removed x3 and SCC   Depression    Diabetes mellitus    pt denies   GERD (gastroesophageal reflux disease)    History of shingles 2009   Myocardial infarction ([place]) 1997    Past Surgical History:  Procedure Laterality Date   ANGIOPLASTY  1997   basel cell     CARDIAC CATHETERIZATION     10/04/1999   DOPPLER ECHOCARDIOGRAPHY  07/09/2006   EF 50 to 55 %, LA mildy dilated   HERNIA REPAIR  1951   RIGHT   KNEE ARTHROSCOPY  2001   R   NM MYOCAR PERF WALL MOTION  08/22/2011   Mets 13,low risk study   WRIST SURGERY  1984   right    No Known Allergies  Current Outpatient Medications  Medication Sig Dispense Refill   amLODipine (NORVASC) 5 MG tablet Take 2 tablets (10 mg total) by mouth daily. 90 tablet 3   aspirin EC 81 MG tablet Take 1 tablet (81 mg total) by mouth daily. 90 tablet 3   atorvastatin (LIPITOR) 40 MG tablet TAKE 1 TABLET EVERY DAY AT 6 PM 90 tablet 3   azelastine (ASTELIN) 0.1 % nasal spray Place 2 sprays into both nostrils 2 (two) times daily as needed for rhinitis or allergies. Use in each nostril as directed 90 mL 3   cetirizine (ZYRTEC) 10 MG tablet Take 10 mg by mouth daily.     citalopram (CELEXA) 20 MG tablet Take by mouth daily. PT TAKES 30 MG DAILY     ezetimibe (ZETIA) 10 MG tablet Take 1 tablet (10 mg total) by mouth daily. 90 tablet 1   famotidine (PEPCID) 20 MG tablet Take 20 mg by mouth daily.     hydrochlorothiazide (MICROZIDE) 12.5 MG capsule Take 1 capsule (12.5 mg total) by mouth daily. 90 capsule 1   lisinopril (ZESTRIL) 20 MG tablet Take 0.5 tablets (10 mg total)  by mouth daily. 90 tablet 0   Lutein 20 MG TABS Take 20 mg by mouth daily.     metoprolol succinate (TOPROL-XL) 25 MG 24 hr tablet Take 0.5 tablets (12.5 mg total) by mouth daily. Take with or immediately following a meal. 90 tablet 1   Multiple Vitamin (MULTIVITAMIN) tablet Take 1 tablet by mouth daily.       nitroGLYCERIN (NITROSTAT) 0.4 MG SL tablet Place 1 tablet (0.4 mg total) under the tongue every 5 (five) minutes as needed for chest pain. 25 tablet 1   sildenafil (VIAGRA) 100 MG tablet Take 100 mg by mouth daily as needed.       vitamin C (ASCORBIC ACID) 500 MG tablet Take 500 mg by mouth daily.       vitamin E 400 UNIT capsule Take 400 Units by mouth daily.       metoprolol tartrate (LOPRESSOR) 25 MG tablet Take 1/2 tablet to 1 tablet as needed for palpitations. 90 tablet 1   No current facility-administered medications for this visit.     Social History   Socioeconomic History   Marital status: Married    Spouse name: Not on file   Number of children: 2   Years of education: Not on file   Highest education level: Not on file  Occupational History   Occupation: Engineer, manufacturing   Social Needs   Financial resource strain: Not on file   Food insecurity    Worry: Not on file    Inability: Not on file   Transportation needs    Medical: Not on file    Non-medical: Not on file  Tobacco Use   Smoking status: Light Tobacco Smoker    Types: Cigars    Last attempt to quit: 03/19/1992    Years since quitting: 26.7   Smokeless tobacco: Never Used  Substance and Sexual Activity   Alcohol use: Yes    Comment:  5 glasses wine / night   Drug use: No  Sexual activity: Not on file  Lifestyle   Physical activity    Days per week: Not on file    Minutes per session: Not on file   Stress: Not on file  Relationships   Social connections    Talks on phone: Not on file    Gets together: Not on file    Attends religious service: Not on file    Active  member of club or organization: Not on file    Attends meetings of clubs or organizations: Not on file    Relationship status: Not on file   Intimate partner violence    Fear of current or ex partner: Not on file    Emotionally abused: Not on file    Physically abused: Not on file    Forced sexual activity: Not on file  Other Topics Concern   Not on file  Social History Narrative   Lives w/ wife            Social history is notable in that he is married and has 2 children. He now has a dog and walks the dog 1 mile 2 times a day. He is no longer using his treadmill. There is no tobacco use. He does drink alcohol.  Family History  Problem Relation Age of Onset   Stroke Mother        TIA   COPD Mother    Hypertension Sister    Prostate cancer Father        Father and brother    Coronary artery disease Father        GM   Prostate cancer Brother    Diabetes Brother        B, GM, other fami;ly members    Colon cancer Neg Hx    Esophageal cancer Neg Hx    Colon polyps Neg Hx    Rectal cancer Neg Hx    Stomach cancer Neg Hx     ROS General: Negative; No fevers, chills, or night sweats;  Positive for previous purposeful weight loss of 25 pounds. HEENT: Negative; No changes in vision or hearing, sinus congestion, difficulty swallowing Pulmonary: Negative; No cough, wheezing, shortness of breath, hemoptysis Cardiovascular: See HPI GI: Negative; No nausea, vomiting, diarrhea, or abdominal pain GU: Mild erectile dysfunction; No dysuria, hematuria, or difficulty voiding Musculoskeletal: Negative; no myalgias, joint pain, or weakness Hematologic/Oncology: Negative; no easy bruising, bleeding Endocrine: Negative; no heat/cold intolerance; no diabetes Neuro: Mild short-term memory loss; no changes in balance, headaches Skin: Negative; No rashes or skin lesions Psychiatric: Mild depression/anxiety Sleep: Positive for mild snoring which has significantly improved with  his weight loss. no daytime sleepiness, hypersomnolence, bruxism, restless legs, hypnogognic hallucinations, no cataplexy Other comprehensive 14 point system review is negative.   PE BP 138/73    Pulse 67    Ht 5\' 10"  (1.778 m)    Wt 192 lb 6.4 oz (87.3 kg)    SpO2 96%    BMI 27.61 kg/m    Repeat blood pressure by me was 140/64 supine; 146/66 sitting; 138/58 standing  Wt Readings from Last 3 Encounters:  12/08/18 192 lb 6.4 oz (87.3 kg)  09/20/18 187 lb (84.8 kg)  08/31/18 193 lb 6.4 oz (87.7 kg)   General: Alert, oriented, no distress.  Skin: normal turgor, no rashes, warm and dry HEENT: Normocephalic, atraumatic. Pupils equal round and reactive to light; sclera anicteric; extraocular muscles intact;  Nose without nasal septal hypertrophy Mouth/Parynx benign; Mallinpatti scale 3 Neck: No  JVD, no carotid bruits; normal carotid upstroke Lungs: clear to ausculatation and percussion; no wheezing or rales Chest wall: without tenderness to palpitation Heart: PMI not displaced, RRR, s1 s2 normal, 1/6 systolic murmur, no diastolic murmur, no rubs, gallops, thrills, or heaves Abdomen: soft, nontender; no hepatosplenomehaly, BS+; abdominal aorta nontender and not dilated by palpation. Back: no CVA tenderness Pulses 2+ Musculoskeletal: full range of motion, normal strength, no joint deformities Extremities: no clubbing cyanosis or edema, Homan's sign negative  Neurologic: grossly nonfocal; Cranial nerves grossly wnl Psychologic: Normal mood and affect  ECG (independently read by me): Normal sinus rhythm at 61 bpm.  Sep 06, 2018 ECG (independently read by me): Marked sinus bradycardia at 43 bpm.  No ST segment changes.  PR interval 148 ms, QTc interval 419 ms.  March 2019 ECG (independently read by me): sinus bradycardia 51 bpm.  No ectopy.  Normal intervals.  March 2018 ECG (independently read by me): Sinus bradycardia 52 bpm.  Small nondiagnostic inferior Q waves.  Normal  intervals  November 2016 ECG (independently read by me):  Sinus bradycardia at 49 bpm.  Small inferior Q waves with preserved R waves.  February 2016 ECG (independently read by me): Sinus bradycardia 46 bpm.  No ectopy.  February 2015 ECG (independently read by me): Normal sinus rhythm at 62 beats per minute. Small nondiagnostic inferior Q waves. Observed R waves.  LABS:  BMP Latest Ref Rng & Units 11/24/2018 09/03/2018 08/30/2018  Glucose 65 - 99 mg/dL 111(H) 119(H) 120(H)  BUN 8 - 27 mg/dL 17 14 16   Creatinine 0.76 - 1.27 mg/dL 0.76 0.87 0.85  BUN/Creat Ratio 10 - 24 22 16 19   Sodium 134 - 144 mmol/L 139 139 140  Potassium 3.5 - 5.2 mmol/L 4.9 4.9 5.6(H)  Chloride 96 - 106 mmol/L 99 103 105  CO2 20 - 29 mmol/L 24 22 22   Calcium 8.6 - 10.2 mg/dL 9.2 9.0 9.2      Component Value Date/Time   PROT 6.4 11/24/2018 0843   ALBUMIN 4.2 11/24/2018 0843   AST 20 11/24/2018 0843   ALT 24 11/24/2018 0843   ALKPHOS 69 11/24/2018 0843   BILITOT 0.6 11/24/2018 0843    CBC Latest Ref Rng & Units 08/30/2018 02/05/2016 05/29/2014  WBC 3.4 - 10.8 x10E3/uL 6.3 7.4 7.1  Hemoglobin 13.0 - 17.7 g/dL 13.1 13.5 14.3  Hematocrit 37.5 - 51.0 % 39.4 39.4 41.7  Platelets 150 - 450 x10E3/uL 253 236.0 252   Lab Results  Component Value Date   MCV 98 (H) 08/30/2018   MCV 93.0 02/05/2016   MCV 91.2 05/29/2014   Lab Results  Component Value Date   TSH 2.320 08/30/2018   Lab Results  Component Value Date   HGBA1C 5.7 (H) 08/30/2018    BNP No results found for: PROBNP  Lipid Panel     Component Value Date/Time   CHOL 150 11/24/2018 0843   TRIG 91 11/24/2018 0843   HDL 77 11/24/2018 0843   CHOLHDL 1.9 11/24/2018 0843   CHOLHDL 2.6 06/27/2016 0831   VLDL 36 (H) 06/27/2016 0831   LDLCALC 55 11/24/2018 0843    RADIOLOGY: No results found.  IMPRESSION:  1. Essential hypertension   2. Dizziness   3. CAD in native artery   4. Hyperlipidemia with target LDL less than 70   5. Anxiety      ASSESSMENT AND PLAN:  Mr. Nehring is a 68 year old Caucasian male who is 23 years status post his inferior  wall myocardial infarction at which time he underwent acute intervention to a totally occluded RCA and staged intervention to circumflex marginal vessel in January 1997. His last catheterization in 2001 showed plaque regression on his aggressive medical regimen.  He has been aggressively treated and has remained stable without recurrent anginal symptoms.  His ECGs have shown sinus bradycardia and does not reveal evidence for his prior infarction.  In the office today I again reviewed his echo Doppler data, carotid duplex imaging and spent considerable time reviewing his 14-day Holter monitor.  He has experienced episodes where he gets vague sensation and notices today once standing up after planning for plants in the heat.  He was unaware of any associated palpitations.  He was able to walk a mile today without discomfort or sense of palpitations.  His Holter monitor shows predominant sinus rhythm with occasional isolated PVCs although there was one 4 beat episode of multifocal ventricular tachycardia.  His beta-blocker dose had been reduced because of bradycardia with heart rates in the 40s.  His heart rate today is improved now at 61.  When I last evaluated him in a telemedicine visit I did suggest reducing Toprol to 12.5 mg daily.  He is uncertain if he is still taking the 25 mg or is taking the 12.5 mg.  We will try taking the 12.5 mg to see if his transient lightheadedness improve.  His echo Doppler study has demonstrated normal systolic function with EF of 60 to 65%.  He will monitor his blood pressures at home as well as his heart rate.  He is tolerating the addition of Zetia to atorvastatin.  Repeat laboratory will need to be checked.  I will see him in the office in 2 to 3 months for reevaluation or sooner if problems arise.  Time spent: 25 minutes Troy Sine, MD, [facility]   12/10/2018 7:38 PM

## 2018-12-08 NOTE — Patient Instructions (Signed)
Medication Instructions:  Reduce Toprol XL to 12.5 mg daily, unless seeing more palpitations. Take Metoprolol Tartrate 25 mg take 1/2-1 tablet as needed for palpitations. If you need a refill on your cardiac medications before your next appointment, please call your pharmacy.   Follow-Up: At [facility], you and your health needs are our priority.  As part of our continuing mission to provide you with exceptional heart care, we have created designated Provider Care Teams.  These Care Teams include your primary Cardiologist (physician) and Advanced Practice Providers (APPs -  Physician Assistants and Nurse Practitioners) who all work together to provide you with the care you need, when you need it. You will need a follow up appointment in 2 months.  Please call our office 2 months in advance to schedule this appointment.  You may see Dr.Kelly or one of the following Advanced Practice Providers on your designated Care Team: Almyra Deforest, [place] . Fabian Sharp, PA-C

## 2018-12-09 ENCOUNTER — Other Ambulatory Visit (INDEPENDENT_AMBULATORY_CARE_PROVIDER_SITE_OTHER): Payer: Medicare Other

## 2018-12-09 DIAGNOSIS — R42 Dizziness and giddiness: Secondary | ICD-10-CM

## 2018-12-10 ENCOUNTER — Encounter: Payer: Self-pay | Admitting: Cardiovascular Disease

## 2018-12-13 ENCOUNTER — Other Ambulatory Visit: Payer: Self-pay

## 2018-12-13 MED ORDER — METOPROLOL TARTRATE 25 MG PO TABS: ORAL_TABLET | ORAL | 1 refills | Status: DC

## 2018-12-14 ENCOUNTER — Other Ambulatory Visit: Payer: Self-pay | Admitting: Cardiovascular Disease

## 2018-12-15 ENCOUNTER — Other Ambulatory Visit: Payer: Self-pay

## 2018-12-15 MED ORDER — METOPROLOL TARTRATE 25 MG PO TABS: 12.5000 mg | ORAL_TABLET | Freq: Every day | ORAL | 1 refills | Status: DC | PRN

## 2018-12-15 NOTE — Telephone Encounter (Signed)
Rx(s) sent to pharmacy electronically.  

## 2018-12-20 DIAGNOSIS — M25551 Pain in right hip: Secondary | ICD-10-CM | POA: Diagnosis not present

## 2018-12-20 DIAGNOSIS — M545 Low back pain: Secondary | ICD-10-CM | POA: Diagnosis not present

## 2019-01-06 DIAGNOSIS — M5136 Other intervertebral disc degeneration, lumbar region: Secondary | ICD-10-CM | POA: Diagnosis not present

## 2019-01-24 ENCOUNTER — Encounter: Payer: Self-pay | Admitting: Cardiology

## 2019-01-24 ENCOUNTER — Encounter: Payer: Self-pay | Admitting: General Practice

## 2019-01-24 ENCOUNTER — Ambulatory Visit (INDEPENDENT_AMBULATORY_CARE_PROVIDER_SITE_OTHER): Payer: Medicare Other | Admitting: General Practice

## 2019-01-24 ENCOUNTER — Other Ambulatory Visit: Payer: Self-pay

## 2019-01-24 VITALS — BP 120/56 | HR 56 | Ht 70.0 in | Wt 197.0 lb

## 2019-01-24 DIAGNOSIS — I6523 Occlusion and stenosis of bilateral carotid arteries: Secondary | ICD-10-CM

## 2019-01-24 DIAGNOSIS — E785 Hyperlipidemia, unspecified: Secondary | ICD-10-CM | POA: Diagnosis not present

## 2019-01-24 DIAGNOSIS — R42 Dizziness and giddiness: Secondary | ICD-10-CM

## 2019-01-24 DIAGNOSIS — I10 Essential (primary) hypertension: Secondary | ICD-10-CM

## 2019-01-24 DIAGNOSIS — I2510 Atherosclerotic heart disease of native coronary artery without angina pectoris: Secondary | ICD-10-CM

## 2019-01-24 DIAGNOSIS — R0602 Shortness of breath: Secondary | ICD-10-CM

## 2019-01-24 NOTE — Patient Instructions (Addendum)
Medication Instructions:  Your physician recommends that you continue on your current medications as directed. Please refer to the Current Medication list given to you today. If you need a refill on your cardiac medications before your next appointment, please call your pharmacy.   Lab work: None  If you have labs (blood work) drawn today and your tests are completely normal, you will receive your results only by: Marland Kitchen MyChart Message (if you have MyChart) OR . A paper copy in the mail If you have any lab test that is abnormal or we need to change your treatment, we will call you to review the results.  Testing/Procedures: Your physician has requested that you have a lexiscan myoview. For further information please visit [email or web address]. Please follow instruction sheet, as given. HOLD METOPROLOL THE MORNING OF TEST  Follow-Up: At [facility], you and your health needs are our priority.  As part of our continuing mission to provide you with exceptional heart care, we have created designated Provider Care Teams.  These Care Teams include your primary Cardiologist (physician) and Advanced Practice Providers (APPs -  Physician Assistants and Nurse Practitioners) who all work together to provide you with the care you need, when you need it.  You will need a follow up appointment AFTER STRESS TEST. You may see Shelva Majestic, MD or Coletta Memos, NP.  Any Other Special Instructions Will Be Listed Below (If Applicable). CHECK YOUR BLOOD PRESSURE AND KEEP A LOG OF THE READINGS. BRING READING TO YOUR NEXT APPOINTMENT.

## 2019-01-24 NOTE — Progress Notes (Signed)
Cardiology Clinic Note   Patient Name: Dakota Gibbs Date of Encounter: 01/24/2019  Primary Care Provider:  Colon Branch, MD Primary Cardiologist:  Dakota Majestic, MD  Patient Profile    Dakota Gibbs 68 year old male presents today for follow-up of his presyncope.  Past Medical History    Past Medical History:  Diagnosis Date   Allergy    enviornmental   Anxiety    Anxiety and depression 10/25/2012   Back pain    L4-L5 bulging disc, L5-S1 - bulging disc, pinched nerve in neck   CAD (coronary artery disease)      Dakota Gibbs, s/p stents----- sees cards yearly    Cancer [facility])    basal cell; carcinoma removed x3 and SCC   Depression    Diabetes mellitus    pt denies   GERD (gastroesophageal reflux disease)    History of shingles 2009   Myocardial infarction ([place]) 1997   Past Surgical History:  Procedure Laterality Date   ANGIOPLASTY  1997   basel cell     CARDIAC CATHETERIZATION     10/04/1999   DOPPLER ECHOCARDIOGRAPHY  07/09/2006   EF 50 to 55 %, LA mildy dilated   HERNIA REPAIR  1951   RIGHT   KNEE ARTHROSCOPY  2001   R   NM MYOCAR PERF WALL MOTION  08/22/2011   Mets 13,low risk study   WRIST SURGERY  1984   right    Allergies  No Known Allergies  History of Present Illness    Dakota Gibbs was last seen by Dakota. Claiborne Gibbs on 12/08/2018.  During that time he was having fewer episodes of presyncope and able to perform yard work and plant his flower garden.  He was unsure during his last visit if he was still taking 25 mg of metoprolol or if he was taking 12.5 mg.  Metoprolol 12.5 was prescribed and he follows up today.  In January 1997 he underwent cardiac catheterization for an occluded RCA.  The procedure was staged for a circumflex marginal vessel.  His last cardiac catheterization in 2001 showed plaque regression on his medical management therapy.  His prior EKGs also have not shown any evidence of prior infarction.  His echocardiogram  on 09/06/2018 showed an [name] of 60 to 65%.  Impaired diastolic relaxation.  Right ventricular systolic pressure of 32. 06 mmHg.  Right atria mildly dilated.  PMH also includes sinus bradycardia, GERD, diabetes mellitus, DJD, anxiety and depression, hyperlipidemia, and palpitations.  He presents to the clinic today and states he has been taking 12.5 metoprolol and does not feel tired or lethargic unless he is trying to be physically active.  He states he continues to have increased shortness of breath with increased physical activity, diaphoresis with increased physical activity, and fatigue after short bouts of physical exertion.  He also states he has mild substernal chest discomfort with exertion that subsides when he discontinues activity.  He has not taken any nitroglycerin to help relieve his chest pressure.  He denies chest pain, palpitations, melena, hematuria, hemoptysis, diaphoresis, weakness, presyncope, syncope, orthopnea, and PND.   Home Medications    Prior to Admission medications   Medication Sig Start Date End Date Taking? Authorizing Provider  amLODipine (NORVASC) 5 MG tablet Take 2 tablets (10 mg total) by mouth daily. 10/11/18   Dakota Sine, MD  aspirin EC 81 MG tablet Take 1 tablet (81 mg total) by mouth daily. 06/27/14   Dakota Sine, MD  atorvastatin (  LIPITOR) 40 MG tablet TAKE 1 TABLET EVERY DAY AT 6 PM 12/14/18   Dakota Sine, MD  azelastine (ASTELIN) 0.1 % nasal spray Place 2 sprays into both nostrils 2 (two) times daily as needed for rhinitis or allergies. Use in each nostril as directed 07/28/17   Dakota Branch, MD  cetirizine (ZYRTEC) 10 MG tablet Take 10 mg by mouth daily.    [provider]  citalopram (CELEXA) 20 MG tablet Take by mouth daily. PT TAKES 30 MG DAILY    [provider]  ezetimibe (ZETIA) 10 MG tablet Take 1 tablet (10 mg total) by mouth daily. 10/12/18 01/10/19  Dakota Sine, MD  famotidine (PEPCID) 20 MG tablet Take 20 mg by mouth  daily.    [provider]  hydrochlorothiazide (MICROZIDE) 12.5 MG capsule Take 1 capsule (12.5 mg total) by mouth daily. 10/12/18 01/10/19  Dakota Sine, MD  lisinopril (ZESTRIL) 20 MG tablet Take 0.5 tablets (10 mg total) by mouth daily. 10/12/18   Dakota Sine, MD  Lutein 20 MG TABS Take 20 mg by mouth daily.    [provider]  metoprolol succinate (TOPROL-XL) 25 MG 24 hr tablet Take 0.5 tablets (12.5 mg total) by mouth daily. Take with or immediately following a meal. 10/12/18   Dakota Sine, MD  metoprolol tartrate (LOPRESSOR) 25 MG tablet Take 0.5-1 tablets (12.5-25 mg total) by mouth daily as needed (heart palpitations). 12/15/18   Dakota Sine, MD  Multiple Vitamin (MULTIVITAMIN) tablet Take 1 tablet by mouth daily.      [provider]  nitroGLYCERIN (NITROSTAT) 0.4 MG SL tablet Place 1 tablet (0.4 mg total) under the tongue every 5 (five) minutes as needed for chest pain. 07/24/16   Dakota Sine, MD  sildenafil (VIAGRA) 100 MG tablet Take 100 mg by mouth daily as needed.      [provider]  vitamin C (ASCORBIC ACID) 500 MG tablet Take 500 mg by mouth daily.      [provider]  vitamin E 400 UNIT capsule Take 400 Units by mouth daily.      [provider]    Family History    Family History  Problem Relation Age of Onset   Stroke Mother        TIA   COPD Mother    Hypertension Sister    Prostate cancer Father        Father and brother    Coronary artery disease Father        GM   Prostate cancer Brother    Diabetes Brother        B, GM, other fami;ly members    Dakota cancer Neg Hx    Esophageal cancer Neg Hx    Dakota polyps Neg Hx    Rectal cancer Neg Hx    Stomach cancer Neg Hx    He indicated that his mother is deceased. He indicated that his father is deceased. He indicated that both of his sisters are alive. He indicated that two of his three brothers are alive. He indicated that the status of  his neg hx is unknown.  Social History    Social History   Socioeconomic History   Marital status: Married    Spouse name: Not on file   Number of children: 2   Years of education: Not on file   Highest education level: Not on file  Occupational History   Occupation: executive recruter  Social Designer, fashion/clothing strain: Not on file   Food insecurity    Worry: Not on file    Inability: Not on file   Transportation needs    Medical: Not on file    Non-medical: Not on file  Tobacco Use   Smoking status: Light Tobacco Smoker    Types: Cigars    Last attempt to quit: 03/19/1992    Years since quitting: 26.8   Smokeless tobacco: Never Used  Substance and Sexual Activity   Alcohol use: Yes    Comment:  5 glasses wine / night   Drug use: No   Sexual activity: Not on file  Lifestyle   Physical activity    Days per week: Not on file    Minutes per session: Not on file   Stress: Not on file  Relationships   Social connections    Talks on phone: Not on file    Gets together: Not on file    Attends religious service: Not on file    Active member of club or organization: Not on file    Attends meetings of clubs or organizations: Not on file    Relationship status: Not on file   Intimate partner violence    Fear of current or ex partner: Not on file    Emotionally abused: Not on file    Physically abused: Not on file    Forced sexual activity: Not on file  Other Topics Concern   Not on file  Social History Narrative   Lives w/ wife              Review of Systems    General:  No chills, fever, night sweats or weight changes.  Cardiovascular:  No chest pain, increased dyspnea on exertion, edema, orthopnea, palpitations, paroxysmal nocturnal dyspnea. Dermatological: No rash, lesions/masses Respiratory: No cough, dyspnea Urologic: No hematuria, dysuria Abdominal:   No nausea, vomiting, diarrhea, bright red blood per rectum, melena, or  hematemesis Neurologic:  No visual changes, wkns, changes in mental status. All other systems reviewed and are otherwise negative except as noted above.  Physical Exam    VS:  BP (!) 120/56    Pulse (!) 56    Ht 5\' 10"  (1.778 m)    Wt 197 lb (89.4 kg)    BMI 28.27 kg/m  , BMI Body mass index is 28.27 kg/m. GEN: Well nourished, well developed, in no acute distress. HEENT: normal. Neck: Supple, no JVD, carotid bruits, or masses. Cardiac: RRR, no murmurs, rubs, or gallops. No clubbing, cyanosis, edema.  Radials/DP/PT 2+ and equal bilaterally.  Respiratory:  Respirations regular and unlabored, clear to auscultation bilaterally. GI: Soft, nontender, nondistended, BS + x 4. MS: no deformity or atrophy. Skin: warm and dry, no rash. Neuro:  Strength and sensation are intact. Psych: Normal affect.  Accessory Clinical Findings    ECG personally reviewed by me today-sinus bradycardia with occasional premature ventricular complexes 56 bpm- No acute changes  Echocardiogram 09/06/2018  1. The left ventricle has normal systolic function with an ejection fraction of 60-65%. The cavity size was normal. Left ventricular diastolic Doppler parameters are consistent with impaired relaxation. No evidence of left ventricular regional wall  motion abnormalities.  2. The right ventricle has normal systolic function. The cavity was normal. There is no increase in right ventricular wall thickness. Right ventricular systolic pressure is normal with an estimated pressure of 32.6 mmHg.  3. Right atrial size was mildly dilated.  Assessment &  Plan     Coronary artery disease- experiencing chest discomfort with increased physical activity, increased shortness of breath, diaphoresis and increased fatigue.  Last cardiac catheterization 2001 showed plaque regression on his aggressive medical regimen.  January 1997 inferior wall MI acute intervention of a totally occluded RCA and staged intervention for his circumflex  marginal vessel. Order nuclear stress test Continue 81 mg aspirin daily Continue Lipitor 40 mg tablet daily Continue Zetia 10 mg tablet daily Continue nitroglycerin 0.4 mg tablet as needed Increase physical activity as tolerated Heart healthy low-sodium diet-salty 6 given  Shortness of breath-experiencing chest discomfort with increased physical activity, increased shortness of breath, diaphoresis and increased fatigue.  Last cardiac catheterization 2001 showed plaque regression on his aggressive medical regimen.  January 1997 inferior wall MI acute intervention of a totally occluded RCA and staged intervention for his circumflex marginal vessel. Order nuclear stress test Continue 81 mg aspirin daily Continue Lipitor 40 mg tablet daily Continue Zetia 10 mg tablet daily Continue nitroglycerin 0.4 mg tablet as needed   Essential hypertension-BP today 120/56.  Patient brings wrist blood pressure cuff that we checked with the manual blood pressure cuff.  It was reading about 10-12 points higher systolic.  We talked about keeping blood pressure log and bring it to his next visit as well as taking blood pressure with both feet flat on the floor sitting in a chair and having arm at chest height. Continue amlodipine 10 mg tablet daily Continue hydrochlorothiazide 12.5 mg tablet daily Continue lisinopril 10 mg daily Continue metoprolol succinate 12.5 mg daily   Dizziness- cardiac event monitor showed predominantly sinus rhythm with occasional isolated PVCs and 4 beat episode of multifocal ventricular tachycardia.  No further episodes of dizziness   Hyperlipidemia-11/24/2018: Cholesterol, Total 150; HDL 77; LDL Calculated 55; Triglycerides 91 Continue atorvastatin 40 mg tablet daily  Disposition: Follow-up after nuclear stress test with APP  Deberah Pelton, NP-C 01/24/2019, 9:09 AM

## 2019-01-25 ENCOUNTER — Telehealth (HOSPITAL_COMMUNITY): Payer: Self-pay

## 2019-01-25 NOTE — Telephone Encounter (Signed)
Encounter complete. 

## 2019-01-27 ENCOUNTER — Ambulatory Visit (HOSPITAL_COMMUNITY)
Admission: RE | Admit: 2019-01-27 | Discharge: 2019-01-27 | Disposition: A | Discharge status: Home / Self Care | Payer: Medicare Other | Source: Ambulatory Visit | Attending: Cardiology | Admitting: Cardiology

## 2019-01-27 ENCOUNTER — Other Ambulatory Visit: Payer: Self-pay

## 2019-01-27 DIAGNOSIS — R0602 Shortness of breath: Secondary | ICD-10-CM

## 2019-01-27 LAB — MYOCARDIAL PERFUSION IMAGING
LV dias vol: 131 mL (ref 62–150)
LV sys vol: 66 mL
Peak HR: 81 {beats}/min
Rest HR: 64 {beats}/min
SDS: 4
SRS: 4
SSS: 8
TID: 0.97

## 2019-01-27 MED ORDER — TECHNETIUM TC 99M TETROFOSMIN IV KIT
10.5000 | PACK | Freq: Once | INTRAVENOUS | Status: AC | PRN
  Administered 2019-01-27: 10.5 via INTRAVENOUS
  Filled 2019-01-27: qty 11

## 2019-01-27 MED ORDER — REGADENOSON 0.4 MG/5ML IV SOLN
0.4000 mg | Freq: Once | INTRAVENOUS | Status: AC
  Administered 2019-01-27: 0.4 mg via INTRAVENOUS

## 2019-01-27 MED ORDER — TECHNETIUM TC 99M TETROFOSMIN IV KIT
30.5000 | PACK | Freq: Once | INTRAVENOUS | Status: AC | PRN
  Administered 2019-01-27: 30.5 via INTRAVENOUS
  Filled 2019-01-27: qty 31

## 2019-02-07 DIAGNOSIS — Z20828 Contact with and (suspected) exposure to other viral communicable diseases: Secondary | ICD-10-CM | POA: Diagnosis not present

## 2019-02-07 DIAGNOSIS — U071 COVID-19: Secondary | ICD-10-CM | POA: Diagnosis not present

## 2019-02-10 ENCOUNTER — Telehealth: Payer: Self-pay | Admitting: Internal Medicine

## 2019-02-11 NOTE — Telephone Encounter (Signed)
Patient requested physical exam, states he will call in 2 weeks

## 2019-02-14 DIAGNOSIS — S7002XA Contusion of left hip, initial encounter: Secondary | ICD-10-CM | POA: Diagnosis not present

## 2019-02-24 ENCOUNTER — Telehealth: Payer: Self-pay | Admitting: Cardiovascular Disease

## 2019-02-24 NOTE — Telephone Encounter (Signed)
Left message for patient to call and schedule follow up with Dr. Claiborne Billings per 02/24/19 staff message fro Urban Gibson, [place]

## 2019-03-03 ENCOUNTER — Telehealth (INDEPENDENT_AMBULATORY_CARE_PROVIDER_SITE_OTHER): Payer: Medicare Other | Admitting: Cardiovascular Disease

## 2019-03-03 VITALS — BP 134/65 | HR 65 | Ht 70.0 in | Wt 190.0 lb

## 2019-03-03 DIAGNOSIS — R0602 Shortness of breath: Secondary | ICD-10-CM | POA: Diagnosis not present

## 2019-03-03 DIAGNOSIS — I6523 Occlusion and stenosis of bilateral carotid arteries: Secondary | ICD-10-CM | POA: Diagnosis not present

## 2019-03-03 DIAGNOSIS — I10 Essential (primary) hypertension: Secondary | ICD-10-CM | POA: Diagnosis not present

## 2019-03-03 DIAGNOSIS — E785 Hyperlipidemia, unspecified: Secondary | ICD-10-CM | POA: Diagnosis not present

## 2019-03-03 DIAGNOSIS — R002 Palpitations: Secondary | ICD-10-CM

## 2019-03-03 DIAGNOSIS — I2510 Atherosclerotic heart disease of native coronary artery without angina pectoris: Secondary | ICD-10-CM | POA: Diagnosis not present

## 2019-03-03 NOTE — Progress Notes (Signed)
Virtual Visit via Telephone Note   This visit type was conducted due to national recommendations for restrictions regarding the COVID-19 Pandemic (e.g. social distancing) in an effort to limit this patient's exposure and mitigate transmission in our community.  Due to his co-morbid illnesses, this patient is at least at moderate risk for complications without adequate follow up.  This format is felt to be most appropriate for this patient at this time.  The patient did not have access to video technology/had technical difficulties with video requiring transitioning to audio format only (telephone).  All issues noted in this document were discussed and addressed.  No physical exam could be performed with this format.  Please refer to the patient's chart for his  consent to telehealth for [facility].   Date:  03/03/2019   ID:  Dakota Gibbs, DOB Sep 03, 1950, MRN [id number]  Patient Location: Other:  work Provider Location: Home  PCP:  Colon Branch, MD  Cardiologist:  Shelva Majestic, MD  Electrophysiologist:  None   Evaluation Performed:  Follow-Up Visit  Chief Complaint: 22-month follow-up visit  History of Present Illness:    Dakota Gibbs is a 68 y.o. male who suffered an inferior wall myocardial infarction on 05/10/1995 and underwent acute intervention to a totally occluded RCA. He had more distal disease in the posterolateral vessel which was treated medically. Several days later he underwent staged intervention to circumflex marginal vessel. His last cardiac catheterization was in 2001 which did not show restenosis and actually showed coronary artery disease regression.  He has been aggressively treated since his initial event.  When I saw him several  years ago he had noticed several episodes in the early morning while sleeping that he develops nocturnal palpitations. Upon further questioning he does snore and his snoring is more significantly abnormal particularly after  drinking alcohol. He wakes up one to 2 times per night.  He was started on Celexa for anxiety/depression which has helped.  He has a history of hyperlipidemia and has been tolerating Lipitor. He also has GERD, and hypertension.   Over the past year, he admits to a 25 pound weight loss. In contrast to his previous diet many years ago when he tried the [employer], his current diet is eating less along with few carbohydrates. His weight is reduced from 208-180 3 pounds. He is sleeping significantly improved. He is not aware of any snoring.  He continues to take amlodipine 5 mg, lisinopril 20, no grams, Toprol-XL 50 mg for hypertension and CAD.  He is on Zantac 150 mg for dyspepsia/GERD. He has more energy.  Lab work done by his primary physician had shown improvement in his hemoglobin A1c at 6.0, improved from 6.6.   I last saw him in March 2018.  At that time he continues to do well an was working in the executive recruiting business.  He denied chest pain or shortness of breath.  At times he believes he may be having some short-term memory loss.    I saw him in the clinic on a DOD on Sep 06, 2018.  However, with the COVID-19 pandemic, he had not been going to the gym.  Over the past month, he has began to notice episodes of transient lightheadedness and dizziness which typically occur during physical exertion.  He denies any frank syncope.  He denies exertional chest pain or palpitations.  He has been noticing that his blood pressure has been in the 140 to 150 range.  He  has continued to be on Toprol-XL 50 mg, amlodipine 5 mg, and lisinopril 20 mg.  He is on atorvastatin for hyperlipidemia.  He continues to be on Celexa.  He continues to be an executive recruiting but his work is significantly slowed down and have him most come to a complete halt during the [place] pandemic.    During that evaluation,his ECG revealed sinus bradycardia at 43 bpm. Laboratory also had shown elevated potassium at 5.6. I  decreased lisinopril from 20 mg down to 10 mg and titrated amlodipine to 10 mg. I provided him information regarding potassium and certain foods and fruits. An echo Doppler study as well as carotid duplex imaging were recommended.  Repeat laboratory several days later showed a potassium of 4.9. Patient did realize that he was having a fair amount of grapefruit juice and a Seabreeze drink which also contributed to his potassium elevation. His echo Doppler study showed an EF of 60 to 123456 grade 1 diastolic dysfunction. There was minimal increased RV systolic pressure at [id number] mm. There was mild right atrial dilatation. His carotid study showed mild bilateral internal carotid plaque in the 1 to 39% range with greater than 50% ECA stenoses. He had normal antegrade flow bilaterally in the vertebral arteries and his subclavian arteries were normal.  Since I last saw him, he has monitored his blood pressure and has average 40 readings. He states his blood pressure has run over 40 samples averaging 142/59. His pulse is increased slightly now in the 50s with an average of 53. He had undergone repeat laboratory which had shown his LDL cholesterol had risen to 85. He denies any episodes of chest pain or shortness of breath. However, he still notes occasional episodes of transient dizziness . He presents for evaluation  I last evaluated him in a telemedicine visit on Sep 20, 2018.  At that time his blood pressure had improved.  He was on amlodipine 10 mg, reduced dose of  lisinopril at 10 mg, and Toprol-XL 25 mg.  He continued to have some episodes of dizziness and bradycardia and during that evaluation recommended reduction of Toprol-XL to 12.5 mg daily.  I reviewed his echo Doppler data as well as his carotid duplex imaging studies.  He had normal bilateral antegrade flow to his vertebrals and he did not have any high-grade carotid stenoses.  With his LDL cholesterol of 85 on atorvastatin 40 mg I  added Zetia 10 mg.  Repeat potassium improved to 4.9 with discontinuance of his frequent grapefruit juice.  I recommended that he undergo an event monitor for further evaluation of potential arrhythmia.  He was monitored from June 30 through November 15, 2018.  The predominant rhythm was sinus rhythm with the fastest episode of sinus tachycardia at 110 bpm at 8:28 PM on July 4.  His slowest episode was sinus bradycardia at 46 bpm while sleeping at 3:50 AM on July 8.  There were occasional PVCs representing 1% of his abnormal rhythm.  There was 1 episode of 4 beats of multifocal ventricular tachycardia on July 3.  Otherwise PVCs were isolated.  There were no episodes of atrial fibrillation.  There were no pauses.   I last saw him in the office on December 08, 2018.  At that time he denied any chest pain and his presyncope had significantly improved.  He denied definitive orthostatic symptoms.  That particular day he had walked a mile without difficulty.    Since I last saw him, he developed symptoms of  increasing shortness of breath and had some vague chest pain.  He was seen on January 24, 2019 by Coletta Memos, NP.  He subsequently underwent a nuclear stress test on 01/27/2019 which essentially was unchanged from previously and showed small area of inferior scar.  There was no associated ischemia.  EF was 50%.  Over the last month, his symptoms have dramatically improved.  He is no longer having any significant shortness of breath with walking or chest pressure.  At times he still notes some mild fatigability.  He is unaware of any significant palpitations.   The patient does not have symptoms concerning for COVID-19 infection (fever, chills, cough, or new shortness of breath).    Past Medical History:  Diagnosis Date   Allergy    enviornmental   Anxiety    Anxiety and depression 10/25/2012   Back pain    L4-L5 bulging disc, L5-S1 - bulging disc, pinched nerve in neck   CAD (coronary artery  disease)      Dr Claiborne Billings, s/p stents----- sees cards yearly    Cancer [facility])    basal cell; carcinoma removed x3 and SCC   Depression    Diabetes mellitus    pt denies   GERD (gastroesophageal reflux disease)    History of shingles 2009   Myocardial infarction ([place]) 1997   Past Surgical History:  Procedure Laterality Date   ANGIOPLASTY  1997   basel cell     CARDIAC CATHETERIZATION     10/04/1999   DOPPLER ECHOCARDIOGRAPHY  07/09/2006   EF 50 to 55 %, LA mildy dilated   HERNIA REPAIR  1951   RIGHT   KNEE ARTHROSCOPY  2001   R   NM MYOCAR PERF WALL MOTION  08/22/2011   Mets 13,low risk study   WRIST SURGERY  1984   right     Current Meds  Medication Sig   amLODipine (NORVASC) 5 MG tablet Take 2 tablets (10 mg total) by mouth daily.   aspirin EC 81 MG tablet Take 1 tablet (81 mg total) by mouth daily.   atorvastatin (LIPITOR) 40 MG tablet TAKE 1 TABLET EVERY DAY AT 6 PM   azelastine (ASTELIN) 0.1 % nasal spray Place 2 sprays into both nostrils 2 (two) times daily as needed for rhinitis or allergies. Use in each nostril as directed   cetirizine (ZYRTEC) 10 MG tablet Take 10 mg by mouth daily.   citalopram (CELEXA) 20 MG tablet Take by mouth daily. PT TAKES 30 MG DAILY   famotidine (PEPCID) 20 MG tablet Take 20 mg by mouth daily.   lisinopril (ZESTRIL) 20 MG tablet Take 0.5 tablets (10 mg total) by mouth daily.   Lutein 20 MG TABS Take 20 mg by mouth daily.   metoprolol succinate (TOPROL-XL) 25 MG 24 hr tablet Take 0.5 tablets (12.5 mg total) by mouth daily. Take with or immediately following a meal.   Multiple Vitamin (MULTIVITAMIN) tablet Take 1 tablet by mouth daily.     nitroGLYCERIN (NITROSTAT) 0.4 MG SL tablet Place 1 tablet (0.4 mg total) under the tongue every 5 (five) minutes as needed for chest pain.   sildenafil (VIAGRA) 100 MG tablet Take 100 mg by mouth daily as needed.     vitamin C (ASCORBIC ACID) 500 MG tablet Take 500 mg by mouth daily.       vitamin E 400 UNIT capsule Take 400 Units by mouth daily.       Allergies:   Patient has no known allergies.  Social History   Tobacco Use   Smoking status: Light Tobacco Smoker    Types: Cigars    Last attempt to quit: 03/19/1992    Years since quitting: 26.9   Smokeless tobacco: Never Used  Substance Use Topics   Alcohol use: Yes    Comment:  5 glasses wine / night   Drug use: No     Family Hx: The patient's family history includes COPD in his mother; Coronary artery disease in his father; Diabetes in his brother; Hypertension in his sister; Prostate cancer in his brother and father; Stroke in his mother. There is no history of Colon cancer, Esophageal cancer, Colon polyps, Rectal cancer, or Stomach cancer.  ROS:   Please see the history of present illness.    No fevers chills night sweats No visual changes No wheezing No exertional chest tightness Shortness of breath has improved No abdominal discomfort Occasional mild ankle edema Sleeping adequately but at times not sleep that well.  He denies any snoring or waking up gasping for breath All other systems reviewed and are negative.   Prior CV studies:   The following studies were reviewed today:  Echocardiogram 09/06/2018 1. The left ventricle has normal systolic function with an ejection fraction of 60-65%. The cavity size was normal. Left ventricular diastolic Doppler parameters are consistent with impaired relaxation. No evidence of left ventricular regional wall  motion abnormalities. 2. The right ventricle has normal systolic function. The cavity was normal. There is no increase in right ventricular wall thickness. Right ventricular systolic pressure is normal with an estimated pressure of 32.6 mmHg. 3. Right atrial size was mildly dilated.  Nuclear Study Highlights: 01/27/2019   The left ventricular ejection fraction is mildly decreased (45-54%).  Nuclear stress EF: 50%. Inferior wall  hypokinesis  There was no ST segment deviation noted during stress.  Defect 1: There is a medium defect of severe severity present in the basal inferior, mid inferior and apical inferior location.  Findings consistent with prior inferior myocardial infarction.  This is an overall low risk study. No ischemia.     Labs/Other Tests and Data Reviewed:    EKG: ECG from December 08, 2018 was personally reviewed and shows normal normal sinus rhythm at 61 bpm  Recent Labs: 08/30/2018: Hemoglobin 13.1; Platelets 253; TSH 2.320 11/24/2018: ALT 24; BUN 17; Creatinine, Ser 0.76; Potassium 4.9; Sodium 139   Recent Lipid Panel Lab Results  Component Value Date/Time   CHOL 150 11/24/2018 08:43 AM   TRIG 91 11/24/2018 08:43 AM   HDL 77 11/24/2018 08:43 AM   CHOLHDL 1.9 11/24/2018 08:43 AM   CHOLHDL 2.6 06/27/2016 08:31 AM   LDLCALC 55 11/24/2018 08:43 AM   LDLDIRECT 83.0 06/16/2014 08:58 AM    Wt Readings from Last 3 Encounters:  03/03/19 190 lb (86.2 kg)  01/27/19 197 lb (89.4 kg)  01/24/19 197 lb (89.4 kg)     Objective:    Vital Signs:  BP 134/65    Pulse 65    Ht 5\' 10"  (1.778 m)    Wt 190 lb (86.2 kg)    BMI 27.26 kg/m    Wt Readings from Last 3 Encounters:  03/03/19 190 lb (86.2 kg)  01/27/19 197 lb (89.4 kg)  01/24/19 197 lb (89.4 kg)    Since this was a phone visit I was unable to perform a physical exam Breathing was normal and not labored There was no audible wheezing He denied any chest discomfort to palpation He was  unaware of any irregularity to his heart rhythm At times he admits to mild ankle edema Occasional fatigability No neurologic symptoms Mild issues with sleeping but he believes he is sleeping adequately   ASSESSMENT & PLAN:    1. CAD: He is status post inferior MI January 1997 with CI to his RCA and subsequent staged intervention to circumflex marginal vessel.  Last catheterization in 2001 showed plaque regression on aggressive medical therapy.  I  reviewed his recent nuclear perfusion study with him in detail which shows small area of scar inferiorly consistent with his prior MI.  No additional ischemia was noted. 2. Hyperlipidemia: Currently on Lipitor 40 mg and Zetia 10 mg target LDL less than 70.  November 24, 2018 laboratory revealed total cholesterol 150, HDL 77, LDL 55 and triglycerides 91.  Zetia was added to achieve LDL less than 70 3. Essential hypertension: Blood pressure today is stable.  No orthostatic symptoms.  Currently on amlodipine 10 mg, HCTZ 12.5 mg, lisinopril 10 mg, metoprolol succinate 12.5 mg. 4. Palpitations: Improved with prior documentation of PVCs and one episode of multifocal VT on monitoring.  Continue current dose of metoprolol succinate 12.5 mg. 5. Shortness of breath: Improved.  EF 60 to 65% with grade 1 diastolic dysfunction.  He has been taking HCTZ in addition to his other medications for hypertension.  COVID-19 Education: The signs and symptoms of COVID-19 were discussed with the patient and how to seek care for testing (follow up with PCP or arrange E-visit).  The importance of social distancing was discussed today.  Time:   Today, I have spent 20 minutes with the patient with telehealth technology discussing the above problems.     Medication Adjustments/Labs and Tests Ordered: Current medicines are reviewed at length with the patient today.  Concerns regarding medicines are outlined above.   Tests Ordered: No orders of the defined types were placed in this encounter.   Medication Changes: No orders of the defined types were placed in this encounter.   Follow Up:  6 months  Signed, Shelva Majestic, MD  03/03/2019 8:28 AM    [place]

## 2019-03-03 NOTE — Patient Instructions (Addendum)
Follow-Up: IN 6 months Please call our office 2 months in advance, end of JAN/FEB 2021 to schedule this APR/MAY 2021 appointment. In Person You may see Shelva Majestic, MD or one of the following Advanced Practice Providers on your designated Care Team:  Almyra Deforest, PA-C  Fabian Sharp, PA-C or  [place], [place].     Medication Instructions:  The current medical regimen is effective;  continue present plan and medications as directed. Please refer to the Current Medication list given to you today. If you need a refill on your cardiac medications before your next appointment, please call your pharmacy.  At [facility], you and your health needs are our priority.  As part of our continuing mission to provide you with exceptional heart care, we have created designated Provider Care Teams.  These Care Teams include your primary Cardiologist (physician) and Advanced Practice Providers (APPs -  Physician Assistants and Nurse Practitioners) who all work together to provide you with the care you need, when you need it.  Thank you for choosing [facility] at [facility]!!

## 2019-03-11 ENCOUNTER — Encounter: Payer: Self-pay | Admitting: Internal Medicine

## 2019-03-11 MED ORDER — AZELASTINE HCL 0.1 % NA SOLN: 2.0000 | Freq: Two times a day (BID) | NASAL | 3 refills | Status: DC | PRN

## 2019-03-14 ENCOUNTER — Encounter: Payer: Self-pay | Admitting: Internal Medicine

## 2019-03-14 ENCOUNTER — Other Ambulatory Visit: Payer: Self-pay | Admitting: Cardiovascular Disease

## 2019-03-14 ENCOUNTER — Other Ambulatory Visit: Payer: Self-pay

## 2019-03-14 MED ORDER — EZETIMIBE 10 MG PO TABS: 10.0000 mg | ORAL_TABLET | Freq: Every day | ORAL | 1 refills | Status: DC

## 2019-03-14 MED ORDER — AMLODIPINE BESYLATE 5 MG PO TABS: 10.0000 mg | ORAL_TABLET | Freq: Every day | ORAL | 1 refills | Status: DC

## 2019-03-14 MED ORDER — HYDROCHLOROTHIAZIDE 12.5 MG PO CAPS: 12.5000 mg | ORAL_CAPSULE | Freq: Every day | ORAL | 1 refills | Status: DC

## 2019-03-25 ENCOUNTER — Other Ambulatory Visit: Payer: Self-pay | Admitting: Internal Medicine

## 2019-03-30 ENCOUNTER — Telehealth: Payer: Self-pay

## 2019-03-30 NOTE — Telephone Encounter (Signed)
Fax was sent on 03/14/2019 for colonoscopy clearance.

## 2019-04-06 DIAGNOSIS — M5416 Radiculopathy, lumbar region: Secondary | ICD-10-CM | POA: Diagnosis not present

## 2019-04-15 DIAGNOSIS — M5136 Other intervertebral disc degeneration, lumbar region: Secondary | ICD-10-CM | POA: Diagnosis not present

## 2019-04-18 DIAGNOSIS — H25013 Cortical age-related cataract, bilateral: Secondary | ICD-10-CM | POA: Diagnosis not present

## 2019-04-18 DIAGNOSIS — H25041 Posterior subcapsular polar age-related cataract, right eye: Secondary | ICD-10-CM | POA: Diagnosis not present

## 2019-04-18 DIAGNOSIS — H2513 Age-related nuclear cataract, bilateral: Secondary | ICD-10-CM | POA: Diagnosis not present

## 2019-04-19 ENCOUNTER — Encounter: Payer: Self-pay | Admitting: Internal Medicine

## 2019-05-02 DIAGNOSIS — H2513 Age-related nuclear cataract, bilateral: Secondary | ICD-10-CM | POA: Diagnosis not present

## 2019-05-02 DIAGNOSIS — H2511 Age-related nuclear cataract, right eye: Secondary | ICD-10-CM | POA: Diagnosis not present

## 2019-05-24 ENCOUNTER — Telehealth: Payer: Self-pay | Admitting: *Deleted

## 2019-05-24 NOTE — Telephone Encounter (Signed)
   [facility] Pre-operative Risk Assessment    Request for surgical clearance:  1. What type of surgery is being performed? Cataract extraction with intraocular lens implantation of the left eye followed by the right eye   2. When is this surgery scheduled? 05/31/19, 06/07/19   3. What type of clearance is required (medical clearance vs. Pharmacy clearance to hold med vs. Both)? medical  4. Are there any medications that need to be held prior to surgery and how long? no   5. Practice name and name of physician performing surgery? [facility] [facility] [place], Dr. Peterson Ao Stonecipher   6. What is your office phone number [phone]    7.   What is your office fax number [phone]  8.   Anesthesia type (None, local, MAC, general) ? topical   Dakota Gibbs [name] 05/24/2019, 5:15 PM  _________________________________________________________________

## 2019-05-25 NOTE — Telephone Encounter (Signed)
   Primary Cardiologist: Shelva Majestic, MD  Chart reviewed as part of pre-operative protocol coverage. Cataract extractions are recognized in guidelines as low risk surgeries that do not typically require specific preoperative testing or holding of blood thinner therapy. Therefore, given past medical history and time since last visit, based on ACC/AHA guidelines, NOE GLOD would be at acceptable risk for the planned procedure without further cardiovascular testing.   I will route this recommendation to the requesting party via Epic fax function and remove from pre-op pool.  Please call with questions.  Daune Perch, NP 05/25/2019, 9:16 AM

## 2019-05-31 DIAGNOSIS — H25012 Cortical age-related cataract, left eye: Secondary | ICD-10-CM | POA: Diagnosis not present

## 2019-05-31 DIAGNOSIS — H25812 Combined forms of age-related cataract, left eye: Secondary | ICD-10-CM | POA: Diagnosis not present

## 2019-05-31 DIAGNOSIS — H2512 Age-related nuclear cataract, left eye: Secondary | ICD-10-CM | POA: Diagnosis not present

## 2019-05-31 DIAGNOSIS — H2511 Age-related nuclear cataract, right eye: Secondary | ICD-10-CM | POA: Diagnosis not present

## 2019-05-31 DIAGNOSIS — H25041 Posterior subcapsular polar age-related cataract, right eye: Secondary | ICD-10-CM | POA: Diagnosis not present

## 2019-05-31 DIAGNOSIS — H25011 Cortical age-related cataract, right eye: Secondary | ICD-10-CM | POA: Diagnosis not present

## 2019-06-03 ENCOUNTER — Ambulatory Visit: Payer: Medicare Other

## 2019-06-07 DIAGNOSIS — H25811 Combined forms of age-related cataract, right eye: Secondary | ICD-10-CM | POA: Diagnosis not present

## 2019-06-07 DIAGNOSIS — H2511 Age-related nuclear cataract, right eye: Secondary | ICD-10-CM | POA: Diagnosis not present

## 2019-06-07 DIAGNOSIS — H25011 Cortical age-related cataract, right eye: Secondary | ICD-10-CM | POA: Diagnosis not present

## 2019-06-08 ENCOUNTER — Other Ambulatory Visit: Payer: Self-pay | Admitting: Internal Medicine

## 2019-06-08 ENCOUNTER — Ambulatory Visit: Payer: Medicare Other

## 2019-06-11 ENCOUNTER — Ambulatory Visit: Payer: Medicare Other | Attending: Internal Medicine

## 2019-06-11 DIAGNOSIS — Z23 Encounter for immunization: Secondary | ICD-10-CM

## 2019-06-11 NOTE — Progress Notes (Signed)
   Covid-19 Vaccination Clinic  Name:  Dakota Gibbs    MRN: [id number] DOB: May 06, 1950  06/11/2019  [name] was observed post Covid-19 immunization for 15 minutes without incidence. He was provided with Vaccine Information Sheet and instruction to access the V-Safe system.   [name] was instructed to call 911 with any severe reactions post vaccine: Marland Kitchen Difficulty breathing  . Swelling of your face and throat  . A fast heartbeat  . A bad rash all over your body  . Dizziness and weakness    Immunizations Administered    Name Date Dose VIS Date Route   Pfizer COVID-19 Vaccine 06/11/2019  2:15 PM 0.3 mL 04/15/2019 Intramuscular   Manufacturer: [place]   Lot: [id number]   [place]: [id number]

## 2019-06-13 DIAGNOSIS — H2512 Age-related nuclear cataract, left eye: Secondary | ICD-10-CM | POA: Diagnosis not present

## 2019-06-13 DIAGNOSIS — H25012 Cortical age-related cataract, left eye: Secondary | ICD-10-CM | POA: Diagnosis not present

## 2019-06-15 ENCOUNTER — Telehealth: Payer: Self-pay

## 2019-06-15 ENCOUNTER — Encounter: Payer: Self-pay | Admitting: Internal Medicine

## 2019-06-15 NOTE — Telephone Encounter (Signed)
That is okay, thank you 

## 2019-06-15 NOTE — Telephone Encounter (Signed)
Received mychart message from Pt today- requesting transfer of care from Dr. Larose Kells to Dr. Ethelene Hal at [place] due to convenience of location. Please advise.

## 2019-06-16 NOTE — Telephone Encounter (Signed)
Done

## 2019-06-16 NOTE — Telephone Encounter (Signed)
Okay with me 

## 2019-06-18 ENCOUNTER — Other Ambulatory Visit: Payer: Self-pay | Admitting: Cardiovascular Disease

## 2019-06-20 DIAGNOSIS — H25011 Cortical age-related cataract, right eye: Secondary | ICD-10-CM | POA: Diagnosis not present

## 2019-06-20 DIAGNOSIS — H2511 Age-related nuclear cataract, right eye: Secondary | ICD-10-CM | POA: Diagnosis not present

## 2019-07-06 ENCOUNTER — Ambulatory Visit: Payer: Medicare Other | Attending: Internal Medicine

## 2019-07-06 DIAGNOSIS — Z23 Encounter for immunization: Secondary | ICD-10-CM

## 2019-07-06 NOTE — Progress Notes (Signed)
   Covid-19 Vaccination Clinic  Name:  Dakota Gibbs    MRN: [id number] DOB: 10-02-1950  07/06/2019  [name] was observed post Covid-19 immunization for 15 minutes without incident. He was provided with Vaccine Information Sheet and instruction to access the V-Safe system.   [name] was instructed to call 911 with any severe reactions post vaccine: Marland Kitchen Difficulty breathing  . Swelling of face and throat  . A fast heartbeat  . A bad rash all over body  . Dizziness and weakness   Immunizations Administered    Name Date Dose VIS Date Route   Pfizer COVID-19 Vaccine 07/06/2019  9:19 AM 0.3 mL 04/15/2019 Intramuscular   Manufacturer: [place]   Lot: [id number]   [place]: [id number]

## 2019-07-15 ENCOUNTER — Encounter: Payer: Medicare Other | Admitting: Family Medicine

## 2019-07-21 ENCOUNTER — Other Ambulatory Visit: Payer: Self-pay

## 2019-07-22 ENCOUNTER — Ambulatory Visit (INDEPENDENT_AMBULATORY_CARE_PROVIDER_SITE_OTHER): Payer: Medicare Other | Admitting: Family Medicine

## 2019-07-22 ENCOUNTER — Encounter: Payer: Self-pay | Admitting: Family Medicine

## 2019-07-22 VITALS — BP 114/62 | HR 59 | Temp 96.8°F | Ht 69.0 in | Wt 192.4 lb

## 2019-07-22 DIAGNOSIS — Z7289 Other problems related to lifestyle: Secondary | ICD-10-CM

## 2019-07-22 DIAGNOSIS — Z0000 Encounter for general adult medical examination without abnormal findings: Secondary | ICD-10-CM

## 2019-07-22 DIAGNOSIS — Z720 Tobacco use: Secondary | ICD-10-CM | POA: Diagnosis not present

## 2019-07-22 DIAGNOSIS — Z125 Encounter for screening for malignant neoplasm of prostate: Secondary | ICD-10-CM

## 2019-07-22 DIAGNOSIS — F329 Major depressive disorder, single episode, unspecified: Secondary | ICD-10-CM | POA: Diagnosis not present

## 2019-07-22 DIAGNOSIS — E1142 Type 2 diabetes mellitus with diabetic polyneuropathy: Secondary | ICD-10-CM

## 2019-07-22 DIAGNOSIS — I10 Essential (primary) hypertension: Secondary | ICD-10-CM

## 2019-07-22 DIAGNOSIS — E119 Type 2 diabetes mellitus without complications: Secondary | ICD-10-CM | POA: Insufficient documentation

## 2019-07-22 DIAGNOSIS — F32A Depression, unspecified: Secondary | ICD-10-CM

## 2019-07-22 DIAGNOSIS — F419 Anxiety disorder, unspecified: Secondary | ICD-10-CM

## 2019-07-22 DIAGNOSIS — E785 Hyperlipidemia, unspecified: Secondary | ICD-10-CM

## 2019-07-22 DIAGNOSIS — Z789 Other specified health status: Secondary | ICD-10-CM

## 2019-07-22 DIAGNOSIS — F1090 Alcohol use, unspecified, uncomplicated: Secondary | ICD-10-CM | POA: Insufficient documentation

## 2019-07-22 LAB — CBC
HCT: 41.9 % (ref 39.0–52.0)
Hemoglobin: 14.2 g/dL (ref 13.0–17.0)
MCHC: 33.9 g/dL (ref 30.0–36.0)
MCV: 94.1 fl (ref 78.0–100.0)
Platelets: 246 10*3/uL (ref 150.0–400.0)
RBC: 4.45 Mil/uL (ref 4.22–5.81)
RDW: 13.8 % (ref 11.5–15.5)
WBC: 7.4 10*3/uL (ref 4.0–10.5)

## 2019-07-22 LAB — COMPREHENSIVE METABOLIC PANEL
ALT: 16 U/L (ref 0–53)
AST: 17 U/L (ref 0–37)
Albumin: 4 g/dL (ref 3.5–5.2)
Alkaline Phosphatase: 86 U/L (ref 39–117)
BUN: 14 mg/dL (ref 6–23)
CO2: 29 mEq/L (ref 19–32)
Calcium: 9.2 mg/dL (ref 8.4–10.5)
Chloride: 100 mEq/L (ref 96–112)
Creatinine, Ser: 0.86 mg/dL (ref 0.40–1.50)
GFR: 87.74 mL/min (ref >60.00)
Glucose, Bld: 105 mg/dL — ABNORMAL HIGH (ref 70–99)
Potassium: 4.4 mEq/L (ref 3.5–5.1)
Sodium: 137 mEq/L (ref 135–145)
Total Bilirubin: 1 mg/dL (ref 0.2–1.2)
Total Protein: 6.9 g/dL (ref 6.0–8.3)

## 2019-07-22 LAB — URINALYSIS, ROUTINE W REFLEX MICROSCOPIC
Hgb urine dipstick: NEGATIVE
Leukocytes,Ua: NEGATIVE
Nitrite: NEGATIVE
RBC / HPF: NONE SEEN (ref 0–?)
Specific Gravity, Urine: 1.02 (ref 1.000–1.030)
Urine Glucose: NEGATIVE
Urobilinogen, UA: 1 (ref 0.0–1.0)
WBC, UA: NONE SEEN (ref 0–?)
pH: 7 (ref 5.0–8.0)

## 2019-07-22 LAB — LIPID PANEL
Cholesterol: 160 mg/dL (ref 0–200)
HDL: 67.9 mg/dL (ref >39.00)
LDL Cholesterol: 63 mg/dL (ref 0–99)
NonHDL: 92.41
Total CHOL/HDL Ratio: 2
Triglycerides: 146 mg/dL (ref 0.0–149.0)
VLDL: 29.2 mg/dL (ref 0.0–40.0)

## 2019-07-22 LAB — HEMOGLOBIN A1C: Hgb A1c MFr Bld: 6 % (ref 4.6–6.5)

## 2019-07-22 LAB — MICROALBUMIN / CREATININE URINE RATIO
Creatinine,U: 250 mg/dL
Microalb Creat Ratio: 1.3 mg/g (ref 0.0–30.0)
Microalb, Ur: 3.1 mg/dL — ABNORMAL HIGH (ref 0.0–1.9)

## 2019-07-22 LAB — PSA: PSA: 1.09 ng/mL (ref 0.10–4.00)

## 2019-07-22 LAB — LDL CHOLESTEROL, DIRECT: Direct LDL: 66 mg/dL

## 2019-07-22 NOTE — Progress Notes (Signed)
Established Patient Office Visit  Subjective:  Patient ID: Dakota Gibbs, male    DOB: 1950-08-25  Age: 69 y.o. MRN: [id number]  CC:  Chief Complaint  Patient presents with  . Transitions Of Care    annual exam, TOC from Dr. Larose Kells, no concerns.     HPI Dakota Gibbs presents for transfer of care and a complete physical and follow-up for his hypertension, diet-controlled diabetes, hyperlipidemia with history of coronary artery disease and diastolic heart failure, anxiety and depression, alcohol and tobacco use.  He is seeing cardiology for cardiac related issues.  Last LDL cholesterol was 55.  Tells me that his exercise is somewhat limited due to his heart issues.  He is unable to exert himself to any extent without becoming short of breath.  Tells me that cardiology is working with him on this issue.  Diabetes has been diet controlled.  Anxiety of depression controlled with the Lexapro.  However, he tells me that he is stressed.  He has thought that things would be better off if he were not here.  He denies any intention of self-harm.  He continues to work because he feels as though he has to maintain lifestyle for his family.  He is drinking at least 3 glasses of wine nightly.  Continues to smoke cigars a couple of times weekly.  Past Medical History:  Diagnosis Date  . Allergy    enviornmental  . Anxiety   . Anxiety and depression 10/25/2012  . Back pain    L4-L5 bulging disc, L5-S1 - bulging disc, pinched nerve in neck  . CAD (coronary artery disease)      Dr Claiborne Billings, s/p stents----- sees cards yearly   . Cancer ([place])    basal cell; carcinoma removed x3 and SCC  . Depression   . Diabetes mellitus    pt denies  . GERD (gastroesophageal reflux disease)   . History of shingles 2009  . Myocardial infarction ([place]) 1997    Past Surgical History:  Procedure Laterality Date  . ANGIOPLASTY  1997  . basel cell    . CARDIAC CATHETERIZATION     10/04/1999  . DOPPLER  ECHOCARDIOGRAPHY  07/09/2006   EF 50 to 55 %, LA mildy dilated  . HERNIA REPAIR  1951   RIGHT  . KNEE ARTHROSCOPY  2001   R  . NM MYOCAR PERF WALL MOTION  08/22/2011   Mets 13,low risk study  . WRIST SURGERY  1984   right    Family History  Problem Relation Age of Onset  . Stroke Mother        TIA  . COPD Mother   . Hypertension Sister   . Prostate cancer Father        Father and brother   . Coronary artery disease Father        GM  . Prostate cancer Brother   . Diabetes Brother        B, GM, other fami;ly members   . Colon cancer Neg Hx   . Esophageal cancer Neg Hx   . Colon polyps Neg Hx   . Rectal cancer Neg Hx   . Stomach cancer Neg Hx     Social History   Socioeconomic History  . Marital status: Married    Spouse name: Not on file  . Number of children: 2  . Years of education: Not on file  . Highest education level: Not on file  Occupational History  . Occupation: executive  recruter   Tobacco Use  . Smoking status: Light Tobacco Smoker    Types: Cigars    Last attempt to quit: 03/19/1992    Years since quitting: 27.3  . Smokeless tobacco: Never Used  Substance and Sexual Activity  . Alcohol use: Yes    Comment:  3 glasses wine / night  . Drug use: No  . Sexual activity: Not on file  Other Topics Concern  . Not on file  Social History Narrative   Lives w/ wife            Social Determinants of Health   Financial Resource Strain:   . Difficulty of Paying Living Expenses:   Food Insecurity:   . Worried About Charity fundraiser in the Last Year:   . Arboriculturist in the Last Year:   Transportation Needs:   . Film/video editor (Medical):   Marland Kitchen Lack of Transportation (Non-Medical):   Physical Activity:   . Days of Exercise per Week:   . Minutes of Exercise per Session:   Stress:   . Feeling of Stress :   Social Connections:   . Frequency of Communication with Friends and Family:   . Frequency of Social Gatherings with Friends and  Family:   . Attends Religious Services:   . Active Member of Clubs or Organizations:   . Attends Archivist Meetings:   Marland Kitchen Marital Status:   Intimate Partner Violence:   . Fear of Current or Ex-Partner:   . Emotionally Abused:   Marland Kitchen Physically Abused:   . Sexually Abused:     Outpatient Medications Prior to Visit  Medication Sig Dispense Refill  . amLODipine (NORVASC) 5 MG tablet TAKE 2 TABLETS EVERY DAY 180 tablet 2  . aspirin EC 81 MG tablet Take 1 tablet (81 mg total) by mouth daily. 90 tablet 3  . atorvastatin (LIPITOR) 40 MG tablet TAKE 1 TABLET EVERY DAY AT 6 PM 90 tablet 3  . azelastine (ASTELIN) 0.1 % nasal spray Place 2 sprays into both nostrils 2 (two) times daily as needed for rhinitis or allergies. Use in each nostril as directed 90 mL 3  . cetirizine (ZYRTEC) 10 MG tablet Take 10 mg by mouth daily.    . citalopram (CELEXA) 20 MG tablet Take 1.5 tablets (30 mg total) by mouth daily. 135 tablet 0  . ezetimibe (ZETIA) 10 MG tablet TAKE 1 TABLET EVERY DAY 90 tablet 2  . famotidine (PEPCID) 20 MG tablet Take 20 mg by mouth daily.    . hydrochlorothiazide (MICROZIDE) 12.5 MG capsule TAKE 1 CAPSULE EVERY DAY 90 capsule 2  . lisinopril (ZESTRIL) 20 MG tablet TAKE 1/2 TABLET EVERY DAY 45 tablet 1  . Lutein 20 MG TABS Take 20 mg by mouth daily.    . metoprolol succinate (TOPROL-XL) 25 MG 24 hr tablet Take 0.5 tablets (12.5 mg total) by mouth daily. Take with or immediately following a meal. 90 tablet 1  . Multiple Vitamin (MULTIVITAMIN) tablet Take 1 tablet by mouth daily.      . nitroGLYCERIN (NITROSTAT) 0.4 MG SL tablet Place 1 tablet (0.4 mg total) under the tongue every 5 (five) minutes as needed for chest pain. 25 tablet 1  . sildenafil (VIAGRA) 100 MG tablet Take 100 mg by mouth daily as needed.      . vitamin C (ASCORBIC ACID) 500 MG tablet Take 500 mg by mouth daily.      . vitamin E 400 UNIT capsule  Take 400 Units by mouth daily.       No facility-administered  medications prior to visit.    No Known Allergies  ROS Review of Systems  Constitutional: Negative.   HENT: Negative.   Eyes: Negative for photophobia and visual disturbance.  Respiratory: Positive for shortness of breath. Negative for chest tightness and wheezing.   Cardiovascular: Negative for chest pain and leg swelling.  Gastrointestinal: Negative.   Endocrine: Negative for polyphagia and polyuria.  Genitourinary: Negative for difficulty urinating, frequency and urgency.  Musculoskeletal: Negative for back pain and gait problem.  Skin: Negative for pallor and rash.  Allergic/Immunologic: Negative for immunocompromised state.  Neurological: Negative for light-headedness and headaches.  Hematological: Does not bruise/bleed easily.  Psychiatric/Behavioral: Negative.    Depression screen [facility] 2/9 07/22/2019 07/22/2019 06/01/2017  Decreased Interest 1 0 0  Down, Depressed, Hopeless 1 0 0  PHQ - 2 Score 2 0 0  Altered sleeping 2 - -  Tired, decreased energy 3 - -  Change in appetite 0 - -  Feeling bad or failure about yourself  0 - -  Trouble concentrating 1 - -  Moving slowly or fidgety/restless 0 - -  Suicidal thoughts 1 - -  PHQ-9 Score 9 - -  Difficult doing work/chores Somewhat difficult - -      Objective:    Physical Exam  Constitutional: He is oriented to person, place, and time. He appears well-developed and well-nourished. No distress.  HENT:  Head: Normocephalic and atraumatic.  Right Ear: External ear normal.  Left Ear: External ear normal.  Eyes: Conjunctivae are normal. Right eye exhibits no discharge. Left eye exhibits no discharge. No scleral icterus.  Neck: No JVD present. No tracheal deviation present. No thyromegaly present.  Cardiovascular: Normal rate, regular rhythm and normal heart sounds.  Pulmonary/Chest: Effort normal and breath sounds normal. No stridor.  Abdominal: Bowel sounds are normal.  Genitourinary: Rectum:     Guaiac result negative.      No rectal mass, anal fissure, tenderness, external hemorrhoid, internal hemorrhoid or abnormal anal tone.  Prostate is enlarged. Prostate is not tender.  Musculoskeletal:        General: No edema.  Lymphadenopathy:    He has no cervical adenopathy.  Neurological: He is alert and oriented to person, place, and time.  Skin: Skin is warm and dry. He is not diaphoretic.  Psychiatric: He has a normal mood and affect. His behavior is normal.    BP 114/62   Pulse (!) 59   Temp (!) 96.8 F (36 C) (Tympanic)   Ht 5\' 9"  (1.753 m)   Wt 192 lb 6.4 oz (87.3 kg)   SpO2 96%   BMI 28.41 kg/m  Wt Readings from Last 3 Encounters:  07/22/19 192 lb 6.4 oz (87.3 kg)  03/03/19 190 lb (86.2 kg)  01/27/19 197 lb (89.4 kg)     Health Maintenance Due  Topic Date Due  . Hepatitis C Screening  Never done  . FOOT EXAM  02/04/2017  . HEMOGLOBIN A1C  03/01/2019    There are no preventive care reminders to display for this patient.  Lab Results  Component Value Date   TSH 2.320 08/30/2018   Lab Results  Component Value Date   WBC 6.3 08/30/2018   HGB 13.1 08/30/2018   HCT 39.4 08/30/2018   MCV 98 (H) 08/30/2018   PLT 253 08/30/2018   Lab Results  Component Value Date   NA 139 11/24/2018   K 4.9  11/24/2018   CO2 24 11/24/2018   GLUCOSE 111 (H) 11/24/2018   BUN 17 11/24/2018   CREATININE 0.76 11/24/2018   BILITOT 0.6 11/24/2018   ALKPHOS 69 11/24/2018   AST 20 11/24/2018   ALT 24 11/24/2018   PROT 6.4 11/24/2018   ALBUMIN 4.2 11/24/2018   CALCIUM 9.2 11/24/2018   GFR 92.96 02/05/2016   Lab Results  Component Value Date   CHOL 150 11/24/2018   Lab Results  Component Value Date   HDL 77 11/24/2018   Lab Results  Component Value Date   LDLCALC 55 11/24/2018   Lab Results  Component Value Date   TRIG 91 11/24/2018   Lab Results  Component Value Date   CHOLHDL 1.9 11/24/2018   Lab Results  Component Value Date   HGBA1C 5.7 (H) 08/30/2018      Assessment & Plan:     Problem List Items Addressed This Visit      Cardiovascular and Mediastinum   Essential hypertension   Relevant Orders   CBC   Comprehensive metabolic panel     Endocrine   Type 2 diabetes mellitus with diabetic polyneuropathy (HCC) - Primary   Relevant Orders   Comprehensive metabolic panel   Hemoglobin A1c   Urinalysis, Routine w reflex microscopic   Microalbumin / creatinine urine ratio     Other   Anxiety and depression   Healthcare maintenance   Relevant Orders   PSA   Hyperlipidemia with target LDL less than 70   Relevant Orders   Comprehensive metabolic panel   LDL cholesterol, direct   Lipid panel   Alcohol use   Tobacco use      No orders of the defined types were placed in this encounter.   Follow-up: Return in about 6 months (around 01/22/2020).  Patient will be following up with cardiology in a few months.  Advised him to stop smoking.  He was given information on the dangers of smoking.  Expressed concern about his current alcohol usage.  Suggested talking therapy and he declined for now.  Libby Maw, MD

## 2019-07-22 NOTE — Patient Instructions (Signed)
Health Maintenance After Age 71 After age 39, you are at a higher risk for certain long-term diseases and infections as well as injuries from falls. Falls are a major cause of broken bones and head injuries in people who are older than age 66. Getting regular preventive care can help to keep you healthy and well. Preventive care includes getting regular testing and making lifestyle changes as recommended by your health care provider. Talk with your health care provider about:  Which screenings and tests you should have. A screening is a test that checks for a disease when you have no symptoms.  A diet and exercise plan that is right for you. What should I know about screenings and tests to prevent falls? Screening and testing are the best ways to find a health problem early. Early diagnosis and treatment give you the best chance of managing medical conditions that are common after age 8. Certain conditions and lifestyle choices may make you more likely to have a fall. Your health care provider may recommend:  Regular vision checks. Poor vision and conditions such as cataracts can make you more likely to have a fall. If you wear glasses, make sure to get your prescription updated if your vision changes.  Medicine review. Work with your health care provider to regularly review all of the medicines you are taking, including over-the-counter medicines. Ask your health care provider about any side effects that may make you more likely to have a fall. Tell your health care provider if any medicines that you take make you feel dizzy or sleepy.  Osteoporosis screening. Osteoporosis is a condition that causes the bones to get weaker. This can make the bones weak and cause them to break more easily.  Blood pressure screening. Blood pressure changes and medicines to control blood pressure can make you feel dizzy.  Strength and balance checks. Your health care provider may recommend certain tests to check your  strength and balance while standing, walking, or changing positions.  Foot health exam. Foot pain and numbness, as well as not wearing proper footwear, can make you more likely to have a fall.  Depression screening. You may be more likely to have a fall if you have a fear of falling, feel emotionally low, or feel unable to do activities that you used to do.  Alcohol use screening. Using too much alcohol can affect your balance and may make you more likely to have a fall. What actions can I take to lower my risk of falls? General instructions  Talk with your health care provider about your risks for falling. Tell your health care provider if: ? You fall. Be sure to tell your health care provider about all falls, even ones that seem minor. ? You feel dizzy, sleepy, or off-balance.  Take over-the-counter and prescription medicines only as told by your health care provider. These include any supplements.  Eat a healthy diet and maintain a healthy weight. A healthy diet includes low-fat dairy products, low-fat (lean) meats, and fiber from whole grains, beans, and lots of fruits and vegetables. Home safety  Remove any tripping hazards, such as rugs, cords, and clutter.  Install safety equipment such as grab bars in bathrooms and safety rails on stairs.  Keep rooms and walkways well-lit. Activity   Follow a regular exercise program to stay fit. This will help you maintain your balance. Ask your health care provider what types of exercise are appropriate for you.  If you need a cane or  walker, use it as recommended by your health care provider.  Wear supportive shoes that have nonskid soles. Lifestyle  Do not drink alcohol if your health care provider tells you not to drink.  If you drink alcohol, limit how much you have: ? 0-1 drink a day for women. ? 0-2 drinks a day for men.  Be aware of how much alcohol is in your drink. In the [place] one drink equals one typical bottle of beer (12  oz), one-half glass of wine (5 oz), or one shot of hard liquor (1 oz).  Do not use any products that contain nicotine or tobacco, such as cigarettes and e-cigarettes. If you need help quitting, ask your health care provider. Summary  Having a healthy lifestyle and getting preventive care can help to protect your health and wellness after age 39.  Screening and testing are the best way to find a health problem early and help you avoid having a fall. Early diagnosis and treatment give you the best chance for managing medical conditions that are more common for people who are older than age 70.  Falls are a major cause of broken bones and head injuries in people who are older than age 71. Take precautions to prevent a fall at home.  Work with your health care provider to learn what changes you can make to improve your health and wellness and to prevent falls. This information is not intended to replace advice given to you by your health care provider. Make sure you discuss any questions you have with your health care provider. Document Revised: 08/12/2018 Document Reviewed: 03/04/2017 Elsevier Patient Education  2020 [place] Risks of Smoking Smoking cigarettes is very bad for your health. Tobacco smoke has over 200 known poisons in it. It contains the poisonous gases nitrogen oxide and carbon monoxide. There are over 60 chemicals in tobacco smoke that cause cancer. Smoking is difficult to quit because a chemical in tobacco, called nicotine, causes addiction or dependence. When you smoke and inhale, nicotine is absorbed rapidly into the bloodstream through your lungs. Both inhaled and non-inhaled nicotine may be addictive. What are the risks of cigarette smoke? Cigarette smokers have an increased risk of many serious medical problems, including:  Lung cancer.  Lung disease, such as pneumonia, bronchitis, and emphysema.  Chest pain (angina) and heart attack because the heart is  not getting enough oxygen.  Heart disease and peripheral blood vessel disease.  High blood pressure (hypertension).  Stroke.  Oral cancer, including cancer of the lip, mouth, or voice box.  Bladder cancer.  Pancreatic cancer.  Cervical cancer.  Pregnancy complications, including premature birth.  Stillbirths and smaller newborn babies, birth defects, and genetic damage to sperm.  Early menopause.  Lower estrogen level for women.  Infertility.  Facial wrinkles.  Blindness.  Increased risk of broken bones (fractures).  Senile dementia.  Stomach ulcers and internal bleeding.  Delayed wound healing and increased risk of complications during surgery.  Even smoking lightly shortens your life expectancy by several years. Because of secondhand smoke exposure, children of smokers have an increased risk of the following:  Sudden infant death syndrome (SIDS).  Respiratory infections.  Lung cancer.  Heart disease.  Ear infections. What are the benefits of quitting? There are many health benefits of quitting smoking. Here are some of them:  Within days of quitting smoking, your risk of having a heart attack decreases, your blood flow improves, and your lung capacity improves. Blood pressure, pulse  rate, and breathing patterns start returning to normal soon after quitting.  Within months, your lungs may clear up completely.  Quitting for 10 years reduces your risk of developing lung cancer and heart disease to almost that of a nonsmoker.  People who quit may see an improvement in their overall quality of life. How do I quit smoking?     Smoking is an addiction with both physical and psychological effects, and longtime habits can be hard to change. Your health care provider can recommend:  Programs and community resources, which may include group support, education, or talk therapy.  Prescription medicines to help reduce cravings.  Nicotine replacement products,  such as patches, gum, and nasal sprays. Use these products only as directed. Do not replace cigarette smoking with electronic cigarettes, which are commonly called e-cigarettes. The safety of e-cigarettes is not known, and some may contain harmful chemicals.  A combination of two or more of these methods. Where to find more information  American Lung Association: www.lung.org  American Cancer Society: www.cancer.org Summary  Smoking cigarettes is very bad for your health. Cigarette smokers have an increased risk of many serious medical problems, including several cancers, heart disease, and stroke.  Smoking is an addiction with both physical and psychological effects, and longtime habits can be hard to change.  By stopping right away, you can greatly reduce the risk of medical problems for you and your family.  To help you quit smoking, your health care provider can recommend programs, community resources, prescription medicines, and nicotine replacement products such as patches, gum, and nasal sprays. This information is not intended to replace advice given to you by your health care provider. Make sure you discuss any questions you have with your health care provider. Document Revised: 07/23/2017 Document Reviewed: 04/25/2016 Elsevier Patient Education  2020 [place] 65 Years and Older, Male Preventive care refers to lifestyle choices and visits with your health care provider that can promote health and wellness. This includes:  A yearly physical exam. This is also called an annual well check.  Regular dental and eye exams.  Immunizations.  Screening for certain conditions.  Healthy lifestyle choices, such as diet and exercise. What can I expect for my preventive care visit? Physical exam Your health care provider will check:  Height and weight. These may be used to calculate body mass index (BMI), which is a measurement that tells if you are at a healthy  weight.  Heart rate and blood pressure.  Your skin for abnormal spots. Counseling Your health care provider may ask you questions about:  Alcohol, tobacco, and drug use.  Emotional well-being.  Home and relationship well-being.  Sexual activity.  Eating habits.  History of falls.  Memory and ability to understand (cognition).  Work and work Statistician. What immunizations do I need?  Influenza (flu) vaccine  This is recommended every year. Tetanus, diphtheria, and pertussis (Tdap) vaccine  You may need a Td booster every 10 years. Varicella (chickenpox) vaccine  You may need this vaccine if you have not already been vaccinated. Zoster (shingles) vaccine  You may need this after age 41. Pneumococcal conjugate (PCV13) vaccine  One dose is recommended after age 31. Pneumococcal polysaccharide (PPSV23) vaccine  One dose is recommended after age 20. Measles, mumps, and rubella (MMR) vaccine  You may need at least one dose of MMR if you were born in 1957 or later. You may also need a second dose. Meningococcal conjugate (MenACWY) vaccine  You  may need this if you have certain conditions. Hepatitis A vaccine  You may need this if you have certain conditions or if you travel or work in places where you may be exposed to hepatitis A. Hepatitis B vaccine  You may need this if you have certain conditions or if you travel or work in places where you may be exposed to hepatitis B. Haemophilus influenzae type b (Hib) vaccine  You may need this if you have certain conditions. You may receive vaccines as individual doses or as more than one vaccine together in one shot (combination vaccines). Talk with your health care provider about the risks and benefits of combination vaccines. What tests do I need? Blood tests  Lipid and cholesterol levels. These may be checked every 5 years, or more frequently depending on your overall health.  Hepatitis C test.  Hepatitis B  test. Screening  Lung cancer screening. You may have this screening every year starting at age 17 if you have a 30-pack-year history of smoking and currently smoke or have quit within the past 15 years.  Colorectal cancer screening. All adults should have this screening starting at age 46 and continuing until age 86. Your health care provider may recommend screening at age 64 if you are at increased risk. You will have tests every 1-10 years, depending on your results and the type of screening test.  Prostate cancer screening. Recommendations will vary depending on your family history and other risks.  Diabetes screening. This is done by checking your blood sugar (glucose) after you have not eaten for a while (fasting). You may have this done every 1-3 years.  Abdominal aortic aneurysm (AAA) screening. You may need this if you are a current or former smoker.  Sexually transmitted disease (STD) testing. Follow these instructions at home: Eating and drinking  Eat a diet that includes fresh fruits and vegetables, whole grains, lean protein, and low-fat dairy products. Limit your intake of foods with high amounts of sugar, saturated fats, and salt.  Take vitamin and mineral supplements as recommended by your health care provider.  Do not drink alcohol if your health care provider tells you not to drink.  If you drink alcohol: ? Limit how much you have to 0-2 drinks a day. ? Be aware of how much alcohol is in your drink. In the [place] one drink equals one 12 oz bottle of beer (355 mL), one 5 oz glass of wine (148 mL), or one 1 oz glass of hard liquor (44 mL). Lifestyle  Take daily care of your teeth and gums.  Stay active. Exercise for at least 30 minutes on 5 or more days each week.  Do not use any products that contain nicotine or tobacco, such as cigarettes, e-cigarettes, and chewing tobacco. If you need help quitting, ask your health care provider.  If you are sexually active,  practice safe sex. Use a condom or other form of protection to prevent STIs (sexually transmitted infections).  Talk with your health care provider about taking a low-dose aspirin or statin. What's next?  Visit your health care provider once a year for a well check visit.  Ask your health care provider how often you should have your eyes and teeth checked.  Stay up to date on all vaccines. This information is not intended to replace advice given to you by your health care provider. Make sure you discuss any questions you have with your health care provider. Document Revised: 04/15/2018 Document Reviewed: 04/15/2018  Elsevier Patient Education  [employer].

## 2019-09-07 ENCOUNTER — Other Ambulatory Visit: Payer: Self-pay

## 2019-09-07 ENCOUNTER — Encounter: Payer: Self-pay | Admitting: Cardiovascular Disease

## 2019-09-07 ENCOUNTER — Ambulatory Visit (INDEPENDENT_AMBULATORY_CARE_PROVIDER_SITE_OTHER): Payer: Medicare Other | Admitting: Cardiovascular Disease

## 2019-09-07 VITALS — BP 184/74 | HR 56 | Ht 69.0 in | Wt 193.0 lb

## 2019-09-07 DIAGNOSIS — R0602 Shortness of breath: Secondary | ICD-10-CM | POA: Diagnosis not present

## 2019-09-07 DIAGNOSIS — R002 Palpitations: Secondary | ICD-10-CM | POA: Diagnosis not present

## 2019-09-07 DIAGNOSIS — R42 Dizziness and giddiness: Secondary | ICD-10-CM | POA: Diagnosis not present

## 2019-09-07 DIAGNOSIS — G4719 Other hypersomnia: Secondary | ICD-10-CM | POA: Diagnosis not present

## 2019-09-07 DIAGNOSIS — E785 Hyperlipidemia, unspecified: Secondary | ICD-10-CM

## 2019-09-07 DIAGNOSIS — I10 Essential (primary) hypertension: Secondary | ICD-10-CM | POA: Diagnosis not present

## 2019-09-07 DIAGNOSIS — I5189 Other ill-defined heart diseases: Secondary | ICD-10-CM

## 2019-09-07 DIAGNOSIS — I2510 Atherosclerotic heart disease of native coronary artery without angina pectoris: Secondary | ICD-10-CM | POA: Diagnosis not present

## 2019-09-07 DIAGNOSIS — I519 Heart disease, unspecified: Secondary | ICD-10-CM | POA: Diagnosis not present

## 2019-09-07 MED ORDER — LISINOPRIL 20 MG PO TABS: 20.0000 mg | ORAL_TABLET | Freq: Every day | ORAL | 3 refills | Status: DC

## 2019-09-07 NOTE — Progress Notes (Signed)
Patient ID: Dakota Gibbs, male   DOB: 10-07-1950, 69 y.o.   MRN: [id number]    PCP: Dr. Kathlene November  HPI: Dakota Gibbs is a 69 y.o. male who presents to the office today for a 7 month month cardiology evaluation.  Dakota Gibbs suffered an inferior wall myocardial infarction on 05/10/1995 and underwent acute intervention to a totally occluded RCA. He had more distal disease in the posterolateral vessel which was treated medically. Several days later he underwent staged intervention to circumflex marginal vessel. His last cardiac catheterization was in 2001 which did not show restenosis and actually showed coronary artery disease regression.  He has been aggressively treated since his initial event.  When I saw him several  years ago he had noticed several episodes in the early morning while sleeping that he develops nocturnal palpitations. Upon further questioning he does snore and his snoring is more significantly abnormal particularly after drinking alcohol. He wakes up one to 2 times per night.  He was started on Celexa for anxiety/depression which has helped.  He has a history of hyperlipidemia and has been tolerating Lipitor. He also has GERD, and hypertension.   Over the past year, he admits to a 25 pound weight loss. In contrast to his previous diet many years ago when he tried the [employer], his current diet is eating less along with few carbohydrates. His weight is reduced from 208-180 3 pounds. He is sleeping significantly improved. He is not aware of any snoring.  He continues to take amlodipine 5 mg, lisinopril 20, no grams, Toprol-XL 50 mg for hypertension and CAD.  He is on Zantac 150 mg for dyspepsia/GERD. He has more energy.  Lab work done by his primary physician had shown improvement in his hemoglobin A1c at 6.0, improved from 6.6.   I last saw him in March 2018.  At that time he continues to do well an was working in the executive recruiting business.  He denied chest  pain or shortness of breath.  At times he believes he may be having some short-term memory loss.    I saw him in the clinic on a DOD on Sep 06, 2018.  However, with the COVID-19 pandemic, he had not been going to the gym.  Over the past month, he has began to notice episodes of transient lightheadedness and dizziness which typically occur during physical exertion.  He denies any frank syncope.  He denies exertional chest pain or palpitations.  He has been noticing that his blood pressure has been in the 140 to 150 range.  He has continued to be on Toprol-XL 50 mg, amlodipine 5 mg, and lisinopril 20 mg.  He is on atorvastatin for hyperlipidemia.  He continues to be on Celexa.  He continues to be an executive recruiting but his work is significantly slowed down and have him most come to a complete halt during the [place] pandemic.    During that evaluation, his ECG revealed sinus bradycardia at 43 bpm.  Laboratory also had shown elevated potassium at 5.6.  I decreased lisinopril from 20 mg down to 10 mg and titrated amlodipine to 10 mg.  I provided him information regarding potassium and certain foods and fruits.  An echo Doppler study as well as carotid duplex imaging were recommended.  Repeat laboratory several days later showed a potassium of 4.9.  Patient did realize that he was having a fair amount of grapefruit juice and a Seabreeze drink which also contributed to his  potassium elevation.  His echo Doppler study showed an EF of 60 to 65% with grade 1 diastolic dysfunction.  There was minimal increased RV systolic pressure at [id number] mm.  There was mild right atrial dilatation.  His carotid study showed mild bilateral internal carotid plaque in the 1 to 39% range with greater than 50% ECA stenoses.  He had normal antegrade flow bilaterally in the vertebral arteries and his subclavian arteries were normal.  Since I last saw him, he has monitored his blood pressure and has average 40 readings.  He states his  blood pressure has run over 40 samples averaging 142/59.  His pulse is increased slightly now in the 50s with an average of 53.  He had undergone repeat laboratory which had shown his LDL cholesterol had risen to 85.  He denies any episodes of chest pain or shortness of breath.  However, he still notes occasional episodes of transient dizziness . He presents for evaluation  I last evaluated him in a telemedicine visit on Sep 20, 2018.  At that time his blood pressure had improved.  He was on amlodipine 10 mg, reduced dose of  lisinopril at 10 mg, and Toprol-XL 25 mg.  He continued to have some episodes of dizziness and bradycardia and during that evaluation recommended reduction of Toprol-XL to 12.5 mg daily.  I reviewed his echo Doppler data as well as his carotid duplex imaging studies.  He had normal bilateral antegrade flow to his vertebrals and he did not have any high-grade carotid stenoses.  With his LDL cholesterol of 85 on atorvastatin 40 mg I added Zetia 10 mg.  Repeat potassium improved to 4.9 with discontinuance of his frequent grapefruit juice.  I recommended that he undergo an event monitor for further evaluation of potential arrhythmia.  He was monitored from June 30 through November 15, 2018.  The predominant rhythm was sinus rhythm with the fastest episode of sinus tachycardia at 110 bpm at 8:28 PM on July 4.  His slowest episode was sinus bradycardia at 46 bpm while sleeping at 3:50 AM on July 8.  There were occasional PVCs representing 1% of his abnormal rhythm.  There was 1 episode of 4 beats of multifocal ventricular tachycardia on July 3.  Otherwise PVCs were isolated.  There were no episodes of atrial fibrillation.  There were no pauses.   I saw him in August 2020 at which time his syncope had improved.  Presyncope has improved.  However he does note that he still feels episodes of some lightheadedness.  Today he planted for plants in the heat.  He also cuts rubs.  He denied any definitive  orthostatic symptoms.  He walked a mile today without chest pain or dizziness.    He was last evaluated in a telemedicine visit on March 03, 2019.  At that time his symptoms had dramatically improved.  He was no longer having significant shortness of breath with walking or chest pressure.  He still noted mild fatigability.  He was unaware of palpitations.  Over the past several months, his biggest complaint today is that he is tired all the time.  He notes shortness of breath and fatigue particularly while walking in his garden.  He still able to walk a mile 3 to 4 days/week.  He is back at work in his executive recruiting business.  He does note some early morning palpitations typically around 5 AM.  Recently his blood pressure has been elevated and may reach 0000000 systolically.  He presents for reevaluation.   Past Medical History:  Diagnosis Date  . Allergy    enviornmental  . Anxiety   . Anxiety and depression 10/25/2012  . Back pain    L4-L5 bulging disc, L5-S1 - bulging disc, pinched nerve in neck  . CAD (coronary artery disease)      Dr Claiborne Billings, s/p stents----- sees cards yearly   . Cancer ([place])    basal cell; carcinoma removed x3 and SCC  . Depression   . Diabetes mellitus    pt denies  . GERD (gastroesophageal reflux disease)   . History of shingles 2009  . Myocardial infarction ([place]) 1997    Past Surgical History:  Procedure Laterality Date  . ANGIOPLASTY  1997  . basel cell    . CARDIAC CATHETERIZATION     10/04/1999  . DOPPLER ECHOCARDIOGRAPHY  07/09/2006   EF 50 to 55 %, LA mildy dilated  . HERNIA REPAIR  1951   RIGHT  . KNEE ARTHROSCOPY  2001   R  . NM MYOCAR PERF WALL MOTION  08/22/2011   Mets 13,low risk study  . WRIST SURGERY  1984   right    No Known Allergies  Current Outpatient Medications  Medication Sig Dispense Refill  . amLODipine (NORVASC) 5 MG tablet TAKE 2 TABLETS EVERY DAY 180 tablet 2  . aspirin EC 81 MG tablet Take 1 tablet (81 mg total) by  mouth daily. 90 tablet 3  . atorvastatin (LIPITOR) 40 MG tablet TAKE 1 TABLET EVERY DAY AT 6 PM 90 tablet 3  . azelastine (ASTELIN) 0.1 % nasal spray Place 2 sprays into both nostrils 2 (two) times daily as needed for rhinitis or allergies. Use in each nostril as directed 90 mL 3  . cetirizine (ZYRTEC) 10 MG tablet Take 10 mg by mouth daily.    . citalopram (CELEXA) 20 MG tablet Take 1.5 tablets (30 mg total) by mouth daily. 135 tablet 0  . ezetimibe (ZETIA) 10 MG tablet TAKE 1 TABLET EVERY DAY 90 tablet 2  . famotidine (PEPCID) 20 MG tablet Take 20 mg by mouth daily.    . hydrochlorothiazide (MICROZIDE) 12.5 MG capsule TAKE 1 CAPSULE EVERY DAY 90 capsule 2  . lisinopril (ZESTRIL) 20 MG tablet Take 1 tablet (20 mg total) by mouth daily. 90 tablet 3  . Lutein 20 MG TABS Take 20 mg by mouth daily.    . metoprolol succinate (TOPROL-XL) 25 MG 24 hr tablet Take 0.5 tablets (12.5 mg total) by mouth daily. Take with or immediately following a meal. 90 tablet 1  . Multiple Vitamin (MULTIVITAMIN) tablet Take 1 tablet by mouth daily.      . nitroGLYCERIN (NITROSTAT) 0.4 MG SL tablet Place 1 tablet (0.4 mg total) under the tongue every 5 (five) minutes as needed for chest pain. 25 tablet 1  . sildenafil (VIAGRA) 100 MG tablet Take 100 mg by mouth daily as needed.      . vitamin C (ASCORBIC ACID) 500 MG tablet Take 500 mg by mouth daily.      . vitamin E 400 UNIT capsule Take 400 Units by mouth daily.       No current facility-administered medications for this visit.    Social History   Socioeconomic History  . Marital status: Married    Spouse name: Not on file  . Number of children: 2  . Years of education: Not on file  . Highest education level: Not on file  Occupational History  .  Occupation: Engineer, manufacturing   Tobacco Use  . Smoking status: Light Tobacco Smoker    Types: Cigars    Last attempt to quit: 03/19/1992    Years since quitting: 27.4  . Smokeless tobacco: Never Used  Substance  and Sexual Activity  . Alcohol use: Yes    Comment:  3 glasses wine / night  . Drug use: No  . Sexual activity: Not on file  Other Topics Concern  . Not on file  Social History Narrative   Lives w/ wife            Social Determinants of Health   Financial Resource Strain:   . Difficulty of Paying Living Expenses:   Food Insecurity:   . Worried About Charity fundraiser in the Last Year:   . Arboriculturist in the Last Year:   Transportation Needs:   . Film/video editor (Medical):   Marland Kitchen Lack of Transportation (Non-Medical):   Physical Activity:   . Days of Exercise per Week:   . Minutes of Exercise per Session:   Stress:   . Feeling of Stress :   Social Connections:   . Frequency of Communication with Friends and Family:   . Frequency of Social Gatherings with Friends and Family:   . Attends Religious Services:   . Active Member of Clubs or Organizations:   . Attends Archivist Meetings:   Marland Kitchen Marital Status:   Intimate Partner Violence:   . Fear of Current or Ex-Partner:   . Emotionally Abused:   Marland Kitchen Physically Abused:   . Sexually Abused:    Social history is notable in that he is married and has 2 children. He now has a dog and walks the dog 1 mile 2 times a day. He is no longer using his treadmill. There is no tobacco use. He does drink alcohol.  Family History  Problem Relation Age of Onset  . Stroke Mother        TIA  . COPD Mother   . Hypertension Sister   . Prostate cancer Father        Father and brother   . Coronary artery disease Father        GM  . Prostate cancer Brother   . Diabetes Brother        B, GM, other fami;ly members   . Colon cancer Neg Hx   . Esophageal cancer Neg Hx   . Colon polyps Neg Hx   . Rectal cancer Neg Hx   . Stomach cancer Neg Hx     ROS General: Negative; No fevers, chills, or night sweats;  Positive for previous purposeful weight loss of 25 pounds. HEENT: Negative; No changes in vision or hearing, sinus  congestion, difficulty swallowing Pulmonary: Negative; No cough, wheezing, shortness of breath, hemoptysis Cardiovascular: See HPI GI: Negative; No nausea, vomiting, diarrhea, or abdominal pain GU: Mild erectile dysfunction; No dysuria, hematuria, or difficulty voiding Musculoskeletal: Negative; no myalgias, joint pain, or weakness Hematologic/Oncology: Negative; no easy bruising, bleeding Endocrine: Negative; no heat/cold intolerance; no diabetes Neuro: Mild short-term memory loss; no changes in balance, headaches Skin: Negative; No rashes or skin lesions Psychiatric: Mild depression/anxiety Sleep: Positive for fatigability and tired all the time.  Positive for mild snoring which has significantly improved with his weight loss.  no bruxism, restless legs, hypnogognic hallucinations, no cataplexy  An Epworth Sleepiness Scale score was calculated in the office today and this endorsed at 11 consistent with excessive  daytime sleepiness.  Other comprehensive 14 point system review is negative.   PE BP (!) 184/74   Pulse (!) 56   Ht 5\' 9"  (1.753 m)   Wt 193 lb (87.5 kg)   SpO2 97%   BMI 28.50 kg/m    Repeat blood pressure by me was 162/76.  Wt Readings from Last 3 Encounters:  09/07/19 193 lb (87.5 kg)  07/22/19 192 lb 6.4 oz (87.3 kg)  03/03/19 190 lb (86.2 kg)   General: Alert, oriented, no distress.  Skin: normal turgor, no rashes, warm and dry HEENT: Normocephalic, atraumatic. Pupils equal round and reactive to light; sclera anicteric; extraocular muscles intact;  Nose without nasal septal hypertrophy Mouth/Parynx benign; Mallinpatti scale 3 Neck: No JVD, no carotid bruits; normal carotid upstroke Lungs: clear to ausculatation and percussion; no wheezing or rales Chest wall: without tenderness to palpitation Heart: PMI not displaced, RRR, s1 s2 normal, 1/6 systolic murmur, no diastolic murmur, no rubs, gallops, thrills, or heaves Abdomen: soft, nontender; no  hepatosplenomehaly, BS+; abdominal aorta nontender and not dilated by palpation. Back: no CVA tenderness Pulses 2+ Musculoskeletal: full range of motion, normal strength, no joint deformities Extremities: no clubbing cyanosis or edema, Homan's sign negative  Neurologic: grossly nonfocal; Cranial nerves grossly wnl Psychologic: Normal mood and affect   ECG (independently read by me): Sinus bradycardia 56 bpm.  No ectopy.  QTc interval 467 ms  August 2020 ECG (independently read by me): Normal sinus rhythm at 61 bpm.  Sep 06, 2018 ECG (independently read by me): Marked sinus bradycardia at 43 bpm.  No ST segment changes.  PR interval 148 ms, QTc interval 419 ms.  March 2019 ECG (independently read by me): sinus bradycardia 51 bpm.  No ectopy.  Normal intervals.  March 2018 ECG (independently read by me): Sinus bradycardia 52 bpm.  Small nondiagnostic inferior Q waves.  Normal intervals  November 2016 ECG (independently read by me):  Sinus bradycardia at 49 bpm.  Small inferior Q waves with preserved R waves.  February 2016 ECG (independently read by me): Sinus bradycardia 46 bpm.  No ectopy.  February 2015 ECG (independently read by me): Normal sinus rhythm at 62 beats per minute. Small nondiagnostic inferior Q waves. Observed R waves.  LABS:  BMP Latest Ref Rng & Units 07/22/2019 11/24/2018 09/03/2018  Glucose 70 - 99 mg/dL 105(H) 111(H) 119(H)  BUN 6 - 23 mg/dL 14 17 14   Creatinine 0.40 - 1.50 mg/dL 0.86 0.76 0.87  BUN/Creat Ratio 10 - 24 - 22 16  Sodium 135 - 145 mEq/L 137 139 139  Potassium 3.5 - 5.1 mEq/L 4.4 4.9 4.9  Chloride 96 - 112 mEq/L 100 99 103  CO2 19 - 32 mEq/L 29 24 22   Calcium 8.4 - 10.5 mg/dL 9.2 9.2 9.0      Component Value Date/Time   PROT 6.9 07/22/2019 1014   PROT 6.4 11/24/2018 0843   ALBUMIN 4.0 07/22/2019 1014   ALBUMIN 4.2 11/24/2018 0843   AST 17 07/22/2019 1014   ALT 16 07/22/2019 1014   ALKPHOS 86 07/22/2019 1014   BILITOT 1.0 07/22/2019 1014    BILITOT 0.6 11/24/2018 0843    CBC Latest Ref Rng & Units 07/22/2019 08/30/2018 02/05/2016  WBC 4.0 - 10.5 K/uL 7.4 6.3 7.4  Hemoglobin 13.0 - 17.0 g/dL 14.2 13.1 13.5  Hematocrit 39.0 - 52.0 % 41.9 39.4 39.4  Platelets 150.0 - 400.0 K/uL 246.0 253 236.0   Lab Results  Component Value Date   MCV  94.1 07/22/2019   MCV 98 (H) 08/30/2018   MCV 93.0 02/05/2016   Lab Results  Component Value Date   TSH 2.320 08/30/2018   Lab Results  Component Value Date   HGBA1C 6.0 07/22/2019    BNP No results found for: PROBNP  Lipid Panel     Component Value Date/Time   CHOL 160 07/22/2019 1014   CHOL 150 11/24/2018 0843   TRIG 146.0 07/22/2019 1014   HDL 67.90 07/22/2019 1014   HDL 77 11/24/2018 0843   CHOLHDL 2 07/22/2019 1014   VLDL 29.2 07/22/2019 1014   LDLCALC 63 07/22/2019 1014   LDLCALC 55 11/24/2018 0843    RADIOLOGY: No results found.  IMPRESSION:  1. CAD in native artery   2. Shortness of breath   3. Essential hypertension   4. Hyperlipidemia with target LDL less than 70   5. Excessive daytime sleepiness   6. Palpitations   7. Dizziness   8. Grade I diastolic dysfunction     ASSESSMENT AND PLAN:  Mr. Nail is a 69 year old Caucasian male who is 24 years status post his inferior wall myocardial infarction at which time he underwent acute intervention to a totally occluded RCA and staged intervention to circumflex marginal vessel in January 1997. His last catheterization in 2001 showed plaque regression on his aggressive medical regimen.  He has been aggressively treated and has remained stable without recurrent anginal symptoms.  A Holter monitor obtained in September 2020 revealed predominant sinus rhythm with occasional isolated PVCs although there was one 4 beat episode of multifocal ventricular tachycardia.  His beta-blocker dose had been reduced because of bradycardia with heart rates in the 40s.  An echo Doppler studies demonstrated normal systolic function  with EF of 60 to 65%.  His major complaint today is that of fatigability and being tired all the time.  He does note shortness of breath and fatigue particularly while working in his yard.  An Epworth Sleepiness Scale score suggested excessive daytime sleepiness.  His blood pressure today is elevated.  I am recommending dose titration of his lisinopril from 10 mg up to 20 mg daily.  He will continue hydrochlorothiazide 12.5 mg daily in addition to amlodipine 10 mg for blood pressure control.  He continues to be on atorvastatin 40 mg and Zetia 10 mg for hyperlipidemia.  Laboratory in March 2021 showed an LDL at 81.  With his significant fatigability and daytime sleepiness I have suggested he undergo a sleep study for evaluation of obstructive sleep apnea.  I will see him in the office in approximately 3 months for follow-up evaluation.  Troy Sine, MD, [facility]  09/09/2019 8:46 PM

## 2019-09-07 NOTE — Progress Notes (Deleted)
Cardiology Clinic Note   Patient Name: [name] Date of Encounter: 09/07/2019  Primary Care Provider:  Libby Maw, MD Primary Cardiologist:  Shelva Majestic, MD  Patient Profile    Dakota Gibbs 69 year old male presents today for follow-up of his presyncope.  Past Medical History    Past Medical History:  Diagnosis Date  . Allergy    enviornmental  . Anxiety   . Anxiety and depression 10/25/2012  . Back pain    L4-L5 bulging disc, L5-S1 - bulging disc, pinched nerve in neck  . CAD (coronary artery disease)      Dr Claiborne Billings, s/p stents----- sees cards yearly   . Cancer ([place])    basal cell; carcinoma removed x3 and SCC  . Depression   . Diabetes mellitus    pt denies  . GERD (gastroesophageal reflux disease)   . History of shingles 2009  . Myocardial infarction ([place]) 1997   Past Surgical History:  Procedure Laterality Date  . ANGIOPLASTY  1997  . basel cell    . CARDIAC CATHETERIZATION     10/04/1999  . DOPPLER ECHOCARDIOGRAPHY  07/09/2006   EF 50 to 55 %, LA mildy dilated  . HERNIA REPAIR  1951   RIGHT  . KNEE ARTHROSCOPY  2001   R  . NM MYOCAR PERF WALL MOTION  08/22/2011   Mets 13,low risk study  . WRIST SURGERY  1984   right    Allergies  No Known Allergies  History of Present Illness    [name] was last seen by Dr. Claiborne Billings on 12/08/2018.  During that time he was having fewer episodes of presyncope and able to perform yard work and plant his flower garden.  He was unsure during his last visit if he was still taking 25 mg of metoprolol or if he was taking 12.5 mg.  Metoprolol 12.5 was prescribed and he follows up today.  In January 1997 he underwent cardiac catheterization for an occluded RCA.  The procedure was staged for a circumflex marginal vessel.  His last cardiac catheterization in 2001 showed plaque regression on his medical management therapy.  His prior EKGs also have not shown any evidence of prior infarction.  His  echocardiogram on 09/06/2018 showed an [name] of 60 to 65%.  Impaired diastolic relaxation.  Right ventricular systolic pressure of 32. 06 mmHg.  Right atria mildly dilated.  PMH also includes sinus bradycardia, GERD, diabetes mellitus, DJD, anxiety and depression, hyperlipidemia, and palpitations.  He presents to the clinic today and states he has been taking 12.5 metoprolol and does not feel tired or lethargic unless he is trying to be physically active.  He states he continues to have increased shortness of breath with increased physical activity, diaphoresis with increased physical activity, and fatigue after short bouts of physical exertion.  He also states he has mild substernal chest discomfort with exertion that subsides when he discontinues activity.  He has not taken any nitroglycerin to help relieve his chest pressure.  He denies chest pain, palpitations, melena, hematuria, hemoptysis, diaphoresis, weakness, presyncope, syncope, orthopnea, and PND.   Home Medications    Prior to Admission medications   Medication Sig Start Date End Date Taking? Authorizing Provider  amLODipine (NORVASC) 5 MG tablet Take 2 tablets (10 mg total) by mouth daily. 10/11/18   Troy Sine, MD  aspirin EC 81 MG tablet Take 1 tablet (81 mg total) by mouth daily. 06/27/14   Troy Sine, MD  atorvastatin (  LIPITOR) 40 MG tablet TAKE 1 TABLET EVERY DAY AT 6 PM 12/14/18   Troy Sine, MD  azelastine (ASTELIN) 0.1 % nasal spray Place 2 sprays into both nostrils 2 (two) times daily as needed for rhinitis or allergies. Use in each nostril as directed 07/28/17   Colon Branch, MD  cetirizine (ZYRTEC) 10 MG tablet Take 10 mg by mouth daily.    [provider]  citalopram (CELEXA) 20 MG tablet Take by mouth daily. PT TAKES 30 MG DAILY    [provider]  ezetimibe (ZETIA) 10 MG tablet Take 1 tablet (10 mg total) by mouth daily. 10/12/18 01/10/19  Troy Sine, MD  famotidine (PEPCID) 20 MG tablet Take 20  mg by mouth daily.    [provider]  hydrochlorothiazide (MICROZIDE) 12.5 MG capsule Take 1 capsule (12.5 mg total) by mouth daily. 10/12/18 01/10/19  Troy Sine, MD  lisinopril (ZESTRIL) 20 MG tablet Take 0.5 tablets (10 mg total) by mouth daily. 10/12/18   Troy Sine, MD  Lutein 20 MG TABS Take 20 mg by mouth daily.    [provider]  metoprolol succinate (TOPROL-XL) 25 MG 24 hr tablet Take 0.5 tablets (12.5 mg total) by mouth daily. Take with or immediately following a meal. 10/12/18   Troy Sine, MD  metoprolol tartrate (LOPRESSOR) 25 MG tablet Take 0.5-1 tablets (12.5-25 mg total) by mouth daily as needed (heart palpitations). 12/15/18   Troy Sine, MD  Multiple Vitamin (MULTIVITAMIN) tablet Take 1 tablet by mouth daily.      [provider]  nitroGLYCERIN (NITROSTAT) 0.4 MG SL tablet Place 1 tablet (0.4 mg total) under the tongue every 5 (five) minutes as needed for chest pain. 07/24/16   Troy Sine, MD  sildenafil (VIAGRA) 100 MG tablet Take 100 mg by mouth daily as needed.      [provider]  vitamin C (ASCORBIC ACID) 500 MG tablet Take 500 mg by mouth daily.      [provider]  vitamin E 400 UNIT capsule Take 400 Units by mouth daily.      [provider]    Family History    Family History  Problem Relation Age of Onset  . Stroke Mother        TIA  . COPD Mother   . Hypertension Sister   . Prostate cancer Father        Father and brother   . Coronary artery disease Father        GM  . Prostate cancer Brother   . Diabetes Brother        B, GM, other fami;ly members   . Colon cancer Neg Hx   . Esophageal cancer Neg Hx   . Colon polyps Neg Hx   . Rectal cancer Neg Hx   . Stomach cancer Neg Hx    He indicated that his mother is deceased. He indicated that his father is deceased. He indicated that both of his sisters are alive. He indicated that two of his three brothers are alive. He indicated that the  status of his neg hx is unknown.  Social History    Social History   Socioeconomic History  . Marital status: Married    Spouse name: Not on file  . Number of children: 2  . Years of education: Not on file  . Highest education level: Not on file  Occupational History  . Occupation: Engineer, manufacturing  Tobacco Use  . Smoking status: Light Tobacco Smoker    Types: Cigars    Last attempt to quit: 03/19/1992    Years since quitting: 27.4  . Smokeless tobacco: Never Used  Substance and Sexual Activity  . Alcohol use: Yes    Comment:  3 glasses wine / night  . Drug use: No  . Sexual activity: Not on file  Other Topics Concern  . Not on file  Social History Narrative   Lives w/ wife            Social Determinants of Health   Financial Resource Strain:   . Difficulty of Paying Living Expenses:   Food Insecurity:   . Worried About Charity fundraiser in the Last Year:   . Arboriculturist in the Last Year:   Transportation Needs:   . Film/video editor (Medical):   Marland Kitchen Lack of Transportation (Non-Medical):   Physical Activity:   . Days of Exercise per Week:   . Minutes of Exercise per Session:   Stress:   . Feeling of Stress :   Social Connections:   . Frequency of Communication with Friends and Family:   . Frequency of Social Gatherings with Friends and Family:   . Attends Religious Services:   . Active Member of Clubs or Organizations:   . Attends Archivist Meetings:   Marland Kitchen Marital Status:   Intimate Partner Violence:   . Fear of Current or Ex-Partner:   . Emotionally Abused:   Marland Kitchen Physically Abused:   . Sexually Abused:      Review of Systems    General:  No chills, fever, night sweats or weight changes.  Cardiovascular:  No chest pain, increased dyspnea on exertion, edema, orthopnea, palpitations, paroxysmal nocturnal dyspnea. Dermatological: No rash, lesions/masses Respiratory: No cough, dyspnea Urologic: No hematuria, dysuria Abdominal:   No  nausea, vomiting, diarrhea, bright red blood per rectum, melena, or hematemesis Neurologic:  No visual changes, wkns, changes in mental status. All other systems reviewed and are otherwise negative except as noted above.  Physical Exam    VS:  BP (!) 184/74   Pulse (!) 56   Ht 5\' 9"  (1.753 m)   Wt 193 lb (87.5 kg)   SpO2 97%   BMI 28.50 kg/m  , BMI Body mass index is 28.5 kg/m. GEN: Well nourished, well developed, in no acute distress. HEENT: normal. Neck: Supple, no JVD, carotid bruits, or masses. Cardiac: RRR, no murmurs, rubs, or gallops. No clubbing, cyanosis, edema.  Radials/DP/PT 2+ and equal bilaterally.  Respiratory:  Respirations regular and unlabored, clear to auscultation bilaterally. GI: Soft, nontender, nondistended, BS + x 4. MS: no deformity or atrophy. Skin: warm and dry, no rash. Neuro:  Strength and sensation are intact. Psych: Normal affect.  Accessory Clinical Findings    ECG personally reviewed by me today-sinus bradycardia with occasional premature ventricular complexes 56 bpm- No acute changes  Echocardiogram 09/06/2018  1. The left ventricle has normal systolic function with an ejection fraction of 60-65%. The cavity size was normal. Left ventricular diastolic Doppler parameters are consistent with impaired relaxation. No evidence of left ventricular regional wall  motion abnormalities.  2. The right ventricle has normal systolic function. The cavity was normal. There is no increase in right ventricular wall thickness. Right ventricular systolic pressure is normal with an estimated pressure of 32.6 mmHg.  3. Right atrial size was mildly dilated.  Assessment & Plan     Coronary  artery disease- experiencing chest discomfort with increased physical activity, increased shortness of breath, diaphoresis and increased fatigue.  Last cardiac catheterization 2001 showed plaque regression on his aggressive medical regimen.  January 1997 inferior wall MI acute  intervention of a totally occluded RCA and staged intervention for his circumflex marginal vessel. Order nuclear stress test Continue 81 mg aspirin daily Continue Lipitor 40 mg tablet daily Continue Zetia 10 mg tablet daily Continue nitroglycerin 0.4 mg tablet as needed Increase physical activity as tolerated Heart healthy low-sodium diet-salty 6 given  Shortness of breath-experiencing chest discomfort with increased physical activity, increased shortness of breath, diaphoresis and increased fatigue.  Last cardiac catheterization 2001 showed plaque regression on his aggressive medical regimen.  January 1997 inferior wall MI acute intervention of a totally occluded RCA and staged intervention for his circumflex marginal vessel. Order nuclear stress test Continue 81 mg aspirin daily Continue Lipitor 40 mg tablet daily Continue Zetia 10 mg tablet daily Continue nitroglycerin 0.4 mg tablet as needed   Essential hypertension-BP today 120/56.  Patient brings wrist blood pressure cuff that we checked with the manual blood pressure cuff.  It was reading about 10-12 points higher systolic.  We talked about keeping blood pressure log and bring it to his next visit as well as taking blood pressure with both feet flat on the floor sitting in a chair and having arm at chest height. Continue amlodipine 10 mg tablet daily Continue hydrochlorothiazide 12.5 mg tablet daily Continue lisinopril 10 mg daily Continue metoprolol succinate 12.5 mg daily   Dizziness- cardiac event monitor showed predominantly sinus rhythm with occasional isolated PVCs and 4 beat episode of multifocal ventricular tachycardia.  No further episodes of dizziness   Hyperlipidemia-07/22/2019: Cholesterol 160; HDL 67.90; LDL Cholesterol 63; Triglycerides 146.0; VLDL 29.2 Continue atorvastatin 40 mg tablet daily  Disposition: Follow-up after nuclear stress test with APP  Shelva Majestic, NP-C 09/07/2019, 4:49 PM

## 2019-09-07 NOTE — Patient Instructions (Signed)
Medication Instructions:  INCREASE LISINOPRIL TO 20MG  DAILY *If you need a refill on your cardiac medications before your next appointment, please call your pharmacy*  Testing/Procedures: Your physician has recommended that you have a sleep study. This test records several body functions during sleep, including: brain activity, eye movement, oxygen and carbon dioxide blood levels, heart rate and rhythm, breathing rate and rhythm, the flow of air through your mouth and nose, snoring, body muscle movements, and chest and belly movement.     Follow-Up: At [facility], you and your health needs are our priority.  As part of our continuing mission to provide you with exceptional heart care, we have created designated Provider Care Teams.  These Care Teams include your primary Cardiologist (physician) and Advanced Practice Providers (APPs -  Physician Assistants and Nurse Practitioners) who all work together to provide you with the care you need, when you need it.  We recommend signing up for the patient portal called "MyChart".  Sign up information is provided on this After Visit Summary.  MyChart is used to connect with patients for Virtual Visits (Telemedicine).  Patients are able to view lab/test results, encounter notes, upcoming appointments, etc.  Non-urgent messages can be sent to your provider as well.   To learn more about what you can do with MyChart, go to [email or web address].    Your next appointment:   3-4 month(s)  The format for your next appointment:   In Person  Provider:   Shelva Majestic, MD

## 2019-09-09 ENCOUNTER — Encounter: Payer: Self-pay | Admitting: Cardiovascular Disease

## 2019-09-13 ENCOUNTER — Other Ambulatory Visit: Payer: Self-pay

## 2019-09-13 MED ORDER — CITALOPRAM HYDROBROMIDE 20 MG PO TABS: 30.0000 mg | ORAL_TABLET | Freq: Every day | ORAL | 0 refills | Status: DC

## 2019-09-13 NOTE — Telephone Encounter (Signed)
Refill request for pending medication. Last OV 07/20/19 patient would like sent to [facility]. Please advise.

## 2019-10-10 ENCOUNTER — Encounter (HOSPITAL_BASED_OUTPATIENT_CLINIC_OR_DEPARTMENT_OTHER): Payer: Self-pay

## 2019-10-13 ENCOUNTER — Telehealth: Payer: Self-pay | Admitting: *Deleted

## 2019-10-13 NOTE — Telephone Encounter (Signed)
Mychart message sent to patient about sleep study appointment.

## 2019-10-15 ENCOUNTER — Other Ambulatory Visit (HOSPITAL_COMMUNITY)
Admission: RE | Admit: 2019-10-15 | Discharge: 2019-10-15 | Disposition: A | Discharge status: Home / Self Care | Payer: Medicare Other | Source: Ambulatory Visit | Attending: Cardiovascular Disease | Admitting: Cardiovascular Disease

## 2019-10-15 DIAGNOSIS — Z01812 Encounter for preprocedural laboratory examination: Secondary | ICD-10-CM | POA: Diagnosis not present

## 2019-10-15 DIAGNOSIS — Z20822 Contact with and (suspected) exposure to covid-19: Secondary | ICD-10-CM | POA: Diagnosis not present

## 2019-10-15 LAB — SARS CORONAVIRUS 2 (TAT 6-24 HRS): SARS Coronavirus 2: NEGATIVE

## 2019-10-17 ENCOUNTER — Other Ambulatory Visit: Payer: Self-pay

## 2019-10-17 ENCOUNTER — Ambulatory Visit (HOSPITAL_BASED_OUTPATIENT_CLINIC_OR_DEPARTMENT_OTHER): Payer: Medicare Other | Attending: Cardiovascular Disease | Admitting: Cardiovascular Disease

## 2019-10-17 DIAGNOSIS — Z79899 Other long term (current) drug therapy: Secondary | ICD-10-CM | POA: Diagnosis not present

## 2019-10-17 DIAGNOSIS — E119 Type 2 diabetes mellitus without complications: Secondary | ICD-10-CM | POA: Insufficient documentation

## 2019-10-17 DIAGNOSIS — I2510 Atherosclerotic heart disease of native coronary artery without angina pectoris: Secondary | ICD-10-CM | POA: Insufficient documentation

## 2019-10-17 DIAGNOSIS — I10 Essential (primary) hypertension: Secondary | ICD-10-CM

## 2019-10-17 DIAGNOSIS — R0902 Hypoxemia: Secondary | ICD-10-CM | POA: Diagnosis not present

## 2019-10-17 DIAGNOSIS — Z7982 Long term (current) use of aspirin: Secondary | ICD-10-CM | POA: Diagnosis not present

## 2019-10-17 DIAGNOSIS — E785 Hyperlipidemia, unspecified: Secondary | ICD-10-CM

## 2019-10-17 DIAGNOSIS — R0683 Snoring: Secondary | ICD-10-CM | POA: Diagnosis not present

## 2019-10-17 DIAGNOSIS — R42 Dizziness and giddiness: Secondary | ICD-10-CM

## 2019-10-17 DIAGNOSIS — R002 Palpitations: Secondary | ICD-10-CM

## 2019-10-17 DIAGNOSIS — G4733 Obstructive sleep apnea (adult) (pediatric): Secondary | ICD-10-CM | POA: Insufficient documentation

## 2019-10-17 DIAGNOSIS — I5189 Other ill-defined heart diseases: Secondary | ICD-10-CM

## 2019-10-17 DIAGNOSIS — R0602 Shortness of breath: Secondary | ICD-10-CM

## 2019-10-24 ENCOUNTER — Telehealth: Payer: Self-pay | Admitting: Family Medicine

## 2019-10-24 NOTE — Telephone Encounter (Signed)
Spoke with patient he stated to call back in September he is out of town

## 2019-10-26 ENCOUNTER — Encounter (HOSPITAL_BASED_OUTPATIENT_CLINIC_OR_DEPARTMENT_OTHER): Payer: Self-pay | Admitting: Cardiovascular Disease

## 2019-10-26 NOTE — Procedures (Signed)
Patient Name: Dakota Gibbs, Dakota Gibbs Date: 10/17/2019 Gender: Male D.O.B: April 07, 1951 Age (years): 37 Referring Provider: Shelva Majestic MD, ABSM Height (inches): 70 Interpreting Physician: Shelva Majestic MD, ABSM Weight (lbs): 190 RPSGT: Jacolyn Reedy BMI: 27 MRN: [id number] Neck Size: <br>  CLINICAL INFORMATION Sleep Study Type: NPSG  Indication for sleep study: Diabetes, Hypertension  Epworth Sleepiness Score: 15  SLEEP STUDY TECHNIQUE As per the AASM Manual for the Scoring of Sleep and Associated Events v2.3 (April 2016) with a hypopnea requiring 4% desaturations.  The channels recorded and monitored were frontal, central and occipital EEG, electrooculogram (EOG), submentalis EMG (chin), nasal and oral airflow, thoracic and abdominal wall motion, anterior tibialis EMG, snore microphone, electrocardiogram, and pulse oximetry.  MEDICATIONS amLODipine (NORVASC) 5 MG tablet aspirin EC 81 MG tablet atorvastatin (LIPITOR) 40 MG tablet azelastine (ASTELIN) 0.1 % nasal spray cetirizine (ZYRTEC) 10 MG tablet citalopram (CELEXA) 20 MG tablet ezetimibe (ZETIA) 10 MG tablet famotidine (PEPCID) 20 MG tablet hydrochlorothiazide (MICROZIDE) 12.5 MG capsule lisinopril (ZESTRIL) 20 MG tablet Lutein 20 MG TABS metoprolol succinate (TOPROL-XL) 25 MG 24 hr tablet Multiple Vitamin (MULTIVITAMIN) tablet nitroGLYCERIN (NITROSTAT) 0.4 MG SL tablet sildenafil (VIAGRA) 100 MG tablet vitamin C (ASCORBIC ACID) 500 MG tablet vitamin E 400 UNIT capsule  Medications self-administered by patient taken the night of the study : NORVASC, CELEXA, ASPIRIN, ZESTRIL, LIPITOR, ZETIA, MICROZIDE, Toprol-xl  SLEEP ARCHITECTURE The study was initiated at 10:13:02 PM and ended at 4:32:17 AM.  Sleep onset time was 68.4 minutes and the sleep efficiency was 58.1%%. The total sleep time was 220.5 minutes.  Stage REM latency was 148.0 minutes.  The patient spent 46.3%% of the night in stage N1  sleep, 52.8%% in stage N2 sleep, 0.0%% in stage N3 and 0.9% in REM.  Alpha intrusion was absent.  Supine sleep was 14.06%.  RESPIRATORY PARAMETERS The overall apnea/hypopnea index (AHI) was 39.2 per hour. The respiratory disturbance index (RDI) was 57.4/h. There were 18 total apneas, including 13 obstructive, 5 central and 0 mixed apneas. There were 126 hypopneas and 67 RERAs.  The AHI during Stage REM sleep was 30.0 per hour.  AHI while supine was 58.1 per hour.  The mean oxygen saturation was 92.7%. The minimum SpO2 during sleep was 85.0%.  Soft snoring was noted during this study.  CARDIAC DATA The 2 lead EKG demonstrated sinus rhythm. The mean heart rate was 57.2 beats per minute. Other EKG findings include: None.  LEG MOVEMENT DATA The total PLMS were 0 with a resulting PLMS index of 0.0. Associated arousal with leg movement index was 0.0 .  IMPRESSIONS - Severe obstructive sleep apnea occurred during this study (AHI 39.2/h; RDI 57.4/h; REM 30/h). Events were worse with supine sleep (AHI 58.1/h). - No significant central sleep apnea occurred during this study (CAI = 1.4/h). - Mild oxygen desaturation was to a nadir of 85%. - The patient snored with soft snoring volume. - Reduced sleep efficiency at 58.1%. - No cardiac abnormalities were noted during this study. - Clinically significant periodic limb movements did not occur during sleep. No significant associated arousals.  DIAGNOSIS - Obstructive Sleep Apnea (327.23 [G47.33 ICD-10]) - Nocturnal Hypoxemia (327.26 [G47.36 ICD-10])  RECOMMENDATIONS - Therapeutic CPAP titration to determine optimal pressure required to alleviate sleep disordered breathing. - Effort shoulod be made to optimize nasal and oropharyngeal patency. - Positional therapy avoiding supine position during sleep. - Avoid alcohol, sedatives and other CNS depressants that may worsen sleep apnea and disrupt normal sleep architecture. -  Sleep hygiene should  be reviewed to assess factors that may improve sleep quality. - Weight management and regular exercise should be initiated or continued if appropriate.  [Electronically signed] 10/26/2019 12:55 PM  Shelva Majestic MD, [facility], ABSM Diplomate, American Board of Sleep Medicine   NPI: [id number] [place] PH: [phone]   FX: [phone] [place]

## 2019-11-15 ENCOUNTER — Telehealth: Payer: Self-pay | Admitting: *Deleted

## 2019-11-15 DIAGNOSIS — G4719 Other hypersomnia: Secondary | ICD-10-CM

## 2019-11-15 NOTE — Telephone Encounter (Signed)
-----   Message from Troy Sine, MD sent at 10/26/2019  1:01 PM EDT ----- Mariann Laster, please notify pt results of study and schedule for CPAP titration study

## 2019-11-15 NOTE — Telephone Encounter (Signed)
Informed patient of sleep study results and patient understanding was verbalized. Patient understands his sleep study showed:  IMPRESSIONS - Severe obstructive sleep apnea occurred during this study (AHI 39.2/h; RDI 57.4/h; REM 30/h). Events were worse with supine sleep (AHI 58.1/h). - No significant central sleep apnea occurred during this study (CAI = 1.4/h). - Mild oxygen desaturation was to a nadir of 85%. - The patient snored with soft snoring volume. .No cardiac abnormalities were noted during this study. - Clinically significant periodic limb movements did not occur during sleep. No significant associated arousals.  DIAGNOSIS - Obstructive Sleep Apnea (327.23 [G47.33 ICD-10]) - Nocturnal Hypoxemia (327.26 [G47.36 ICD-10])  RECOMMENDATIONS - Therapeutic CPAP titration to determine optimal pressure required to alleviate sleep disordered breathing.  Pt is aware of his results but he wants another alternative to getting a cpap like the inspire device.  Please advise

## 2019-11-16 NOTE — Telephone Encounter (Signed)
Patient is scheduled for lab study on 12/14/19. Patient understands his sleep study will be done at [facility] sleep lab. Patient understands he will receive a sleep packet in a week or so. Patient understands to call if he does not receive the sleep packet in a timely manner. Left detailed message on voicemail with date and time of titration and informed patient to call back to confirm or reschedule.

## 2019-11-17 ENCOUNTER — Telehealth: Payer: Self-pay | Admitting: Family Medicine

## 2019-11-17 NOTE — Telephone Encounter (Signed)
Left message for patient to schedule Annual Wellness Visit.  Please schedule with Nurse Health Advisor Martha Stanley, RN at [facility]  °

## 2019-11-24 ENCOUNTER — Other Ambulatory Visit: Payer: Self-pay

## 2019-11-24 MED ORDER — CITALOPRAM HYDROBROMIDE 20 MG PO TABS: 30.0000 mg | ORAL_TABLET | Freq: Every day | ORAL | 0 refills | Status: DC

## 2019-11-29 DIAGNOSIS — R5381 Other malaise: Secondary | ICD-10-CM | POA: Diagnosis not present

## 2019-11-29 DIAGNOSIS — R509 Fever, unspecified: Secondary | ICD-10-CM | POA: Diagnosis not present

## 2019-11-29 DIAGNOSIS — R519 Headache, unspecified: Secondary | ICD-10-CM | POA: Diagnosis not present

## 2019-11-29 DIAGNOSIS — R05 Cough: Secondary | ICD-10-CM | POA: Diagnosis not present

## 2019-11-29 DIAGNOSIS — Z1152 Encounter for screening for COVID-19: Secondary | ICD-10-CM | POA: Diagnosis not present

## 2019-11-29 DIAGNOSIS — Z20822 Contact with and (suspected) exposure to covid-19: Secondary | ICD-10-CM | POA: Diagnosis not present

## 2019-12-14 ENCOUNTER — Encounter (HOSPITAL_BASED_OUTPATIENT_CLINIC_OR_DEPARTMENT_OTHER): Payer: Medicare Other | Admitting: Cardiovascular Disease

## 2019-12-21 ENCOUNTER — Other Ambulatory Visit: Payer: Self-pay | Admitting: Cardiovascular Disease

## 2019-12-31 ENCOUNTER — Ambulatory Visit: Payer: Medicare Other | Attending: Internal Medicine

## 2019-12-31 DIAGNOSIS — Z23 Encounter for immunization: Secondary | ICD-10-CM

## 2019-12-31 NOTE — Progress Notes (Signed)
   Covid-19 Vaccination Clinic  Name:  RODDY BELLAMY    MRN: [id number] DOB: 11-Feb-1951  12/31/2019  Mr. Rings was observed post Covid-19 immunization for 15 minutes without incident. He was provided with Vaccine Information Sheet and instruction to access the V-Safe system.   Mr. Sizelove was instructed to call 911 with any severe reactions post vaccine: Marland Kitchen Difficulty breathing  . Swelling of face and throat  . A fast heartbeat  . A bad rash all over body  . Dizziness and weakness

## 2020-01-02 ENCOUNTER — Encounter: Payer: Self-pay | Admitting: Cardiovascular Disease

## 2020-01-02 ENCOUNTER — Other Ambulatory Visit: Payer: Self-pay

## 2020-01-02 ENCOUNTER — Ambulatory Visit (INDEPENDENT_AMBULATORY_CARE_PROVIDER_SITE_OTHER): Payer: Medicare Other | Admitting: Cardiovascular Disease

## 2020-01-02 DIAGNOSIS — I2510 Atherosclerotic heart disease of native coronary artery without angina pectoris: Secondary | ICD-10-CM

## 2020-01-02 DIAGNOSIS — G4733 Obstructive sleep apnea (adult) (pediatric): Secondary | ICD-10-CM | POA: Diagnosis not present

## 2020-01-02 DIAGNOSIS — E785 Hyperlipidemia, unspecified: Secondary | ICD-10-CM

## 2020-01-02 DIAGNOSIS — U071 COVID-19: Secondary | ICD-10-CM | POA: Diagnosis not present

## 2020-01-02 DIAGNOSIS — G4719 Other hypersomnia: Secondary | ICD-10-CM

## 2020-01-02 DIAGNOSIS — R002 Palpitations: Secondary | ICD-10-CM | POA: Diagnosis not present

## 2020-01-02 DIAGNOSIS — I10 Essential (primary) hypertension: Secondary | ICD-10-CM

## 2020-01-02 NOTE — Patient Instructions (Signed)
Medication Instructions:  CONTINUE WITH CURRENT MEDICATIONS. NO CHANGES.  *If you need a refill on your cardiac medications before your next appointment, please call your pharmacy*   Testing/Procedures: Your physician has requested that you have an echocardiogram. Echocardiography is a painless test that uses sound waves to create images of your heart. It provides your doctor with information about the size and shape of your heart and how well your heart's chambers and valves are working. This procedure takes approximately one hour. There are no restrictions for this procedure.  CPAP TITRATION STUDY- our sleep coordinator will contact you   Follow-Up: At [facility], you and your health needs are our priority.  As part of our continuing mission to provide you with exceptional heart care, we have created designated Provider Care Teams.  These Care Teams include your primary Cardiologist (physician) and Advanced Practice Providers (APPs -  Physician Assistants and Nurse Practitioners) who all work together to provide you with the care you need, when you need it.  We recommend signing up for the patient portal called "MyChart".  Sign up information is provided on this After Visit Summary.  MyChart is used to connect with patients for Virtual Visits (Telemedicine).  Patients are able to view lab/test results, encounter notes, upcoming appointments, etc.  Non-urgent messages can be sent to your provider as well.   To learn more about what you can do with MyChart, go to [email or web address].    Your next appointment:   3-4 month(s)  The format for your next appointment:   In Person  Provider:   Shelva Majestic, MD

## 2020-01-02 NOTE — Progress Notes (Signed)
Patient ID: Dakota Gibbs, male   DOB: Jul 24, 1950, 69 y.o.   MRN: [id number]    PCP: Dr. Kathlene November  HPI: [name] is a 69 y.o. male who presents to the office today for a 3 month month cardiology evaluation.  Mr. Pavel suffered an inferior wall myocardial infarction on 05/10/1995 and underwent acute intervention to a totally occluded RCA. He had more distal disease in the posterolateral vessel which was treated medically. Several days later he underwent staged intervention to circumflex marginal vessel. His last cardiac catheterization was in 2001 which did not show restenosis and actually showed coronary artery disease regression.  He has been aggressively treated since his initial event.  When I saw him several  years ago he had noticed several episodes in the early morning while sleeping that he develops nocturnal palpitations. Upon further questioning he does snore and his snoring is more significantly abnormal particularly after drinking alcohol. He wakes up one to 2 times per night.  He was started on Celexa for anxiety/depression which has helped.  He has a history of hyperlipidemia and has been tolerating Lipitor. He also has GERD, and hypertension.   Over the past year, he admits to a 25 pound weight loss. In contrast to his previous diet many years ago when he tried the [employer], his current diet is eating less along with few carbohydrates. His weight is reduced from 208-180 3 pounds. He is sleeping significantly improved. He is not aware of any snoring.  He continues to take amlodipine 5 mg, lisinopril 20, no grams, Toprol-XL 50 mg for hypertension and CAD.  He is on Zantac 150 mg for dyspepsia/GERD. He has more energy.  Lab work done by his primary physician had shown improvement in his hemoglobin A1c at 6.0, improved from 6.6.   I  saw him in March 2018.  At that time he continues to do well an was working in the executive recruiting business.  He denied chest pain  or shortness of breath.  At times he believes he may be having some short-term memory loss.    I saw him in the clinic on a DOD on Sep 06, 2018.  However, with the COVID-19 pandemic, he had not been going to the gym.  Over the past month, he has began to notice episodes of transient lightheadedness and dizziness which typically occur during physical exertion.  He denies any frank syncope.  He denies exertional chest pain or palpitations.  He has been noticing that his blood pressure has been in the 140 to 150 range.  He has continued to be on Toprol-XL 50 mg, amlodipine 5 mg, and lisinopril 20 mg.  He is on atorvastatin for hyperlipidemia.  He continues to be on Celexa.  He continues to be an executive recruiting but his work is significantly slowed down and have him most come to a complete halt during the [place] pandemic.    During that evaluation, his ECG revealed sinus bradycardia at 43 bpm.  Laboratory also had shown elevated potassium at 5.6.  I decreased lisinopril from 20 mg down to 10 mg and titrated amlodipine to 10 mg.  I provided him information regarding potassium and certain foods and fruits.  An echo Doppler study as well as carotid duplex imaging were recommended.  Repeat laboratory several days later showed a potassium of 4.9.  Patient did realize that he was having a fair amount of grapefruit juice and a Seabreeze drink which also contributed to his  potassium elevation.  His echo Doppler study showed an EF of 60 to 65% with grade 1 diastolic dysfunction.  There was minimal increased RV systolic pressure at 36.6 mm.  There was mild right atrial dilatation.  His carotid study showed mild bilateral internal carotid plaque in the 1 to 39% range with greater than 50% ECA stenoses.  He had normal antegrade flow bilaterally in the vertebral arteries and his subclavian arteries were normal.  Since I  saw him, he has monitored his blood pressure and has average 40 readings.  He states his blood  pressure has run over 40 samples averaging 142/59.  His pulse is increased slightly now in the 50s with an average of 53.  He had undergone repeat laboratory which had shown his LDL cholesterol had risen to 85.  He denies any episodes of chest pain or shortness of breath.  However, he still notes occasional episodes of transient dizziness . He presents for evaluation  He was evaluated in a telemedicine visit on Sep 20, 2018.  At that time his blood pressure had improved.  He was on amlodipine 10 mg, reduced dose of  lisinopril at 10 mg, and Toprol-XL 25 mg.  He continued to have some episodes of dizziness and bradycardia and during that evaluation recommended reduction of Toprol-XL to 12.5 mg daily.  I reviewed his echo Doppler data as well as his carotid duplex imaging studies.  He had normal bilateral antegrade flow to his vertebrals and he did not have any high-grade carotid stenoses.  With his LDL cholesterol of 85 on atorvastatin 40 mg I added Zetia 10 mg.  Repeat potassium improved to 4.9 with discontinuance of his frequent grapefruit juice.  I recommended that he undergo an event monitor for further evaluation of potential arrhythmia.  He was monitored from June 30 through November 15, 2018.  The predominant rhythm was sinus rhythm with the fastest episode of sinus tachycardia at 110 bpm at 8:28 PM on July 4.  His slowest episode was sinus bradycardia at 46 bpm while sleeping at 3:50 AM on July 8.  There were occasional PVCs representing 1% of his abnormal rhythm.  There was 1 episode of 4 beats of multifocal ventricular tachycardia on July 3.  Otherwise PVCs were isolated.  There were no episodes of atrial fibrillation.  There were no pauses.   I saw him in August 2020 at which time his syncope had improved.  Presyncope has improved.  However he does note that he still feels episodes of some lightheadedness.  Today he planted for plants in the heat.  He also cuts rubs.  He denied any definitive orthostatic  symptoms.  He walked a mile today without chest pain or dizziness.    He was last evaluated in a telemedicine visit on March 03, 2019.  At that time his symptoms had dramatically improved.  He was no longer having significant shortness of breath with walking or chest pressure.  He still noted mild fatigability.  He was unaware of palpitations.  When I last saw him in May 2021 his major complaint was that he was tired all the time.  He notes shortness of breath and fatigue particularly while walking in his garden.  He still able to walk a mile 3 to 4 days/week.  He is back at work in his executive recruiting business.  He does note some early morning palpitations typically around 5 AM.  Recently his blood pressure has been elevated and may reach 440 systolically.  I recommended dose titration of lisinopril from 10 mg up to 20 mg I also recommended he undergo a sleep study for evaluation of sleep apnea.  On October 17, 2019 he underwent his diagnostic polysomnogram.  This revealed severe obstructive sleep apnea with an AHI of 39.2, RDI of 57.4, NREM AHI of 30/h.  Events were worse with supine sleep with an AHI of 58.1.  Oxygen desaturated to a nadir of 85%.  CPAP titration was recommended.   Mr Downie developed Covid in late July and was not hospitalized but felt markedly fatigued for 3 weeks.  He never underwent a CPAP titration trial and wanted to discuss this further with me today.  He is unaware of any palpitations or orthostatic symptoms.  He presents for evaluation.   Past Medical History:  Diagnosis Date  . Allergy    enviornmental  . Anxiety   . Anxiety and depression 10/25/2012  . Back pain    L4-L5 bulging disc, L5-S1 - bulging disc, pinched nerve in neck  . CAD (coronary artery disease)      Dr Claiborne Billings, s/p stents----- sees cards yearly   . Cancer ([place])    basal cell; carcinoma removed x3 and SCC  . Depression   . Diabetes mellitus    pt denies  . GERD (gastroesophageal reflux  disease)   . History of shingles 2009  . Myocardial infarction ([place]) 1997    Past Surgical History:  Procedure Laterality Date  . ANGIOPLASTY  1997  . basel cell    . CARDIAC CATHETERIZATION     10/04/1999  . DOPPLER ECHOCARDIOGRAPHY  07/09/2006   EF 50 to 55 %, LA mildy dilated  . HERNIA REPAIR  1951   RIGHT  . KNEE ARTHROSCOPY  2001   R  . NM MYOCAR PERF WALL MOTION  08/22/2011   Mets 13,low risk study  . WRIST SURGERY  1984   right    No Known Allergies  Current Outpatient Medications  Medication Sig Dispense Refill  . amLODipine (NORVASC) 5 MG tablet TAKE 2 TABLETS EVERY DAY 180 tablet 2  . aspirin EC 81 MG tablet Take 1 tablet (81 mg total) by mouth daily. 90 tablet 3  . atorvastatin (LIPITOR) 40 MG tablet TAKE 1 TABLET EVERY DAY AT 6 PM 90 tablet 3  . azelastine (ASTELIN) 0.1 % nasal spray Place 2 sprays into both nostrils 2 (two) times daily as needed for rhinitis or allergies. Use in each nostril as directed 90 mL 3  . cetirizine (ZYRTEC) 10 MG tablet Take 10 mg by mouth daily.    . citalopram (CELEXA) 20 MG tablet Take 1.5 tablets (30 mg total) by mouth daily. 135 tablet 0  . ezetimibe (ZETIA) 10 MG tablet TAKE 1 TABLET EVERY DAY 90 tablet 2  . famotidine (PEPCID) 20 MG tablet Take 20 mg by mouth daily.    . hydrochlorothiazide (MICROZIDE) 12.5 MG capsule TAKE 1 CAPSULE EVERY DAY 90 capsule 2  . lisinopril (ZESTRIL) 20 MG tablet Take 1 tablet (20 mg total) by mouth daily. 90 tablet 3  . metoprolol succinate (TOPROL-XL) 25 MG 24 hr tablet Take 0.5 tablets (12.5 mg total) by mouth daily. Take with or immediately following a meal. 90 tablet 1  . metoprolol tartrate (LOPRESSOR) 25 MG tablet TAKE 1/2 TO 1 TABLET EVERY DAY  AS NEEDED (HEART PALPITATIONS). 90 tablet 3  . Multiple Vitamin (MULTIVITAMIN) tablet Take 1 tablet by mouth daily.      . nitroGLYCERIN (NITROSTAT) 0.4  MG SL tablet Place 1 tablet (0.4 mg total) under the tongue every 5 (five) minutes as needed for chest  pain. 25 tablet 1  . sildenafil (VIAGRA) 100 MG tablet Take 100 mg by mouth daily as needed.      . vitamin C (ASCORBIC ACID) 500 MG tablet Take 500 mg by mouth daily.      . vitamin E 400 UNIT capsule Take 400 Units by mouth daily.       No current facility-administered medications for this visit.    Social History   Socioeconomic History  . Marital status: Married    Spouse name: Not on file  . Number of children: 2  . Years of education: Not on file  . Highest education level: Not on file  Occupational History  . Occupation: Engineer, manufacturing   Tobacco Use  . Smoking status: Light Tobacco Smoker    Types: Cigars    Last attempt to quit: 03/19/1992    Years since quitting: 27.8  . Smokeless tobacco: Never Used  Vaping Use  . Vaping Use: Never used  Substance and Sexual Activity  . Alcohol use: Yes    Comment:  3 glasses wine / night  . Drug use: No  . Sexual activity: Not on file  Other Topics Concern  . Not on file  Social History Narrative   Lives w/ wife            Social Determinants of Health   Financial Resource Strain:   . Difficulty of Paying Living Expenses: Not on file  Food Insecurity:   . Worried About Charity fundraiser in the Last Year: Not on file  . Ran Out of Food in the Last Year: Not on file  Transportation Needs:   . Lack of Transportation (Medical): Not on file  . Lack of Transportation (Non-Medical): Not on file  Physical Activity:   . Days of Exercise per Week: Not on file  . Minutes of Exercise per Session: Not on file  Stress:   . Feeling of Stress : Not on file  Social Connections:   . Frequency of Communication with Friends and Family: Not on file  . Frequency of Social Gatherings with Friends and Family: Not on file  . Attends Religious Services: Not on file  . Active Member of Clubs or Organizations: Not on file  . Attends Archivist Meetings: Not on file  . Marital Status: Not on file  Intimate Partner Violence:    . Fear of Current or Ex-Partner: Not on file  . Emotionally Abused: Not on file  . Physically Abused: Not on file  . Sexually Abused: Not on file   Social history is notable in that he is married and has 2 children. He now has a dog and walks the dog 1 mile 2 times a day. He is no longer using his treadmill. There is no tobacco use. He does drink alcohol.  Family History  Problem Relation Age of Onset  . Stroke Mother        TIA  . COPD Mother   . Hypertension Sister   . Prostate cancer Father        Father and brother   . Coronary artery disease Father        GM  . Prostate cancer Brother   . Diabetes Brother        B, GM, other fami;ly members   . Colon cancer Neg Hx   . Esophageal  cancer Neg Hx   . Colon polyps Neg Hx   . Rectal cancer Neg Hx   . Stomach cancer Neg Hx     ROS General: Negative; No fevers, chills, or night sweats;  Positive for previous purposeful weight loss of 25 pounds. HEENT: Negative; No changes in vision or hearing, sinus congestion, difficulty swallowing Pulmonary: Negative; No cough, wheezing, shortness of breath, hemoptysis Cardiovascular: See HPI GI: Negative; No nausea, vomiting, diarrhea, or abdominal pain GU: Mild erectile dysfunction; No dysuria, hematuria, or difficulty voiding Musculoskeletal: Negative; no myalgias, joint pain, or weakness Hematologic/Oncology: Negative; no easy bruising, bleeding Endocrine: Negative; no heat/cold intolerance; no diabetes Neuro: Mild short-term memory loss; no changes in balance, headaches Skin: Negative; No rashes or skin lesions Psychiatric: Mild depression/anxiety Sleep: Positive for fatigability and tired all the time.  Positive for mild snoring which has significantly improved with his weight loss.  no bruxism, restless legs, hypnogognic hallucinations, no cataplexy  Other comprehensive 14 point system review is negative.   PE BP (!) 120/56   Pulse (!) 54   Ht 5\' 10"  (1.778 m)   Wt 194 lb 12.8  oz (88.4 kg)   SpO2 96%   BMI 27.95 kg/m    Repeat blood pressure by me was 128/66.  Wt Readings from Last 3 Encounters:  01/02/20 194 lb 12.8 oz (88.4 kg)  10/17/19 190 lb (86.2 kg)  09/07/19 193 lb (87.5 kg)   General: Alert, oriented, no distress.  Skin: normal turgor, no rashes, warm and dry HEENT: Normocephalic, atraumatic. Pupils equal round and reactive to light; sclera anicteric; extraocular muscles intact; Nose without nasal septal hypertrophy Mouth/Parynx benign; Mallinpatti scale 3 Neck: No JVD, no carotid bruits; normal carotid upstroke Lungs: clear to ausculatation and percussion; no wheezing or rales Chest wall: without tenderness to palpitation Heart: PMI not displaced, RRR, s1 s2 normal, 1/6 systolic murmur, no diastolic murmur, no rubs, gallops, thrills, or heaves Abdomen: soft, nontender; no hepatosplenomehaly, BS+; abdominal aorta nontender and not dilated by palpation. Back: no CVA tenderness Pulses 2+ Musculoskeletal: full range of motion, normal strength, no joint deformities Extremities: no clubbing cyanosis or edema, Homan's sign negative  Neurologic: grossly nonfocal; Cranial nerves grossly wnl Psychologic: Normal mood and affect  ECG (independently read by me):Sinus Bradycardia at 54; no ectopy, normal intervals  May 2021 ECG (independently read by me): Sinus bradycardia 56 bpm.  No ectopy.  QTc interval 467 ms  August 2020 ECG (independently read by me): Normal sinus rhythm at 61 bpm.  Sep 06, 2018 ECG (independently read by me): Marked sinus bradycardia at 43 bpm.  No ST segment changes.  PR interval 148 ms, QTc interval 419 ms.  March 2019 ECG (independently read by me): sinus bradycardia 51 bpm.  No ectopy.  Normal intervals.  March 2018 ECG (independently read by me): Sinus bradycardia 52 bpm.  Small nondiagnostic inferior Q waves.  Normal intervals  November 2016 ECG (independently read by me):  Sinus bradycardia at 49 bpm.  Small inferior Q  waves with preserved R waves.  February 2016 ECG (independently read by me): Sinus bradycardia 46 bpm.  No ectopy.  February 2015 ECG (independently read by me): Normal sinus rhythm at 62 beats per minute. Small nondiagnostic inferior Q waves. Observed R waves.  LABS:  BMP Latest Ref Rng & Units 07/22/2019 11/24/2018 09/03/2018  Glucose 70 - 99 mg/dL 105(H) 111(H) 119(H)  BUN 6 - 23 mg/dL 14 17 14   Creatinine 0.40 - 1.50 mg/dL 0.86 0.76 0.87  BUN/Creat Ratio 10 - 24 - 22 16  Sodium 135 - 145 mEq/L 137 139 139  Potassium 3.5 - 5.1 mEq/L 4.4 4.9 4.9  Chloride 96 - 112 mEq/L 100 99 103  CO2 19 - 32 mEq/L 29 24 22   Calcium 8.4 - 10.5 mg/dL 9.2 9.2 9.0      Component Value Date/Time   PROT 6.9 07/22/2019 1014   PROT 6.4 11/24/2018 0843   ALBUMIN 4.0 07/22/2019 1014   ALBUMIN 4.2 11/24/2018 0843   AST 17 07/22/2019 1014   ALT 16 07/22/2019 1014   ALKPHOS 86 07/22/2019 1014   BILITOT 1.0 07/22/2019 1014   BILITOT 0.6 11/24/2018 0843    CBC Latest Ref Rng & Units 07/22/2019 08/30/2018 02/05/2016  WBC 4.0 - 10.5 K/uL 7.4 6.3 7.4  Hemoglobin 13.0 - 17.0 g/dL 14.2 13.1 13.5  Hematocrit 39 - 52 % 41.9 39.4 39.4  Platelets 150 - 400 K/uL 246.0 253 236.0   Lab Results  Component Value Date   MCV 94.1 07/22/2019   MCV 98 (H) 08/30/2018   MCV 93.0 02/05/2016   Lab Results  Component Value Date   TSH 2.320 08/30/2018   Lab Results  Component Value Date   HGBA1C 6.0 07/22/2019    BNP No results found for: PROBNP  Lipid Panel     Component Value Date/Time   CHOL 160 07/22/2019 1014   CHOL 150 11/24/2018 0843   TRIG 146.0 07/22/2019 1014   HDL 67.90 07/22/2019 1014   HDL 77 11/24/2018 0843   CHOLHDL 2 07/22/2019 1014   VLDL 29.2 07/22/2019 1014   LDLCALC 63 07/22/2019 1014   LDLCALC 55 11/24/2018 0843    RADIOLOGY: No results found.  IMPRESSION:  1. Essential hypertension   2. OSA (obstructive sleep apnea)   3. Excessive daytime sleepiness   4. CAD in native artery    5. Palpitations   6. COVID-19 virus infection: July 2021   7. Hyperlipidemia with target LDL less than 70     ASSESSMENT AND PLAN:  Mr. Hunger is a 69 year old Caucasian male who is 24 years status post his inferior wall myocardial infarction at which time he underwent acute intervention to a totally occluded RCA and staged intervention to circumflex marginal vessel in January 1997. His last catheterization in 2001 showed plaque regression on his aggressive medical regimen.  He has been aggressively treated and has remained stable without recurrent anginal symptoms.  A Holter monitor obtained in September 2020 revealed predominant sinus rhythm with occasional isolated PVCs although there was one 4 beat episode of multifocal ventricular tachycardia.  His beta-blocker dose had been reduced because of bradycardia with heart rates in the 40s.  An echo Doppler studies demonstrated normal systolic function with EF of 60 to 65%.  When I last saw him in May, his major complaint was being fatigued and tired all the time.  At that time an Epworth scale endorsed at 11.  Due to concerns for obstructive sleep apnea I referred him for a sleep study.  I reviewed his family detail today which confirmed my suspicion for obstructive sleep apnea.  He was found to have severe obstructive sleep apnea with an AHI of 39.2/h, RDI of 57.4.  AHI while supine was 58.1/h in rem AHI was 30.0/h.  O2 nadir was 85%.  Subsequently, in late July he cannot track to the COVID-19 virus which significantly contributed to marked fatigability and ultimately took over 3 weeks to feel improved.  He does  note some mild shortness of breath with activity.  He denies palpitations.  I have strongly recommended he pursue the CPAP titration study and this will be scheduled.  Since he had a recent Covid infection I am also recommending he undergo a follow-up echo Doppler study to make certain that there is no residual myocardial effects from his  infection.  He had previously been double vaccinated.  Since his recent exposure, he also underwent a booster vaccination this past weekend.  Blood pressure today is stable on his regimen consisting of amlodipine 10 mg in addition to lisinopril 20 mg HCTZ 12.5 mg.  He is maintaining sinus rhythm and has a prescription for metoprolol tartrate as needed for palpitations which he has not needed.  He is on atorvastatin and Zetia for hyperlipidemia.  Laboratory in March 2021 showed an LDL at 93.  I will see him in 3 to 4 months for reevaluation or sooner as needed.  Troy Sine, MD, [facility]  01/03/2020 5:54 PM

## 2020-01-03 ENCOUNTER — Encounter: Payer: Self-pay | Admitting: Cardiovascular Disease

## 2020-01-05 ENCOUNTER — Telehealth: Payer: Self-pay | Admitting: *Deleted

## 2020-01-05 NOTE — Telephone Encounter (Signed)
Patient notified of CPAP titration appointment.

## 2020-01-10 ENCOUNTER — Ambulatory Visit (HOSPITAL_COMMUNITY): Payer: Medicare Other | Attending: Cardiology

## 2020-01-10 ENCOUNTER — Other Ambulatory Visit: Payer: Self-pay

## 2020-01-10 DIAGNOSIS — I2510 Atherosclerotic heart disease of native coronary artery without angina pectoris: Secondary | ICD-10-CM | POA: Insufficient documentation

## 2020-01-10 DIAGNOSIS — G4719 Other hypersomnia: Secondary | ICD-10-CM | POA: Insufficient documentation

## 2020-01-10 DIAGNOSIS — R002 Palpitations: Secondary | ICD-10-CM | POA: Diagnosis not present

## 2020-01-10 DIAGNOSIS — I10 Essential (primary) hypertension: Secondary | ICD-10-CM | POA: Diagnosis not present

## 2020-01-10 LAB — ECHOCARDIOGRAM COMPLETE
Area-P 1/2: 2.11 cm2
S' Lateral: 3.4 cm

## 2020-01-17 ENCOUNTER — Encounter: Payer: Self-pay | Admitting: Gastroenterology

## 2020-01-25 ENCOUNTER — Encounter (HOSPITAL_BASED_OUTPATIENT_CLINIC_OR_DEPARTMENT_OTHER): Payer: Self-pay

## 2020-02-06 ENCOUNTER — Other Ambulatory Visit: Payer: Self-pay | Admitting: Family Medicine

## 2020-02-07 DIAGNOSIS — M5136 Other intervertebral disc degeneration, lumbar region: Secondary | ICD-10-CM | POA: Diagnosis not present

## 2020-02-09 ENCOUNTER — Telehealth: Payer: Self-pay | Admitting: Family Medicine

## 2020-02-09 ENCOUNTER — Other Ambulatory Visit: Payer: Self-pay

## 2020-02-09 ENCOUNTER — Ambulatory Visit (HOSPITAL_BASED_OUTPATIENT_CLINIC_OR_DEPARTMENT_OTHER): Payer: Medicare Other | Attending: Cardiovascular Disease | Admitting: Cardiovascular Disease

## 2020-02-09 DIAGNOSIS — I10 Essential (primary) hypertension: Secondary | ICD-10-CM | POA: Diagnosis not present

## 2020-02-09 DIAGNOSIS — G4733 Obstructive sleep apnea (adult) (pediatric): Secondary | ICD-10-CM

## 2020-02-09 DIAGNOSIS — G4719 Other hypersomnia: Secondary | ICD-10-CM

## 2020-02-09 DIAGNOSIS — I2510 Atherosclerotic heart disease of native coronary artery without angina pectoris: Secondary | ICD-10-CM | POA: Diagnosis not present

## 2020-02-09 DIAGNOSIS — R002 Palpitations: Secondary | ICD-10-CM

## 2020-02-09 NOTE — Telephone Encounter (Signed)
LVM to call back to schedule f/u appointment for medication refill.

## 2020-02-09 NOTE — Telephone Encounter (Signed)
Would you schedule patient to come in?

## 2020-02-21 ENCOUNTER — Telehealth: Payer: Self-pay | Admitting: Cardiovascular Disease

## 2020-02-21 NOTE — Telephone Encounter (Signed)
Reached out to patient to inform him that his sleep study has not been read by dr Claiborne Billings and as soon as he reads it someone will be calling him with his results.  Left message on voicemail  With my number to call back.

## 2020-02-21 NOTE — Telephone Encounter (Signed)
Pt called and would like someone to call him with his sleep study results and the next steps   Best number [phone]

## 2020-02-21 NOTE — Telephone Encounter (Signed)
Will send this message to our sleep study pool, so sleep coordinators can further follow-up with the pt, on getting requested sleep study results.

## 2020-02-27 ENCOUNTER — Ambulatory Visit (AMBULATORY_SURGERY_CENTER): Payer: Self-pay

## 2020-02-27 ENCOUNTER — Other Ambulatory Visit: Payer: Self-pay

## 2020-02-27 VITALS — Ht 70.0 in | Wt 197.0 lb

## 2020-02-27 DIAGNOSIS — Z86010 Personal history of colonic polyps: Secondary | ICD-10-CM

## 2020-02-27 MED ORDER — NA SULFATE-K SULFATE-MG SULF 17.5-3.13-1.6 GM/177ML PO SOLN: 1.0000 | Freq: Once | ORAL | 0 refills | Status: AC

## 2020-02-27 NOTE — Progress Notes (Signed)
No egg or soy allergy known to patient  No issues with past sedation with any surgeries or procedures No intubation problems in the past  No FH of Malignant Hyperthermia No diet pills per patient No home 02 use per patient  No blood thinners per patient  Pt denies issues with constipation  No A fib or A flutter  COVID 19 guidelines implemented in PV today with Pt and RN  Coupon given to pt in PV today , Code to Pharmacy  COVID vaccines completed on 07/2019 per pt;  Due to the COVID-19 pandemic we are asking patients to follow these guidelines. Please only bring one care partner. Please be aware that your care partner may wait in the car in the parking lot or if they feel like they will be too hot to wait in the car, they may wait in the lobby on the 4th floor. All care partners are required to wear a mask the entire time (we do not have any that we can provide them), they need to practice social distancing, and we will do a Covid check for all patient's and care partners when you arrive. Also we will check their temperature and your temperature. If the care partner waits in their car they need to stay in the parking lot the entire time and we will call them on their cell phone when the patient is ready for discharge so they can bring the car to the front of the building. Also all patient's will need to wear a mask into building.

## 2020-02-28 ENCOUNTER — Other Ambulatory Visit: Payer: Self-pay | Admitting: Family Medicine

## 2020-02-29 ENCOUNTER — Encounter (HOSPITAL_BASED_OUTPATIENT_CLINIC_OR_DEPARTMENT_OTHER): Payer: Self-pay | Admitting: Cardiovascular Disease

## 2020-02-29 NOTE — Procedures (Signed)
Patient Name: Dakota Gibbs, Dakota Gibbs Date: 02/09/2020 Gender: Male D.O.B: [birth year] Age (years): 73 Referring Provider: Shelva Majestic MD, ABSM Height (inches): 70 Interpreting Physician: Shelva Majestic MD, ABSM Weight (lbs): 190 RPSGT: Baxter Flattery BMI: 27 MRN: [id number] Neck Size: 17.00  CLINICAL INFORMATION The patient is referred for a CPAP titration to treat sleep apnea.  Epworth Sleepiness Score: 15  Date of NPSG: 10/17/2019: AHI 39.2/h; RDI 57.4/h; REM AHI 30.0/h; supine AHI 58.1/h; O2 nadir 85%.  SLEEP STUDY TECHNIQUE As per the AASM Manual for the Scoring of Sleep and Associated Events v2.3 (April 2016) with a hypopnea requiring 4% desaturations.  The channels recorded and monitored were frontal, central and occipital EEG, electrooculogram (EOG), submentalis EMG (chin), nasal and oral airflow, thoracic and abdominal wall motion, anterior tibialis EMG, snore microphone, electrocardiogram, and pulse oximetry. Continuous positive airway pressure (CPAP) was initiated at the beginning of the study and titrated to treat sleep-disordered breathing.  MEDICATIONS amLODipine (NORVASC) 5 MG tablet aspirin EC 81 MG tablet atorvastatin (LIPITOR) 40 MG tablet azelastine (ASTELIN) 0.1 % nasal spray cetirizine (ZYRTEC) 10 MG tablet citalopram (CELEXA) 20 MG tablet ezetimibe (ZETIA) 10 MG tablet famotidine (PEPCID) 20 MG tablet hydrochlorothiazide (MICROZIDE) 12.5 MG capsule lisinopril (ZESTRIL) 20 MG tablet metoprolol succinate (TOPROL-XL) 25 MG 24 hr tablet metoprolol tartrate (LOPRESSOR) 25 MG tablet Multiple Vitamin (MULTIVITAMIN) tablet nitroGLYCERIN (NITROSTAT) 0.4 MG SL tablet sildenafil (VIAGRA) 100 MG tablet vitamin C (ASCORBIC ACID) 500 MG tablet vitamin E 400 UNIT capsule Medications self-administered by patient taken the night of the study : NORVASC, CELEXA, ASPIRIN, ZESTRIL, LIPITOR, ZETIA, MICROZIDE, toprol-xl  TECHNICIAN COMMENTS Comments added by  technician: PATIENT HAD DIFFICULTY FALLING ASLEEP Comments added by scorer: N/A  RESPIRATORY PARAMETERS Optimal PAP Pressure (cm): 10 AHI at Optimal Pressure (/hr): 0.0 Overall Minimal O2 (%): 89.0 Supine % at Optimal Pressure (%): 0 Minimal O2 at Optimal Pressure (%): 92.0   SLEEP ARCHITECTURE The study was initiated at 11:16:12 PM and ended at 5:21:11 AM.  Sleep onset time was 18.6 minutes and the sleep efficiency was 68.1%%. The total sleep time was 248.4 minutes.  The patient spent 5.8%% of the night in stage N1 sleep, 88.3%% in stage N2 sleep, 0.0%% in stage N3 and 5.8% in REM.Stage REM latency was 123.0 minutes  Wake after sleep onset was 98.0. Alpha intrusion was absent. Supine sleep was 0.00%.  CARDIAC DATA The 2 lead EKG demonstrated sinus rhythm. The mean heart rate was 48.0 beats per minute. Other EKG findings include: None.  LEG MOVEMENT DATA The total Periodic Limb Movements of Sleep (PLMS) were 0. The PLMS index was 0.0. A PLMS index of <15 is considered normal in adults.  IMPRESSIONS - CPAP was initiated at 5 cm and was titrated to optimal PAP pressure at 10 cm of water; AHI 0; O2 nadir 92%. - Central sleep apnea was not noted during this titration (CAI = 0.0/h). - Mild oxygen desaturations were observed during this titration to a nadir of 89.0% at 7 cm. - No snoring was audible during this study. - No cardiac abnormalities were observed during this study. - Clinically significant periodic limb movements were not noted during this study. Arousals associated with PLMs were rare.   DIAGNOSIS - Obstructive Sleep Apnea (G47.33)  RECOMMENDATIONS - Trial of CPAP therapy with EPR at 10 cm with heated humidification. A Medium size Fisher&Paykel Full Face Mask Simplus mask was used for the titration. - Effort should be made to optimize nasal and oropharyngeal patency. -  Avoid alcohol, sedatives and other CNS depressants that may worsen sleep apnea and disrupt normal sleep  architecture. - Sleep hygiene should be reviewed to assess factors that may improve sleep quality. - Weight management and regular exercise should be initiated or continued. - Recommend a download in 30 days and sleep evaluation after 4 weeks of therapy.   [Electronically signed] 02/29/2020 04:35 PM  Shelva Majestic MD, [facility], [place], American Board of Sleep Medicine   NPI: [id number] [place] PH: [phone]   FX: [phone] [place]

## 2020-03-05 ENCOUNTER — Telehealth: Payer: Self-pay | Admitting: *Deleted

## 2020-03-05 NOTE — Telephone Encounter (Signed)
Informed patient of sleep study results and patient understanding was verbalized. Patient understands her sleep study showed  IMPRESSIONS - CPAP was initiated at 5 cm and was titrated to optimal PAP pressure at 10 cm of water; AHI 0; O2 nadir 92%. - Mild oxygen desaturations were observed during this titration to a nadir of 89.0% at 7 cm. - No snoring was audible during this study. - No cardiac abnormalities were observed during this study.  DIAGNOSIS - Obstructive Sleep Apnea (G47.33)  RECOMMENDATIONS - Trial of CPAP therapy with EPR at 10 cm with heated humidification. A Medium size Fisher&Paykel Full Face Mask Simplus mask was used for the titration  Upon patient request DME selection is [facility]. Patient understands she/he will be contacted by [facility] to set up her/he cpap. Patient understands to call if [facility] does not contact her/he with new setup in a timely manner. Patient understands they will be called once confirmation has been received from [facility] that they have received their new machine to schedule 10 week follow up appointment.   [facility] notified of new cpap order  Please add to airview Patient was grateful for the call and thanked me.

## 2020-03-05 NOTE — Telephone Encounter (Signed)
-----   Message from Troy Sine, MD sent at 02/29/2020  4:40 PM EDT ----- Gae Bon, please notify pt and set up with DME for CPAP initiation

## 2020-03-08 DIAGNOSIS — Z961 Presence of intraocular lens: Secondary | ICD-10-CM | POA: Diagnosis not present

## 2020-03-08 DIAGNOSIS — H26493 Other secondary cataract, bilateral: Secondary | ICD-10-CM | POA: Diagnosis not present

## 2020-03-08 DIAGNOSIS — E119 Type 2 diabetes mellitus without complications: Secondary | ICD-10-CM | POA: Diagnosis not present

## 2020-03-12 ENCOUNTER — Other Ambulatory Visit: Payer: Self-pay

## 2020-03-12 ENCOUNTER — Ambulatory Visit (AMBULATORY_SURGERY_CENTER): Payer: Medicare Other | Admitting: Gastroenterology

## 2020-03-12 ENCOUNTER — Encounter: Payer: Self-pay | Admitting: Gastroenterology

## 2020-03-12 VITALS — BP 132/61 | HR 53 | Temp 96.6°F | Resp 11 | Ht 70.0 in | Wt 197.0 lb

## 2020-03-12 DIAGNOSIS — Z86010 Personal history of colonic polyps: Secondary | ICD-10-CM

## 2020-03-12 DIAGNOSIS — D124 Benign neoplasm of descending colon: Secondary | ICD-10-CM | POA: Diagnosis not present

## 2020-03-12 DIAGNOSIS — D127 Benign neoplasm of rectosigmoid junction: Secondary | ICD-10-CM | POA: Diagnosis not present

## 2020-03-12 DIAGNOSIS — E669 Obesity, unspecified: Secondary | ICD-10-CM | POA: Diagnosis not present

## 2020-03-12 DIAGNOSIS — D128 Benign neoplasm of rectum: Secondary | ICD-10-CM

## 2020-03-12 DIAGNOSIS — D125 Benign neoplasm of sigmoid colon: Secondary | ICD-10-CM

## 2020-03-12 DIAGNOSIS — I10 Essential (primary) hypertension: Secondary | ICD-10-CM | POA: Diagnosis not present

## 2020-03-12 DIAGNOSIS — D129 Benign neoplasm of anus and anal canal: Secondary | ICD-10-CM | POA: Diagnosis not present

## 2020-03-12 DIAGNOSIS — I2510 Atherosclerotic heart disease of native coronary artery without angina pectoris: Secondary | ICD-10-CM | POA: Diagnosis not present

## 2020-03-12 DIAGNOSIS — G4733 Obstructive sleep apnea (adult) (pediatric): Secondary | ICD-10-CM | POA: Diagnosis not present

## 2020-03-12 MED ORDER — SODIUM CHLORIDE 0.9 % IV SOLN: 500.0000 mL | Freq: Once | INTRAVENOUS | Status: DC

## 2020-03-12 NOTE — Op Note (Signed)
[facility] Patient Name: Dakota Gibbs Procedure Date: 03/12/2020 9:07 AM MRN: [id number] Endoscopist: Remo Lipps P. Havery Moros , MD Age: 69 Referring MD:  Date of Birth: Sep 22, 1950 Gender: Male Account #: [id number] Procedure:                Colonoscopy Indications:              High risk colon cancer surveillance: Personal                            history of colonic polyps (adenomas removed in 2018) Medicines:                Monitored Anesthesia Care Procedure:                Pre-Anesthesia Assessment:                           - Prior to the procedure, a History and Physical                            was performed, and patient medications and                            allergies were reviewed. The patient's tolerance of                            previous anesthesia was also reviewed. The risks                            and benefits of the procedure and the sedation                            options and risks were discussed with the patient.                            All questions were answered, and informed consent                            was obtained. Prior Anticoagulants: The patient has                            taken no previous anticoagulant or antiplatelet                            agents. ASA Grade Assessment: II - A patient with                            mild systemic disease. After reviewing the risks                            and benefits, the patient was deemed in                            satisfactory condition to undergo the procedure.  After obtaining informed consent, the colonoscope                            was passed under direct vision. Throughout the                            procedure, the patient's blood pressure, pulse, and                            oxygen saturations were monitored continuously. The                            Colonoscope was introduced through the anus and                             advanced to the the cecum, identified by                            appendiceal orifice and ileocecal valve. The                            colonoscopy was performed without difficulty. The                            patient tolerated the procedure well. The quality                            of the bowel preparation was good. The ileocecal                            valve, appendiceal orifice, and rectum were                            photographed. Scope In: 9:11:33 AM Scope Out: 9:28:00 AM Scope Withdrawal Time: 0 hours 11 minutes 22 seconds  Total Procedure Duration: 0 hours 16 minutes 27 seconds  Findings:                 The perianal and digital rectal examinations were                            normal.                           Two sessile polyps were found in the descending                            colon. The polyps were 3 mm in size. These polyps                            were removed with a cold snare. Resection and                            retrieval were complete.  A 3 mm polyp was found in the sigmoid colon. The                            polyp was sessile. The polyp was removed with a                            cold snare. Resection and retrieval were complete.                           Two sessile polyps were found in the recto-sigmoid                            colon. The polyps were 3 mm in size. These polyps                            were removed with a cold snare. Resection and                            retrieval were complete.                           A 3 mm polyp was found in the rectum. The polyp was                            sessile. The polyp was removed with a cold snare.                            Resection and retrieval were complete.                           A few small-mouthed diverticula were found in the                            transverse colon and left colon.                           Internal hemorrhoids were found  during retroflexion.                           The exam was otherwise without abnormality. Complications:            No immediate complications. Estimated blood loss:                            Minimal. Estimated Blood Loss:     Estimated blood loss was minimal. Impression:               - Two 3 mm polyps in the descending colon, removed                            with a cold snare. Resected and retrieved.                           - One 3  mm polyp in the sigmoid colon, removed with                            a cold snare. Resected and retrieved.                           - Two 3 mm polyps at the recto-sigmoid colon,                            removed with a cold snare. Resected and retrieved.                           - One 3 mm polyp in the rectum, removed with a cold                            snare. Resected and retrieved.                           - Diverticulosis in the transverse colon and in the                            left colon.                           - Internal hemorrhoids.                           - The examination was otherwise normal. Recommendation:           - Patient has a contact number available for                            emergencies. The signs and symptoms of potential                            delayed complications were discussed with the                            patient. Return to normal activities tomorrow.                            Written discharge instructions were provided to the                            patient.                           - Resume previous diet.                           - Continue present medications.                           - Await pathology results. Remo Lipps P. Adelei Scobey, MD 03/12/2020 9:32:37 AM This report has been signed electronically.

## 2020-03-12 NOTE — Patient Instructions (Signed)
Handout on polyps, diverticulosis and hemorrhoids given. ? ?YOU HAD AN ENDOSCOPIC PROCEDURE TODAY AT THE [facility]:   Refer to the procedure report that was given to you for any specific questions about what was found during the examination.  If the procedure report does not answer your questions, please call your gastroenterologist to clarify.  If you requested that your care partner not be given the details of your procedure findings, then the procedure report has been included in a sealed envelope for you to review at your convenience later. ? ?YOU SHOULD EXPECT: Some feelings of bloating in the abdomen. Passage of more gas than usual.  Walking can help get rid of the air that was put into your GI tract during the procedure and reduce the bloating. If you had a lower endoscopy (such as a colonoscopy or flexible sigmoidoscopy) you may notice spotting of blood in your stool or on the toilet paper. If you underwent a bowel prep for your procedure, you may not have a normal bowel movement for a few days. ? ?Please Note:  You might notice some irritation and congestion in your nose or some drainage.  This is from the oxygen used during your procedure.  There is no need for concern and it should clear up in a day or so. ? ?SYMPTOMS TO REPORT IMMEDIATELY: ? ?Following lower endoscopy (colonoscopy or flexible sigmoidoscopy): ? Excessive amounts of blood in the stool ? Significant tenderness or worsening of abdominal pains ? Swelling of the abdomen that is new, acute ? Fever of 100?F or higher ? ? ?For urgent or emergent issues, a gastroenterologist can be reached at any hour by calling [phone]. ?Do not use MyChart messaging for urgent concerns.  ? ? ?DIET:  We do recommend a small meal at first, but then you may proceed to your regular diet.  Drink plenty of fluids but you should avoid alcoholic beverages for 24 hours. ? ?ACTIVITY:  You should plan to take it easy for the rest of today and you  should NOT DRIVE or use heavy machinery until tomorrow (because of the sedation medicines used during the test).   ? ?FOLLOW UP: ?Our staff will call the number listed on your records 48-72 hours following your procedure to check on you and address any questions or concerns that you may have regarding the information given to you following your procedure. If we do not reach you, we will leave a message.  We will attempt to reach you two times.  During this call, we will ask if you have developed any symptoms of COVID 19. If you develop any symptoms (ie: fever, flu-like symptoms, shortness of breath, cough etc.) before then, please call [phone].  If you test positive for Covid 19 in the 2 weeks post procedure, please call and report this information to us.   ? ?If any biopsies were taken you will be contacted by phone or by letter within the next 1-3 weeks.  Please call us at [phone] if you have not heard about the biopsies in 3 weeks.  ? ? ?SIGNATURES/CONFIDENTIALITY: ?You and/or your care partner have signed paperwork which will be entered into your electronic medical record.  These signatures attest to the fact that that the information above on your After Visit Summary has been reviewed and is understood.  Full responsibility of the confidentiality of this discharge information lies with you and/or your care-partner.  ?

## 2020-03-12 NOTE — Progress Notes (Signed)
VS by SM  Pt's states no medical or surgical changes since previsit or office visit.  

## 2020-03-12 NOTE — Progress Notes (Signed)
Called to room to assist during endoscopic procedure.  Patient ID and intended procedure confirmed with present staff. Received instructions for my participation in the procedure from the performing physician.  

## 2020-03-12 NOTE — Progress Notes (Signed)
Report to PACU, RN, vss, BBS= Clear.  

## 2020-03-13 ENCOUNTER — Other Ambulatory Visit: Payer: Self-pay | Admitting: Cardiovascular Disease

## 2020-03-14 ENCOUNTER — Telehealth: Payer: Self-pay | Admitting: *Deleted

## 2020-03-14 NOTE — Telephone Encounter (Signed)
°  Follow up Call-  Call back number 03/12/2020  Post procedure Call Back phone  # [phone]  Permission to leave phone message Yes  Some recent data might be hidden     Patient questions:  Do you have a fever, pain , or abdominal swelling? No. Pain Score  0 *  Have you tolerated food without any problems? Yes.    Have you been able to return to your normal activities? Yes.    Do you have any questions about your discharge instructions: Diet   No. Medications  No. Follow up visit  No.  Do you have questions or concerns about your Care? No.  Actions: * If pain score is 4 or above: No action needed, pain <4.

## 2020-03-22 ENCOUNTER — Other Ambulatory Visit: Payer: Self-pay | Admitting: Family Medicine

## 2020-03-22 NOTE — Telephone Encounter (Signed)
Last OV 07/22/19 Last fill 02/28/20  #45/0

## 2020-04-04 NOTE — Telephone Encounter (Signed)
Appointment scheduled.

## 2020-04-13 DIAGNOSIS — Z79899 Other long term (current) drug therapy: Secondary | ICD-10-CM

## 2020-04-13 DIAGNOSIS — F528 Other sexual dysfunction not due to a substance or known physiological condition: Secondary | ICD-10-CM

## 2020-04-13 DIAGNOSIS — Z125 Encounter for screening for malignant neoplasm of prostate: Secondary | ICD-10-CM

## 2020-04-14 ENCOUNTER — Other Ambulatory Visit: Payer: Self-pay | Admitting: Family Medicine

## 2020-04-24 DIAGNOSIS — H43312 Vitreous membranes and strands, left eye: Secondary | ICD-10-CM | POA: Diagnosis not present

## 2020-04-24 DIAGNOSIS — Z961 Presence of intraocular lens: Secondary | ICD-10-CM | POA: Diagnosis not present

## 2020-04-24 DIAGNOSIS — H26493 Other secondary cataract, bilateral: Secondary | ICD-10-CM | POA: Diagnosis not present

## 2020-04-25 LAB — LIPID PANEL
Chol/HDL Ratio: 2 ratio (ref 0.0–5.0)
Cholesterol, Total: 157 mg/dL (ref 100–199)
HDL: 77 mg/dL (ref >39)
LDL Chol Calc (NIH): 54 mg/dL (ref 0–99)
Triglycerides: 158 mg/dL — ABNORMAL HIGH (ref 0–149)
VLDL Cholesterol Cal: 26 mg/dL (ref 5–40)

## 2020-04-25 LAB — COMPREHENSIVE METABOLIC PANEL
ALT: 27 IU/L (ref 0–44)
AST: 25 IU/L (ref 0–40)
Albumin/Globulin Ratio: 1.7 (ref 1.2–2.2)
Albumin: 4.3 g/dL (ref 3.7–4.7)
Alkaline Phosphatase: 95 IU/L (ref 44–121)
BUN/Creatinine Ratio: 14 (ref 10–24)
BUN: 12 mg/dL (ref 8–27)
Bilirubin Total: 0.7 mg/dL (ref 0.0–1.2)
CO2: 28 mmol/L (ref 20–29)
Calcium: 9.4 mg/dL (ref 8.6–10.2)
Chloride: 99 mmol/L (ref 96–106)
Creatinine, Ser: 0.84 mg/dL (ref 0.76–1.27)
GFR calc Af Amer: 102 mL/min/{1.73_m2} (ref >59)
GFR calc non Af Amer: 88 mL/min/{1.73_m2} (ref >59)
Globulin, Total: 2.5 g/dL (ref 1.5–4.5)
Glucose: 110 mg/dL — ABNORMAL HIGH (ref 65–99)
Potassium: 5.4 mmol/L — ABNORMAL HIGH (ref 3.5–5.2)
Sodium: 138 mmol/L (ref 134–144)
Total Protein: 6.8 g/dL (ref 6.0–8.5)

## 2020-04-25 LAB — HEMOGLOBIN A1C
Est. average glucose Bld gHb Est-mCnc: 131 mg/dL
Hgb A1c MFr Bld: 6.2 % — ABNORMAL HIGH (ref 4.8–5.6)

## 2020-04-25 LAB — CBC
Hematocrit: 43.6 % (ref 37.5–51.0)
Hemoglobin: 14.4 g/dL (ref 13.0–17.7)
MCH: 31.4 pg (ref 26.6–33.0)
MCHC: 33 g/dL (ref 31.5–35.7)
MCV: 95 fL (ref 79–97)
Platelets: 247 10*3/uL (ref 150–450)
RBC: 4.59 x10E6/uL (ref 4.14–5.80)
RDW: 12.2 % (ref 11.6–15.4)
WBC: 7.2 10*3/uL (ref 3.4–10.8)

## 2020-04-25 LAB — TSH: TSH: 2.15 u[IU]/mL (ref 0.450–4.500)

## 2020-05-01 ENCOUNTER — Other Ambulatory Visit: Payer: Self-pay

## 2020-05-01 DIAGNOSIS — E875 Hyperkalemia: Secondary | ICD-10-CM

## 2020-05-08 ENCOUNTER — Ambulatory Visit (INDEPENDENT_AMBULATORY_CARE_PROVIDER_SITE_OTHER): Payer: Medicare Other | Admitting: Cardiovascular Disease

## 2020-05-08 ENCOUNTER — Other Ambulatory Visit: Payer: Self-pay

## 2020-05-08 ENCOUNTER — Encounter: Payer: Self-pay | Admitting: Cardiovascular Disease

## 2020-05-08 ENCOUNTER — Other Ambulatory Visit: Payer: Self-pay | Admitting: Family Medicine

## 2020-05-08 VITALS — BP 132/60 | HR 52 | Ht 70.0 in | Wt 200.4 lb

## 2020-05-08 DIAGNOSIS — E785 Hyperlipidemia, unspecified: Secondary | ICD-10-CM

## 2020-05-08 DIAGNOSIS — I10 Essential (primary) hypertension: Secondary | ICD-10-CM

## 2020-05-08 DIAGNOSIS — I5189 Other ill-defined heart diseases: Secondary | ICD-10-CM

## 2020-05-08 DIAGNOSIS — I2510 Atherosclerotic heart disease of native coronary artery without angina pectoris: Secondary | ICD-10-CM | POA: Diagnosis not present

## 2020-05-08 DIAGNOSIS — U071 COVID-19: Secondary | ICD-10-CM

## 2020-05-08 DIAGNOSIS — G4733 Obstructive sleep apnea (adult) (pediatric): Secondary | ICD-10-CM | POA: Diagnosis not present

## 2020-05-08 LAB — BASIC METABOLIC PANEL
BUN/Creatinine Ratio: 12 (ref 10–24)
BUN: 10 mg/dL (ref 8–27)
CO2: 22 mmol/L (ref 20–29)
Calcium: 9.1 mg/dL (ref 8.6–10.2)
Chloride: 102 mmol/L (ref 96–106)
Creatinine, Ser: 0.84 mg/dL (ref 0.76–1.27)
GFR calc Af Amer: 102 mL/min/{1.73_m2} (ref >59)
GFR calc non Af Amer: 88 mL/min/{1.73_m2} (ref >59)
Glucose: 107 mg/dL — ABNORMAL HIGH (ref 65–99)
Potassium: 5 mmol/L (ref 3.5–5.2)
Sodium: 142 mmol/L (ref 134–144)

## 2020-05-08 NOTE — Patient Instructions (Signed)
Medication Instructions:  No changes *If you need a refill on your cardiac medications before your next appointment, please call your pharmacy*   Lab Work: BMET in our office today If you have labs (blood work) drawn today and your tests are completely normal, you will receive your results only by: Marland Kitchen MyChart Message (if you have MyChart) OR . A paper copy in the mail If you have any lab test that is abnormal or we need to change your treatment, we will call you to review the results.   Testing/Procedures: None ordered   Follow-Up: At [facility], you and your health needs are our priority.  As part of our continuing mission to provide you with exceptional heart care, we have created designated Provider Care Teams.  These Care Teams include your primary Cardiologist (physician) and Advanced Practice Providers (APPs -  Physician Assistants and Nurse Practitioners) who all work together to provide you with the care you need, when you need it.  We recommend signing up for the patient portal called "MyChart".  Sign up information is provided on this After Visit Summary.  MyChart is used to connect with patients for Virtual Visits (Telemedicine).  Patients are able to view lab/test results, encounter notes, upcoming appointments, etc.  Non-urgent messages can be sent to your provider as well.   To learn more about what you can do with MyChart, go to [email or web address].    Your next appointment:   6 month(s)  The format for your next appointment:   In Person  Provider:   Nicki Guadalajara, MD   Other Instructions None

## 2020-05-09 ENCOUNTER — Encounter: Payer: Self-pay | Admitting: Cardiovascular Disease

## 2020-05-09 NOTE — Progress Notes (Signed)
Patient ID: Dakota Gibbs, male   DOB: 05/11/1950, 70 y.o.   MRN: [id number]    PCP: Dr. Kathlene November  HPI: Dakota Gibbs is a 70 y.o. male who presents to the office today for a 5 month cardiology and sleep evaluation.  Dakota. Dashner suffered an inferior wall myocardial infarction on 05/10/1995 and underwent acute intervention to a totally occluded RCA. He had more distal disease in the posterolateral vessel which was treated medically. Several days later he underwent staged intervention to circumflex marginal vessel. His last cardiac catheterization was in 2001 which did not show restenosis and actually showed coronary artery disease regression.  He has been aggressively treated since his initial event.  When I saw him several  years ago he had noticed several episodes in the early morning while sleeping that he develops nocturnal palpitations. Upon further questioning he does snore and his snoring is more significantly abnormal particularly after drinking alcohol. He wakes up one to 2 times per night.  He was started on Celexa for anxiety/depression which has helped.  He has a history of hyperlipidemia and has been tolerating Lipitor. He also has GERD, and hypertension.   Over the past year, he admits to a 25 pound weight loss. In contrast to his previous diet many years ago when he tried the [employer], his current diet is eating less along with few carbohydrates. His weight is reduced from 208-180 3 pounds. He is sleeping significantly improved. He is not aware of any snoring.  He continues to take amlodipine 5 mg, lisinopril 20, no grams, Toprol-XL 50 mg for hypertension and CAD.  He is on Zantac 150 mg for dyspepsia/GERD. He has more energy.  Lab work done by his primary physician had shown improvement in his hemoglobin A1c at 6.0, improved from 6.6.   I  saw him in March 2018.  At that time he continues to do well an was working in the executive recruiting business.  He denied chest  pain or shortness of breath.  At times he believes he may be having some short-term memory loss.    I saw him in the clinic on a DOD on Sep 06, 2018.  However, with the COVID-19 pandemic, he had not been going to the gym.  Over the past month, he has began to notice episodes of transient lightheadedness and dizziness which typically occur during physical exertion.  He denies any frank syncope.  He denies exertional chest pain or palpitations.  He has been noticing that his blood pressure has been in the 140 to 150 range.  He has continued to be on Toprol-XL 50 mg, amlodipine 5 mg, and lisinopril 20 mg.  He is on atorvastatin for hyperlipidemia.  He continues to be on Celexa.  He continues to be an executive recruiting but his work is significantly slowed down and have him most come to a complete halt during the [place] pandemic.    During that evaluation, his ECG revealed sinus bradycardia at 43 bpm.  Laboratory also had shown elevated potassium at 5.6.  I decreased lisinopril from 20 mg down to 10 mg and titrated amlodipine to 10 mg.  I provided him information regarding potassium and certain foods and fruits.  An echo Doppler study as well as carotid duplex imaging were recommended.  Repeat laboratory several days later showed a potassium of 4.9.  Patient did realize that he was having a fair amount of grapefruit juice and a Seabreeze drink which also contributed to  his potassium elevation.  His echo Doppler study showed an EF of 60 to 65% with grade 1 diastolic dysfunction.  There was minimal increased RV systolic pressure at 76.8 mm.  There was mild right atrial dilatation.  His carotid study showed mild bilateral internal carotid plaque in the 1 to 39% range with greater than 50% ECA stenoses.  He had normal antegrade flow bilaterally in the vertebral arteries and his subclavian arteries were normal.  Since I  saw him, he has monitored his blood pressure and has average 40 readings.  He states his blood  pressure has run over 40 samples averaging 142/59.  His pulse is increased slightly now in the 50s with an average of 53.  He had undergone repeat laboratory which had shown his LDL cholesterol had risen to 85.  He denies any episodes of chest pain or shortness of breath.  However, he still notes occasional episodes of transient dizziness . He presents for evaluation  He was evaluated in a telemedicine visit on Sep 20, 2018.  At that time his blood pressure had improved.  He was on amlodipine 10 mg, reduced dose of  lisinopril at 10 mg, and Toprol-XL 25 mg.  He continued to have some episodes of dizziness and bradycardia and during that evaluation recommended reduction of Toprol-XL to 12.5 mg daily.  I reviewed his echo Doppler data as well as his carotid duplex imaging studies.  He had normal bilateral antegrade flow to his vertebrals and he did not have any high-grade carotid stenoses.  With his LDL cholesterol of 85 on atorvastatin 40 mg I added Zetia 10 mg.  Repeat potassium improved to 4.9 with discontinuance of his frequent grapefruit juice.  I recommended that he undergo an event monitor for further evaluation of potential arrhythmia.  He was monitored from June 30 through November 15, 2018.  The predominant rhythm was sinus rhythm with the fastest episode of sinus tachycardia at 110 bpm at 8:28 PM on July 4.  His slowest episode was sinus bradycardia at 46 bpm while sleeping at 3:50 AM on July 8.  There were occasional PVCs representing 1% of his abnormal rhythm.  There was 1 episode of 4 beats of multifocal ventricular tachycardia on July 3.  Otherwise PVCs were isolated.  There were no episodes of atrial fibrillation.  There were no pauses.   I saw him in August 2020 at which time his syncope had improved.  Presyncope has improved.  However he does note that he still feels episodes of some lightheadedness.  Today he planted for plants in the heat.  He also cuts rubs.  He denied any definitive orthostatic  symptoms.  He walked a mile today without chest pain or dizziness.    He was last evaluated in a telemedicine visit on March 03, 2019.  At that time his symptoms had dramatically improved.  He was no longer having significant shortness of breath with walking or chest pressure.  He still noted mild fatigability.  He was unaware of palpitations.  When I last saw him in May 2021 his major complaint was that he was tired all the time.  He notes shortness of breath and fatigue particularly while walking in his garden.  He still able to walk a mile 3 to 4 days/week.  He is back at work in his executive recruiting business.  He does note some early morning palpitations typically around 5 AM.  Recently his blood pressure has been elevated and may reach 160  systolically.  I recommended dose titration of lisinopril from 10 mg up to 20 mg I also recommended he undergo a sleep study for evaluation of sleep apnea.  On October 17, 2019 he underwent his diagnostic polysomnogram.  This revealed severe obstructive sleep apnea with an AHI of 39.2, RDI of 57.4, NREM AHI of 30/h.  Events were worse with supine sleep with an AHI of 58.1.  Oxygen desaturated to a nadir of 85%.  CPAP titration was recommended.   Dakota Gibbs developed Covid in late July and was not hospitalized but felt markedly fatigued for 3 weeks.  He never underwent a CPAP titration trial and wanted to discuss this further with me today.  He is unaware of any palpitations or orthostatic symptoms.    When I last saw him in August 2021 strongly recommended he pursue the CPAP titration study.  I also recommended he undergo a follow-up echo Doppler study following his Covid infection to make certain there was no residual myocardial effects.  Following his Covid exposure he underwent a booster vaccination.  He was maintaining sinus rhythm and his blood pressure was stable.  His echo Doppler study was done on 01/10/2020 which showed an EF of 55 to 60% with grade 1  diastolic dysfunction.  There is mild biatrial enlargement.  There was mild dilation of his ascending aorta millimeters.  He underwent CPAP titration on 02/09/2020 and was titrated up to 10 cm water pressure with AHI of 0 and O2 nadir at 92.  Dakota Gibbs's CPAP set up date was 04/12/2020.  Since initiating therapy he has used CPAP with 100% compliance.  A download was obtained from his initiation until presently which is approximately 3 weeks.  Average usage was 9 hours and 38 minutes.  At a 10 cm set pressure, AHI was 7.2 he did have central events at 2.5/h.  He believes he is sleeping much better since initiating CPAP therapy.  He typically goes to bed at 9 PM and wakes up sometime around 8 AM.  On therapy during has resolved.  He had increased disability from 5-6.  He denies any residual daytime sleepiness.  In use Epworth Sleepiness Scale score was calculated in the office today and this endorsed at 4.  He presents for follow-up evaluation.   Past Medical History:  Diagnosis Date  . Allergy    enviornmental  . Anxiety   . Anxiety and depression 10/25/2012  . Arthritis    self dx  . Back pain    L4-L5 bulging disc, L5-S1 - bulging disc, pinched nerve in neck  . CAD (coronary artery disease)      Dr Claiborne Billings, s/p stents----- sees cards yearly   . Cancer ([place])    basal cell; carcinoma removed x3 and SCC  . Cataract    bilateral sx  . Depression   . Diabetes mellitus    pt denies  . GERD (gastroesophageal reflux disease)    on meds  . History of shingles 2009  . Hyperlipidemia    on meds  . Hypertension    on meds  . Myocardial infarction ([place]) 1997   caused by a blood clot  . Sleep apnea    not have a CPAP as of 02/27/2020-just dx    Past Surgical History:  Procedure Laterality Date  . ANGIOPLASTY  1997  . basel cell     skin carcinoma removed x 4  . CARDIAC CATHETERIZATION     10/04/1999  . COLONOSCOPY  2018  SA-MAC-suprep(good)-TA  . DOPPLER ECHOCARDIOGRAPHY  07/09/2006    EF 50 to 55 %, LA mildy dilated  . HERNIA REPAIR  1951   RIGHT  . KNEE ARTHROSCOPY Right 2001   R  . NM MYOCAR PERF WALL MOTION  08/22/2011   Mets 13,low risk study  . POLYPECTOMY  2018   TA  . WRIST SURGERY  1984   right    No Known Allergies  Current Outpatient Medications  Medication Sig Dispense Refill  . amLODipine (NORVASC) 5 MG tablet TAKE 2 TABLETS EVERY DAY 180 tablet 2  . aspirin EC 81 MG tablet Take 1 tablet (81 mg total) by mouth daily. 90 tablet 3  . atorvastatin (LIPITOR) 40 MG tablet TAKE 1 TABLET EVERY DAY AT 6 PM 90 tablet 3  . azelastine (ASTELIN) 0.1 % nasal spray Place 2 sprays into both nostrils 2 (two) times daily as needed for rhinitis or allergies. Use in each nostril as directed 90 mL 3  . cetirizine (ZYRTEC) 10 MG tablet Take 10 mg by mouth daily.    Marland Kitchen ezetimibe (ZETIA) 10 MG tablet TAKE 1 TABLET EVERY DAY 90 tablet 2  . famotidine (PEPCID) 20 MG tablet Take 20 mg by mouth daily.    . hydrochlorothiazide (MICROZIDE) 12.5 MG capsule TAKE 1 CAPSULE EVERY DAY 90 capsule 2  . lisinopril (ZESTRIL) 20 MG tablet Take 1 tablet (20 mg total) by mouth daily. 90 tablet 3  . metoprolol succinate (TOPROL-XL) 25 MG 24 hr tablet Take 0.5 tablets (12.5 mg total) by mouth daily. Take with or immediately following a meal. 90 tablet 1  . metoprolol tartrate (LOPRESSOR) 25 MG tablet TAKE 1/2 TO 1 TABLET EVERY DAY  AS NEEDED (HEART PALPITATIONS). 90 tablet 3  . Multiple Vitamin (MULTIVITAMIN) tablet Take 1 tablet by mouth daily.    . nitroGLYCERIN (NITROSTAT) 0.4 MG SL tablet Place 1 tablet (0.4 mg total) under the tongue every 5 (five) minutes as needed for chest pain. 25 tablet 1  . sildenafil (VIAGRA) 100 MG tablet Take 100 mg by mouth daily as needed.    . vitamin C (ASCORBIC ACID) 500 MG tablet Take 500 mg by mouth daily.    . vitamin E 400 UNIT capsule Take 400 Units by mouth daily.    . citalopram (CELEXA) 20 MG tablet TAKE 1 AND 1/2 TABLETS EVERY DAY (NEED MD APPOINTMENT  FOR REFILLS) 45 tablet 0   No current facility-administered medications for this visit.    Social History   Socioeconomic History  . Marital status: Married    Spouse name: Not on file  . Number of children: 2  . Years of education: Not on file  . Highest education level: Not on file  Occupational History  . Occupation: Engineer, manufacturing   Tobacco Use  . Smoking status: Light Tobacco Smoker    Types: Cigars    Last attempt to quit: 03/19/1992    Years since quitting: 28.1  . Smokeless tobacco: Never Used  . Tobacco comment: 1 cigar per month  Vaping Use  . Vaping Use: Never used  Substance and Sexual Activity  . Alcohol use: Yes    Alcohol/week: 21.0 standard drinks    Types: 21 Glasses of wine per week    Comment:  3 glasses wine / night  . Drug use: No  . Sexual activity: Not on file  Other Topics Concern  . Not on file  Social History Narrative   Lives w/ wife  Social Determinants of Health   Financial Resource Strain: Not on file  Food Insecurity: Not on file  Transportation Needs: Not on file  Physical Activity: Not on file  Stress: Not on file  Social Connections: Not on file  Intimate Partner Violence: Not on file   Social history is notable in that he is married and has 2 children. He now has a dog and walks the dog 1 mile 2 times a day. He is no longer using his treadmill. There is no tobacco use. He does drink alcohol.  Family History  Problem Relation Age of Onset  . Stroke Mother        TIA  . COPD Mother   . Hypertension Sister   . Prostate cancer Father 2  . Coronary artery disease Father        GM  . Diabetes Brother   . Prostate cancer Brother 62  . Diabetes Maternal Grandmother   . Colon cancer Neg Hx   . Esophageal cancer Neg Hx   . Colon polyps Neg Hx   . Rectal cancer Neg Hx   . Stomach cancer Neg Hx     ROS General: Negative; No fevers, chills, or night sweats;  Positive for previous purposeful weight loss of 25  pounds. HEENT: Negative; No changes in vision or hearing, sinus congestion, difficulty swallowing Pulmonary: Negative; No cough, wheezing, shortness of breath, hemoptysis Cardiovascular: See HPI GI: Negative; No nausea, vomiting, diarrhea, or abdominal pain GU: Mild erectile dysfunction; No dysuria, hematuria, or difficulty voiding Musculoskeletal: Negative; no myalgias, joint pain, or weakness Hematologic/Oncology: Negative; no easy bruising, bleeding Endocrine: Negative; no heat/cold intolerance; no diabetes Neuro: Mild short-term memory loss; no changes in balance, headaches Skin: Negative; No rashes or skin lesions Psychiatric: Mild depression/anxiety Sleep: Positive for fatigability and tired all the time.  Positive for mild snoring which has significantly improved with his weight loss.  no bruxism, restless legs, hypnogognic hallucinations, no cataplexy  Other comprehensive 14 point system review is negative.   PE BP 132/60   Pulse (!) 52   Ht _0  (1.778 m)   Wt 200 lb 6.4 oz (90.9 kg)   BMI 28.75 kg/m    Repeat blood pressure by me was 128/62  Wt Readings from Last 3 Encounters:  05/08/20 200 lb 6.4 oz (90.9 kg)  03/12/20 197 lb (89.4 kg)  02/27/20 197 lb (89.4 kg)   BP 132/60   Pulse (!) 52   Ht _1  (1.778 m)   Wt 200 lb 6.4 oz (90.9 kg)   BMI 28.75 kg/m  General: Alert, oriented, no distress.  Skin: normal turgor, no rashes, warm and dry HEENT: Normocephalic, atraumatic. Pupils equal round and reactive to light; sclera anicteric; extraocular muscles intact;  Nose without nasal septal hypertrophy Mouth/Parynx benign; Mallinpatti scale 3 Neck: No JVD, no carotid bruits; normal carotid upstroke Lungs: clear to ausculatation and percussion; no wheezing or rales Chest wall: without tenderness to palpitation Heart: PMI not displaced, RRR, s1 s2 normal, 1/6 systolic murmur, no diastolic murmur, no rubs, gallops, thrills, or heaves Abdomen: soft, nontender; no  hepatosplenomehaly, BS+; abdominal aorta nontender and not dilated by palpation. Back: no CVA tenderness Pulses 2+ Musculoskeletal: full range of motion, normal strength, no joint deformities Extremities: no clubbing cyanosis or edema, Homan's sign negative  Neurologic: grossly nonfocal; Cranial nerves grossly wnl Psychologic: Normal mood and affect   ECG (independently read by me): Sinus bradycardia 52 bpm.  No ectopy.  Normal intervals  August  98,9211 ECG (independently read by me):Sinus Bradycardia at 54; no ectopy, normal intervals  May 2021 ECG (independently read by me): Sinus bradycardia 56 bpm.  No ectopy.  QTc interval 467 ms  August 2020 ECG (independently read by me): Normal sinus rhythm at 61 bpm.  Sep 06, 2018 ECG (independently read by me): Marked sinus bradycardia at 43 bpm.  No ST segment changes.  PR interval 148 ms, QTc interval 419 ms.  March 2019 ECG (independently read by me): sinus bradycardia 51 bpm.  No ectopy.  Normal intervals.  March 2018 ECG (independently read by me): Sinus bradycardia 52 bpm.  Small nondiagnostic inferior Q waves.  Normal intervals  November 2016 ECG (independently read by me):  Sinus bradycardia at 49 bpm.  Small inferior Q waves with preserved R waves.  February 2016 ECG (independently read by me): Sinus bradycardia 46 bpm.  No ectopy.  February 2015 ECG (independently read by me): Normal sinus rhythm at 62 beats per minute. Small nondiagnostic inferior Q waves. Observed R waves.  LABS:  BMP Latest Ref Rng & Units 05/08/2020 04/25/2020 07/22/2019  Glucose 65 - 99 mg/dL 107(H) 110(H) 105(H)  BUN 8 - 27 mg/dL _0 Creatinine 0.76 - 1.27 mg/dL 0.84 0.84 0.86  BUN/Creat Ratio 10 - _1 -  Sodium 134 - 144 mmol/L 142 138 137  Potassium 3.5 - 5.2 mmol/L 5.0 5.4(H) 4.4  Chloride 96 - 106 mmol/L 102 99 100  CO2 20 - 29 mmol/L _2 Calcium 8.6 - 10.2 mg/dL 9.1 9.4 9.2      Component Value Date/Time   PROT 6.8 04/25/2020  0854   ALBUMIN 4.3 04/25/2020 0854   AST 25 04/25/2020 0854   ALT 27 04/25/2020 0854   ALKPHOS 95 04/25/2020 0854   BILITOT 0.7 04/25/2020 0854    CBC Latest Ref Rng & Units 04/25/2020 07/22/2019 08/30/2018  WBC 3.4 - 10.8 x10E3/uL 7.2 7.4 6.3  Hemoglobin 13.0 - 17.7 g/dL 14.4 14.2 13.1  Hematocrit 37.5 - 51.0 % 43.6 41.9 39.4  Platelets 150 - 450 x10E3/uL 247 246.0 253   Lab Results  Component Value Date   MCV 95 04/25/2020   MCV 94.1 07/22/2019   MCV 98 (H) 08/30/2018   Lab Results  Component Value Date   TSH 2.150 04/25/2020   Lab Results  Component Value Date   HGBA1C 6.2 (H) 04/25/2020    BNP No results found for: PROBNP  Lipid Panel     Component Value Date/Time   CHOL 157 04/25/2020 0854   TRIG 158 (H) 04/25/2020 0854   HDL 77 04/25/2020 0854   CHOLHDL 2.0 04/25/2020 0854   CHOLHDL 2 07/22/2019 1014   VLDL 29.2 07/22/2019 1014   LDLCALC 54 04/25/2020 0854    RADIOLOGY: No results found.  IMPRESSION:  1. Essential hypertension   2. OSA (obstructive sleep apnea)   3. CAD in native artery   4. Hyperlipidemia with target LDL less than 70   5. Grade I diastolic dysfunction   6. COVID-19 virus infection: July 2021     ASSESSMENT AND PLAN:  Dakota Gibbs is a 71 year old Caucasian male who is 25 years status post his inferior wall myocardial infarction at which time he underwent acute intervention to a totally occluded RCA and staged intervention to circumflex marginal vessel in January 1997. His last catheterization in 2001 showed plaque regression on his aggressive medical regimen.  He has been aggressively treated and has remained  stable without recurrent anginal symptoms.  A Holter monitor obtained in September 2020 revealed predominant sinus rhythm with occasional isolated PVCs although there was one 4 beat episode of multifocal ventricular tachycardia.  His beta-blocker dose had been reduced because of bradycardia with heart rates in the 40s.  An echo  Doppler studies demonstrated normal systolic function with EF of 60 to 65%.  When I  saw him in May, his major complaint was being fatigued and tired all the time.  At that time an Epworth scale endorsed at 11.  Due to concerns for obstructive sleep apnea I referred him for a sleep study.  He was found to have severe sleep apnea with an AHI of 39.2/h, RDI 57.4.  AHI while supine was very severe at 58.1 and REM sleep AHI was 30.0.  O2 nadir was 85%.  At his last evaluation he underwent a follow-up echo Doppler study following a COVID-19 infection this continue to show normal LV systolic function EF of 55 to 60% and grade 1 diastolic dysfunction.  I reviewed his CPAP titration study which was done in October.  He has now been on CPAP therapy since 04/12/2020.  Compliance is 100%.'s average usage is 9 hours and 38 minutes.  At 10 cm water pressure AHI was improved but still mildly elevated at 7.2.  I have recommended we change him from a set pressure to an auto mode with a range of 10 to 15 cm.  I am also decreasing his ramp time down to 5 minutes.  He notes marked improvement in his sleep pattern and there is been resolution of prior snoring.  We will obtain a follow-up download in 1 to 2 weeks to verify 30 days of compliance in 6 the download today was just short of 30 days.  Recent laboratory had shown mild potassium elevation at 5.4.  I am recommending a repeat be met obtained today.  His blood pressure today is stable and a repeat by me was 128/62 on amlodipine 5 mg, lisinopril 20 mg, and metoprolol 12.5 mg.  He is on atorvastatin 40 mg with target LDL less than 70.  Laboratory from 04/25/2020 showed an LDL at 54.  He is not having any anginal symptomatology.  I will see him for follow-up evaluation in 6 months or sooner as needed.   Dakota Sine, MD, [facility]  05/09/2020 6:16 PM

## 2020-05-14 MED ORDER — NITROGLYCERIN 0.4 MG SL SUBL: 0.4000 mg | SUBLINGUAL_TABLET | SUBLINGUAL | 1 refills | Status: DC | PRN

## 2020-05-23 DIAGNOSIS — H26493 Other secondary cataract, bilateral: Secondary | ICD-10-CM | POA: Diagnosis not present

## 2020-05-23 DIAGNOSIS — Z961 Presence of intraocular lens: Secondary | ICD-10-CM | POA: Diagnosis not present

## 2020-05-23 DIAGNOSIS — H43312 Vitreous membranes and strands, left eye: Secondary | ICD-10-CM | POA: Diagnosis not present

## 2020-05-24 ENCOUNTER — Ambulatory Visit: Payer: Medicare Other | Admitting: Family Medicine

## 2020-05-30 ENCOUNTER — Other Ambulatory Visit: Payer: Self-pay | Admitting: Family Medicine

## 2020-06-15 DIAGNOSIS — C44619 Basal cell carcinoma of skin of left upper limb, including shoulder: Secondary | ICD-10-CM | POA: Diagnosis not present

## 2020-06-15 DIAGNOSIS — L570 Actinic keratosis: Secondary | ICD-10-CM | POA: Diagnosis not present

## 2020-06-15 DIAGNOSIS — D485 Neoplasm of uncertain behavior of skin: Secondary | ICD-10-CM | POA: Diagnosis not present

## 2020-06-15 DIAGNOSIS — Z85828 Personal history of other malignant neoplasm of skin: Secondary | ICD-10-CM | POA: Diagnosis not present

## 2020-06-15 DIAGNOSIS — L739 Follicular disorder, unspecified: Secondary | ICD-10-CM | POA: Diagnosis not present

## 2020-06-15 DIAGNOSIS — C44519 Basal cell carcinoma of skin of other part of trunk: Secondary | ICD-10-CM | POA: Diagnosis not present

## 2020-06-15 DIAGNOSIS — L821 Other seborrheic keratosis: Secondary | ICD-10-CM | POA: Diagnosis not present

## 2020-06-15 DIAGNOSIS — L814 Other melanin hyperpigmentation: Secondary | ICD-10-CM | POA: Diagnosis not present

## 2020-06-23 ENCOUNTER — Other Ambulatory Visit: Payer: Self-pay | Admitting: Family Medicine

## 2020-06-25 ENCOUNTER — Ambulatory Visit: Payer: Medicare Other | Admitting: Family Medicine

## 2020-06-25 NOTE — Telephone Encounter (Signed)
Last OV 07/22/19 Last fill 05/30/20 #45/0

## 2020-06-25 NOTE — Telephone Encounter (Signed)
Please see message below and advise. Thank you.

## 2020-07-10 ENCOUNTER — Ambulatory Visit: Payer: Medicare Other | Admitting: Family Medicine

## 2020-07-16 ENCOUNTER — Other Ambulatory Visit: Payer: Self-pay | Admitting: Family Medicine

## 2020-07-31 ENCOUNTER — Other Ambulatory Visit: Payer: Self-pay | Admitting: *Deleted

## 2020-07-31 ENCOUNTER — Other Ambulatory Visit: Payer: Self-pay

## 2020-07-31 MED ORDER — EZETIMIBE 10 MG PO TABS: 10.0000 mg | ORAL_TABLET | Freq: Every day | ORAL | 2 refills | Status: DC

## 2020-07-31 MED ORDER — EZETIMIBE 10 MG PO TABS: 10.0000 mg | ORAL_TABLET | Freq: Every day | ORAL | 3 refills | Status: DC

## 2020-08-03 ENCOUNTER — Other Ambulatory Visit: Payer: Self-pay

## 2020-08-03 ENCOUNTER — Encounter: Payer: Self-pay | Admitting: Family Medicine

## 2020-08-03 ENCOUNTER — Ambulatory Visit (INDEPENDENT_AMBULATORY_CARE_PROVIDER_SITE_OTHER): Payer: Medicare Other

## 2020-08-03 ENCOUNTER — Ambulatory Visit (INDEPENDENT_AMBULATORY_CARE_PROVIDER_SITE_OTHER): Payer: Medicare Other | Admitting: Family Medicine

## 2020-08-03 VITALS — BP 140/68 | HR 54 | Temp 97.0°F | Wt 200.2 lb

## 2020-08-03 DIAGNOSIS — N3943 Post-void dribbling: Secondary | ICD-10-CM | POA: Diagnosis not present

## 2020-08-03 DIAGNOSIS — F419 Anxiety disorder, unspecified: Secondary | ICD-10-CM | POA: Diagnosis not present

## 2020-08-03 DIAGNOSIS — Z789 Other specified health status: Secondary | ICD-10-CM

## 2020-08-03 DIAGNOSIS — E1142 Type 2 diabetes mellitus with diabetic polyneuropathy: Secondary | ICD-10-CM | POA: Diagnosis not present

## 2020-08-03 DIAGNOSIS — E785 Hyperlipidemia, unspecified: Secondary | ICD-10-CM

## 2020-08-03 DIAGNOSIS — F32A Depression, unspecified: Secondary | ICD-10-CM | POA: Diagnosis not present

## 2020-08-03 DIAGNOSIS — R059 Cough, unspecified: Secondary | ICD-10-CM | POA: Diagnosis not present

## 2020-08-03 DIAGNOSIS — Z7289 Other problems related to lifestyle: Secondary | ICD-10-CM | POA: Diagnosis not present

## 2020-08-03 DIAGNOSIS — D649 Anemia, unspecified: Secondary | ICD-10-CM | POA: Insufficient documentation

## 2020-08-03 DIAGNOSIS — I10 Essential (primary) hypertension: Secondary | ICD-10-CM

## 2020-08-03 DIAGNOSIS — R202 Paresthesia of skin: Secondary | ICD-10-CM

## 2020-08-03 DIAGNOSIS — R1030 Lower abdominal pain, unspecified: Secondary | ICD-10-CM

## 2020-08-03 DIAGNOSIS — Z720 Tobacco use: Secondary | ICD-10-CM | POA: Diagnosis not present

## 2020-08-03 DIAGNOSIS — N401 Enlarged prostate with lower urinary tract symptoms: Secondary | ICD-10-CM | POA: Diagnosis not present

## 2020-08-03 LAB — B12 AND FOLATE PANEL
Folate: >23.6 ng/mL (ref >5.9)
Vitamin B-12: 640 pg/mL (ref 211–911)

## 2020-08-03 LAB — URINALYSIS, ROUTINE W REFLEX MICROSCOPIC
Bilirubin Urine: NEGATIVE
Hgb urine dipstick: NEGATIVE
Leukocytes,Ua: NEGATIVE
Nitrite: NEGATIVE
RBC / HPF: NONE SEEN (ref 0–?)
Specific Gravity, Urine: 1.015 (ref 1.000–1.030)
Total Protein, Urine: NEGATIVE
Urine Glucose: NEGATIVE
Urobilinogen, UA: 0.2 (ref 0.0–1.0)
WBC, UA: NONE SEEN (ref 0–?)
pH: 6.5 (ref 5.0–8.0)

## 2020-08-03 LAB — BASIC METABOLIC PANEL
BUN: 17 mg/dL (ref 6–23)
CO2: 28 mEq/L (ref 19–32)
Calcium: 9.1 mg/dL (ref 8.4–10.5)
Chloride: 102 mEq/L (ref 96–112)
Creatinine, Ser: 0.92 mg/dL (ref 0.40–1.50)
GFR: 83.56 mL/min (ref >60.00)
Glucose, Bld: 115 mg/dL — ABNORMAL HIGH (ref 70–99)
Potassium: 4.7 mEq/L (ref 3.5–5.1)
Sodium: 138 mEq/L (ref 135–145)

## 2020-08-03 LAB — HEPATIC FUNCTION PANEL
ALT: 28 U/L (ref 0–53)
AST: 22 U/L (ref 0–37)
Albumin: 4 g/dL (ref 3.5–5.2)
Alkaline Phosphatase: 89 U/L (ref 39–117)
Bilirubin, Direct: 0.1 mg/dL (ref 0.0–0.3)
Total Bilirubin: 0.5 mg/dL (ref 0.2–1.2)
Total Protein: 6.9 g/dL (ref 6.0–8.3)

## 2020-08-03 LAB — HEMOGLOBIN A1C: Hgb A1c MFr Bld: 6.5 % (ref 4.6–6.5)

## 2020-08-03 LAB — LIPID PANEL
Cholesterol: 152 mg/dL (ref 0–200)
HDL: 60.5 mg/dL (ref >39.00)
LDL Cholesterol: 58 mg/dL (ref 0–99)
NonHDL: 91.47
Total CHOL/HDL Ratio: 3
Triglycerides: 165 mg/dL — ABNORMAL HIGH (ref 0.0–149.0)
VLDL: 33 mg/dL (ref 0.0–40.0)

## 2020-08-03 LAB — CBC
HCT: 41.9 % (ref 39.0–52.0)
Hemoglobin: 14.3 g/dL (ref 13.0–17.0)
MCHC: 34.1 g/dL (ref 30.0–36.0)
MCV: 93 fl (ref 78.0–100.0)
Platelets: 197 10*3/uL (ref 150.0–400.0)
RBC: 4.51 Mil/uL (ref 4.22–5.81)
RDW: 12.9 % (ref 11.5–15.5)
WBC: 6 10*3/uL (ref 4.0–10.5)

## 2020-08-03 LAB — TSH: TSH: 1.44 u[IU]/mL (ref 0.35–4.50)

## 2020-08-03 LAB — MICROALBUMIN / CREATININE URINE RATIO
Creatinine,U: 194.7 mg/dL
Microalb Creat Ratio: 0.5 mg/g (ref 0.0–30.0)
Microalb, Ur: 1 mg/dL (ref 0.0–1.9)

## 2020-08-03 LAB — PSA: PSA: 1.17 ng/mL (ref 0.10–4.00)

## 2020-08-03 MED ORDER — CITALOPRAM HYDROBROMIDE 20 MG PO TABS: ORAL_TABLET | ORAL | 1 refills | Status: DC

## 2020-08-03 NOTE — Progress Notes (Signed)
Established Patient Office Visit  Subjective:  Patient ID: Dakota Gibbs, male    DOB: [birth year]  Age: 70 y.o. MRN: [id number]  CC:  Chief Complaint  Patient presents with  . Follow-up    Medication follow-up citalopram, pt is requesting all Rx's be changed to 90 days     HPI Dakota Gibbs presents for follow-up of multiple medical issues with multiple new complaints.  He has been doing well with the Lexapro.  He would like to continue it he had all zeros on his PHQ-9.  Continues follow-up for hypercholesterolemia, hypertension and diabetes.  Blood pressure and cholesterol appear to be well controlled.  He is taking no medicines currently for his diabetes.  He reports a cough over the last 2 to 3 months.  Continues to smoke 2 or 3 cigars weekly.  He has been having some left lower quadrant pain.  Last colonoscopy was a year ago.  He is having difficulty with ED.  Has not been able to maintain an erection even with Viagra use.  He is having a decreased force of urine stream, post void dribbling.  He is having paresthesias in the bottom of his feet whenever he tries to exercise.  He does have a history of an hnp at what sounds like L5-S1.  He has paresthesias in his fingers.  He is on the computer often at his job.  He continues to work half-time.  Continues to drink 3 glasses of wine nightly.  Past Medical History:  Diagnosis Date  . Allergy    enviornmental  . Anxiety   . Anxiety and depression 10/25/2012  . Arthritis    self dx  . Back pain    L4-L5 bulging disc, L5-S1 - bulging disc, pinched nerve in neck  . CAD (coronary artery disease)      Dr Claiborne Billings, s/p stents----- sees cards yearly   . Cancer ([place])    basal cell; carcinoma removed x3 and SCC  . Cataract    bilateral sx  . Depression   . Diabetes mellitus    pt denies  . GERD (gastroesophageal reflux disease)    on meds  . History of shingles 2009  . Hyperlipidemia    on meds  . Hypertension    on meds  .  Myocardial infarction ([place]) 1997   caused by a blood clot  . Sleep apnea    not have a CPAP as of 02/27/2020-just dx    Past Surgical History:  Procedure Laterality Date  . ANGIOPLASTY  1997  . basel cell     skin carcinoma removed x 4  . CARDIAC CATHETERIZATION     10/04/1999  . COLONOSCOPY  2018   SA-MAC-suprep(good)-TA  . DOPPLER ECHOCARDIOGRAPHY  07/09/2006   EF 50 to 55 %, LA mildy dilated  . HERNIA REPAIR  1951   RIGHT  . KNEE ARTHROSCOPY Right 2001   R  . NM MYOCAR PERF WALL MOTION  08/22/2011   Mets 13,low risk study  . POLYPECTOMY  2018   TA  . WRIST SURGERY  1984   right    Family History  Problem Relation Age of Onset  . Stroke Mother        TIA  . COPD Mother   . Hypertension Sister   . Prostate cancer Father 58  . Coronary artery disease Father        GM  . Diabetes Brother   . Prostate cancer Brother 53  . Diabetes  Maternal Grandmother   . Colon cancer Neg Hx   . Esophageal cancer Neg Hx   . Colon polyps Neg Hx   . Rectal cancer Neg Hx   . Stomach cancer Neg Hx     Social History   Socioeconomic History  . Marital status: Married    Spouse name: Not on file  . Number of children: 2  . Years of education: Not on file  . Highest education level: Not on file  Occupational History  . Occupation: Engineer, manufacturing   Tobacco Use  . Smoking status: Light Tobacco Smoker    Types: Cigars    Last attempt to quit: 03/19/1992    Years since quitting: 28.3  . Smokeless tobacco: Never Used  . Tobacco comment: 1 cigar per month  Vaping Use  . Vaping Use: Never used  Substance and Sexual Activity  . Alcohol use: Yes    Alcohol/week: 21.0 standard drinks    Types: 21 Glasses of wine per week    Comment:  3 glasses wine / night  . Drug use: No  . Sexual activity: Not on file  Other Topics Concern  . Not on file  Social History Narrative   Lives w/ wife            Social Determinants of Health   Financial Resource Strain: Not on file  Food  Insecurity: Not on file  Transportation Needs: Not on file  Physical Activity: Not on file  Stress: Not on file  Social Connections: Not on file  Intimate Partner Violence: Not on file    Outpatient Medications Prior to Visit  Medication Sig Dispense Refill  . amLODipine (NORVASC) 5 MG tablet TAKE 2 TABLETS EVERY DAY 180 tablet 2  . aspirin EC 81 MG tablet Take 1 tablet (81 mg total) by mouth daily. 90 tablet 3  . atorvastatin (LIPITOR) 40 MG tablet TAKE 1 TABLET EVERY DAY AT 6 PM 90 tablet 3  . cetirizine (ZYRTEC) 10 MG tablet Take 10 mg by mouth daily.    . citalopram (CELEXA) 20 MG tablet TAKE 1 AND 1/2 TABLETS EVERY DAY (NEED MD APPOINTMENT FOR REFILLS) 45 tablet 0  . diphenhydrAMINE (BENADRYL) 25 mg capsule     . ezetimibe (ZETIA) 10 MG tablet Take 1 tablet (10 mg total) by mouth daily. 90 tablet 3  . FLUTICASONE PROPIONATE, NASAL, NA     . hydrochlorothiazide (MICROZIDE) 12.5 MG capsule TAKE 1 CAPSULE EVERY DAY 90 capsule 2  . lisinopril (ZESTRIL) 20 MG tablet Take 1 tablet (20 mg total) by mouth daily. 90 tablet 3  . metoprolol succinate (TOPROL-XL) 25 MG 24 hr tablet Take 0.5 tablets (12.5 mg total) by mouth daily. Take with or immediately following a meal. 90 tablet 1  . metoprolol tartrate (LOPRESSOR) 25 MG tablet TAKE 1/2 TO 1 TABLET EVERY DAY  AS NEEDED (HEART PALPITATIONS). 90 tablet 3  . Multiple Vitamin (MULTIVITAMIN) tablet Take 1 tablet by mouth daily.    . nitroGLYCERIN (NITROSTAT) 0.4 MG SL tablet Place 1 tablet (0.4 mg total) under the tongue every 5 (five) minutes as needed for chest pain. 25 tablet 1  . sildenafil (VIAGRA) 100 MG tablet Take 100 mg by mouth daily as needed.    . vitamin C (ASCORBIC ACID) 500 MG tablet Take 500 mg by mouth daily.    . vitamin E 400 UNIT capsule Take 400 Units by mouth daily.    Marland Kitchen azelastine (ASTELIN) 0.1 % nasal spray Place 2 sprays  into both nostrils 2 (two) times daily as needed for rhinitis or allergies. Use in each nostril as  directed 90 mL 3  . famotidine (PEPCID) 20 MG tablet Take 20 mg by mouth daily.     No facility-administered medications prior to visit.    No Known Allergies  ROS Review of Systems  Constitutional: Negative.   HENT: Negative.   Eyes: Negative for photophobia.  Respiratory: Positive for cough. Negative for chest tightness and wheezing.   Cardiovascular: Negative.   Gastrointestinal: Positive for abdominal pain. Negative for anal bleeding and blood in stool.  Endocrine: Negative for polyphagia and polyuria.  Genitourinary: Positive for difficulty urinating and frequency. Negative for decreased urine volume.  Musculoskeletal: Positive for back pain.  Allergic/Immunologic: Negative for immunocompromised state.  Neurological: Positive for numbness. Negative for weakness.  Psychiatric/Behavioral: Negative for dysphoric mood. The patient is not nervous/anxious.       Objective:    Physical Exam Vitals and nursing note reviewed.  Constitutional:      General: He is not in acute distress.    Appearance: Normal appearance. He is not ill-appearing, toxic-appearing or diaphoretic.  HENT:     Head: Normocephalic and atraumatic.     Right Ear: Tympanic membrane, ear canal and external ear normal.     Left Ear: Tympanic membrane, ear canal and external ear normal.     Mouth/Throat:     Mouth: Mucous membranes are moist.     Pharynx: Oropharynx is clear. No oropharyngeal exudate or posterior oropharyngeal erythema.  Eyes:     General: No scleral icterus.    Extraocular Movements: Extraocular movements intact.     Conjunctiva/sclera: Conjunctivae normal.     Pupils: Pupils are equal, round, and reactive to light.  Cardiovascular:     Rate and Rhythm: Normal rate and regular rhythm.  Pulmonary:     Effort: Pulmonary effort is normal.     Breath sounds: Normal breath sounds. No wheezing, rhonchi or rales.  Abdominal:     General: Bowel sounds are normal. There is no distension.      Palpations: There is no mass.     Tenderness: There is no abdominal tenderness. There is no guarding or rebound.     Hernia: A hernia is present. Hernia is present in the ventral area.  Genitourinary:    Prostate: Enlarged. Not tender and no nodules present.     Rectum: Guaiac result negative. No mass, tenderness, anal fissure, external hemorrhoid or internal hemorrhoid. Normal anal tone.  Musculoskeletal:     Cervical back: No rigidity or tenderness.  Lymphadenopathy:     Cervical: No cervical adenopathy.  Skin:    General: Skin is warm and dry.  Neurological:     Mental Status: He is alert and oriented to person, place, and time.  Psychiatric:        Mood and Affect: Mood normal.        Behavior: Behavior normal.     BP 140/68 (BP Location: Left Arm, Patient Position: Sitting, Cuff Size: Normal)   Pulse (!) 54   Temp (!) 97 F (36.1 C) (Temporal)   Wt 200 lb 3.2 oz (90.8 kg)   SpO2 96%   BMI 28.73 kg/m  Wt Readings from Last 3 Encounters:  08/03/20 200 lb 3.2 oz (90.8 kg)  05/08/20 200 lb 6.4 oz (90.9 kg)  03/12/20 197 lb (89.4 kg)     Health Maintenance Due  Topic Date Due  . Hepatitis C Screening  Never  done  . FOOT EXAM  02/04/2017  . OPHTHALMOLOGY EXAM  10/20/2019  . COVID-19 Vaccine (4 - Booster for Pfizer series) 07/02/2020    There are no preventive care reminders to display for this patient.  Lab Results  Component Value Date   TSH 2.150 04/25/2020   Lab Results  Component Value Date   WBC 7.2 04/25/2020   HGB 14.4 04/25/2020   HCT 43.6 04/25/2020   MCV 95 04/25/2020   PLT 247 04/25/2020   Lab Results  Component Value Date   NA 142 05/08/2020   K 5.0 05/08/2020   CO2 22 05/08/2020   GLUCOSE 107 (H) 05/08/2020   BUN 10 05/08/2020   CREATININE 0.84 05/08/2020   BILITOT 0.7 04/25/2020   ALKPHOS 95 04/25/2020   AST 25 04/25/2020   ALT 27 04/25/2020   PROT 6.8 04/25/2020   ALBUMIN 4.3 04/25/2020   CALCIUM 9.1 05/08/2020   GFR 87.74  07/22/2019   Lab Results  Component Value Date   CHOL 157 04/25/2020   Lab Results  Component Value Date   HDL 77 04/25/2020   Lab Results  Component Value Date   LDLCALC 54 04/25/2020   Lab Results  Component Value Date   TRIG 158 (H) 04/25/2020   Lab Results  Component Value Date   CHOLHDL 2.0 04/25/2020   Lab Results  Component Value Date   HGBA1C 6.2 (H) 04/25/2020      Assessment & Plan:   Problem List Items Addressed This Visit      Cardiovascular and Mediastinum   Essential hypertension   Relevant Orders   CBC   Basic metabolic panel     Endocrine   Type 2 diabetes mellitus with diabetic polyneuropathy (HCC) - Primary   Relevant Orders   Hemoglobin A1c   Microalbumin / creatinine urine ratio   Basic metabolic panel     Genitourinary   Benign prostatic hyperplasia with post-void dribbling   Relevant Orders   PSA     Other   Anxiety and depression   Hyperlipidemia with target LDL less than 70   Relevant Orders   Lipid panel   Alcohol use   Relevant Orders   CBC   Hepatic function panel   Tobacco use   Relevant Orders   DG Chest 2 View   Cough   Relevant Orders   DG Chest 2 View   Lower abdominal pain   Relevant Orders   CBC   Urinalysis, Routine w reflex microscopic   Basic metabolic panel   Hepatic function panel   Paresthesias   Relevant Orders   B12 and Folate Panel   Anemia   Relevant Orders   TSH   Iron, TIBC and Ferritin Panel      No orders of the defined types were placed in this encounter.   Follow-up: Return in about 2 weeks (around 08/17/2020).   There was much to cover today in the office visit.  He will follow-up in a few weeks and we will rediscuss his abdominal pain, BPH symptoms, cough with cigar usage and paresthesias in his feet and hands.  Labs were drawn today. Libby Maw, MD

## 2020-08-04 LAB — IRON,TIBC AND FERRITIN PANEL
%SAT: 40 % (calc) (ref 20–48)
Ferritin: 215 ng/mL (ref 24–380)
Iron: 124 ug/dL (ref 50–180)
TIBC: 313 mcg/dL (calc) (ref 250–425)

## 2020-08-08 ENCOUNTER — Other Ambulatory Visit: Payer: Self-pay | Admitting: Cardiovascular Disease

## 2020-08-08 ENCOUNTER — Other Ambulatory Visit: Payer: Self-pay | Admitting: Family Medicine

## 2020-08-08 DIAGNOSIS — F32A Depression, unspecified: Secondary | ICD-10-CM

## 2020-08-08 DIAGNOSIS — F419 Anxiety disorder, unspecified: Secondary | ICD-10-CM

## 2020-08-27 ENCOUNTER — Other Ambulatory Visit: Payer: Self-pay

## 2020-08-27 ENCOUNTER — Ambulatory Visit (INDEPENDENT_AMBULATORY_CARE_PROVIDER_SITE_OTHER): Payer: Medicare Other | Admitting: Family Medicine

## 2020-08-27 ENCOUNTER — Encounter: Payer: Self-pay | Admitting: Family Medicine

## 2020-08-27 VITALS — BP 126/64 | HR 55 | Temp 97.8°F | Ht 70.0 in | Wt 202.0 lb

## 2020-08-27 DIAGNOSIS — R058 Other specified cough: Secondary | ICD-10-CM | POA: Diagnosis not present

## 2020-08-27 DIAGNOSIS — F528 Other sexual dysfunction not due to a substance or known physiological condition: Secondary | ICD-10-CM

## 2020-08-27 DIAGNOSIS — T464X5A Adverse effect of angiotensin-converting-enzyme inhibitors, initial encounter: Secondary | ICD-10-CM | POA: Diagnosis not present

## 2020-08-27 DIAGNOSIS — I10 Essential (primary) hypertension: Secondary | ICD-10-CM

## 2020-08-27 DIAGNOSIS — E1142 Type 2 diabetes mellitus with diabetic polyneuropathy: Secondary | ICD-10-CM

## 2020-08-27 MED ORDER — OLMESARTAN MEDOXOMIL 20 MG PO TABS: 20.0000 mg | ORAL_TABLET | Freq: Every day | ORAL | 0 refills | Status: DC

## 2020-08-27 MED ORDER — METFORMIN HCL ER 500 MG PO TB24: 500.0000 mg | ORAL_TABLET | Freq: Every day | ORAL | 1 refills | Status: DC

## 2020-08-27 MED ORDER — TADALAFIL 20 MG PO TABS: ORAL_TABLET | ORAL | 3 refills | Status: DC

## 2020-08-27 NOTE — Progress Notes (Signed)
Established Patient Office Visit  Subjective:  Patient ID: Dakota Gibbs, male    DOB: 1951/04/22  Age: 70 y.o. MRN: [id number]  CC: No chief complaint on file.   HPI Dakota Gibbs presents for follow-up of elevated hemoglobin A1c, cough and hypertension.  Patient reports a 54-monthhistory of cough is nonproductive and without fever.  He denies wheezing.  Reflux is controlled with omeprazole.  He has no history of asthma or COPD.  He does smoke 3 to 4 cigars weekly.  He has no post nasal drip but does take cetirizine for allergy rhinitis symptoms.  Has gained weight.  Denies increased frequency of urination.  He had tried Revatio for ED and it caused malaise.  Fortunately his memory needed to use his nitroglycerin.  Past Medical History:  Diagnosis Date  . Allergy    enviornmental  . Anxiety   . Anxiety and depression 10/25/2012  . Arthritis    self dx  . Back pain    L4-L5 bulging disc, L5-S1 - bulging disc, pinched nerve in neck  . CAD (coronary artery disease)      Dr Dakota Gibbs s/p stents----- sees cards yearly   . Cancer ([facility]akota Gibbs    basal cell; carcinoma removed x3 and SCC  . Cataract    bilateral sx  . Depression   . Diabetes mellitus    pt denies  . GERD (gastroesophageal reflux disease)    on meds  . History of shingles 2009  . Hyperlipidemia    on meds  . Hypertension    on meds  . [place]   caused by a blood clot  . Sleep apnea    not have a CPAP as of 02/27/2020-just dx    Past Surgical History:  Procedure Laterality Date  . ANGIOPLASTY  1997  . basel cell     skin carcinoma removed x 4  . CARDIAC CATHETERIZATION     10/04/1999  . COLONOSCOPY  2018   SA-MAC-suprep(good)-TA  . DOPPLER ECHOCARDIOGRAPHY  07/09/2006   EF 50 to 55 %, LA mildy dilated  . HERNIA REPAIR  1951   RIGHT  . KNEE ARTHROSCOPY Right 2001   R  . NM MYOCAR PERF WALL MOTION  08/22/2011   Mets 13,low risk study  . POLYPECTOMY  2018   TA  . WRIST SURGERY   1984   right    Family History  Problem Relation Age of Onset  . Stroke Mother        TIA  . COPD Mother   . Hypertension Sister   . Prostate cancer Father 782 . Coronary artery disease Father        GM  . Diabetes Brother   . Prostate cancer Brother 66 . Diabetes Maternal Grandmother   . Colon cancer Neg Hx   . Esophageal cancer Neg Hx   . Colon polyps Neg Hx   . Rectal cancer Neg Hx   . Stomach cancer Neg Hx     Social History   Socioeconomic History  . Marital status: Married    Spouse name: Not on file  . Number of children: 2  . Years of education: Not on file  . Highest education level: Not on file  Occupational History  . Occupation: eEngineer, manufacturing  Tobacco Use  . Smoking status: Light Tobacco Smoker    Types: Cigars    Last attempt to quit: 03/19/1992    Years since quitting: 28.4  .  Smokeless tobacco: Never Used  . Tobacco comment: 1 cigar per month  Vaping Use  . Vaping Use: Never used  Substance and Sexual Activity  . Alcohol use: Yes    Alcohol/week: 21.0 standard drinks    Types: 21 Glasses of wine per week    Comment:  3 glasses wine / night  . Drug use: No  . Sexual activity: Not on file  Other Topics Concern  . Not on file  Social History Narrative   Lives w/ wife            Social Determinants of Health   Financial Resource Strain: Not on file  Food Insecurity: Not on file  Transportation Needs: Not on file  Physical Activity: Not on file  Stress: Not on file  Social Connections: Not on file  Intimate Partner Violence: Not on file    Outpatient Medications Prior to Visit  Medication Sig Dispense Refill  . amLODipine (NORVASC) 5 MG tablet TAKE 2 TABLETS EVERY DAY 180 tablet 2  . aspirin EC 81 MG tablet Take 1 tablet (81 mg total) by mouth daily. 90 tablet 3  . atorvastatin (LIPITOR) 40 MG tablet TAKE 1 TABLET EVERY DAY AT 6 PM 90 tablet 3  . cetirizine (ZYRTEC) 10 MG tablet Take 10 mg by mouth daily.    . citalopram  (CELEXA) 20 MG tablet TAKE 1 AND 1/2 TABLETS EVERY DAY (NEED MD APPOINTMENT FOR REFILLS) 45 tablet 1  . diphenhydrAMINE (BENADRYL) 25 mg capsule     . ezetimibe (ZETIA) 10 MG tablet Take 1 tablet (10 mg total) by mouth daily. 90 tablet 3  . FLUTICASONE PROPIONATE, NASAL, NA     . hydrochlorothiazide (MICROZIDE) 12.5 MG capsule TAKE 1 CAPSULE EVERY DAY 90 capsule 2  . metoprolol succinate (TOPROL-XL) 25 MG 24 hr tablet Take 0.5 tablets (12.5 mg total) by mouth daily. Take with or immediately following a meal. 90 tablet 1  . Multiple Vitamin (MULTIVITAMIN) tablet Take 1 tablet by mouth daily.    . nitroGLYCERIN (NITROSTAT) 0.4 MG SL tablet Place 1 tablet (0.4 mg total) under the tongue every 5 (five) minutes as needed for chest pain. 25 tablet 1  . oxymetazoline (AFRIN) 0.05 % nasal spray Place 1 spray into both nostrils daily.    . vitamin C (ASCORBIC ACID) 500 MG tablet Take 500 mg by mouth daily.    . vitamin E 400 UNIT capsule Take 400 Units by mouth daily.    Marland Kitchen lisinopril (ZESTRIL) 20 MG tablet TAKE 1 TABLET EVERY DAY 90 tablet 3  . sildenafil (VIAGRA) 100 MG tablet Take 100 mg by mouth daily as needed.    . metoprolol tartrate (LOPRESSOR) 25 MG tablet TAKE 1/2 TO 1 TABLET EVERY DAY  AS NEEDED (HEART PALPITATIONS). 90 tablet 3   No facility-administered medications prior to visit.    No Known Allergies  ROS Review of Systems  Constitutional: Negative for diaphoresis, fatigue, fever and unexpected weight change.  HENT: Negative for postnasal drip.   Eyes: Negative for photophobia and visual disturbance.  Respiratory: Positive for choking. Negative for chest tightness, shortness of breath and wheezing.   Cardiovascular: Negative for chest pain and palpitations.  Gastrointestinal: Negative.   Endocrine: Negative for polyphagia and polyuria.  Musculoskeletal: Negative for gait problem.  Neurological: Negative for speech difficulty and weakness.  Psychiatric/Behavioral: Negative.        Objective:    Physical Exam Constitutional:      General: He is not in acute  distress.    Appearance: Normal appearance. He is not ill-appearing, toxic-appearing or diaphoretic.  HENT:     Head: Normocephalic and atraumatic.     Right Ear: Tympanic membrane, ear canal and external ear normal.     Left Ear: Tympanic membrane, ear canal and external ear normal.     Mouth/Throat:     Mouth: Mucous membranes are moist.     Pharynx: No oropharyngeal exudate or posterior oropharyngeal erythema.  Eyes:     General: No scleral icterus.       Right eye: No discharge.        Left eye: No discharge.     Extraocular Movements: Extraocular movements intact.     Conjunctiva/sclera: Conjunctivae normal.     Pupils: Pupils are equal, round, and reactive to light.  Neck:     Vascular: No carotid bruit.  Cardiovascular:     Rate and Rhythm: Normal rate and regular rhythm.  Pulmonary:     Effort: Pulmonary effort is normal. No respiratory distress.     Breath sounds: Normal breath sounds. No wheezing, rhonchi or rales.  Abdominal:     General: Bowel sounds are normal.  Musculoskeletal:     Cervical back: No rigidity or tenderness.     Right lower leg: No edema.     Left lower leg: No edema.  Lymphadenopathy:     Cervical: No cervical adenopathy.  Skin:    General: Skin is dry.  Neurological:     Mental Status: He is alert and oriented to person, place, and time.  Psychiatric:        Mood and Affect: Mood normal.        Behavior: Behavior normal.     BP 126/64   Pulse (!) 55   Temp 97.8 F (36.6 C) (Temporal)   Ht _0  (1.778 m)   Wt 202 lb (91.6 kg)   SpO2 95%   BMI 28.98 kg/m  Wt Readings from Last 3 Encounters:  08/27/20 202 lb (91.6 kg)  08/03/20 200 lb 3.2 oz (90.8 kg)  05/08/20 200 lb 6.4 oz (90.9 kg)     Health Maintenance Due  Topic Date Due  . Hepatitis C Screening  Never done  . FOOT EXAM  02/04/2017  . OPHTHALMOLOGY EXAM  10/20/2019    There are no  preventive care reminders to display for this patient.  Lab Results  Component Value Date   TSH 1.44 08/03/2020   Lab Results  Component Value Date   WBC 6.0 08/03/2020   HGB 14.3 08/03/2020   HCT 41.9 08/03/2020   MCV 93.0 08/03/2020   PLT 197.0 08/03/2020   Lab Results  Component Value Date   NA 138 08/03/2020   K 4.7 08/03/2020   CO2 28 08/03/2020   GLUCOSE 115 (H) 08/03/2020   BUN 17 08/03/2020   CREATININE 0.92 08/03/2020   BILITOT 0.5 08/03/2020   ALKPHOS 89 08/03/2020   AST 22 08/03/2020   ALT 28 08/03/2020   PROT 6.9 08/03/2020   ALBUMIN 4.0 08/03/2020   CALCIUM 9.1 08/03/2020   GFR 83.56 08/03/2020   Lab Results  Component Value Date   CHOL 152 08/03/2020   Lab Results  Component Value Date   HDL 60.50 08/03/2020   Lab Results  Component Value Date   LDLCALC 58 08/03/2020   Lab Results  Component Value Date   TRIG 165.0 (H) 08/03/2020   Lab Results  Component Value Date   CHOLHDL 3 08/03/2020  Lab Results  Component Value Date   HGBA1C 6.5 08/03/2020      Assessment & Plan:   Problem List Items Addressed This Visit      Cardiovascular and Mediastinum   Essential hypertension   Relevant Medications   olmesartan (BENICAR) 20 MG tablet   tadalafil (CIALIS) 20 MG tablet     Endocrine   Type 2 diabetes mellitus with diabetic polyneuropathy (HCC) - Primary   Relevant Medications   olmesartan (BENICAR) 20 MG tablet   metFORMIN (GLUCOPHAGE XR) 500 MG 24 hr tablet     Other   ERECTILE DYSFUNCTION   Relevant Medications   tadalafil (CIALIS) 20 MG tablet   Cough due to ACE inhibitor      Meds ordered this encounter  Medications  . olmesartan (BENICAR) 20 MG tablet    Sig: Take 1 tablet (20 mg total) by mouth daily.    Dispense:  90 tablet    Refill:  0  . tadalafil (CIALIS) 20 MG tablet    Sig: May take one or two up to every other day as needed for ed.    Dispense:  10 tablet    Refill:  3  . metFORMIN (GLUCOPHAGE XR) 500 MG  24 hr tablet    Sig: Take 1 tablet (500 mg total) by mouth daily with breakfast.    Dispense:  90 tablet    Refill:  1    Follow-up: Return in about 3 months (around 11/26/2020).  Discontinued lisinopril and will start olmesartan.  He will check and record his blood pressures.  We will start Glucophage Exar.  Asked him to look out for gastrointestinal side effects.  Assured him that oftentimes these clear after a few weeks.  Was given a trial prescription for Cialis.  We discussed the extended half-life of Cialis and that he cannot take it within 24 hours of taking nitroglycerin.  He was given information on all 3 drugs.  Follow-up in 3 months.  Libby Maw, MD

## 2020-08-27 NOTE — Patient Instructions (Signed)
Olmesartan Tablets What is this medicine? OLMESARTAN (all mi SAR tan) is an angiotensin II receptor blocker, also known as an ARB. It treats high blood pressure. This medicine may be used for other purposes; ask your health care provider or pharmacist if you have questions. COMMON BRAND NAME(S): Benicar What should I tell my health care provider before I take this medicine? They need to know if you have any of these conditions:  if you are on a special diet, such as a low-salt diet  kidney or liver disease  an unusual or allergic reaction to olmesartan, other medicines, foods, dyes, or preservatives  pregnant or trying to get pregnant  breast-feeding How should I use this medicine? Take this drug by mouth. Take it as directed on the prescription label at the same time every day. You can take it with or without food. If it upsets your stomach, take it with food. Keep taking it unless your health care provider tells you to stop. Talk to your health care provider about the use of this drug in children. While it may be prescribed for children as young as 6 for selected conditions, precautions do apply. Overdosage: If you think you have taken too much of this medicine contact a poison control center or emergency room at once. NOTE: This medicine is only for you. Do not share this medicine with others. What if I miss a dose? If you miss a dose, take it as soon as you can. If it is almost time for your next dose, take only that dose. Do not take double or extra doses. What may interact with this medicine?  blood pressure medicines  diuretics, especially triamterene, spironolactone or amiloride  potassium salts or potassium supplements This list may not describe all possible interactions. Give your health care provider a list of all the medicines, herbs, non-prescription drugs, or dietary supplements you use. Also tell them if you smoke, drink alcohol, or use illegal drugs. Some items may  interact with your medicine. What should I watch for while using this medicine? Visit your doctor or health care professional for regular checks on your progress. Check your blood pressure as directed. Ask your doctor or health care professional what your blood pressure should be and when you should contact him or her. Call your doctor or health care professional if you notice an irregular or fast heart beat. Women should inform their doctor if they wish to become pregnant or think they might be pregnant. There is a potential for serious side effects to an unborn child, particularly in the second or third trimester. Talk to your health care professional or pharmacist for more information. You may get drowsy or dizzy. Do not drive, use machinery, or do anything that needs mental alertness until you know how this drug affects you. Do not stand or sit up quickly, especially if you are an older patient. This reduces the risk of dizzy or fainting spells. Alcohol can make you more drowsy and dizzy. Avoid alcoholic drinks. Avoid salt substitutes unless you are told otherwise by your doctor or health care professional. Do not treat yourself for coughs, colds, or pain while you are taking this medicine without asking your doctor or health care professional for advice. Some ingredients may increase your blood pressure. What side effects may I notice from receiving this medicine? Side effects that you should report to your doctor or health care professional as soon as possible:  confusion, dizziness, light headedness or fainting spells  decreased  amount of urine passed  diarrhea  difficulty breathing or swallowing, hoarseness, or tightening of the throat  fast or irregular heart beat, palpitations, or chest pain  skin rash, itching  swelling of your face, lips, tongue, hands, or feet  vomiting  weight loss Side effects that usually do not require medical attention (report to your doctor or health  care professional if they continue or are bothersome):  cough  decreased sexual function or desire  headache  nasal congestion or stuffiness  nausea  sore or cramping muscles This list may not describe all possible side effects. Call your doctor for medical advice about side effects. You may report side effects to FDA at 1-800-FDA-1088. Where should I keep my medicine? Keep out of the reach of children and pets. Store at room temperature between 20 and 25 degrees C (68 and 77 degrees F). Throw away any unused drug after the expiration date. NOTE: This sheet is a summary. It may not cover all possible information. If you have questions about this medicine, talk to your doctor, pharmacist, or health care provider.  2021 [employer]vier/Gold Standard (2018-11-24 13:23:55) Metformin Extended-Release Tablets What is this medicine? METFORMIN (met FOR min) is used to treat type 2 diabetes. It helps to control blood sugar. Treatment is combined with diet and exercise. This medicine can be used alone or with other medicines for diabetes. This medicine may be used for other purposes; ask your health care provider or pharmacist if you have questions. COMMON BRAND NAME(S): Fortamet, Glucophage XR, Glumetza What should I tell my health care provider before I take this medicine? They need to know if you have any of these conditions:  anemia  dehydration  heart disease  frequently drink alcohol-containing beverages  kidney disease  liver disease  polycystic ovary syndrome  serious infection or injury  vomiting  an unusual or allergic reaction to metformin, other medicines, foods, dyes, or preservatives  pregnant or trying to get pregnant  breast-feeding How should I use this medicine? Take this medicine by mouth with a glass of water. Follow the directions on the prescription label. Take this medicine with food. Take your medicine at regular intervals. Do not take your medicine more  often than directed. Do not stop taking except on your doctor's advice. Talk to your pediatrician regarding the use of this medicine in children. Special care may be needed. Overdosage: If you think you have taken too much of this medicine contact a poison control center or emergency room at once. NOTE: This medicine is only for you. Do not share this medicine with others. What if I miss a dose? If you miss a dose, take it as soon as you can. If it is almost time for your next dose, take only that dose. Do not take double or extra doses. What may interact with this medicine? Do not take this medicine with any of the following medications:  certain contrast medicines given before X-rays, CT scans, MRI, or other procedures  dofetilide This medicine may also interact with the following medications:  acetazolamide  alcohol  certain antivirals for HIV or hepatitis  certain medicines for blood pressure, heart disease, irregular heart beat  cimetidine  dichlorphenamide  digoxin  diuretics  male hormones, like estrogens or progestins and birth control pills  glycopyrrolate  isoniazid  lamotrigine  memantine  methazolamide  metoclopramide  midodrine  niacin  phenothiazines like chlorpromazine, mesoridazine, prochlorperazine, thioridazine  phenytoin  ranolazine  steroid medicines like prednisone or cortisone  stimulant  medicines for attention disorders, weight loss, or to stay awake  thyroid medicines  topiramate  trospium  vandetanib  zonisamide This list may not describe all possible interactions. Give your health care provider a list of all the medicines, herbs, non-prescription drugs, or dietary supplements you use. Also tell them if you smoke, drink alcohol, or use illegal drugs. Some items may interact with your medicine. What should I watch for while using this medicine? Visit your doctor or health care professional for regular checks on your  progress. A test called the HbA1C (A1C) will be monitored. This is a simple blood test. It measures your blood sugar control over the last 2 to 3 months. You will receive this test every 3 to 6 months. Learn how to check your blood sugar. Learn the symptoms of low and high blood sugar and how to manage them. Always carry a quick-source of sugar with you in case you have symptoms of low blood sugar. Examples include hard sugar candy or glucose tablets. Make sure others know that you can choke if you eat or drink when you develop serious symptoms of low blood sugar, such as seizures or unconsciousness. They must get medical help at once. Tell your doctor or health care professional if you have high blood sugar. You might need to change the dose of your medicine. If you are sick or exercising more than usual, you might need to change the dose of your medicine. Do not skip meals. Ask your doctor or health care professional if you should avoid alcohol. Many nonprescription cough and cold products contain sugar or alcohol. These can affect blood sugar. This medicine may cause ovulation in premenopausal women who do not have regular monthly periods. This may increase your chances of becoming pregnant. You should not take this medicine if you become pregnant or think you may be pregnant. Talk with your doctor or health care professional about your birth control options while taking this medicine. Contact your doctor or health care professional right away if you think you are pregnant. The tablet shell for some brands of this medicine does not dissolve. This is normal. The tablet shell may appear whole in the stool. This is not a cause for concern. If you are going to need surgery, a MRI, CT scan, or other procedure, tell your doctor that you are taking this medicine. You may need to stop taking this medicine before the procedure. Wear a medical ID bracelet or chain, and carry a card that describes your disease and  details of your medicine and dosage times. This medicine may cause a decrease in folic acid and vitamin B12. You should make sure that you get enough vitamins while you are taking this medicine. Discuss the foods you eat and the vitamins you take with your health care professional. What side effects may I notice from receiving this medicine? Side effects that you should report to your doctor or health care professional as soon as possible:  allergic reactions like skin rash, itching or hives, swelling of the face, lips, or tongue  breathing problems  feeling faint or lightheaded, falls  muscle aches or pains  signs and symptoms of low blood sugar such as feeling anxious, confusion, dizziness, increased hunger, unusually weak or tired, sweating, shakiness, cold, irritable, headache, blurred vision, fast heartbeat, loss of consciousness  slow or irregular heartbeat  unusual stomach pain or discomfort  unusually tired or weak Side effects that usually do not require medical attention (report to your  doctor or health care professional if they continue or are bothersome):  diarrhea  headache  heartburn  metallic taste in mouth  nausea  stomach gas, upset This list may not describe all possible side effects. Call your doctor for medical advice about side effects. You may report side effects to FDA at 1-800-FDA-1088. Where should I keep my medicine? Keep out of the reach of children. Store at room temperature between 15 and 30 degrees C (59 and 86 degrees F). Protect from light. Throw away any unused medicine after the expiration date. NOTE: This sheet is a summary. It may not cover all possible information. If you have questions about this medicine, talk to your doctor, pharmacist, or health care provider.  2021 [employer]/Gold Standard (2020-03-18 10:29:57) Tadalafil tablets (Cialis) What is this medicine? TADALAFIL (tah DA la fil) is used to treat erection problems in men. It is  also used for enlargement of the prostate gland in men, a condition called benign prostatic hyperplasia or BPH. This medicine improves urine flow and reduces BPH symptoms. This medicine can also treat both erection problems and BPH when they occur together. This medicine may be used for other purposes; ask your health care provider or pharmacist if you have questions. COMMON BRAND NAME(S): Kathaleen Bury, Cialis What should I tell my health care provider before I take this medicine? They need to know if you have any of these conditions:  bleeding disorders  eye or vision problems, including a rare inherited eye disease called retinitis pigmentosa  anatomical deformation of the penis, Peyronie's disease, or history of priapism (painful and prolonged erection)  heart disease, angina, a history of heart attack, irregular heart beats, or other heart problems  high or low blood pressure  history of blood diseases, like sickle cell anemia or leukemia  history of stomach bleeding  kidney disease  liver disease  stroke  an unusual or allergic reaction to tadalafil, other medicines, foods, dyes, or preservatives  pregnant or trying to get pregnant  breast-feeding How should I use this medicine? Take this medicine by mouth with a glass of water. Follow the directions on the prescription label. You may take this medicine with or without meals. When this medicine is used for erection problems, your doctor may prescribe it to be taken once daily or as needed. If you are taking the medicine as needed, you may be able to have sexual activity 30 minutes after taking it and for up to 36 hours after taking it. Whether you are taking the medicine as needed or once daily, you should not take more than one dose per day. If you are taking this medicine for symptoms of benign prostatic hyperplasia (BPH) or to treat both BPH and an erection problem, take the dose once daily at about the same time each day. Do  not take your medicine more often than directed. Talk to your pediatrician regarding the use of this medicine in children. Special care may be needed. Overdosage: If you think you have taken too much of this medicine contact a poison control center or emergency room at once. NOTE: This medicine is only for you. Do not share this medicine with others. What if I miss a dose? If you are taking this medicine as needed for erection problems, this does not apply. If you miss a dose while taking this medicine once daily for an erection problem, benign prostatic hyperplasia, or both, take it as soon as you remember, but do not take more than  one dose per day. What may interact with this medicine? Do not take this medicine with any of the following medications:  nitrates like amyl nitrite, isosorbide dinitrate, isosorbide mononitrate, nitroglycerin  other medicines for erectile dysfunction like avanafil, sildenafil, vardenafil  other tadalafil products (Adcirca)  riociguat This medicine may also interact with the following medications:  certain drugs for high blood pressure  certain drugs for the treatment of HIV infection or AIDS  certain drugs used for fungal or yeast infections, like fluconazole, itraconazole, ketoconazole, and voriconazole  certain drugs used for seizures like carbamazepine, phenytoin, and phenobarbital  grapefruit juice  macrolide antibiotics like clarithromycin, erythromycin, troleandomycin  medicines for prostate problems  rifabutin, rifampin or rifapentine This list may not describe all possible interactions. Give your health care provider a list of all the medicines, herbs, non-prescription drugs, or dietary supplements you use. Also tell them if you smoke, drink alcohol, or use illegal drugs. Some items may interact with your medicine. What should I watch for while using this medicine? If you notice any changes in your vision while taking this drug, call your  doctor or health care professional as soon as possible. Stop using this medicine and call your health care provider right away if you have a loss of sight in one or both eyes. Contact your doctor or health care professional right away if the erection lasts longer than 4 hours or if it becomes painful. This may be a sign of serious problem and must be treated right away to prevent permanent damage. If you experience symptoms of nausea, dizziness, chest pain or arm pain upon initiation of sexual activity after taking this medicine, you should refrain from further activity and call your doctor or health care professional as soon as possible. Do not drink alcohol to excess (examples, 5 glasses of wine or 5 shots of whiskey) when taking this medicine. When taken in excess, alcohol can increase your chances of getting a headache or getting dizzy, increasing your heart rate or lowering your blood pressure. Using this medicine does not protect you or your partner against HIV infection (the virus that causes AIDS) or other sexually transmitted diseases. What side effects may I notice from receiving this medicine? Side effects that you should report to your doctor or health care professional as soon as possible:  allergic reactions like skin rash, itching or hives, swelling of the face, lips, or tongue  breathing problems  changes in hearing  changes in vision  chest pain  fast, irregular heartbeat  prolonged or painful erection  seizures Side effects that usually do not require medical attention (report to your doctor or health care professional if they continue or are bothersome):  back pain  dizziness  flushing  headache  indigestion  muscle aches  nausea  stuffy or runny nose This list may not describe all possible side effects. Call your doctor for medical advice about side effects. You may report side effects to FDA at 1-800-FDA-1088. Where should I keep my medicine? Keep out of  the reach of children. Store at room temperature between 15 and 30 degrees C (59 and 86 degrees F). Throw away any unused medicine after the expiration date. NOTE: This sheet is a summary. It may not cover all possible information. If you have questions about this medicine, talk to your doctor, pharmacist, or health care provider.  2021 Elsevier/Gold Standard (2013-09-09 13:15:49)

## 2020-09-13 ENCOUNTER — Telehealth: Payer: Self-pay

## 2020-09-13 NOTE — Telephone Encounter (Signed)
Komperda, Cari Caraway" to Troy Sine, MD   RH   11:05 AM I met with Dr Ethelene Hal this week and he prescribed Olmesartan to replace lisinopril, same dosage of 20 mg. Is that okay with you? Thanks  Randel Pigg

## 2020-09-14 NOTE — Telephone Encounter (Signed)
Ok to switch to olmesartan

## 2020-09-17 DIAGNOSIS — M5416 Radiculopathy, lumbar region: Secondary | ICD-10-CM | POA: Diagnosis not present

## 2020-09-17 NOTE — Telephone Encounter (Signed)
Pt made aware via my chart

## 2020-10-04 DIAGNOSIS — M25562 Pain in left knee: Secondary | ICD-10-CM | POA: Diagnosis not present

## 2020-10-10 DIAGNOSIS — M545 Low back pain, unspecified: Secondary | ICD-10-CM | POA: Diagnosis not present

## 2020-10-10 DIAGNOSIS — M5459 Other low back pain: Secondary | ICD-10-CM | POA: Diagnosis not present

## 2020-10-16 DIAGNOSIS — M5136 Other intervertebral disc degeneration, lumbar region: Secondary | ICD-10-CM | POA: Diagnosis not present

## 2020-10-20 ENCOUNTER — Other Ambulatory Visit: Payer: Self-pay | Admitting: Cardiovascular Disease

## 2020-10-28 ENCOUNTER — Other Ambulatory Visit: Payer: Self-pay | Admitting: Cardiovascular Disease

## 2020-10-29 ENCOUNTER — Telehealth: Payer: Self-pay | Admitting: Cardiovascular Disease

## 2020-10-29 NOTE — Telephone Encounter (Signed)
    Pt is returning Alisha's call

## 2020-10-29 NOTE — Telephone Encounter (Signed)
Spoke with pt. He report yesterday while working in the yard, he kept feeling dizzy, tired, and like he was going to pass out. He state he would sit down for a few minutes the return to work. After doing that for over an hour, he eventually passed out. He report BP at the time was 90/39 HR 74. Today he state he feels better and BP 105/65.   Pt requesting to move up appointment but not sooner appointments available. He also state he only want to see Dr. Claiborne Billings.   Will forward to MD to make aware. Pt also made aware of ED precaution should any new symptoms develop or worsen.

## 2020-11-06 DIAGNOSIS — M5416 Radiculopathy, lumbar region: Secondary | ICD-10-CM | POA: Diagnosis not present

## 2020-11-08 NOTE — Telephone Encounter (Signed)
We will try to arrange for an office visit he can be added onto my July 19 schedule

## 2020-11-13 NOTE — Telephone Encounter (Signed)
Spoke with pt. Appointment scheduled for 7/19 at 10 am as instructed by Dr. Claiborne Billings. Pt verbalized understanding.

## 2020-11-16 DIAGNOSIS — M5416 Radiculopathy, lumbar region: Secondary | ICD-10-CM | POA: Diagnosis not present

## 2020-11-20 ENCOUNTER — Other Ambulatory Visit: Payer: Self-pay

## 2020-11-20 ENCOUNTER — Ambulatory Visit (INDEPENDENT_AMBULATORY_CARE_PROVIDER_SITE_OTHER): Payer: Medicare Other | Admitting: Cardiovascular Disease

## 2020-11-20 ENCOUNTER — Encounter: Payer: Self-pay | Admitting: Cardiovascular Disease

## 2020-11-20 DIAGNOSIS — I10 Essential (primary) hypertension: Secondary | ICD-10-CM | POA: Diagnosis not present

## 2020-11-20 DIAGNOSIS — I2510 Atherosclerotic heart disease of native coronary artery without angina pectoris: Secondary | ICD-10-CM | POA: Diagnosis not present

## 2020-11-20 DIAGNOSIS — E785 Hyperlipidemia, unspecified: Secondary | ICD-10-CM

## 2020-11-20 DIAGNOSIS — G4733 Obstructive sleep apnea (adult) (pediatric): Secondary | ICD-10-CM

## 2020-11-20 DIAGNOSIS — I5189 Other ill-defined heart diseases: Secondary | ICD-10-CM | POA: Diagnosis not present

## 2020-11-20 MED ORDER — AMLODIPINE BESYLATE 5 MG PO TABS: 7.5000 mg | ORAL_TABLET | Freq: Every day | ORAL | 3 refills | Status: DC

## 2020-11-20 MED ORDER — HYDROCHLOROTHIAZIDE 12.5 MG PO CAPS: ORAL_CAPSULE | ORAL | 2 refills | Status: DC

## 2020-11-20 NOTE — Progress Notes (Signed)
Patient ID: [name], male   DOB: [birth year], 70 y.o.   MRN: [id number]    PCP: Dr. Kathlene November  HPI: FABIEN TRAVELSTEAD is a 70 y.o. male who presents to the office today for a 6 month cardiology and sleep evaluation.  Mr. Clemence suffered an inferior wall myocardial infarction on 05/10/1995 and underwent acute intervention to a totally occluded RCA. He had more distal disease in the posterolateral vessel which was treated medically. Several days later he underwent staged intervention to circumflex marginal vessel. His last cardiac catheterization was in 2001 which did not show restenosis and actually showed coronary artery disease regression.  He has been aggressively treated since his initial event.  When I saw him several  years ago he had noticed several episodes in the early morning while sleeping that he develops nocturnal palpitations. Upon further questioning he does snore and his snoring is more significantly abnormal particularly after drinking alcohol. He wakes up one to 2 times per night.  He was started on Celexa for anxiety/depression which has helped.  He has a history of hyperlipidemia and has been tolerating Lipitor. He also has GERD, and hypertension.   Over the past year, he admits to a 25 pound weight loss. In contrast to his previous diet many years ago when he tried the [employer], his current diet is eating less along with few carbohydrates. His weight is reduced from 208-180 3 pounds. He is sleeping significantly improved. He is not aware of any snoring.  He continues to take amlodipine 5 mg, lisinopril 20, no grams, Toprol-XL 50 mg for hypertension and CAD.  He is on Zantac 150 mg for dyspepsia/GERD. He has more energy.  Lab work done by his primary physician had shown improvement in his hemoglobin A1c at 6.0, improved from 6.6.   I  saw him in March 2018.  At that time he continues to do well an was working in the executive recruiting business.  He denied  chest pain or shortness of breath.  At times he believes he may be having some short-term memory loss.    I saw him in the clinic on a DOD on Sep 06, 2018.  However, with the COVID-19 pandemic, he had not been going to the gym.  Over the past month, he has began to notice episodes of transient lightheadedness and dizziness which typically occur during physical exertion.  He denies any frank syncope.  He denies exertional chest pain or palpitations.  He has been noticing that his blood pressure has been in the 140 to 150 range.  He has continued to be on Toprol-XL 50 mg, amlodipine 5 mg, and lisinopril 20 mg.  He is on atorvastatin for hyperlipidemia.  He continues to be on Celexa.  He continues to be an executive recruiting but his work is significantly slowed down and have him most come to a complete halt during the [place] pandemic.    During that evaluation, his ECG revealed sinus bradycardia at 43 bpm.  Laboratory also had shown elevated potassium at 5.6.  I decreased lisinopril from 20 mg down to 10 mg and titrated amlodipine to 10 mg.  I provided him information regarding potassium and certain foods and fruits.  An echo Doppler study as well as carotid duplex imaging were recommended.   Repeat laboratory several days later showed a potassium of 4.9.  Patient did realize that he was having a fair amount of grapefruit juice and a Seabreeze drink which also contributed  to his potassium elevation.  His echo Doppler study showed an EF of 60 to 65% with grade 1 diastolic dysfunction.  There was minimal increased RV systolic pressure at 25.3 mm.  There was mild right atrial dilatation.  His carotid study showed mild bilateral internal carotid plaque in the 1 to 39% range with greater than 50% ECA stenoses.  He had normal antegrade flow bilaterally in the vertebral arteries and his subclavian arteries were normal.   Since I  saw him, he has monitored his blood pressure and has average 40 readings.  He states his  blood pressure has run over 40 samples averaging 142/59.  His pulse is increased slightly now in the 50s with an average of 53.  He had undergone repeat laboratory which had shown his LDL cholesterol had risen to 85.  He denies any episodes of chest pain or shortness of breath.  However, he still notes occasional episodes of transient dizziness . He presents for evaluation  He was evaluated in a telemedicine visit on Sep 20, 2018.  At that time his blood pressure had improved.  He was on amlodipine 10 mg, reduced dose of  lisinopril at 10 mg, and Toprol-XL 25 mg.  He continued to have some episodes of dizziness and bradycardia and during that evaluation recommended reduction of Toprol-XL to 12.5 mg daily.  I reviewed his echo Doppler data as well as his carotid duplex imaging studies.  He had normal bilateral antegrade flow to his vertebrals and he did not have any high-grade carotid stenoses.  With his LDL cholesterol of 85 on atorvastatin 40 mg I added Zetia 10 mg.  Repeat potassium improved to 4.9 with discontinuance of his frequent grapefruit juice.  I recommended that he undergo an event monitor for further evaluation of potential arrhythmia.  He was monitored from June 30 through November 15, 2018.  The predominant rhythm was sinus rhythm with the fastest episode of sinus tachycardia at 110 bpm at 8:28 PM on July 4.  His slowest episode was sinus bradycardia at 46 bpm while sleeping at 3:50 AM on July 8.  There were occasional PVCs representing 1% of his abnormal rhythm.  There was 1 episode of 4 beats of multifocal ventricular tachycardia on July 3.  Otherwise PVCs were isolated.  There were no episodes of atrial fibrillation.  There were no pauses.   I saw him in August 2020 at which time his syncope had improved.  Presyncope has improved.  However he does note that he still feels episodes of some lightheadedness.  Today he planted for plants in the heat.  He also cuts rubs.  He denied any definitive  orthostatic symptoms.  He walked a mile today without chest pain or dizziness.    He was last evaluated in a telemedicine visit on March 03, 2019.  At that time his symptoms had dramatically improved.  He was no longer having significant shortness of breath with walking or chest pressure.  He still noted mild fatigability.  He was unaware of palpitations.  When I saw him in May 2021 his major complaint was that he was tired all the time.  He notes shortness of breath and fatigue particularly while walking in his garden.  He still able to walk a mile 3 to 4 days/week.  He is back at work in his executive recruiting business.  He does note some early morning palpitations typically around 5 AM.  Recently his blood pressure has been elevated and may reach  195 systolically.  I recommended dose titration of lisinopril from 10 mg up to 20 mg I also recommended he undergo a sleep study for evaluation of sleep apnea.  On October 17, 2019 he underwent his diagnostic polysomnogram.  This revealed severe obstructive sleep apnea with an AHI of 39.2, RDI of 57.4, NREM AHI of 30/h.  Events were worse with supine sleep with an AHI of 58.1.  Oxygen desaturated to a nadir of 85%.  CPAP titration was recommended.   Mr Voiles developed Covid in late July and was not hospitalized but felt markedly fatigued for 3 weeks.  He never underwent a CPAP titration trial and wanted to discuss this further with me today.  He is unaware of any palpitations or orthostatic symptoms.    When I last saw him in August 2021 strongly recommended he pursue the CPAP titration study.  I also recommended he undergo a follow-up echo Doppler study following his Covid infection to make certain there was no residual myocardial effects.  Following his Covid exposure he underwent a booster vaccination.  He was maintaining sinus rhythm and his blood pressure was stable.  His echo Doppler study was done on 01/10/2020 which showed an EF of 55 to 60% with  grade 1 diastolic dysfunction.  There is mild biatrial enlargement.  There was mild dilation of his ascending aorta millimeters.  I last saw him in January 2022.  He had undergone a CPAP titration on 02/09/2020 and was titrated up to 10 cm water pressure with AHI of 0 and O2 nadir at 92.  Mr. Mckelvin's CPAP set up date was 04/12/2020.  Since initiating therapy he has used CPAP with 100% compliance.  A download was obtained from his initiation until presently which is approximately 3 weeks.  Average usage was 9 hours and 38 minutes.  At a 10 cm set pressure, AHI was 7.2 he did have central events at 2.5/h.  He believes he is sleeping much better since initiating CPAP therapy.  He typically goes to bed at 9 PM and wakes up sometime around 8 AM.  With CPAP therapy, he felt significantly improved.  I He denied any residual daytime sleepiness.  An Epworth Sleepiness Scale score was calculated in the office and this endorsed at 4. I  made adjustments to his prescription and changed him from a set pressure to an auto pressure with a range of 10 to 15 minutes.  I also decreased his ramp time down to 5 minutes.    Since I last saw him, he states approximately 1 month ago he had a presyncopal spell and this occurred on a day that was extremely hot, he was somewhat dehydrated, and had been working significantly in his large yard.  His blood pressure dropped to 95/39 and he believes he may have passed out briefly.  He denied any associated tachycardia dysrhythmias.  He is sleeping much better with CPAP.  A new download was obtained from June 16 through November 18, 2020.  Usage was excellent with essentially 100% compliance and average use of 10 hours per night.  His AutoSet CPAP air sense 10 unit is set at a pressure of 10 to 15 cm of water.  AHI is 6.2.  95th percentile pressure is 12.7 with maximum average pressure 13.9.  He feels well.  He denies swelling and has been taking HCTZ 12.5 mg twice a day in addition to  olmesartan 20 mg, amlodipine 10 mg and metoprolol succinate 12.5 mg daily.  He  presents for evaluation.  Past Medical History:  Diagnosis Date   Allergy    enviornmental   Anxiety    Anxiety and depression 10/25/2012   Arthritis    self dx   Back pain    L4-L5 bulging disc, L5-S1 - bulging disc, pinched nerve in neck   CAD (coronary artery disease)      Dr Claiborne Billings, s/p stents----- sees cards yearly    Cancer [facility])    basal cell; carcinoma removed x3 and SCC   Cataract    bilateral sx   Depression    Diabetes mellitus    pt denies   GERD (gastroesophageal reflux disease)    on meds   History of shingles 2009   Hyperlipidemia    on meds   Hypertension    on meds   Myocardial infarction ([place]) 1997   caused by a blood clot   Sleep apnea    not have a CPAP as of 02/27/2020-just dx    Past Surgical History:  Procedure Laterality Date   ANGIOPLASTY  1997   basel cell     skin carcinoma removed x 4   CARDIAC CATHETERIZATION     10/04/1999   COLONOSCOPY  2018   SA-MAC-suprep(good)-TA   DOPPLER ECHOCARDIOGRAPHY  07/09/2006   EF 50 to 55 %, LA mildy dilated   HERNIA REPAIR  1951   RIGHT   KNEE ARTHROSCOPY Right 2001   R   NM MYOCAR PERF WALL MOTION  08/22/2011   Mets 13,low risk study   POLYPECTOMY  2018   TA   WRIST SURGERY  1984   right    No Known Allergies  Current Outpatient Medications  Medication Sig Dispense Refill   aspirin EC 81 MG tablet Take 1 tablet (81 mg total) by mouth daily. 90 tablet 3   atorvastatin (LIPITOR) 40 MG tablet TAKE 1 TABLET EVERY DAY AT 6 PM 90 tablet 3   cetirizine (ZYRTEC) 10 MG tablet Take 10 mg by mouth daily.     citalopram (CELEXA) 20 MG tablet TAKE 1 AND 1/2 TABLETS EVERY DAY (NEED MD APPOINTMENT FOR REFILLS) 45 tablet 1   ezetimibe (ZETIA) 10 MG tablet Take 1 tablet (10 mg total) by mouth daily. 90 tablet 3   FLUTICASONE PROPIONATE, NASAL, NA in the morning.     metFORMIN (GLUCOPHAGE XR) 500 MG 24 hr tablet Take 1 tablet (500 mg  total) by mouth daily with breakfast. 90 tablet 1   metoprolol succinate (TOPROL-XL) 25 MG 24 hr tablet Take 0.5 tablets (12.5 mg total) by mouth daily. Take with or immediately following a meal. 90 tablet 1   metoprolol tartrate (LOPRESSOR) 25 MG tablet TAKE 1/2 TO 1 TABLET EVERY DAY  AS NEEDED (HEART PALPITATIONS). 90 tablet 3   Multiple Vitamin (MULTIVITAMIN) tablet Take 1 tablet by mouth daily.     olmesartan (BENICAR) 20 MG tablet Take 1 tablet (20 mg total) by mouth daily. 90 tablet 0   oxymetazoline (AFRIN) 0.05 % nasal spray Place 1 spray into both nostrils daily.     tadalafil (CIALIS) 20 MG tablet May take one or two up to every other day as needed for ed. 10 tablet 3   vitamin C (ASCORBIC ACID) 500 MG tablet Take 500 mg by mouth daily.     vitamin E 400 UNIT capsule Take 400 Units by mouth daily.     amLODipine (NORVASC) 5 MG tablet Take 1.5 tablets (7.5 mg total) by mouth daily. 135 tablet 3  diphenhydrAMINE (BENADRYL) 25 mg capsule  (Patient not taking: Reported on 11/20/2020)     hydrochlorothiazide (MICROZIDE) 12.5 MG capsule Alternate daily between taking 51m (2 capsules) daily and 12.527m(1 capsule) daily. 90 capsule 2   nitroGLYCERIN (NITROSTAT) 0.4 MG SL tablet Place 1 tablet (0.4 mg total) under the tongue every 5 (five) minutes as needed for chest pain. 25 tablet 1   No current facility-administered medications for this visit.    Social History   Socioeconomic History   Marital status: Married    Spouse name: Not on file   Number of children: 2   Years of education: Not on file   Highest education level: Not on file  Occupational History   Occupation: executive recruter   Tobacco Use   Smoking status: Former    Types: Cigars    Quit date: 09/2020    Years since quitting: 0.2   Smokeless tobacco: Never  Vaping Use   Vaping Use: Never used  Substance and Sexual Activity   Alcohol use: Yes    Alcohol/week: 21.0 standard drinks    Types: 21 Glasses of wine per  week    Comment:  3 glasses wine / night   Drug use: No   Sexual activity: Not on file  Other Topics Concern   Not on file  Social History Narrative   Lives w/ wife            Social Determinants of Health   Financial Resource Strain: Not on file  Food Insecurity: Not on file  Transportation Needs: Not on file  Physical Activity: Not on file  Stress: Not on file  Social Connections: Not on file  Intimate Partner Violence: Not on file   Social history is notable in that he is married and has 2 children. He now has a dog and walks the dog 1 mile 2 times a day. He is no longer using his treadmill. There is no tobacco use. He does drink alcohol.  Family History  Problem Relation Age of Onset   Stroke Mother        TIA   COPD Mother    Hypertension Sister    Prostate cancer Father [id number] Coronary artery disease Father        GM   Diabetes Brother    Prostate cancer Brother [id number] Diabetes Maternal Grandmother    Colon cancer Neg Hx    Esophageal cancer Neg Hx    Colon polyps Neg Hx    Rectal cancer Neg Hx    Stomach cancer Neg Hx     ROS General: Negative; No fevers, chills, or night sweats;  Positive for previous purposeful weight loss of 25 pounds. HEENT: Negative; No changes in vision or hearing, sinus congestion, difficulty swallowing Pulmonary: Negative; No cough, wheezing, shortness of breath, hemoptysis Cardiovascular: See HPI GI: Negative; No nausea, vomiting, diarrhea, or abdominal pain GU: Mild erectile dysfunction; No dysuria, hematuria, or difficulty voiding Musculoskeletal: Negative; no myalgias, joint pain, or weakness Hematologic/Oncology: Negative; no easy bruising, bleeding Endocrine: Negative; no heat/cold intolerance; no diabetes Neuro: Mild short-term memory loss; no changes in balance, headaches Skin: Negative; No rashes or skin lesions Psychiatric: Mild depression/anxiety Sleep: Positive struct of sleep apnea with significant improvement in his  prior fatigability and snoring; no bruxism, restless legs, hypnogognic hallucinations, no cataplexy  Other comprehensive 14 point system review is negative.   PE BP (!) 128/54 (BP Location: Left Arm, Patient Position: Sitting, Cuff Size: Normal)  Pulse (!) 48   Ht _0  (1.778 m)   Wt 201 lb 9.6 oz (91.4 kg)   SpO2 95%   BMI 28.93 kg/m    Repeat blood pressure by me was 132/66 supine and 126/58 standing  Wt Readings from Last 3 Encounters:  11/20/20 201 lb 9.6 oz (91.4 kg)  08/27/20 202 lb (91.6 kg)  08/03/20 200 lb 3.2 oz (90.8 kg)   General: Alert, oriented, no distress.  Skin: normal turgor, no rashes, warm and dry HEENT: Normocephalic, atraumatic. Pupils equal round and reactive to light; sclera anicteric; extraocular muscles intact;  Nose without nasal septal hypertrophy Mouth/Parynx benign; Mallinpatti scale 3 Neck: No JVD, no carotid bruits; normal carotid upstroke Lungs: clear to ausculatation and percussion; no wheezing or rales Chest wall: without tenderness to palpitation Heart: PMI not displaced, RRR, s1 s2 normal, 1/6 systolic murmur, no diastolic murmur, no rubs, gallops, thrills, or heaves Abdomen: soft, nontender; no hepatosplenomehaly, BS+; abdominal aorta nontender and not dilated by palpation. Back: no CVA tenderness Pulses 2+ Musculoskeletal: full range of motion, normal strength, no joint deformities Extremities: no clubbing cyanosis or edema, Homan's sign negative  Neurologic: grossly nonfocal; Cranial nerves grossly wnl Psychologic: Normal mood and affect    November 20, 2020 ECG (independently read by me): Sinus Bradycardia at 48; no ectopy; normal intervals  May 08, 2020 ECG (independently read by me): Sinus bradycardia 52 bpm.  No ectopy.  Normal intervals  August 30,2021 ECG (independently read by me):Sinus Bradycardia at 54; no ectopy, normal intervals  May 2021 ECG (independently read by me): Sinus bradycardia 56 bpm.  No ectopy.  QTc  interval 467 ms  August 2020 ECG (independently read by me): Normal sinus rhythm at 61 bpm.  Sep 06, 2018 ECG (independently read by me): Marked sinus bradycardia at 43 bpm.  No ST segment changes.  PR interval 148 ms, QTc interval 419 ms.  March 2019 ECG (independently read by me): sinus bradycardia 51 bpm.  No ectopy.  Normal intervals.  March 2018 ECG (independently read by me): Sinus bradycardia 52 bpm.  Small nondiagnostic inferior Q waves.  Normal intervals  November 2016 ECG (independently read by me):  Sinus bradycardia at 49 bpm.  Small inferior Q waves with preserved R waves.  February 2016 ECG (independently read by me): Sinus bradycardia 46 bpm.  No ectopy.  February 2015 ECG (independently read by me): Normal sinus rhythm at 62 beats per minute. Small nondiagnostic inferior Q waves. Observed R waves.  LABS:  BMP Latest Ref Rng & Units 08/03/2020 05/08/2020 04/25/2020  Glucose 70 - 99 mg/dL 115(H) 107(H) 110(H)  BUN 6 - 23 mg/dL _1 Creatinine 0.40 - 1.50 mg/dL 0.92 0.84 0.84  BUN/Creat Ratio 10 - 24 - 12 14  Sodium 135 - 145 mEq/L 138 142 138  Potassium 3.5 - 5.1 mEq/L 4.7 5.0 5.4(H)  Chloride 96 - 112 mEq/L 102 102 99  CO2 19 - 32 mEq/L _2 Calcium 8.4 - 10.5 mg/dL 9.1 9.1 9.4      Component Value Date/Time   PROT 6.9 08/03/2020 1031   PROT 6.8 04/25/2020 0854   ALBUMIN 4.0 08/03/2020 1031   ALBUMIN 4.3 04/25/2020 0854   AST 22 08/03/2020 1031   ALT 28 08/03/2020 1031   ALKPHOS 89 08/03/2020 1031   BILITOT 0.5 08/03/2020 1031   BILITOT 0.7 04/25/2020 0854   BILIDIR 0.1 08/03/2020 1031    CBC Latest Ref Rng & Units 08/03/2020 04/25/2020  07/22/2019  WBC 4.0 - 10.5 K/uL 6.0 7.2 7.4  Hemoglobin 13.0 - 17.0 g/dL 14.3 14.4 14.2  Hematocrit 39.0 - 52.0 % 41.9 43.6 41.9  Platelets 150.0 - 400.0 K/uL 197.0 247 246.0   Lab Results  Component Value Date   MCV 93.0 08/03/2020   MCV 95 04/25/2020   MCV 94.1 07/22/2019   Lab Results  Component Value Date    TSH 1.44 08/03/2020   Lab Results  Component Value Date   HGBA1C 6.5 08/03/2020    BNP No results found for: PROBNP  Lipid Panel     Component Value Date/Time   CHOL 152 08/03/2020 1031   CHOL 157 04/25/2020 0854   TRIG 165.0 (H) 08/03/2020 1031   HDL 60.50 08/03/2020 1031   HDL 77 04/25/2020 0854   CHOLHDL 3 08/03/2020 1031   VLDL 33.0 08/03/2020 1031   LDLCALC 58 08/03/2020 1031   LDLCALC 54 04/25/2020 0854    RADIOLOGY: No results found.  IMPRESSION:  1. CAD in native artery   2. OSA (obstructive sleep apnea)   3. Essential hypertension   4. Grade I diastolic dysfunction   5. Hyperlipidemia with target LDL less than 70     ASSESSMENT AND PLAN:  Mr. Siwek is a 70 year old Caucasian male who is 25 years status post his inferior wall myocardial infarction at which time he underwent acute intervention to a totally occluded RCA and staged intervention to circumflex marginal vessel in January 1997. His last catheterization in 2001 showed plaque regression on his aggressive medical regimen.  He has been aggressively treated and has remained stable without recurrent anginal symptoms.  A Holter monitor obtained in September 2020 revealed predominant sinus rhythm with occasional isolated PVCs although there was one 4 beat episode of multifocal ventricular tachycardia.  His beta-blocker dose had been reduced because of bradycardia with heart rates in the 40s.  An echo Doppler studies demonstrated normal systolic function with EF of 60 to 65%.  Due to significant complaints of fatigability and being tired all the time, he ultimately had a sleep study which confirmed severe sleep apnea with an AHI of 39.2/h, RDI 57.4.  AHI while supine was very severe at 58.1 and REM sleep AHI was 30.0.  O2 nadir was 85%.  He has been on CPAP therapy since April 12, 2020.  At his last visit, adjustments were made to his CPAP unit.  Presently he believes he is sleeping well.  On his most recent  download he is now sleeping 10 hours per night.  AHI is 6.2/h.  His 95th percentile pressure is 12.7 with maximum average pressure 13.9.  I have recommended discontinuance of his 5-minute ramp time.  I am also increasing his auto settings to start at 11 and go to 16 cm of water. He underwent a follow-up echo Doppler study following a COVID-19 infection this continue to show normal LV systolic function EF of 55 to 60% and grade 1 diastolic dysfunction.  His blood pressure today is relatively stable.  His recent episode of presyncope/syncope was reviewed and I suspect this may have been in the setting of significant dehydration during extreme heat working outside for some time in the yard and digging up plants.  He has been taking HCTZ 12.5 mg twice a day.  I have suggested he reduce his HCTZ and will change him to 25 mg alternating with 12.5 mg every other day with a dose to be given in the morning.  I also  am reducing his amlodipine down to 7.5 mg which should improve his ramp time.  Repeat laboratory in April showed improvement in his previous elevated potassium from 5.4 down to 4.7.  Lipid studies remain stable with an LDL of 58, although triglycerides were minimally increased at 165.  I will see him in 6 months for reevaluation.   Troy Sine, MD, [facility]  11/22/2020 1:21 PM

## 2020-11-20 NOTE — Patient Instructions (Signed)
Medication Instructions:  Alternate daily between 25mg  Hydrochlorothiazide (2 capsules) a day and 12.5mg  Hydrochlorothiazide (1 capsule) a day.   DECREASE amlodipine to 7.5mg  (1.5 tablets) daily.   *If you need a refill on your cardiac medications before your next appointment, please call your pharmacy*   Lab Work: None ordered.    Testing/Procedures: None ordered.    Follow-Up: At [facility], you and your health needs are our priority.  As part of our continuing mission to provide you with exceptional heart care, we have created designated Provider Care Teams.  These Care Teams include your primary Cardiologist (physician) and Advanced Practice Providers (APPs -  Physician Assistants and Nurse Practitioners) who all work together to provide you with the care you need, when you need it.  We recommend signing up for the patient portal called "MyChart".  Sign up information is provided on this After Visit Summary.  MyChart is used to connect with patients for Virtual Visits (Telemedicine).  Patients are able to view lab/test results, encounter notes, upcoming appointments, etc.  Non-urgent messages can be sent to your provider as well.   To learn more about what you can do with MyChart, go to [email or web address].    Your next appointment:   6 month(s)  The format for your next appointment:   In Person  Provider:   Shelva Majestic, MD

## 2020-11-22 ENCOUNTER — Encounter: Payer: Self-pay | Admitting: Cardiovascular Disease

## 2020-11-26 ENCOUNTER — Other Ambulatory Visit: Payer: Self-pay | Admitting: Family Medicine

## 2020-11-26 ENCOUNTER — Other Ambulatory Visit: Payer: Self-pay

## 2020-11-26 DIAGNOSIS — I10 Essential (primary) hypertension: Secondary | ICD-10-CM

## 2020-11-26 NOTE — Telephone Encounter (Signed)
Chart supports Rx  Last OV 08/27/20 Next OV 11/27/20

## 2020-11-27 ENCOUNTER — Ambulatory Visit (INDEPENDENT_AMBULATORY_CARE_PROVIDER_SITE_OTHER): Payer: Medicare Other | Admitting: Family Medicine

## 2020-11-27 ENCOUNTER — Encounter: Payer: Self-pay | Admitting: Family Medicine

## 2020-11-27 VITALS — BP 116/52 | HR 48 | Temp 97.4°F | Wt 202.4 lb

## 2020-11-27 DIAGNOSIS — R058 Other specified cough: Secondary | ICD-10-CM | POA: Diagnosis not present

## 2020-11-27 DIAGNOSIS — F528 Other sexual dysfunction not due to a substance or known physiological condition: Secondary | ICD-10-CM | POA: Diagnosis not present

## 2020-11-27 DIAGNOSIS — I10 Essential (primary) hypertension: Secondary | ICD-10-CM | POA: Diagnosis not present

## 2020-11-27 DIAGNOSIS — E1142 Type 2 diabetes mellitus with diabetic polyneuropathy: Secondary | ICD-10-CM

## 2020-11-27 DIAGNOSIS — T464X5A Adverse effect of angiotensin-converting-enzyme inhibitors, initial encounter: Secondary | ICD-10-CM

## 2020-11-27 LAB — URINALYSIS, ROUTINE W REFLEX MICROSCOPIC
Bilirubin Urine: NEGATIVE
Hgb urine dipstick: NEGATIVE
Ketones, ur: NEGATIVE
Leukocytes,Ua: NEGATIVE
Nitrite: NEGATIVE
RBC / HPF: NONE SEEN (ref 0–?)
Specific Gravity, Urine: 1.015 (ref 1.000–1.030)
Total Protein, Urine: NEGATIVE
Urine Glucose: NEGATIVE
Urobilinogen, UA: 0.2 (ref 0.0–1.0)
WBC, UA: NONE SEEN (ref 0–?)
pH: 7 (ref 5.0–8.0)

## 2020-11-27 LAB — BASIC METABOLIC PANEL
BUN: 16 mg/dL (ref 6–23)
CO2: 30 mEq/L (ref 19–32)
Calcium: 9.3 mg/dL (ref 8.4–10.5)
Chloride: 101 mEq/L (ref 96–112)
Creatinine, Ser: 0.91 mg/dL (ref 0.40–1.50)
GFR: 84.48 mL/min (ref >60.00)
Glucose, Bld: 102 mg/dL — ABNORMAL HIGH (ref 70–99)
Potassium: 4.6 mEq/L (ref 3.5–5.1)
Sodium: 138 mEq/L (ref 135–145)

## 2020-11-27 MED ORDER — AMLODIPINE BESYLATE 5 MG PO TABS: 5.0000 mg | ORAL_TABLET | Freq: Every day | ORAL | 2 refills | Status: DC

## 2020-11-27 NOTE — Progress Notes (Signed)
Established Patient Office Visit  Subjective:  Patient ID: Dakota Gibbs, male    DOB: 09/07/1950  Age: 70 y.o. MRN: [id number]  CC:  Chief Complaint  Patient presents with   Follow-up    3 mo f/u DM at home bs ranges 111-120. Pt c/o left flank pain 2-3 months, pain is dull and pt states  area is tender to touch.      HPI Dakota Gibbs presents for follow-up of blood pressure, cough, diabetes and discomfort in his left lower lateral rib cage.  Discomfort is present sometimes in the morning.  It diminishes after he rises and moves around.  He is moving his bowels regularly.  There is no difficulty or changes in his urination.  Changing olmesartan from lisinopril has helped the cough.  He is having no issues taking Glucophage.  Cialis helps him achieve an erection but not to maintain it.  He had a syncopal spell while working in his yard on a hot afternoon.  Blood pressure at home is running in the 110 range over 60s.  He does carry a diagnosis of diastolic dysfunction.  He has quit smoking and has decreased his alcohol intake by 50%.  Seeing orthopedics for sciatica that has been improved greatly with physical therapy and intrathecal injections.  Past Medical History:  Diagnosis Date   Allergy    enviornmental   Anxiety    Anxiety and depression 10/25/2012   Arthritis    self dx   Back pain    L4-L5 bulging disc, L5-S1 - bulging disc, pinched nerve in neck   CAD (coronary artery disease)      Dr Claiborne Billings, s/p stents----- sees cards yearly    Cancer [facility])    basal cell; carcinoma removed x3 and SCC   Cataract    bilateral sx   Depression    Diabetes mellitus    pt denies   GERD (gastroesophageal reflux disease)    on meds   History of shingles 2009   Hyperlipidemia    on meds   Hypertension    on meds   Myocardial infarction ([place]) 1997   caused by a blood clot   Sleep apnea    not have a CPAP as of 02/27/2020-just dx    Past Surgical History:  Procedure  Laterality Date   ANGIOPLASTY  1997   basel cell     skin carcinoma removed x 4   CARDIAC CATHETERIZATION     10/04/1999   COLONOSCOPY  2018   SA-MAC-suprep(good)-TA   DOPPLER ECHOCARDIOGRAPHY  07/09/2006   EF 50 to 55 %, LA mildy dilated   HERNIA REPAIR  1951   RIGHT   KNEE ARTHROSCOPY Right 2001   R   NM MYOCAR PERF WALL MOTION  08/22/2011   Mets 13,low risk study   POLYPECTOMY  2018   TA   WRIST SURGERY  1984   right    Family History  Problem Relation Age of Onset   Stroke Mother        TIA   COPD Mother    Hypertension Sister    Prostate cancer Father 74   Coronary artery disease Father        GM   Diabetes Brother    Prostate cancer Brother 107   Diabetes Maternal Grandmother    Colon cancer Neg Hx    Esophageal cancer Neg Hx    Colon polyps Neg Hx    Rectal cancer Neg Hx  Stomach cancer Neg Hx     Social History   Socioeconomic History   Marital status: Married    Spouse name: Not on file   Number of children: 2   Years of education: Not on file   Highest education level: Not on file  Occupational History   Occupation: executive recruter   Tobacco Use   Smoking status: Former    Types: Cigars    Quit date: 09/2020    Years since quitting: 0.2   Smokeless tobacco: Never  Vaping Use   Vaping Use: Never used  Substance and Sexual Activity   Alcohol use: Yes    Alcohol/week: 21.0 standard drinks    Types: 21 Glasses of wine per week    Comment:  3 glasses wine / night   Drug use: No   Sexual activity: Not on file  Other Topics Concern   Not on file  Social History Narrative   Lives w/ wife            Social Determinants of Health   Financial Resource Strain: Not on file  Food Insecurity: Not on file  Transportation Needs: Not on file  Physical Activity: Not on file  Stress: Not on file  Social Connections: Not on file  Intimate Partner Violence: Not on file    Outpatient Medications Prior to Visit  Medication Sig Dispense Refill    aspirin EC 81 MG tablet Take 1 tablet (81 mg total) by mouth daily. 90 tablet 3   atorvastatin (LIPITOR) 40 MG tablet TAKE 1 TABLET EVERY DAY AT 6 PM 90 tablet 3   cetirizine (ZYRTEC) 10 MG tablet Take 10 mg by mouth daily.     citalopram (CELEXA) 20 MG tablet TAKE 1 AND 1/2 TABLETS EVERY DAY (NEED MD APPOINTMENT FOR REFILLS) 45 tablet 1   ezetimibe (ZETIA) 10 MG tablet Take 1 tablet (10 mg total) by mouth daily. 90 tablet 3   FLUTICASONE PROPIONATE, NASAL, NA in the morning.     hydrochlorothiazide (MICROZIDE) 12.5 MG capsule Alternate daily between taking 74m (2 capsules) daily and 12.553m(1 capsule) daily. 90 capsule 2   metFORMIN (GLUCOPHAGE XR) 500 MG 24 hr tablet Take 1 tablet (500 mg total) by mouth daily with breakfast. 90 tablet 1   metoprolol succinate (TOPROL-XL) 25 MG 24 hr tablet Take 0.5 tablets (12.5 mg total) by mouth daily. Take with or immediately following a meal. 90 tablet 1   metoprolol tartrate (LOPRESSOR) 25 MG tablet TAKE 1/2 TO 1 TABLET EVERY DAY  AS NEEDED (HEART PALPITATIONS). 90 tablet 3   Multiple Vitamin (MULTIVITAMIN) tablet Take 1 tablet by mouth daily.     nitroGLYCERIN (NITROSTAT) 0.4 MG SL tablet Place 1 tablet (0.4 mg total) under the tongue every 5 (five) minutes as needed for chest pain. 25 tablet 1   olmesartan (BENICAR) 20 MG tablet TAKE 1 TABLET EVERY DAY 90 tablet 0   oxymetazoline (AFRIN) 0.05 % nasal spray Place 1 spray into both nostrils daily.     tadalafil (CIALIS) 20 MG tablet May take one or two up to every other day as needed for ed. 10 tablet 3   vitamin C (ASCORBIC ACID) 500 MG tablet Take 500 mg by mouth daily.     vitamin E 400 UNIT capsule Take 400 Units by mouth daily.     amLODipine (NORVASC) 5 MG tablet Take 1.5 tablets (7.5 mg total) by mouth daily. 135 tablet 3   diphenhydrAMINE (BENADRYL) 25 mg capsule  (Patient  not taking: Reported on 11/27/2020)     No facility-administered medications prior to visit.    No Known  Allergies  ROS Review of Systems  Constitutional: Negative.   HENT: Negative.    Eyes:  Negative for photophobia and visual disturbance.  Respiratory:  Negative for cough, chest tightness and shortness of breath.   Cardiovascular: Negative.   Gastrointestinal:  Negative for abdominal pain and constipation.  Endocrine: Negative for polyphagia and polyuria.  Genitourinary:  Negative for difficulty urinating, frequency and urgency.  Musculoskeletal:  Positive for back pain.  Neurological:  Negative for speech difficulty and weakness.  Hematological:  Does not bruise/bleed easily.  Psychiatric/Behavioral: Negative.       Objective:    Physical Exam Vitals and nursing note reviewed.  Constitutional:      General: He is not in acute distress.    Appearance: Normal appearance. He is not ill-appearing, toxic-appearing or diaphoretic.  HENT:     Head: Normocephalic and atraumatic.     Right Ear: External ear normal.     Left Ear: External ear normal.  Eyes:     Conjunctiva/sclera: Conjunctivae normal.  Cardiovascular:     Rate and Rhythm: Normal rate and regular rhythm.  Pulmonary:     Effort: Pulmonary effort is normal. No respiratory distress.     Breath sounds: Normal breath sounds. No stridor. No wheezing, rhonchi or rales.  Chest:     Chest wall: No tenderness.  Abdominal:     General: Bowel sounds are normal. There is no distension.     Palpations: There is no mass.     Tenderness: There is no abdominal tenderness. There is no right CVA tenderness, left CVA tenderness, guarding or rebound.     Hernia: A hernia is present. Hernia is present in the ventral area.  Musculoskeletal:     Cervical back: No rigidity or tenderness.  Lymphadenopathy:     Cervical: No cervical adenopathy.  Skin:    General: Skin is warm and dry.  Neurological:     Mental Status: He is alert and oriented to person, place, and time.  Psychiatric:        Mood and Affect: Mood normal.         Behavior: Behavior normal.    BP (!) 116/52   Pulse (!) 48   Temp (!) 97.4 F (36.3 C) (Temporal)   Wt 202 lb 6.4 oz (91.8 kg)   SpO2 97%   BMI 29.04 kg/m  Wt Readings from Last 3 Encounters:  11/27/20 202 lb 6.4 oz (91.8 kg)  11/20/20 201 lb 9.6 oz (91.4 kg)  08/27/20 202 lb (91.6 kg)     Health Maintenance Due  Topic Date Due   Hepatitis C Screening  Never done   Zoster Vaccines- Shingrix (1 of 2) Never done   FOOT EXAM  02/04/2017   OPHTHALMOLOGY EXAM  10/20/2019    There are no preventive care reminders to display for this patient.  Lab Results  Component Value Date   TSH 1.44 08/03/2020   Lab Results  Component Value Date   WBC 6.0 08/03/2020   HGB 14.3 08/03/2020   HCT 41.9 08/03/2020   MCV 93.0 08/03/2020   PLT 197.0 08/03/2020   Lab Results  Component Value Date   NA 138 08/03/2020   K 4.7 08/03/2020   CO2 28 08/03/2020   GLUCOSE 115 (H) 08/03/2020   BUN 17 08/03/2020   CREATININE 0.92 08/03/2020   BILITOT 0.5 08/03/2020  ALKPHOS 89 08/03/2020   AST 22 08/03/2020   ALT 28 08/03/2020   PROT 6.9 08/03/2020   ALBUMIN 4.0 08/03/2020   CALCIUM 9.1 08/03/2020   GFR 83.56 08/03/2020   Lab Results  Component Value Date   CHOL 152 08/03/2020   Lab Results  Component Value Date   HDL 60.50 08/03/2020   Lab Results  Component Value Date   LDLCALC 58 08/03/2020   Lab Results  Component Value Date   TRIG 165.0 (H) 08/03/2020   Lab Results  Component Value Date   CHOLHDL 3 08/03/2020   Lab Results  Component Value Date   HGBA1C 6.5 08/03/2020      Assessment & Plan:   Problem List Items Addressed This Visit       Cardiovascular and Mediastinum   Essential hypertension   Relevant Medications   amLODipine (NORVASC) 5 MG tablet   Other Relevant Orders   Basic metabolic panel   Urinalysis, Routine w reflex microscopic     Endocrine   Type 2 diabetes mellitus with diabetic polyneuropathy ([place]) - Primary   Relevant Orders   Basic  metabolic panel     Other   ERECTILE DYSFUNCTION   Relevant Orders   Ambulatory referral to Urology   Cough due to ACE inhibitor    Meds ordered this encounter  Medications   amLODipine (NORVASC) 5 MG tablet    Sig: Take 1 tablet (5 mg total) by mouth daily.    Dispense:  90 tablet    Refill:  2    Follow-up: Return in about 6 months (around 05/30/2021), or We have decreased amlodipine to 20m daily. Check and record bps. Return if over 130/90..Marland Kitchen Have decreased amlodipine to 5 mg daily secondary to hypotension.  Continue metoprolol and HCTZ as previously directed.  Continue Glucophage for diabetes congratulated him on stopping smoking and decreasing his alcohol intake.  Agrees to see urology for follow-up of erectile dysfunction.  WLibby Maw MD

## 2020-12-05 DIAGNOSIS — M545 Low back pain, unspecified: Secondary | ICD-10-CM | POA: Diagnosis not present

## 2020-12-15 DIAGNOSIS — Z20822 Contact with and (suspected) exposure to covid-19: Secondary | ICD-10-CM | POA: Diagnosis not present

## 2020-12-31 ENCOUNTER — Ambulatory Visit: Payer: Medicare Other | Admitting: Cardiovascular Disease

## 2021-01-03 ENCOUNTER — Other Ambulatory Visit: Payer: Self-pay | Admitting: Family Medicine

## 2021-01-03 DIAGNOSIS — F419 Anxiety disorder, unspecified: Secondary | ICD-10-CM

## 2021-01-12 ENCOUNTER — Telehealth: Payer: Self-pay

## 2021-01-12 NOTE — Telephone Encounter (Signed)
Pt has declined at this time states he will call back at a later date to schedule AWV.

## 2021-02-23 ENCOUNTER — Other Ambulatory Visit: Payer: Self-pay | Admitting: Family Medicine

## 2021-02-23 DIAGNOSIS — E1142 Type 2 diabetes mellitus with diabetic polyneuropathy: Secondary | ICD-10-CM

## 2021-03-09 ENCOUNTER — Other Ambulatory Visit: Payer: Self-pay | Admitting: Cardiovascular Disease

## 2021-03-11 DIAGNOSIS — Z23 Encounter for immunization: Secondary | ICD-10-CM | POA: Diagnosis not present

## 2021-04-16 ENCOUNTER — Telehealth: Payer: Self-pay | Admitting: *Deleted

## 2021-04-16 NOTE — Telephone Encounter (Signed)
Patient notified that Dr Claiborne Billings made some pressure adjustments to his CPAP machine. Hopefully this will help with the uncomfortable air pressure he is feeling. Patient states that he "thinks he now has it under control." Patient told to call back if he has any other problems or concerns.

## 2021-05-02 ENCOUNTER — Other Ambulatory Visit: Payer: Self-pay | Admitting: Family Medicine

## 2021-05-02 ENCOUNTER — Other Ambulatory Visit: Payer: Self-pay | Admitting: Cardiovascular Disease

## 2021-05-02 DIAGNOSIS — I10 Essential (primary) hypertension: Secondary | ICD-10-CM

## 2021-05-05 HISTORY — PX: BILATERAL CARPAL TUNNEL RELEASE: SHX6508

## 2021-05-14 ENCOUNTER — Encounter: Payer: Medicare Other | Admitting: Family Medicine

## 2021-05-21 ENCOUNTER — Encounter: Payer: Medicare Other | Admitting: Family Medicine

## 2021-05-30 ENCOUNTER — Encounter: Payer: Self-pay | Admitting: Family Medicine

## 2021-05-30 ENCOUNTER — Ambulatory Visit (INDEPENDENT_AMBULATORY_CARE_PROVIDER_SITE_OTHER): Payer: Medicare Other | Admitting: Family Medicine

## 2021-05-30 ENCOUNTER — Other Ambulatory Visit: Payer: Self-pay

## 2021-05-30 VITALS — BP 106/68 | HR 51 | Temp 97.3°F | Ht 70.0 in | Wt 206.2 lb

## 2021-05-30 DIAGNOSIS — N401 Enlarged prostate with lower urinary tract symptoms: Secondary | ICD-10-CM | POA: Diagnosis not present

## 2021-05-30 DIAGNOSIS — N3943 Post-void dribbling: Secondary | ICD-10-CM | POA: Diagnosis not present

## 2021-05-30 DIAGNOSIS — Z1159 Encounter for screening for other viral diseases: Secondary | ICD-10-CM | POA: Diagnosis not present

## 2021-05-30 DIAGNOSIS — I10 Essential (primary) hypertension: Secondary | ICD-10-CM

## 2021-05-30 DIAGNOSIS — Z23 Encounter for immunization: Secondary | ICD-10-CM

## 2021-05-30 DIAGNOSIS — E119 Type 2 diabetes mellitus without complications: Secondary | ICD-10-CM

## 2021-05-30 DIAGNOSIS — E785 Hyperlipidemia, unspecified: Secondary | ICD-10-CM | POA: Diagnosis not present

## 2021-05-30 DIAGNOSIS — Z789 Other specified health status: Secondary | ICD-10-CM | POA: Diagnosis not present

## 2021-05-30 DIAGNOSIS — Z0000 Encounter for general adult medical examination without abnormal findings: Secondary | ICD-10-CM | POA: Diagnosis not present

## 2021-05-30 DIAGNOSIS — R4701 Aphasia: Secondary | ICD-10-CM

## 2021-05-30 DIAGNOSIS — R413 Other amnesia: Secondary | ICD-10-CM

## 2021-05-30 LAB — COMPREHENSIVE METABOLIC PANEL
ALT: 21 U/L (ref 0–53)
AST: 20 U/L (ref 0–37)
Albumin: 4.2 g/dL (ref 3.5–5.2)
Alkaline Phosphatase: 81 U/L (ref 39–117)
BUN: 17 mg/dL (ref 6–23)
CO2: 28 mEq/L (ref 19–32)
Calcium: 9.4 mg/dL (ref 8.4–10.5)
Chloride: 102 mEq/L (ref 96–112)
Creatinine, Ser: 1.07 mg/dL (ref 0.40–1.50)
GFR: 69.31 mL/min (ref >60.00)
Glucose, Bld: 114 mg/dL — ABNORMAL HIGH (ref 70–99)
Potassium: 5 mEq/L (ref 3.5–5.1)
Sodium: 138 mEq/L (ref 135–145)
Total Bilirubin: 0.8 mg/dL (ref 0.2–1.2)
Total Protein: 7 g/dL (ref 6.0–8.3)

## 2021-05-30 LAB — URINALYSIS, ROUTINE W REFLEX MICROSCOPIC
Bilirubin Urine: NEGATIVE
Hgb urine dipstick: NEGATIVE
Leukocytes,Ua: NEGATIVE
Nitrite: NEGATIVE
Specific Gravity, Urine: 1.025 (ref 1.000–1.030)
Total Protein, Urine: NEGATIVE
Urine Glucose: NEGATIVE
Urobilinogen, UA: 4 — AB (ref 0.0–1.0)
pH: 6 (ref 5.0–8.0)

## 2021-05-30 LAB — MICROALBUMIN / CREATININE URINE RATIO
Creatinine,U: 251.2 mg/dL
Microalb Creat Ratio: 0.6 mg/g (ref 0.0–30.0)
Microalb, Ur: 1.6 mg/dL (ref 0.0–1.9)

## 2021-05-30 LAB — LIPID PANEL
Cholesterol: 137 mg/dL (ref 0–200)
HDL: 60.3 mg/dL (ref >39.00)
LDL Cholesterol: 47 mg/dL (ref 0–99)
NonHDL: 76.99
Total CHOL/HDL Ratio: 2
Triglycerides: 152 mg/dL — ABNORMAL HIGH (ref 0.0–149.0)
VLDL: 30.4 mg/dL (ref 0.0–40.0)

## 2021-05-30 LAB — CBC
HCT: 42.8 % (ref 39.0–52.0)
Hemoglobin: 14.3 g/dL (ref 13.0–17.0)
MCHC: 33.4 g/dL (ref 30.0–36.0)
MCV: 95.3 fl (ref 78.0–100.0)
Platelets: 242 10*3/uL (ref 150.0–400.0)
RBC: 4.49 Mil/uL (ref 4.22–5.81)
RDW: 13.6 % (ref 11.5–15.5)
WBC: 6.6 10*3/uL (ref 4.0–10.5)

## 2021-05-30 LAB — HEMOGLOBIN A1C: Hgb A1c MFr Bld: 6.2 % (ref 4.6–6.5)

## 2021-05-30 LAB — PSA: PSA: 1.05 ng/mL (ref 0.10–4.00)

## 2021-05-30 NOTE — Progress Notes (Signed)
Established Patient Office Visit  Subjective:  Patient ID: Dakota Gibbs, male    DOB: November 20, 1950  Age: 71 y.o. MRN: [id number]  CC:  Chief Complaint  Patient presents with   Annual Exam    CPE, no concerns. Patient fasting.     HPI Dakota Gibbs presents for complete physical exam follow-up of hypertension, diabetes, elevated cholesterol and BPH.  Urine flow is about the same with some post void dribble.  Does not feel as though he needs treatment at this time.  Blood pressure at home runs in the 120s over 80s.  Continues follow-up with cardiology with history of coronary artery disease and status post MI.  Continues atorvastatin and Zetia for elevated cholesterol.  Exercise has been limited due to lower back pain with radiation into the lower extremities.  And follow-up with orthopedics.  Needs surgery.  They are waiting on him to the side.  Expresses concern about possible memory loss.  Forgets neighbors names.  Forgets where he puts things.  Does not get lost driving.  His wife thinks he is distracted but is otherwise unconcerned.  Hearing has been checked recently.  He is able to hear other speaking to him but is sometimes unable to interpret what is being said.  Continues to drink 3 glasses of wine daily.  He says they are 6 ounce servings.  Past Medical History:  Diagnosis Date   Allergy    enviornmental   Anxiety    Anxiety and depression 10/25/2012   Arthritis    self dx   Back pain    L4-L5 bulging disc, L5-S1 - bulging disc, pinched nerve in neck   CAD (coronary artery disease)      Dr Claiborne Billings, s/p stents----- sees cards yearly    Cancer [facility])    basal cell; carcinoma removed x3 and SCC   Cataract    bilateral sx   Depression    Diabetes mellitus    pt denies   GERD (gastroesophageal reflux disease)    on meds   History of shingles 2009   Hyperlipidemia    on meds   Hypertension    on meds   Myocardial infarction ([place]) 1997   caused by a blood clot    Sleep apnea    not have a CPAP as of 02/27/2020-just dx    Past Surgical History:  Procedure Laterality Date   ANGIOPLASTY  1997   basel cell     skin carcinoma removed x 4   CARDIAC CATHETERIZATION     10/04/1999   COLONOSCOPY  2018   SA-MAC-suprep(good)-TA   DOPPLER ECHOCARDIOGRAPHY  07/09/2006   EF 50 to 55 %, LA mildy dilated   HERNIA REPAIR  1951   RIGHT   KNEE ARTHROSCOPY Right 2001   R   NM MYOCAR PERF WALL MOTION  08/22/2011   Mets 13,low risk study   POLYPECTOMY  2018   TA   WRIST SURGERY  1984   right    Family History  Problem Relation Age of Onset   Stroke Mother        TIA   COPD Mother    Hypertension Sister    Prostate cancer Father 32   Coronary artery disease Father        GM   Diabetes Brother    Prostate cancer Brother 7   Diabetes Maternal Grandmother    Colon cancer Neg Hx    Esophageal cancer Neg Hx    Colon polyps  Neg Hx    Rectal cancer Neg Hx    Stomach cancer Neg Hx     Social History   Socioeconomic History   Marital status: Married    Spouse name: Not on file   Number of children: 2   Years of education: Not on file   Highest education level: Not on file  Occupational History   Occupation: executive recruter   Tobacco Use   Smoking status: Former    Types: Cigars    Quit date: 09/2020    Years since quitting: 0.7   Smokeless tobacco: Never  Vaping Use   Vaping Use: Never used  Substance and Sexual Activity   Alcohol use: Yes    Alcohol/week: 21.0 standard drinks    Types: 21 Glasses of wine per week    Comment:  3 glasses wine / night   Drug use: No   Sexual activity: Yes  Other Topics Concern   Not on file  Social History Narrative   Lives w/ wife            Social Determinants of Health   Financial Resource Strain: Not on file  Food Insecurity: Not on file  Transportation Needs: Not on file  Physical Activity: Not on file  Stress: Not on file  Social Connections: Not on file  Intimate Partner Violence:  Not on file    Outpatient Medications Prior to Visit  Medication Sig Dispense Refill   amLODipine (NORVASC) 5 MG tablet Take 1 tablet (5 mg total) by mouth daily. 90 tablet 2   aspirin EC 81 MG tablet Take 1 tablet (81 mg total) by mouth daily. 90 tablet 3   atorvastatin (LIPITOR) 40 MG tablet TAKE 1 TABLET EVERY DAY AT 6 PM 90 tablet 3   cetirizine (ZYRTEC) 10 MG tablet Take 10 mg by mouth daily.     citalopram (CELEXA) 20 MG tablet TAKE 1 AND 1/2 TABLETS EVERY DAY (NEED MD APPOINTMENT FOR REFILLS) 135 tablet 1   ezetimibe (ZETIA) 10 MG tablet TAKE 1 TABLET EVERY DAY 90 tablet 3   FLUTICASONE PROPIONATE, NASAL, NA in the morning.     hydrochlorothiazide (MICROZIDE) 12.5 MG capsule TAKE 2 CAPSULES EVERY OTHER DAY ALTERNATING WITH TAKE 1 CAPSULE EVERY OTHER DAY AS DIRECTED 135 capsule 3   metFORMIN (GLUCOPHAGE-XR) 500 MG 24 hr tablet TAKE 1 TABLET EVERY DAY WITH BREAKFAST 90 tablet 1   metoprolol succinate (TOPROL-XL) 25 MG 24 hr tablet Take 0.5 tablets (12.5 mg total) by mouth daily. Take with or immediately following a meal. 90 tablet 1   metoprolol tartrate (LOPRESSOR) 25 MG tablet TAKE 1/2 TO 1 TABLET EVERY DAY  AS NEEDED (HEART PALPITATIONS). 90 tablet 3   Multiple Vitamin (MULTIVITAMIN) tablet Take 1 tablet by mouth daily.     nitroGLYCERIN (NITROSTAT) 0.4 MG SL tablet Place 1 tablet (0.4 mg total) under the tongue every 5 (five) minutes as needed for chest pain. 25 tablet 1   olmesartan (BENICAR) 20 MG tablet TAKE 1 TABLET EVERY DAY 90 tablet 0   oxymetazoline (AFRIN) 0.05 % nasal spray Place 1 spray into both nostrils daily.     tadalafil (CIALIS) 20 MG tablet May take one or two up to every other day as needed for ed. 10 tablet 3   vitamin C (ASCORBIC ACID) 500 MG tablet Take 500 mg by mouth daily.     vitamin E 400 UNIT capsule Take 400 Units by mouth daily.     No facility-administered medications  prior to visit.    No Known Allergies  ROS Review of Systems  Constitutional:   Negative for diaphoresis, fatigue, fever and unexpected weight change.  HENT: Negative.    Eyes:  Negative for photophobia and visual disturbance.  Respiratory: Negative.    Cardiovascular: Negative.   Gastrointestinal: Negative.   Endocrine: Negative for polyphagia and polyuria.  Genitourinary:  Positive for difficulty urinating. Negative for frequency and urgency.  Musculoskeletal:  Positive for back pain.  Neurological:  Negative for speech difficulty and weakness.  Psychiatric/Behavioral: Negative.       Depression screen [facility] 2/9 05/30/2021 05/30/2021 08/27/2020  Decreased Interest 0 0 0  Down, Depressed, Hopeless 0 0 0  PHQ - 2 Score 0 0 0  Altered sleeping 1 - -  Tired, decreased energy 1 - -  Change in appetite 0 - -  Feeling bad or failure about yourself  0 - -  Trouble concentrating 0 - -  Moving slowly or fidgety/restless 0 - -  Suicidal thoughts 0 - -  PHQ-9 Score 2 - -  Difficult doing work/chores Not difficult at all - -     Objective:    Physical Exam Constitutional:      General: He is not in acute distress.    Appearance: Normal appearance. He is not ill-appearing, toxic-appearing or diaphoretic.  HENT:     Head: Normocephalic and atraumatic.     Right Ear: Tympanic membrane, ear canal and external ear normal.     Left Ear: Tympanic membrane, ear canal and external ear normal.     Mouth/Throat:     Mouth: Mucous membranes are moist.     Pharynx: Oropharynx is clear. No oropharyngeal exudate or posterior oropharyngeal erythema.  Eyes:     General: No scleral icterus.       Right eye: No discharge.        Left eye: No discharge.     Conjunctiva/sclera: Conjunctivae normal.     Pupils: Pupils are equal, round, and reactive to light.  Neck:     Vascular: No carotid bruit.  Cardiovascular:     Rate and Rhythm: Normal rate and regular rhythm.  Pulmonary:     Effort: Pulmonary effort is normal.     Breath sounds: Normal breath sounds.  Abdominal:      General: Abdomen is flat. Bowel sounds are normal. There is no distension.     Palpations: Abdomen is soft. There is no mass.     Tenderness: There is no abdominal tenderness. There is no guarding or rebound.     Hernia: No hernia is present.  Genitourinary:    Prostate: Enlarged. Not tender and no nodules present.     Rectum: Guaiac result negative. No mass, tenderness, anal fissure, external hemorrhoid or internal hemorrhoid. Normal anal tone.  Musculoskeletal:     Cervical back: No rigidity or tenderness.  Lymphadenopathy:     Cervical: No cervical adenopathy.  Skin:    General: Skin is warm and dry.  Neurological:     Mental Status: He is alert and oriented to person, place, and time.  Psychiatric:        Mood and Affect: Mood normal.        Behavior: Behavior normal.    BP 106/68 (BP Location: Right Arm, Patient Position: Sitting, Cuff Size: Normal)    Pulse (!) 51    Temp (!) 97.3 F (36.3 C) (Temporal)    Ht _0  (1.778 m)    Wt 206  lb 3.2 oz (93.5 kg)    SpO2 98%    BMI 29.59 kg/m  Wt Readings from Last 3 Encounters:  05/30/21 206 lb 3.2 oz (93.5 kg)  11/27/20 202 lb 6.4 oz (91.8 kg)  11/20/20 201 lb 9.6 oz (91.4 kg)     Health Maintenance Due  Topic Date Due   Hepatitis C Screening  Never done   Zoster Vaccines- Shingrix (1 of 2) Never done   FOOT EXAM  02/04/2017   OPHTHALMOLOGY EXAM  10/20/2019   HEMOGLOBIN A1C  02/02/2021    There are no preventive care reminders to display for this patient.  Lab Results  Component Value Date   TSH 1.44 08/03/2020   Lab Results  Component Value Date   WBC 6.0 08/03/2020   HGB 14.3 08/03/2020   HCT 41.9 08/03/2020   MCV 93.0 08/03/2020   PLT 197.0 08/03/2020   Lab Results  Component Value Date   NA 138 11/27/2020   K 4.6 11/27/2020   CO2 30 11/27/2020   GLUCOSE 102 (H) 11/27/2020   BUN 16 11/27/2020   CREATININE 0.91 11/27/2020   BILITOT 0.5 08/03/2020   ALKPHOS 89 08/03/2020   AST 22 08/03/2020   ALT 28  08/03/2020   PROT 6.9 08/03/2020   ALBUMIN 4.0 08/03/2020   CALCIUM 9.3 11/27/2020   GFR 84.48 11/27/2020   Lab Results  Component Value Date   CHOL 152 08/03/2020   Lab Results  Component Value Date   HDL 60.50 08/03/2020   Lab Results  Component Value Date   LDLCALC 58 08/03/2020   Lab Results  Component Value Date   TRIG 165.0 (H) 08/03/2020   Lab Results  Component Value Date   CHOLHDL 3 08/03/2020   Lab Results  Component Value Date   HGBA1C 6.5 08/03/2020      Assessment & Plan:   Problem List Items Addressed This Visit       Cardiovascular and Mediastinum   Essential hypertension - Primary   Relevant Orders   CBC   Comprehensive metabolic panel   Urinalysis, Routine w reflex microscopic   Microalbumin / creatinine urine ratio     Endocrine   Type 2 diabetes mellitus without complication, without long-term current use of insulin (HCC)   Relevant Orders   Hemoglobin A1c   Urinalysis, Routine w reflex microscopic   Microalbumin / creatinine urine ratio   Ambulatory referral to Ophthalmology     Genitourinary   Benign prostatic hyperplasia with post-void dribbling   Relevant Orders   PSA     Other   Healthcare maintenance   Hyperlipidemia with target LDL less than 70   Relevant Orders   Lipid panel   Alcohol use   Relevant Orders   Ethyl Glucuronide, Urine   Vitamin B1   Receptive aphasia   Relevant Orders   Ambulatory referral to Neurology   Memory loss   Relevant Orders   Ambulatory referral to Neurology   Other Visit Diagnoses     Need for diphtheria-tetanus-pertussis (Tdap) vaccine       Relevant Orders   Tdap vaccine greater than or equal to 7yo IM (Completed)       No orders of the defined types were placed in this encounter.   Follow-up: Return in about 6 months (around 11/27/2021), or if symptoms worsen or fail to improve.  Information given on health maintenance and disease prevention for those over 75.  Advised that  his memory problems and aphasia could  be related to alcohol.  Asked him to stop drinking.  He will consider.  He would like to hear what the neurologist says.  Continue follow-up with cardiology.  Consider disc surgery with fusion to improve exercise tolerance.  Libby Maw, MD

## 2021-05-31 LAB — HEPATITIS C ANTIBODY
Hepatitis C Ab: NONREACTIVE
SIGNAL TO CUT-OFF: <0.02 (ref <1.00)

## 2021-06-04 ENCOUNTER — Other Ambulatory Visit: Payer: Self-pay

## 2021-06-04 ENCOUNTER — Ambulatory Visit: Payer: Medicare Other | Admitting: Physician Assistant

## 2021-06-04 ENCOUNTER — Encounter: Payer: Self-pay | Admitting: Physician Assistant

## 2021-06-04 VITALS — BP 120/66 | HR 67 | Ht 70.0 in | Wt 208.0 lb

## 2021-06-04 DIAGNOSIS — R4701 Aphasia: Secondary | ICD-10-CM | POA: Diagnosis not present

## 2021-06-04 DIAGNOSIS — Z789 Other specified health status: Secondary | ICD-10-CM

## 2021-06-04 DIAGNOSIS — R413 Other amnesia: Secondary | ICD-10-CM

## 2021-06-04 DIAGNOSIS — H52223 Regular astigmatism, bilateral: Secondary | ICD-10-CM | POA: Diagnosis not present

## 2021-06-04 LAB — ETHYL GLUCURONIDE LC/MS/MS
EtG/EtS LC/MS/MS: POSITIVE — AB
Ethyl Glucuronide LC/MS/MS: >50000 ng/mL
Ethyl Glucuronide: POSITIVE — AB
Ethyl Sulfate LC/MS/MS: >15000 ng/mL
Ethyl Sulfate: POSITIVE — AB

## 2021-06-04 LAB — ETHYL GLUCURONIDE, URINE

## 2021-06-04 LAB — HM DIABETES EYE EXAM

## 2021-06-04 NOTE — Patient Instructions (Addendum)
It was a pleasure to see you today at our office.   Recommendations:  Neurocognitive evaluation at our office MRI of the brain, the radiology office will call you to arrange you appointment Check labs today EEG Follow up 1 month and after the neurocognitive testing   RECOMMENDATIONS FOR ALL PATIENTS WITH MEMORY PROBLEMS: 1. Continue to exercise (Recommend 30 minutes of walking everyday, or 3 hours every week) 2. Increase social interactions - continue going to [place] and enjoy social gatherings with friends and family 3. Eat healthy, avoid fried foods and eat more fruits and vegetables 4. Maintain adequate blood pressure, blood sugar, and blood cholesterol level. Reducing the risk of stroke and cardiovascular disease also helps promoting better memory. 5. Avoid stressful situations. Live a simple life and avoid aggravations. Organize your time and prepare for the next day in anticipation. 6. Sleep well, avoid any interruptions of sleep and avoid any distractions in the bedroom that may interfere with adequate sleep quality 7. Avoid sugar, avoid sweets as there is a strong link between excessive sugar intake, diabetes, and cognitive impairment We discussed the Mediterranean diet, which has been shown to help patients reduce the risk of progressive memory disorders and reduces cardiovascular risk. This includes eating fish, eat fruits and green leafy vegetables, nuts like almonds and hazelnuts, walnuts, and also use olive oil. Avoid fast foods and fried foods as much as possible. Avoid sweets and sugar as sugar use has been linked to worsening of memory function.  There is always a concern of gradual progression of memory problems. If this is the case, then we may need to adjust level of care according to patient needs. Support, both to the patient and caregiver, should then be put into place.      You have been referred for a neuropsychological evaluation (i.e., evaluation of memory and  thinking abilities). Please bring someone with you to this appointment if possible, as it is helpful for the doctor to hear from both you and another adult who knows you well. Please bring eyeglasses and hearing aids if you wear them.    The evaluation will take approximately 3 hours and has two parts:   The first part is a clinical interview with the neuropsychologist (Dr. Melvyn Novas or Dr. Nicole Kindred). During the interview, the neuropsychologist will speak with you and the individual you brought to the appointment.    The second part of the evaluation is testing with the doctor's technician Hinton Dyer or Maudie Mercury). During the testing, the technician will ask you to remember different types of material, solve problems, and answer some questionnaires. Your family member will not be present for this portion of the evaluation.   Please note: We must reserve several hours of the neuropsychologist's time and the psychometrician's time for your evaluation appointment. As such, there is a No-Show fee of $100. If you are unable to attend any of your appointments, please contact our office as soon as possible to reschedule.    FALL PRECAUTIONS: Be cautious when walking. Scan the area for obstacles that may increase the risk of trips and falls. When getting up in the mornings, sit up at the edge of the bed for a few minutes before getting out of bed. Consider elevating the bed at the head end to avoid drop of blood pressure when getting up. Walk always in a well-lit room (use night lights in the walls). Avoid area rugs or power cords from appliances in the middle of the walkways. Use a walker  or a cane if necessary and consider physical therapy for balance exercise. Get your eyesight checked regularly.  FINANCIAL OVERSIGHT: Supervision, especially oversight when making financial decisions or transactions is also recommended.  HOME SAFETY: Consider the safety of the kitchen when operating appliances like stoves, microwave oven,  and blender. Consider having supervision and share cooking responsibilities until no longer able to participate in those. Accidents with firearms and other hazards in the house should be identified and addressed as well.   ABILITY TO BE LEFT ALONE: If patient is unable to contact 911 operator, consider using LifeLine, or when the need is there, arrange for someone to stay with patients. Smoking is a fire hazard, consider supervision or cessation. Risk of wandering should be assessed by caregiver and if detected at any point, supervision and safe proof recommendations should be instituted.  MEDICATION SUPERVISION: Inability to self-administer medication needs to be constantly addressed. Implement a mechanism to ensure safe administration of the medications.   DRIVING: Regarding driving, in patients with progressive memory problems, driving will be impaired. We advise to have someone else do the driving if trouble finding directions or if minor accidents are reported. Independent driving assessment is available to determine safety of driving.   If you are interested in the driving assessment, you can contact the following:  [employer] in [place]  [place] [phone] or [phone]    [place] refers to food and lifestyle choices that are based on the traditions of countries located on the [employer]. This way of eating has been shown to help prevent certain conditions and improve outcomes for people who have chronic diseases, like kidney disease and heart disease. What are tips for following this plan? Lifestyle  Cook and eat meals together with your family, when possible. Drink enough fluid to keep your urine clear or pale yellow. Be physically active every day. This includes: Aerobic exercise like running or swimming. Leisure  activities like gardening, walking, or housework. Get 7-8 hours of sleep each night. If recommended by your health care provider, drink red wine in moderation. This means 1 glass a day for nonpregnant women and 2 glasses a day for men. A glass of wine equals 5 oz (150 mL). Reading food labels  Check the serving size of packaged foods. For foods such as rice and pasta, the serving size refers to the amount of cooked product, not dry. Check the total fat in packaged foods. Avoid foods that have saturated fat or trans fats. Check the ingredients list for added sugars, such as corn syrup. Shopping  At the grocery store, buy most of your food from the areas near the walls of the store. This includes: Fresh fruits and vegetables (produce). Grains, beans, nuts, and seeds. Some of these may be available in unpackaged forms or large amounts (in bulk). Fresh seafood. Poultry and eggs. Low-fat dairy products. Buy whole ingredients instead of prepackaged foods. Buy fresh fruits and vegetables in-season from local farmers markets. Buy frozen fruits and vegetables in resealable bags. If you do not have access to quality fresh seafood, buy precooked frozen shrimp or canned fish, such as tuna, salmon, or sardines. Buy small amounts of raw or cooked vegetables, salads, or olives from the deli or salad bar at your store. Stock your pantry so you always have certain foods on hand, such as olive oil, canned tuna, canned tomatoes, rice, pasta, and beans.  Cooking  Cook foods with extra-virgin olive oil instead of using butter or other vegetable oils. Have meat as a side dish, and have vegetables or grains as your main dish. This means having meat in small portions or adding small amounts of meat to foods like pasta or stew. Use beans or vegetables instead of meat in common dishes like chili or lasagna. Experiment with different cooking methods. Try roasting or broiling vegetables instead of steaming or sauteing  them. Add frozen vegetables to soups, stews, pasta, or rice. Add nuts or seeds for added healthy fat at each meal. You can add these to yogurt, salads, or vegetable dishes. Marinate fish or vegetables using olive oil, lemon juice, garlic, and fresh herbs. Meal planning  Plan to eat 1 vegetarian meal one day each week. Try to work up to 2 vegetarian meals, if possible. Eat seafood 2 or more times a week. Have healthy snacks readily available, such as: Vegetable sticks with hummus. Greek yogurt. Fruit and nut trail mix. Eat balanced meals throughout the week. This includes: Fruit: 2-3 servings a day Vegetables: 4-5 servings a day Low-fat dairy: 2 servings a day Fish, poultry, or lean meat: 1 serving a day Beans and legumes: 2 or more servings a week Nuts and seeds: 1-2 servings a day Whole grains: 6-8 servings a day Extra-virgin olive oil: 3-4 servings a day Limit red meat and sweets to only a few servings a month What are my food choices? Mediterranean diet Recommended Grains: Whole-grain pasta. Brown rice. Bulgar wheat. Polenta. Couscous. Whole-wheat bread. [place]. Vegetables: Artichokes. Beets. Broccoli. Cabbage. Carrots. Eggplant. Green beans. Chard. Kale. Spinach. Onions. Leeks. Peas. Squash. Tomatoes. Peppers. Radishes. Fruits: Apples. Apricots. Avocado. Berries. Bananas. Cherries. Dates. Figs. Grapes. Lemons. Melon. Oranges. Peaches. Plums. Pomegranate. Meats and other protein foods: Beans. Almonds. Sunflower seeds. Pine nuts. Peanuts. [place]. Salmon. Scallops. Shrimp. [place]. Tilapia. Clams. Oysters. Eggs. Dairy: Low-fat milk. Cheese. Greek yogurt. Beverages: Water. Red wine. Herbal tea. Fats and oils: Extra virgin olive oil. Avocado oil. Grape seed oil. Sweets and desserts: [place] yogurt with honey. Baked apples. Poached pears. Trail mix. Seasoning and other foods: Basil. Cilantro. Coriander. Cumin. Mint. Parsley. Sage. Rosemary. Tarragon. Garlic. Oregano. Thyme. Pepper.  Balsalmic vinegar. Tahini. Hummus. Tomato sauce. Olives. Mushrooms. Limit these Grains: Prepackaged pasta or rice dishes. Prepackaged cereal with added sugar. Vegetables: Deep fried potatoes (french fries). Fruits: Fruit canned in syrup. Meats and other protein foods: Beef. Pork. Lamb. Poultry with skin. Hot dogs. Bernie[place] Salines. Dairy: Ice cream. Sour cream. Whole milk. Beverages: Juice. Sugar-sweetened soft drinks. Beer. Liquor and spirits. Fats and oils: Butter. Canola oil. Vegetable oil. Beef fat (tallow). Lard. Sweets and desserts: Cookies. Cakes. Pies. Candy. Seasoning and other foods: Mayonnaise. Premade sauces and marinades. The items listed may not be a complete list. Talk with your dietitian about what dietary choices are right for you. Summary The Mediterranean diet includes both food and lifestyle choices. Eat a variety of fresh fruits and vegetables, beans, nuts, seeds, and whole grains. Limit the amount of red meat and sweets that you eat. Talk with your health care provider about whether it is safe for you to drink red wine in moderation. This means 1 glass a day for nonpregnant women and 2 glasses a day for men. A glass of wine equals 5 oz (150 mL). This information is not intended to replace advice given to you by your health care provider. Make sure you discuss any questions you have with your health care provider. Document Released:  12/13/2015 Document Revised: 01/15/2016 Document Reviewed: 12/13/2015 Elsevier Interactive Patient Education  2017 [employer].

## 2021-06-04 NOTE — Progress Notes (Addendum)
Assessment/Plan:   Dakota Gibbs is a very pleasant 71 y.o. year old RH male with  a history of hypertension, hyperlipidemia, anxiety, diastolic heart failure, prior history of bradycardia, OSA on CPAP, diabetes, CAD status post MI, depression seen today for evaluation of memory loss. MoCA today is 26/30, with deficiencies in delayed recall 3/5 and visuospatial executive.  He also reports some episodes of "fogginess ", where he is awake, but unable to understand with the other person is saying although aware of it.  He also has a history of regular wine habituation.   Recommendations:   Memory Loss   MRI brain with/without contrast to assess for underlying structural abnormality and assess vascular load  Neurocognitive testing to further evaluate cognitive concerns and determine other underlying cause of memory changes, including potential contribution from sleep, anxiety, or depression  Check B12, TSH, B1 EEG, routine Discussed safety both in and out of the home.  Discussed the importance of regular daily schedule with inclusion of crossword puzzles to maintain brain function.  Continue to monitor mood with PCP.  Stay active at least 30 minutes at least 3 times a week.  Naps should be scheduled and should be no longer than 60 minutes and should not occur after 2 PM.  Control cardiovascular risk factors  Mediterranean diet is recommended  Folllow up in 1 month, and after neurocognitive testing    Subjective:    The patient is seen in neurologic consultation at the request of Libby Maw,* for the evaluation of memory and possible expressive and receptive aphasia.  The patient is here alone.  This is a 71 y.o. year old RH  male who has had memory issues for about 1 year, when he became aware that he could not remember people's names, neighbors names, and recently forgetting to run errands when he is aware he has to.  He states that at times, he cannot find the word that  he wants to say, hesitates, and then replaces it with another were.  His daughter was talking to him recently, a he reported that he was "looking at her, but could not comprehend what she was saying it was like blah blah".  He owns a  recruiting company, reporting that this memory issues have costing money, because he "cannot connect there dots as I used to".  He denies repeating the same stories or asking the same questions, or being confused when coming to a room.  He does leave objects in a secure area, and then cannot find it, he has to search.  He feels that he is hard to focus, he is more distracted, cannot recall as he used to about 6 months ago.  He forgets nouns, verbs, and if he uses a word, he cannot remember it in a short period of time.  He has become distracted when driving, and at times making the wrong turn.  His wife, who lives with him, has noticed the same behavior.  He denies any changes in mood, he has a history of depression, taking citalopram for the last 10 years, with significant help.  Prior to that, he would be "more nervous, and I would interrupt all the time".  He was never diagnosed with ADD or ADHD.  He does become irritable if he struggles with the word.  At times, he does some puzzles and word finding to help him, but he enjoys more "helping neighbors install things ".  He is fairly active, he plays some  golf "I am not that good ".  He sleeps about 11 hours a night, he has a CPAP for 1 year for sleep apnea.  He reports very vivid dreams, talking in his sleep, at times with REM behavior he is unaware of sleepwalking.  He does admit to hallucinations, seeing people who talk to him in the bed, and this has been going on for a long time.  He states that when he turns a light off, they are all gone and he goes back to sleep.  Usually this people are strangers.  His vivid dreams, people are known to him.  No auditory hallucinations.  No paranoia.  He denies any hygiene concerns, he is  independent of bathing and dressing.  He is in charge of his own finances, and medications, without missing any payments or doses.  His appetite is good, denies trouble swallowing.  He does not cook.  He had a couple of falls lately, falling from bed when sleeping.  6 months ago, he fell when going to the bathroom, and 2 weeks ago, he had a bruise in the hip after experiencing a syncopal episode, due to the CHF followed by cardiology.  He reports that in the past, he had bradycardia, but this is now better controlled by cardiology.  He has intermittent headaches, but not frequent.  He denies any double vision, dizziness, focal numbness or tingling, unilateral weakness, tremors or anosmia, or seizures.  He denies urine incontinence, retention, constipation or diarrhea, he does drink 3 or 4 glasses of wine a night, usually Chardonnay.  He does not smoke.  Family history negative for Alzheimer's disease.  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  Originally from [place].  No Known Allergies  Current Outpatient Medications  Medication Instructions   amLODipine (NORVASC) 5 mg, Oral, Daily   aspirin EC 81 mg, Oral, Daily   atorvastatin (LIPITOR) 40 MG tablet TAKE 1 TABLET EVERY DAY AT 6 PM   cetirizine (ZYRTEC) 10 mg, Daily   citalopram (CELEXA) 20 MG tablet TAKE 1 AND 1/2 TABLETS EVERY DAY (NEED MD APPOINTMENT FOR REFILLS)   ezetimibe (ZETIA) 10 MG tablet TAKE 1 TABLET EVERY DAY   FLUTICASONE PROPIONATE, NASAL, NA Every morning   hydrochlorothiazide (MICROZIDE) 12.5 MG capsule TAKE 2 CAPSULES EVERY OTHER DAY ALTERNATING WITH TAKE 1 CAPSULE EVERY OTHER DAY AS DIRECTED   metFORMIN (GLUCOPHAGE-XR) 500 MG 24 hr tablet TAKE 1 TABLET EVERY DAY WITH BREAKFAST   metoprolol succinate (TOPROL-XL) 12.5 mg, Oral, Daily, Take with or immediately following a meal.   metoprolol tartrate (LOPRESSOR) 25 MG tablet TAKE 1/2 TO  1 TABLET EVERY DAY  AS NEEDED (HEART PALPITATIONS).   Multiple Vitamin (MULTIVITAMIN) tablet 1 tablet, Oral, Daily,     nitroGLYCERIN (NITROSTAT) 0.4 mg, Sublingual, Every 5 min PRN   olmesartan (BENICAR) 20 MG tablet TAKE 1 TABLET EVERY DAY   oxymetazoline (AFRIN) 0.05 % nasal spray 1 spray, Each Nare, Daily   tadalafil (CIALIS) 20 MG tablet May take one or two up to every other day as needed for ed.   vitamin C (ASCORBIC ACID) 500 mg, Oral, Daily,     vitamin E 400 Units, Oral, Daily,       VITALS:   Vitals:   06/04/21 1435  BP: 120/66  Pulse: 67  SpO2: 95%  Weight: 208 lb (94.3 kg)  Height: 5\' 10"  (1.778 m)  PHYSICAL EXAM   HEENT:  Normocephalic, atraumatic. The mucous membranes are moist. The superficial temporal arteries are without ropiness or tenderness. Cardiovascular: Regular rate and rhythm. Lungs: Clear to auscultation bilaterally. Neck: There are no carotid bruits noted bilaterally.  NEUROLOGICAL: Montreal Cognitive Assessment  06/04/2021  Visuospatial/ Executive (0/5) 3  Naming (0/3) 3  Attention: Read list of digits (0/2) 2  Attention: Read list of letters (0/1) 1  Attention: Serial 7 subtraction starting at 100 (0/3) 3  Language: Repeat phrase (0/2) 2  Language : Fluency (0/1) 1  Abstraction (0/2) 2  Delayed Recall (0/5) 3  Orientation (0/6) 6  Total 26  Adjusted Score (based on education) 26   No flowsheet data found.  No flowsheet data found.   Orientation:  Alert and oriented to person, place and time. No aphasia during this visit or dysarthria. Fund of knowledge is appropriate. Recent memory impaired and remote memory intact.  Attention and concentration are normal.  Able to name objects and repeat phrases. Delayed recall 3/5  Cranial nerves: There is good facial symmetry. Extraocular muscles are intact and visual fields are full to confrontational testing. Speech is fluent and clear. Soft palate rises symmetrically and there is no tongue  deviation. Hearing is intact to conversational tone. Tone: Tone is good throughout. Sensation: Sensation is intact to light touch and pinprick throughout. Vibration is intact at the bilateral big toe.There is no extinction with double simultaneous stimulation. There is no sensory dermatomal level identified. Coordination: The patient has no difficulty with RAM's or FNF bilaterally. Normal finger to nose  Motor: Strength is 5/5 in the bilateral upper and lower extremities. There is no pronator drift. There are no fasciculations noted. DTR's: Deep tendon reflexes are1/4 at the bilateral biceps, triceps, brachioradialis, patella and achilles.  Plantar responses are downgoing bilaterally. Gait and Station: The patient is able to ambulate without difficulty.The patient is able to heel toe walk without any difficulty.The patient is able to ambulate in a tandem fashion. The patient is able to stand in the Romberg position.     Thank you for allowing [place] the opportunity to participate in the care of this nice patient. Please do not hesitate to contact us for any questions or concerns.   Total time spent on today's visit was 60 minutes, including both face-to-face time and nonface-to-face time.  Time included that spent on review of records (prior notes available to me/labs/imaging if pertinent), discussing treatment and goals, answering patient's questions and coordinating care.  Cc:  Libby Maw, MD  Sharene Butters 06/04/2021 3:19 PM

## 2021-06-06 LAB — VITAMIN B1: Vitamin B1 (Thiamine): 19 nmol/L (ref 8–30)

## 2021-06-10 ENCOUNTER — Telehealth (INDEPENDENT_AMBULATORY_CARE_PROVIDER_SITE_OTHER): Payer: Medicare Other | Admitting: Family Medicine

## 2021-06-10 ENCOUNTER — Encounter: Payer: Self-pay | Admitting: Family Medicine

## 2021-06-10 ENCOUNTER — Ambulatory Visit: Payer: Medicare Other | Admitting: Family Medicine

## 2021-06-10 VITALS — Ht 70.0 in | Wt 208.0 lb

## 2021-06-10 DIAGNOSIS — R8290 Unspecified abnormal findings in urine: Secondary | ICD-10-CM

## 2021-06-10 NOTE — Progress Notes (Signed)
Established Patient Office Visit  Subjective:  Patient ID: Dakota Gibbs, male    DOB: 1950-06-14  Age: 71 y.o. MRN: [id number]  CC:  Chief Complaint  Patient presents with   Advice Only    Discuss labs    HPI Dakota Gibbs presents for follow-up of labs.  Urine ETG was remarkably elevated.  At last clinic visit patient maintained that he consumes 3 glasses of wine nightly and no more.  He reminded me that he has been on a cruise within the 2-week period before the test was drawn.  Admits to heavy drinking at that time.  He asks about the elevated urobilinogen level in his urine.  AST and ALT were normal.  Under work-up through neurology with upcoming MRI of brain and EEG.  Past Medical History:  Diagnosis Date   Allergy    enviornmental   Anxiety    Anxiety and depression 10/25/2012   Arthritis    self dx   Back pain    L4-L5 bulging disc, L5-S1 - bulging disc, pinched nerve in neck   CAD (coronary artery disease)      Dr Claiborne Billings, s/p stents----- sees cards yearly    Cancer [facility])    basal cell; carcinoma removed x3 and SCC   Cataract    bilateral sx   Depression    Diabetes mellitus    pt denies   GERD (gastroesophageal reflux disease)    on meds   History of shingles 2009   Hyperlipidemia    on meds   Hypertension    on meds   Myocardial infarction ([place]) 1997   caused by a blood clot   Sleep apnea    not have a CPAP as of 02/27/2020-just dx    Past Surgical History:  Procedure Laterality Date   ANGIOPLASTY  1997   basel cell     skin carcinoma removed x 4   CARDIAC CATHETERIZATION     10/04/1999   COLONOSCOPY  2018   SA-MAC-suprep(good)-TA   DOPPLER ECHOCARDIOGRAPHY  07/09/2006   EF 50 to 55 %, LA mildy dilated   HERNIA REPAIR  1951   RIGHT   KNEE ARTHROSCOPY Right 2001   R   NM MYOCAR PERF WALL MOTION  08/22/2011   Mets 13,low risk study   POLYPECTOMY  2018   TA   WRIST SURGERY  1984   right    Family History  Problem Relation Age of  Onset   Stroke Mother        TIA   COPD Mother    Hypertension Sister    Prostate cancer Father 21   Coronary artery disease Father        GM   Diabetes Brother    Prostate cancer Brother 36   Diabetes Maternal Grandmother    Colon cancer Neg Hx    Esophageal cancer Neg Hx    Colon polyps Neg Hx    Rectal cancer Neg Hx    Stomach cancer Neg Hx     Social History   Socioeconomic History   Marital status: Married    Spouse name: Not on file   Number of children: 2   Years of education: Not on file   Highest education level: Not on file  Occupational History   Occupation: executive recruter   Tobacco Use   Smoking status: Former    Types: Cigars    Quit date: 09/2020    Years since quitting: 0.7   Smokeless  tobacco: Never  Vaping Use   Vaping Use: Never used  Substance and Sexual Activity   Alcohol use: Yes    Alcohol/week: 21.0 standard drinks    Types: 21 Glasses of wine per week    Comment:  3 glasses wine / night   Drug use: No   Sexual activity: Yes  Other Topics Concern   Not on file  Social History Narrative   Lives w/ wife      Right Handed    Lives in a two story home   Drinks two cups of coffee daily      Social Determinants of Health   Financial Resource Strain: Not on file  Food Insecurity: Not on file  Transportation Needs: Not on file  Physical Activity: Not on file  Stress: Not on file  Social Connections: Not on file  Intimate Partner Violence: Not on file    Outpatient Medications Prior to Visit  Medication Sig Dispense Refill   amLODipine (NORVASC) 5 MG tablet Take 1 tablet (5 mg total) by mouth daily. 90 tablet 2   aspirin EC 81 MG tablet Take 1 tablet (81 mg total) by mouth daily. 90 tablet 3   atorvastatin (LIPITOR) 40 MG tablet TAKE 1 TABLET EVERY DAY AT 6 PM 90 tablet 3   cetirizine (ZYRTEC) 10 MG tablet Take 10 mg by mouth daily.     citalopram (CELEXA) 20 MG tablet TAKE 1 AND 1/2 TABLETS EVERY DAY (NEED MD APPOINTMENT FOR  REFILLS) 135 tablet 1   ezetimibe (ZETIA) 10 MG tablet TAKE 1 TABLET EVERY DAY 90 tablet 3   FLUTICASONE PROPIONATE, NASAL, NA in the morning.     hydrochlorothiazide (MICROZIDE) 12.5 MG capsule TAKE 2 CAPSULES EVERY OTHER DAY ALTERNATING WITH TAKE 1 CAPSULE EVERY OTHER DAY AS DIRECTED 135 capsule 3   metFORMIN (GLUCOPHAGE-XR) 500 MG 24 hr tablet TAKE 1 TABLET EVERY DAY WITH BREAKFAST 90 tablet 1   metoprolol succinate (TOPROL-XL) 25 MG 24 hr tablet Take 0.5 tablets (12.5 mg total) by mouth daily. Take with or immediately following a meal. 90 tablet 1   metoprolol tartrate (LOPRESSOR) 25 MG tablet TAKE 1/2 TO 1 TABLET EVERY DAY  AS NEEDED (HEART PALPITATIONS). 90 tablet 3   Multiple Vitamin (MULTIVITAMIN) tablet Take 1 tablet by mouth daily.     nitroGLYCERIN (NITROSTAT) 0.4 MG SL tablet Place 1 tablet (0.4 mg total) under the tongue every 5 (five) minutes as needed for chest pain. 25 tablet 1   olmesartan (BENICAR) 20 MG tablet TAKE 1 TABLET EVERY DAY 90 tablet 0   oxymetazoline (AFRIN) 0.05 % nasal spray Place 1 spray into both nostrils daily.     tadalafil (CIALIS) 20 MG tablet May take one or two up to every other day as needed for ed. 10 tablet 3   vitamin C (ASCORBIC ACID) 500 MG tablet Take 500 mg by mouth daily.     vitamin E 400 UNIT capsule Take 400 Units by mouth daily.     No facility-administered medications prior to visit.    No Known Allergies  ROS Review of Systems  Constitutional: Negative.   Respiratory: Negative.    Cardiovascular: Negative.   Gastrointestinal: Negative.  Negative for abdominal pain, anal bleeding, blood in stool, nausea and vomiting.  Genitourinary: Negative.   Neurological: Negative.   Psychiatric/Behavioral: Negative.       Objective:    Physical Exam Nursing note reviewed.  Constitutional:      General: He is not in  acute distress. Pulmonary:     Effort: Pulmonary effort is normal.  Abdominal:     Tenderness: There is no rebound.   Neurological:     Mental Status: He is oriented to person, place, and time.  Psychiatric:        Mood and Affect: Mood normal.        Behavior: Behavior normal.    Ht _0  (1.778 m)    Wt 208 lb (94.3 kg)    BMI 29.84 kg/m  Wt Readings from Last 3 Encounters:  06/10/21 208 lb (94.3 kg)  06/04/21 208 lb (94.3 kg)  05/30/21 206 lb 3.2 oz (93.5 kg)     Health Maintenance Due  Topic Date Due   FOOT EXAM  02/04/2017   OPHTHALMOLOGY EXAM  10/20/2019    There are no preventive care reminders to display for this patient.  Lab Results  Component Value Date   TSH 1.44 08/03/2020   Lab Results  Component Value Date   WBC 6.6 05/30/2021   HGB 14.3 05/30/2021   HCT 42.8 05/30/2021   MCV 95.3 05/30/2021   PLT 242.0 05/30/2021   Lab Results  Component Value Date   NA 138 05/30/2021   K 5.0 05/30/2021   CO2 28 05/30/2021   GLUCOSE 114 (H) 05/30/2021   BUN 17 05/30/2021   CREATININE 1.07 05/30/2021   BILITOT 0.8 05/30/2021   ALKPHOS 81 05/30/2021   AST 20 05/30/2021   ALT 21 05/30/2021   PROT 7.0 05/30/2021   ALBUMIN 4.2 05/30/2021   CALCIUM 9.4 05/30/2021   GFR 69.31 05/30/2021   Lab Results  Component Value Date   CHOL 137 05/30/2021   Lab Results  Component Value Date   HDL 60.30 05/30/2021   Lab Results  Component Value Date   LDLCALC 47 05/30/2021   Lab Results  Component Value Date   TRIG 152.0 (H) 05/30/2021   Lab Results  Component Value Date   CHOLHDL 2 05/30/2021   Lab Results  Component Value Date   HGBA1C 6.2 05/30/2021      Assessment & Plan:   Problem List Items Addressed This Visit       Other   Abnormal urinalysis - Primary   Relevant Orders   US ABDOMEN COMPLETE W/ELASTOGRAPHY    No orders of the defined types were placed in this encounter.   Follow-up: No follow-ups on file.    Libby Maw, MD  Virtual Visit via Telephone Note  I connected with Dakota Gibbs on 06/10/21 at  3:00 PM EST by  telephone and verified that I am speaking with the correct person using two identifiers.  Location: Patient: home alone in his office.  Provider: work   I discussed the limitations, risks, security and privacy concerns of performing an evaluation and management service by telephone and the availability of in person appointments. I also discussed with the patient that there may be a patient responsible charge related to this service. The patient expressed understanding and agreed to proceed.   History of Present Illness:    Observations/Objective:   Assessment and Plan:   Follow Up Instructions:    I discussed the assessment and treatment plan with the patient. The patient was provided an opportunity to ask questions and all were answered. The patient agreed with the plan and demonstrated an understanding of the instructions.   The patient was advised to call back or seek an in-person evaluation if the symptoms worsen or if the  condition fails to improve as anticipated.  I provided 25 minutes of non-face-to-face time during this encounter.   Libby Maw, MD   Interactive video and audio telecommunications were attempted between myself and the patient. However they failed due to the patient having technical difficulties or not having access to video capability. We continued and completed with audio only.

## 2021-06-11 ENCOUNTER — Encounter: Payer: Self-pay | Admitting: Family Medicine

## 2021-06-11 ENCOUNTER — Other Ambulatory Visit: Payer: Self-pay

## 2021-06-11 ENCOUNTER — Other Ambulatory Visit (INDEPENDENT_AMBULATORY_CARE_PROVIDER_SITE_OTHER): Payer: Medicare Other

## 2021-06-11 DIAGNOSIS — R413 Other amnesia: Secondary | ICD-10-CM | POA: Diagnosis not present

## 2021-06-11 DIAGNOSIS — R8290 Unspecified abnormal findings in urine: Secondary | ICD-10-CM

## 2021-06-12 ENCOUNTER — Encounter: Payer: Self-pay | Admitting: Family Medicine

## 2021-06-12 LAB — TSH: TSH: 2.43 u[IU]/mL (ref 0.450–4.500)

## 2021-06-12 LAB — SPECIMEN STATUS REPORT

## 2021-06-12 LAB — VITAMIN B12: Vitamin B-12: 768 pg/mL (ref 232–1245)

## 2021-06-12 LAB — VITAMIN B1

## 2021-06-21 ENCOUNTER — Other Ambulatory Visit: Payer: Self-pay

## 2021-06-21 ENCOUNTER — Ambulatory Visit
Admission: RE | Admit: 2021-06-21 | Discharge: 2021-06-21 | Disposition: A | Discharge status: Home / Self Care | Payer: Medicare Other | Source: Ambulatory Visit | Attending: Physician Assistant | Admitting: Physician Assistant

## 2021-06-21 DIAGNOSIS — R413 Other amnesia: Secondary | ICD-10-CM | POA: Diagnosis not present

## 2021-06-21 DIAGNOSIS — G319 Degenerative disease of nervous system, unspecified: Secondary | ICD-10-CM | POA: Diagnosis not present

## 2021-06-21 DIAGNOSIS — I6782 Cerebral ischemia: Secondary | ICD-10-CM | POA: Diagnosis not present

## 2021-06-27 ENCOUNTER — Ambulatory Visit (HOSPITAL_COMMUNITY)
Admission: RE | Admit: 2021-06-27 | Discharge: 2021-06-27 | Disposition: A | Discharge status: Home / Self Care | Payer: Medicare Other | Source: Ambulatory Visit | Attending: Family Medicine | Admitting: Family Medicine

## 2021-06-27 ENCOUNTER — Other Ambulatory Visit: Payer: Self-pay

## 2021-06-27 DIAGNOSIS — K828 Other specified diseases of gallbladder: Secondary | ICD-10-CM | POA: Diagnosis not present

## 2021-06-27 DIAGNOSIS — R8290 Unspecified abnormal findings in urine: Secondary | ICD-10-CM | POA: Diagnosis not present

## 2021-06-27 DIAGNOSIS — K7689 Other specified diseases of liver: Secondary | ICD-10-CM | POA: Diagnosis not present

## 2021-07-01 ENCOUNTER — Ambulatory Visit: Payer: Medicare Other | Admitting: Neurology

## 2021-07-01 ENCOUNTER — Other Ambulatory Visit: Payer: Self-pay

## 2021-07-01 DIAGNOSIS — R413 Other amnesia: Secondary | ICD-10-CM | POA: Diagnosis not present

## 2021-07-03 DIAGNOSIS — E119 Type 2 diabetes mellitus without complications: Secondary | ICD-10-CM | POA: Diagnosis not present

## 2021-07-03 DIAGNOSIS — H26491 Other secondary cataract, right eye: Secondary | ICD-10-CM | POA: Diagnosis not present

## 2021-07-03 DIAGNOSIS — Z961 Presence of intraocular lens: Secondary | ICD-10-CM | POA: Diagnosis not present

## 2021-07-03 DIAGNOSIS — I10 Essential (primary) hypertension: Secondary | ICD-10-CM | POA: Diagnosis not present

## 2021-07-04 ENCOUNTER — Telehealth: Payer: Medicare Other | Admitting: Family Medicine

## 2021-07-04 ENCOUNTER — Encounter: Payer: Self-pay | Admitting: Physician Assistant

## 2021-07-08 NOTE — Progress Notes (Signed)
Please inform pt that the EEG is normal, no seizures thanks

## 2021-07-08 NOTE — Procedures (Signed)
ELECTROENCEPHALOGRAM REPORT  Date of Study: 07/01/2021  Patient's Name: Dakota Gibbs MRN: [id number] Date of Birth: March 17, 1951  Referring Provider: Sharene Butters, PA-C  Clinical History: This is a 71 year old man with episodes of fogginess, unable to comprehend. EEG for classification.  Medications: NORVASC  5 mg aspirin EC  81 mg LIPITOR 40 MG tablet ZYRTEC  10 mg CELEXA 20 MG tablet   ZETIA 10 MG tablet FLUTICASONE PROPIONATE, NASAL MICROZIDE 12.5 MG capsule GLUCOPHAGE-XR 500 MG 24 hr tablet TOPROL-XL  12.5 mg LOPRESSOR 25 MG tablet MULTIVITAMIN tablet NITROSTAT 0.4 mg, Sublingual BENICAR 20 MG tablet AFRIN 0.05 % nasal spray CIALIS 20 MG tablet ASCORBIC ACID  500 mg vitamin E  400 Units  Technical Summary: A multichannel digital EEG recording measured by the international 10-20 system with electrodes applied with paste and impedances below 5000 ohms performed in our laboratory with EKG monitoring in an awake and asleep patient.  Hyperventilation was not performed. Photic stimulation was performed.  The digital EEG was referentially recorded, reformatted, and digitally filtered in a variety of bipolar and referential montages for optimal display.    Description: The patient is awake and asleep during the recording.  During maximal wakefulness, there is a symmetric, medium voltage 10 Hz posterior dominant rhythm that attenuates with eye opening.  The record is symmetric.  During drowsiness and sleep, there is an increase in theta slowing of the background.  Vertex waves and symmetric sleep spindles were seen.  Photic stimulation did not elicit any abnormalities.  There were no epileptiform discharges or electrographic seizures seen.    EKG lead showed occasional irregular rhythm.  Impression: This awake and asleep EEG is normal.    Clinical Correlation: A normal EEG does not exclude a clinical diagnosis of epilepsy.  If further clinical questions remain, prolonged EEG  may be helpful.  Clinical correlation is advised.   Ellouise Newer, M.D.

## 2021-07-17 ENCOUNTER — Other Ambulatory Visit: Payer: Self-pay

## 2021-07-17 DIAGNOSIS — F32A Depression, unspecified: Secondary | ICD-10-CM

## 2021-07-17 NOTE — Telephone Encounter (Signed)
Refill request for pending Rx last refill 01/03/21 last OV 05/30/21. Please advise  ?

## 2021-07-18 ENCOUNTER — Other Ambulatory Visit: Payer: Self-pay | Admitting: Family Medicine

## 2021-07-21 MED ORDER — CITALOPRAM HYDROBROMIDE 20 MG PO TABS: ORAL_TABLET | ORAL | 1 refills | Status: DC

## 2021-10-01 ENCOUNTER — Ambulatory Visit (INDEPENDENT_AMBULATORY_CARE_PROVIDER_SITE_OTHER): Payer: Medicare Other | Admitting: Family Medicine

## 2021-10-01 ENCOUNTER — Encounter: Payer: Self-pay | Admitting: Family Medicine

## 2021-10-01 VITALS — BP 116/68 | HR 54 | Temp 97.6°F | Ht 70.0 in | Wt 197.4 lb

## 2021-10-01 DIAGNOSIS — G5603 Carpal tunnel syndrome, bilateral upper limbs: Secondary | ICD-10-CM

## 2021-10-01 NOTE — Progress Notes (Signed)
Established Patient Office Visit  Subjective   Patient ID: Dakota Gibbs, male    DOB: Oct 29, 1950  Age: 70 y.o. MRN: [id number]  Chief Complaint  Patient presents with   Carpal Tunnel    Possible carpal tunnel fingers very painful and numb becoming worse.     HPI longstanding history of numbness tingling in the fingers of both hands.  Right seems to affected more so than the left.  Right-hand-dominant.  Works on the computer a lot.  It has been waking him up at night.  Has had some difficulty buttoning his shirt.  Has been wearing wrist splints without much help.  Seems to be worsening.  There is been pain down at the base of his right thumb.  Second and third fingers seem to be affected most.  Has been able to lose some weight and has cut back significantly on his drinking.  Feeling much better.    Review of Systems  Constitutional: Negative.   HENT: Negative.    Eyes:  Negative for blurred vision, discharge and redness.  Respiratory: Negative.    Cardiovascular: Negative.   Gastrointestinal:  Negative for abdominal pain.  Genitourinary: Negative.   Musculoskeletal: Negative.  Negative for joint pain, myalgias and neck pain.  Skin:  Negative for rash.  Neurological:  Positive for tingling. Negative for loss of consciousness and weakness.  Endo/Heme/Allergies:  Negative for polydipsia.     Objective:     BP 116/68 (BP Location: Right Arm, Patient Position: Sitting, Cuff Size: Normal)   Pulse (!) 54   Temp 97.6 F (36.4 C) (Temporal)   Ht '5\' 10"'$  (1.778 m)   Wt 197 lb 6.4 oz (89.5 kg)   SpO2 96%   BMI 28.32 kg/m    Physical Exam Constitutional:      General: He is not in acute distress.    Appearance: Normal appearance. He is not ill-appearing, toxic-appearing or diaphoretic.  HENT:     Head: Normocephalic and atraumatic.     Right Ear: External ear normal.     Left Ear: External ear normal.  Eyes:     General: No scleral icterus.       Right eye: No  discharge.        Left eye: No discharge.     Extraocular Movements: Extraocular movements intact.     Conjunctiva/sclera: Conjunctivae normal.  Pulmonary:     Effort: No respiratory distress.  Musculoskeletal:     Right hand: No swelling. Normal range of motion. Normal strength. Normal pulse.     Left hand: No swelling. Normal range of motion. Normal strength. Normal pulse.     Cervical back: No tenderness.     Comments: Opposes digits with 5/5 strength.  Negative Tinel and Phalen's test.  Grind test of right thumb produces mild pain in the first [facility] joint.  Skin:    General: Skin is warm and dry.  Neurological:     Mental Status: He is alert and oriented to person, place, and time.  Psychiatric:        Mood and Affect: Mood normal.        Behavior: Behavior normal.     No results found for any visits on 10/01/21.    The 10-year ASCVD risk score (Arnett DK, et al., 2019) is: 28.8%    Assessment & Plan:   Problem List Items Addressed This Visit       Nervous and Auditory   Bilateral carpal tunnel syndrome - Primary  Relevant Orders   Ambulatory referral to Orthopedic Surgery    No follow-ups on file.    Libby Maw, MD

## 2021-10-24 DIAGNOSIS — G5603 Carpal tunnel syndrome, bilateral upper limbs: Secondary | ICD-10-CM | POA: Diagnosis not present

## 2021-10-31 ENCOUNTER — Encounter: Payer: Self-pay | Admitting: Family Medicine

## 2021-11-03 ENCOUNTER — Encounter: Payer: Self-pay | Admitting: Neurology

## 2021-11-04 DIAGNOSIS — G5603 Carpal tunnel syndrome, bilateral upper limbs: Secondary | ICD-10-CM | POA: Diagnosis not present

## 2021-11-14 DIAGNOSIS — G5603 Carpal tunnel syndrome, bilateral upper limbs: Secondary | ICD-10-CM | POA: Diagnosis not present

## 2021-11-22 DIAGNOSIS — G5602 Carpal tunnel syndrome, left upper limb: Secondary | ICD-10-CM | POA: Diagnosis not present

## 2021-11-25 ENCOUNTER — Other Ambulatory Visit: Payer: Self-pay | Admitting: Family Medicine

## 2021-11-25 DIAGNOSIS — E1142 Type 2 diabetes mellitus with diabetic polyneuropathy: Secondary | ICD-10-CM

## 2021-11-27 ENCOUNTER — Ambulatory Visit: Payer: Medicare Other | Admitting: Family Medicine

## 2021-12-03 ENCOUNTER — Encounter: Payer: Self-pay | Admitting: Family Medicine

## 2021-12-03 ENCOUNTER — Ambulatory Visit (INDEPENDENT_AMBULATORY_CARE_PROVIDER_SITE_OTHER): Payer: Medicare Other | Admitting: Family Medicine

## 2021-12-03 ENCOUNTER — Ambulatory Visit (INDEPENDENT_AMBULATORY_CARE_PROVIDER_SITE_OTHER): Payer: Medicare Other

## 2021-12-03 VITALS — BP 130/56 | HR 48 | Temp 96.8°F | Ht 70.0 in | Wt 193.0 lb

## 2021-12-03 DIAGNOSIS — R1032 Left lower quadrant pain: Secondary | ICD-10-CM | POA: Diagnosis not present

## 2021-12-03 DIAGNOSIS — M1612 Unilateral primary osteoarthritis, left hip: Secondary | ICD-10-CM | POA: Diagnosis not present

## 2021-12-03 DIAGNOSIS — R7303 Prediabetes: Secondary | ICD-10-CM | POA: Insufficient documentation

## 2021-12-03 DIAGNOSIS — G5602 Carpal tunnel syndrome, left upper limb: Secondary | ICD-10-CM | POA: Diagnosis not present

## 2021-12-03 DIAGNOSIS — E785 Hyperlipidemia, unspecified: Secondary | ICD-10-CM

## 2021-12-03 DIAGNOSIS — M1611 Unilateral primary osteoarthritis, right hip: Secondary | ICD-10-CM | POA: Diagnosis not present

## 2021-12-03 DIAGNOSIS — I10 Essential (primary) hypertension: Secondary | ICD-10-CM | POA: Diagnosis not present

## 2021-12-03 DIAGNOSIS — Z4789 Encounter for other orthopedic aftercare: Secondary | ICD-10-CM | POA: Diagnosis not present

## 2021-12-03 LAB — CBC
HCT: 40.1 % (ref 39.0–52.0)
Hemoglobin: 13.6 g/dL (ref 13.0–17.0)
MCHC: 33.9 g/dL (ref 30.0–36.0)
MCV: 95.1 fl (ref 78.0–100.0)
Platelets: 232 10*3/uL (ref 150.0–400.0)
RBC: 4.21 Mil/uL — ABNORMAL LOW (ref 4.22–5.81)
RDW: 12.9 % (ref 11.5–15.5)
WBC: 6.4 10*3/uL (ref 4.0–10.5)

## 2021-12-03 LAB — COMPREHENSIVE METABOLIC PANEL
ALT: 14 U/L (ref 0–53)
AST: 15 U/L (ref 0–37)
Albumin: 4.1 g/dL (ref 3.5–5.2)
Alkaline Phosphatase: 58 U/L (ref 39–117)
BUN: 15 mg/dL (ref 6–23)
CO2: 28 mEq/L (ref 19–32)
Calcium: 9.3 mg/dL (ref 8.4–10.5)
Chloride: 100 mEq/L (ref 96–112)
Creatinine, Ser: 0.91 mg/dL (ref 0.40–1.50)
GFR: 83.88 mL/min (ref >60.00)
Glucose, Bld: 113 mg/dL — ABNORMAL HIGH (ref 70–99)
Potassium: 4.6 mEq/L (ref 3.5–5.1)
Sodium: 135 mEq/L (ref 135–145)
Total Bilirubin: 0.6 mg/dL (ref 0.2–1.2)
Total Protein: 6.8 g/dL (ref 6.0–8.3)

## 2021-12-03 LAB — HEMOGLOBIN A1C: Hgb A1c MFr Bld: 6.6 % — ABNORMAL HIGH (ref 4.6–6.5)

## 2021-12-03 NOTE — Progress Notes (Unsigned)
Initial visit for bilateral hip and groin pain. Most pain felt in LEFT hip. Pt has normal ROM Denies injury

## 2021-12-03 NOTE — Progress Notes (Signed)
Established Patient Office Visit  Subjective   Patient ID: Dakota Gibbs, male    DOB: 14-May-1950  Age: 71 y.o. MRN: [id number]  Chief Complaint  Patient presents with   Follow-up    6 mo f/u. Review scan test results. Intermittent shooting pain in L groin.     HPI follow-up of hypertension and prediabetes and alcohol use.  Patient has stopped drinking entirely in the night lose 15 pounds intentionally.  Feels great.  Exercises easier for him now.  There is less joint and back pain.  He has experienced some pain in the left groin.  It responds to Aspercreme.      Review of Systems  Constitutional: Negative.   HENT: Negative.    Eyes:  Negative for blurred vision, discharge and redness.  Respiratory: Negative.    Cardiovascular: Negative.   Gastrointestinal:  Negative for abdominal pain.  Genitourinary: Negative.   Musculoskeletal: Negative.  Negative for myalgias.  Skin:  Negative for rash.  Neurological:  Negative for tingling, loss of consciousness and weakness.  Endo/Heme/Allergies:  Negative for polydipsia.      Objective:     BP (!) 130/56 (BP Location: Left Arm, Patient Position: Sitting, Cuff Size: Normal)   Pulse (!) 48   Temp (!) 96.8 F (36 C) (Temporal)   Ht '5\' 10"'$  (1.778 m)   Wt 193 lb (87.5 kg)   SpO2 91%   BMI 27.69 kg/m  Wt Readings from Last 3 Encounters:  12/03/21 193 lb (87.5 kg)  10/01/21 197 lb 6.4 oz (89.5 kg)  06/10/21 208 lb (94.3 kg)      Physical Exam Constitutional:      General: He is not in acute distress.    Appearance: Normal appearance. He is not ill-appearing, toxic-appearing or diaphoretic.  HENT:     Head: Normocephalic and atraumatic.     Right Ear: External ear normal.     Left Ear: External ear normal.  Eyes:     General: No scleral icterus.       Right eye: No discharge.        Left eye: No discharge.     Extraocular Movements: Extraocular movements intact.     Conjunctiva/sclera: Conjunctivae normal.   Pulmonary:     Effort: Pulmonary effort is normal. No respiratory distress.  Abdominal:     Hernia: There is no hernia in the left inguinal area or right inguinal area.  Genitourinary:    Penis: No hypospadias, erythema, tenderness, discharge or swelling.      Testes:        Right: Mass, tenderness or swelling not present. Right testis is descended.        Left: Mass, tenderness or swelling not present. Left testis is descended.     Epididymis:     Right: Not inflamed or enlarged.     Left: Not inflamed or enlarged.  Musculoskeletal:     Right hip: Normal range of motion.     Left hip: Normal range of motion.     Right upper leg: Normal.     Left upper leg: Normal.     Right lower leg: Normal.     Left lower leg: Normal.       Legs:  Lymphadenopathy:     Lower Body: No right inguinal adenopathy. No left inguinal adenopathy.  Skin:    General: Skin is warm and dry.  Neurological:     Mental Status: He is alert and oriented to person, place, and  time.  Psychiatric:        Mood and Affect: Mood normal.        Behavior: Behavior normal.      No results found for any visits on 12/03/21.    The 10-year ASCVD risk score (Arnett DK, et al., 2019) is: 36.6%    Assessment & Plan:   Problem List Items Addressed This Visit       Cardiovascular and Mediastinum   Essential hypertension - Primary   Relevant Orders   CBC   Comprehensive metabolic panel     Other   Hyperlipidemia with target LDL less than 70   Relevant Orders   Comprehensive metabolic panel   Prediabetes   Relevant Orders   Hemoglobin A1c   Comprehensive metabolic panel   Other Visit Diagnoses     Groin pain, left       Relevant Orders   DG Hip Unilat W OR W/O Pelvis 2-3 Views Left   DG Hip Unilat W OR W/O Pelvis 2-3 Views Right       Return in about 6 months (around 06/05/2022), or if symptoms worsen or fail to improve.  Continue weight loss efforts and keep up the good work.  Continue with  sobriety.  Follow-up in 6 months or sooner.  Groin pain worsens.  Continue all medicines as well.  Libby Maw, MD

## 2021-12-17 DIAGNOSIS — G5601 Carpal tunnel syndrome, right upper limb: Secondary | ICD-10-CM | POA: Diagnosis not present

## 2021-12-19 ENCOUNTER — Telehealth: Payer: Self-pay | Admitting: Family Medicine

## 2021-12-19 NOTE — Telephone Encounter (Signed)
error 

## 2021-12-24 DIAGNOSIS — M25562 Pain in left knee: Secondary | ICD-10-CM | POA: Diagnosis not present

## 2021-12-26 ENCOUNTER — Encounter: Payer: Medicare Other | Admitting: Psychology

## 2021-12-26 DIAGNOSIS — G5601 Carpal tunnel syndrome, right upper limb: Secondary | ICD-10-CM | POA: Diagnosis not present

## 2021-12-31 ENCOUNTER — Other Ambulatory Visit: Payer: Self-pay | Admitting: Family Medicine

## 2021-12-31 DIAGNOSIS — I10 Essential (primary) hypertension: Secondary | ICD-10-CM

## 2022-01-01 ENCOUNTER — Other Ambulatory Visit: Payer: Self-pay | Admitting: Family Medicine

## 2022-01-01 DIAGNOSIS — I10 Essential (primary) hypertension: Secondary | ICD-10-CM

## 2022-01-02 ENCOUNTER — Encounter: Payer: Medicare Other | Admitting: Psychology

## 2022-01-28 ENCOUNTER — Ambulatory Visit: Payer: Medicare Other | Admitting: Physician Assistant

## 2022-02-04 DIAGNOSIS — G5601 Carpal tunnel syndrome, right upper limb: Secondary | ICD-10-CM | POA: Diagnosis not present

## 2022-02-04 DIAGNOSIS — G5602 Carpal tunnel syndrome, left upper limb: Secondary | ICD-10-CM | POA: Diagnosis not present

## 2022-02-04 DIAGNOSIS — M25511 Pain in right shoulder: Secondary | ICD-10-CM | POA: Diagnosis not present

## 2022-02-05 ENCOUNTER — Encounter: Payer: Self-pay | Admitting: Cardiovascular Disease

## 2022-02-05 MED ORDER — ATORVASTATIN CALCIUM 40 MG PO TABS: ORAL_TABLET | ORAL | 3 refills | Status: DC

## 2022-02-08 DIAGNOSIS — M25511 Pain in right shoulder: Secondary | ICD-10-CM | POA: Diagnosis not present

## 2022-02-11 DIAGNOSIS — M5459 Other low back pain: Secondary | ICD-10-CM | POA: Diagnosis not present

## 2022-02-11 DIAGNOSIS — M545 Low back pain, unspecified: Secondary | ICD-10-CM | POA: Diagnosis not present

## 2022-02-14 ENCOUNTER — Telehealth: Payer: Self-pay | Admitting: *Deleted

## 2022-02-14 ENCOUNTER — Encounter: Payer: Self-pay | Admitting: Family Medicine

## 2022-02-14 ENCOUNTER — Encounter: Payer: Self-pay | Admitting: Cardiovascular Disease

## 2022-02-14 NOTE — Telephone Encounter (Signed)
   Pre-operative Risk Assessment    Patient Name: Dakota Gibbs  DOB: [birth year] MRN: [id number]      Request for Surgical Clearance    Procedure:   RIGHT REVERSE TSA  Date of Surgery:  Clearance TBD                                 Surgeon:  DR. Esmond Plants Surgeon's Group or Practice Name:  [facility] Phone number:  [phone] Fax number:  [phone] ATTN: MEGAN DAVIS   Type of Clearance Requested:   - Medical ; ASA    Type of Anesthesia:   CHOICE   Additional requests/questions:    Jiles Prows   02/14/2022, 5:56 PM

## 2022-02-16 ENCOUNTER — Other Ambulatory Visit: Payer: Self-pay | Admitting: *Deleted

## 2022-02-16 DIAGNOSIS — M79604 Pain in right leg: Secondary | ICD-10-CM

## 2022-02-17 ENCOUNTER — Ambulatory Visit (HOSPITAL_COMMUNITY)
Admission: RE | Admit: 2022-02-17 | Discharge: 2022-02-17 | Disposition: A | Discharge status: Home / Self Care | Payer: Medicare Other | Source: Ambulatory Visit | Attending: Surgery | Admitting: Surgery

## 2022-02-17 ENCOUNTER — Ambulatory Visit (INDEPENDENT_AMBULATORY_CARE_PROVIDER_SITE_OTHER): Payer: Medicare Other | Admitting: Family Medicine

## 2022-02-17 ENCOUNTER — Encounter: Payer: Self-pay | Admitting: Family Medicine

## 2022-02-17 VITALS — BP 130/68 | HR 51 | Temp 97.0°F | Ht 70.0 in | Wt 208.4 lb

## 2022-02-17 DIAGNOSIS — S4991XD Unspecified injury of right shoulder and upper arm, subsequent encounter: Secondary | ICD-10-CM

## 2022-02-17 DIAGNOSIS — G629 Polyneuropathy, unspecified: Secondary | ICD-10-CM | POA: Insufficient documentation

## 2022-02-17 DIAGNOSIS — S4991XA Unspecified injury of right shoulder and upper arm, initial encounter: Secondary | ICD-10-CM | POA: Insufficient documentation

## 2022-02-17 DIAGNOSIS — G63 Polyneuropathy in diseases classified elsewhere: Secondary | ICD-10-CM

## 2022-02-17 DIAGNOSIS — Z23 Encounter for immunization: Secondary | ICD-10-CM

## 2022-02-17 DIAGNOSIS — M48062 Spinal stenosis, lumbar region with neurogenic claudication: Secondary | ICD-10-CM

## 2022-02-17 DIAGNOSIS — M79605 Pain in left leg: Secondary | ICD-10-CM | POA: Insufficient documentation

## 2022-02-17 DIAGNOSIS — M79604 Pain in right leg: Secondary | ICD-10-CM

## 2022-02-17 DIAGNOSIS — E349 Endocrine disorder, unspecified: Secondary | ICD-10-CM | POA: Diagnosis not present

## 2022-02-17 NOTE — Telephone Encounter (Signed)
Patient wanting to know if   clearance has been completed. He stating he is  continuous pain and would like to have surgery as soon as possible. Dr Veverly Fells office will not schedule until they receive authorization  clearance.

## 2022-02-17 NOTE — Progress Notes (Signed)
Established Patient Office Visit  Subjective   Patient ID: Dakota Gibbs, male    DOB: [birth year]  Age: 71 y.o. MRN: [id number]  Chief Complaint  Patient presents with   Medical Clearance    Needing clearance for upcoming right shoulder surgery. No concerns.     HPI on the 30th of last month patient fell and sustained a Felsenthal injury to his right shoulder.  Reverse shoulder arthroplasty has been recommended.  He is seeking surgical clearance.  Of significance he has developed paresthesias in both of his legs after walking for long.'s are standing.  History of diabetic neuropathy.  Recent B12 and TSH levels were normal.  Preliminary report on recent ABIs are normal.  History of severe degenerative disc disease in his lower back.  He tripped above because he could not feel his feet in a contour of the walkway change.    Review of Systems  Constitutional: Negative.   HENT: Negative.    Eyes:  Negative for blurred vision, discharge and redness.  Respiratory: Negative.    Cardiovascular: Negative.   Gastrointestinal:  Negative for abdominal pain.  Genitourinary: Negative.   Musculoskeletal:  Positive for back pain and joint pain. Negative for myalgias.  Skin:  Negative for rash.  Neurological:  Positive for tingling. Negative for loss of consciousness and weakness.  Endo/Heme/Allergies:  Negative for polydipsia.      Objective:     BP 130/68 (BP Location: Left Arm, Patient Position: Sitting, Cuff Size: Normal)   Pulse (!) 51   Temp (!) 97 F (36.1 C) (Temporal)   Ht '5\' 10"'$  (1.778 m)   Wt 208 lb 6.4 oz (94.5 kg)   SpO2 94%   BMI 29.90 kg/m  Wt Readings from Last 3 Encounters:  02/17/22 208 lb 6.4 oz (94.5 kg)  12/03/21 193 lb (87.5 kg)  10/01/21 197 lb 6.4 oz (89.5 kg)      Physical Exam Constitutional:      General: He is not in acute distress.    Appearance: Normal appearance. He is not ill-appearing, toxic-appearing or diaphoretic.  HENT:     Head:  Normocephalic and atraumatic.     Right Ear: External ear normal.     Left Ear: External ear normal.  Eyes:     General: No scleral icterus.       Right eye: No discharge.        Left eye: No discharge.     Extraocular Movements: Extraocular movements intact.     Conjunctiva/sclera: Conjunctivae normal.  Cardiovascular:     Rate and Rhythm: Normal rate and regular rhythm.  Pulmonary:     Effort: Pulmonary effort is normal. No respiratory distress.     Breath sounds: Normal breath sounds.  Abdominal:     General: Bowel sounds are normal.  Skin:    General: Skin is warm and dry.  Neurological:     Mental Status: He is alert and oriented to person, place, and time.  Psychiatric:        Mood and Affect: Mood normal.        Behavior: Behavior normal.      No results found for any visits on 02/17/22.    The 10-year ASCVD risk score (Arnett DK, et al., 2019) is: 36.6%    Assessment & Plan:   Problem List Items Addressed This Visit       Nervous and Auditory   Neuropathy associated with endocrine disorder ([place])   Relevant Medications  gabapentin (NEURONTIN) 300 MG capsule     Other   Injury of right shoulder - Primary   Need for influenza vaccination   Relevant Orders   Flu vaccine HIGH DOSE PF (Fluzone High dose)   Other Visit Diagnoses     Spinal stenosis of lumbar region with neurogenic claudication       Relevant Medications   gabapentin (NEURONTIN) 300 MG capsule       Return Follow-up as previously scheduled from August appointment..   Surgical clearance given.  He also has follow-up with cardiology for surgical clearance. Libby Maw, MD

## 2022-02-17 NOTE — Telephone Encounter (Signed)
   Name: ALECXIS BALTZELL  DOB: 15-Feb-1951  MRN: [id number]  Primary Cardiologist: Shelva Majestic, MD  Chart reviewed as part of pre-operative protocol coverage. Because of Cuauhtemoc Huegel Demars's past medical history and time since last visit, he will require a follow-up in-office visit in order to better assess preoperative cardiovascular risk.  Pre-op covering staff: - Please schedule appointment and call patient to inform them. If patient already had an upcoming appointment within acceptable timeframe, please add "pre-op clearance" to the appointment notes so provider is aware. - Please contact requesting surgeon's office via preferred method (i.e, phone, fax) to inform them of need for appointment prior to surgery.   [name], NP  02/17/2022, 3:40 PM

## 2022-02-17 NOTE — Telephone Encounter (Signed)
Left message to  call back to schedule an IN OFFICE appt for pre op clearance.

## 2022-02-18 ENCOUNTER — Encounter: Payer: Self-pay | Admitting: Vascular Surgery

## 2022-02-18 ENCOUNTER — Ambulatory Visit: Payer: Medicare Other | Admitting: Vascular Surgery

## 2022-02-18 DIAGNOSIS — M79605 Pain in left leg: Secondary | ICD-10-CM | POA: Diagnosis not present

## 2022-02-18 DIAGNOSIS — M79604 Pain in right leg: Secondary | ICD-10-CM | POA: Diagnosis not present

## 2022-02-18 DIAGNOSIS — M79606 Pain in leg, unspecified: Secondary | ICD-10-CM | POA: Insufficient documentation

## 2022-02-18 NOTE — Telephone Encounter (Signed)
S/w the pt and he is agreeable to plan of care for in office appt for pre op clearance. Pt has been scheduled with Fabian Sharp, [facility] 02/25/22 @ 1:55 pm.

## 2022-02-18 NOTE — Progress Notes (Signed)
Patient name: Dakota Gibbs MRN: [id number] DOB: 1951/01/07 Sex: male  REASON FOR CONSULT: Evaluate for PVD  HPI: Dakota Gibbs is a 71 y.o. male, with history of diabetes, hypertension, hyperlipidemia, coronary artery disease status post MI that presents for evaluation of PVD.  Patient describes over the last year he gets numbness in both legs when standing but gets better when he sits.  When he walks about 15 minutes he also gets numbness in both legs.  He has been evaluated by Dr. Rolena Infante who has since referred him to our practice.  Dr. Charmayne Sheer notes suggest the patient has some symptoms consistent with lumbar spinal stenosis with neurogenic claudication and is getting an MRI of the spine that is pending.  He has recommended referral here given significant calcification noted on a lumbar spine x-ray of his blood vessels - I do not have these images.  Past Medical History:  Diagnosis Date   Allergy    enviornmental   Anxiety    Anxiety and depression 10/25/2012   Arthritis    self dx   Back pain    L4-L5 bulging disc, L5-S1 - bulging disc, pinched nerve in neck   CAD (coronary artery disease)      Dr Claiborne Billings, s/p stents----- sees cards yearly    Cancer [facility])    basal cell; carcinoma removed x3 and SCC   Cataract    bilateral sx   Depression    Diabetes mellitus    pt denies   GERD (gastroesophageal reflux disease)    on meds   History of shingles 2009   Hyperlipidemia    on meds   Hypertension    on meds   Myocardial infarction ([place]) 1997   caused by a blood clot   Sleep apnea    not have a CPAP as of 02/27/2020-just dx    Past Surgical History:  Procedure Laterality Date   ANGIOPLASTY  1997   basel cell     skin carcinoma removed x 4   CARDIAC CATHETERIZATION     10/04/1999   COLONOSCOPY  2018   SA-MAC-suprep(good)-TA   DOPPLER ECHOCARDIOGRAPHY  07/09/2006   EF 50 to 55 %, LA mildy dilated   HERNIA REPAIR  1951   RIGHT   KNEE ARTHROSCOPY Right 2001   R    NM MYOCAR PERF WALL MOTION  08/22/2011   Mets 13,low risk study   POLYPECTOMY  2018   TA   WRIST SURGERY  1984   right    Family History  Problem Relation Age of Onset   Stroke Mother        TIA   COPD Mother    Hypertension Sister    Prostate cancer Father 37   Coronary artery disease Father        GM   Diabetes Brother    Prostate cancer Brother 75   Diabetes Maternal Grandmother    Colon cancer Neg Hx    Esophageal cancer Neg Hx    Colon polyps Neg Hx    Rectal cancer Neg Hx    Stomach cancer Neg Hx     SOCIAL HISTORY: Social History   Socioeconomic History   Marital status: Married    Spouse name: Not on file   Number of children: 2   Years of education: Not on file   Highest education level: Not on file  Occupational History   Occupation: executive recruter   Tobacco Use   Smoking status: Former  Types: Cigars    Quit date: 09/2020    Years since quitting: 1.4   Smokeless tobacco: Never  Vaping Use   Vaping Use: Never used  Substance and Sexual Activity   Alcohol use: Yes    Alcohol/week: 21.0 standard drinks of alcohol    Types: 21 Glasses of wine per week    Comment:  3 glasses wine / night   Drug use: No   Sexual activity: Yes  Other Topics Concern   Not on file  Social History Narrative   Lives w/ wife      Right Handed    Lives in a two story home   Drinks two cups of coffee daily      Social Determinants of Health   Financial Resource Strain: Not on file  Food Insecurity: Not on file  Transportation Needs: Not on file  Physical Activity: Not on file  Stress: Not on file  Social Connections: Not on file  Intimate Partner Violence: Not on file    No Known Allergies  Current Outpatient Medications  Medication Sig Dispense Refill   amLODipine (NORVASC) 5 MG tablet TAKE ONE TABLET BY MOUTH DAILY 90 tablet 2   aspirin EC 81 MG tablet Take 1 tablet (81 mg total) by mouth daily. 90 tablet 3   atorvastatin (LIPITOR) 40 MG tablet  TAKE 1 TABLET EVERY DAY AT 6 PM 90 tablet 3   cetirizine (ZYRTEC) 10 MG tablet Take 10 mg by mouth daily.     citalopram (CELEXA) 20 MG tablet Take 1 and 1/2 tablets daily 135 tablet 1   ezetimibe (ZETIA) 10 MG tablet TAKE 1 TABLET EVERY DAY 90 tablet 3   FLUTICASONE PROPIONATE, NASAL, NA in the morning.     gabapentin (NEURONTIN) 300 MG capsule Take 300 mg by mouth 3 (three) times daily.     hydrochlorothiazide (MICROZIDE) 12.5 MG capsule TAKE 2 CAPSULES EVERY OTHER DAY ALTERNATING WITH TAKE 1 CAPSULE EVERY OTHER DAY AS DIRECTED 135 capsule 3   metFORMIN (GLUCOPHAGE-XR) 500 MG 24 hr tablet TAKE ONE TABLET BY MOUTH EVERY DAY WITH BREAKFAST 90 tablet 1   metoprolol tartrate (LOPRESSOR) 25 MG tablet TAKE 1/2 TO 1 TABLET EVERY DAY  AS NEEDED (HEART PALPITATIONS). 90 tablet 3   Multiple Vitamin (MULTIVITAMIN) tablet Take 1 tablet by mouth daily.     nitroGLYCERIN (NITROSTAT) 0.4 MG SL tablet Place 1 tablet (0.4 mg total) under the tongue every 5 (five) minutes as needed for chest pain. 25 tablet 1   olmesartan (BENICAR) 20 MG tablet TAKE ONE TABLET BY MOUTH DAILY 90 tablet 1   oxymetazoline (AFRIN) 0.05 % nasal spray Place 1 spray into both nostrils daily.     tadalafil (CIALIS) 20 MG tablet May take one or two up to every other day as needed for ed. 10 tablet 3   vitamin C (ASCORBIC ACID) 500 MG tablet Take 500 mg by mouth daily.     vitamin E 400 UNIT capsule Take 400 Units by mouth daily.     No current facility-administered medications for this visit.    REVIEW OF SYSTEMS:  _0  denotes positive finding, _1  denotes negative finding Cardiac  Comments:  Chest pain or chest pressure:    Shortness of breath upon exertion:    Short of breath when lying flat:    Irregular heart rhythm:        Vascular    Pain in calf, thigh, or hip brought on by ambulation: x  Pain in feet at night that wakes you up from your sleep:     Blood clot in your veins:    Leg swelling:         Pulmonary     Oxygen at home:    Productive cough:     Wheezing:         Neurologic    Sudden weakness in arms or legs:     Sudden numbness in arms or legs:     Sudden onset of difficulty speaking or slurred speech:    Temporary loss of vision in one eye:     Problems with dizziness:         Gastrointestinal    Blood in stool:     Vomited blood:         Genitourinary    Burning when urinating:     Blood in urine:        Psychiatric    Major depression:         Hematologic    Bleeding problems:    Problems with blood clotting too easily:        Skin    Rashes or ulcers:        Constitutional    Fever or chills:      PHYSICAL EXAM: Vitals:   02/18/22 0923  BP: (!) 144/72  Pulse: (!) 56  Resp: 16  Temp: 97.6 F (36.4 C)  TempSrc: Temporal  SpO2: 95%  Weight: 205 lb (93 kg)  Height: _0  (1.778 m)    GENERAL: The patient is a well-nourished male, in no acute distress. The vital signs are documented above. CARDIAC: There is a regular rate and rhythm.  VASCULAR:  Palpable femoral pulses bilaterally Palpable DP PT pulses bilaterally No lower extremity tissue loss PULMONARY: No respiratory distress. ABDOMEN: Soft and non-tender. MUSCULOSKELETAL: There are no major deformities or cyanosis. NEUROLOGIC: No focal weakness or paresthesias are detected. SKIN: There are no ulcers or rashes noted. PSYCHIATRIC: The patient has a normal affect.  DATA:   ABI's yesterday  ABI Findings:  +---------+------------------+-----+-----------+--------+  Right    Rt Pressure (mmHg)IndexWaveform   Comment   +---------+------------------+-----+-----------+--------+  Brachial 148                                         +---------+------------------+-----+-----------+--------+  PTA      198               1.34 multiphasic          +---------+------------------+-----+-----------+--------+  DP       189               1.28 triphasic             +---------+------------------+-----+-----------+--------+  Great Toe130               0.88 Normal               +---------+------------------+-----+-----------+--------+   +---------+------------------+-----+---------+-------+  Left     Lt Pressure (mmHg)IndexWaveform Comment  +---------+------------------+-----+---------+-------+  Brachial 138                                      +---------+------------------+-----+---------+-------+  PTA      182  1.23 triphasic         +---------+------------------+-----+---------+-------+  DP       183               1.24 triphasic         +---------+------------------+-----+---------+-------+  Great Toe124               0.84 Normal            +---------+------------------+-----+---------+-------+   +-------+-----------+-----------+------------+------------+  ABI/TBIToday's ABIToday's TBIPrevious ABIPrevious TBI  +-------+-----------+-----------+------------+------------+  Right  1.34       0.88                                 +-------+-----------+-----------+------------+------------+  Left   1.24       0.84                                 +-------+-----------+-----------+------------+------------+   Assessment/Plan:  71 year old male with multiple risk factors that presents for evaluation of PVD after calcification and atherosclerosis was noted on a spinal x-ray of his lumbar spine.  Discussed that I suspect his symptoms are consistent with neurogenic claudication from his likely lumbar spinal stenosis.  He is already being followed by Dr. Rolena Infante and has an MRI pending.  On exam he has easily palpable pedal pulses and has normal ABIs with normal triphasic waveforms at the ankle.  No signs of significant arterial insufficiency to explain his symptoms.  I discussed aspirin, statin, no smoking and exercise for risk reduction.  Happy to see him in the future if questions or concerns  arise.   Marty Heck, MD [facility] Office: [phone]

## 2022-02-24 NOTE — Progress Notes (Signed)
Cardiology Office Note:    Date:  02/25/2022   ID:  Dakota Gibbs, DOB 1951-02-04, MRN [id number]  PCP:  Libby Maw, [place] Providers Cardiologist:  Shelva Majestic, MD Cardiology APP:  Ledora Bottcher, [place]     Referring MD: Libby Maw,*   Chief Complaint  Patient presents with   Pre-op Exam  CAD  History of Present Illness:    Dakota Gibbs is a 71 y.o. male with a hx of CAD s/p inferior MI 05/10/1995 with PCI to a totally occluded RCA.  [name] disease treated medically.  He had staged intervention to his LCx marginal vessel.  Cardiac catheterization 2001 did not show any restenosis and showed coronary artery disease regression.  He has hypertension, hyperlipidemia, GERD.  Nuclear stress test 01/27/2019 showed no ischemia.  Echocardiogram the following year showed Dakota Gibbs 55-60%, grade 1 DD, normal RV, mild biatrial enlargement, and dilatation of the aortic root to 40 mm.  OSA is treated with CPAP.  He was last seen by Dr. Claiborne Billings 11/20/2020 and was doing well at that time.  He presents today for preoperative risk evaluation for upcoming right shoulder surgery.  He has been having lower back pain in which his legs go numb.  This caused a mechanical fall and he tried to catch himself with his right arm causing a rotator cuff tear.  He denies chest pain, palpitations, shortness of breath, orthopnea, lower extremity swelling, and syncope.  From a cardiovascular standpoint, he is doing quite well.    Past Medical History:  Diagnosis Date   Allergy    enviornmental   Anxiety    Anxiety and depression 10/25/2012   Arthritis    self dx   Back pain    L4-L5 bulging disc, L5-S1 - bulging disc, pinched nerve in neck   CAD (coronary artery disease)      Dr Claiborne Billings, s/p stents----- sees cards yearly    Cancer [facility])    basal cell; carcinoma removed x3 and SCC   Cataract    bilateral sx   Depression    Diabetes mellitus    pt denies   GERD  (gastroesophageal reflux disease)    on meds   History of shingles 2009   Hyperlipidemia    on meds   Hypertension    on meds   Myocardial infarction ([place]) 1997   caused by a blood clot   Sleep apnea    not have a CPAP as of 02/27/2020-just dx    Past Surgical History:  Procedure Laterality Date   ANGIOPLASTY  1997   basel cell     skin carcinoma removed x 4   CARDIAC CATHETERIZATION     10/04/1999   COLONOSCOPY  2018   SA-MAC-suprep(good)-TA   DOPPLER ECHOCARDIOGRAPHY  07/09/2006   EF 50 to 55 %, LA mildy dilated   HERNIA REPAIR  1951   RIGHT   KNEE ARTHROSCOPY Right 2001   R   NM MYOCAR PERF WALL MOTION  08/22/2011   Mets 13,low risk study   POLYPECTOMY  2018   TA   WRIST SURGERY  1984   right    Current Medications: Current Meds  Medication Sig   amLODipine (NORVASC) 5 MG tablet TAKE ONE TABLET BY MOUTH DAILY   aspirin EC 81 MG tablet Take 1 tablet (81 mg total) by mouth daily.   atorvastatin (LIPITOR) 40 MG tablet TAKE 1 TABLET EVERY DAY AT 6 PM   cetirizine (  ZYRTEC) 10 MG tablet Take 10 mg by mouth daily.   citalopram (CELEXA) 20 MG tablet Take 1 and 1/2 tablets daily   ezetimibe (ZETIA) 10 MG tablet TAKE 1 TABLET EVERY DAY   FLUTICASONE PROPIONATE, NASAL, NA in the morning.   gabapentin (NEURONTIN) 300 MG capsule Take 300 mg by mouth 3 (three) times daily.   hydrochlorothiazide (MICROZIDE) 12.5 MG capsule TAKE 2 CAPSULES EVERY OTHER DAY ALTERNATING WITH TAKE 1 CAPSULE EVERY OTHER DAY AS DIRECTED   metFORMIN (GLUCOPHAGE-XR) 500 MG 24 hr tablet TAKE ONE TABLET BY MOUTH EVERY DAY WITH BREAKFAST   metoprolol tartrate (LOPRESSOR) 25 MG tablet TAKE 1/2 TO 1 TABLET EVERY DAY  AS NEEDED (HEART PALPITATIONS).   Multiple Vitamin (MULTIVITAMIN) tablet Take 1 tablet by mouth daily.   nitroGLYCERIN (NITROSTAT) 0.4 MG SL tablet Place 1 tablet (0.4 mg total) under the tongue every 5 (five) minutes as needed for chest pain.   olmesartan (BENICAR) 20 MG tablet TAKE ONE TABLET BY  MOUTH DAILY   oxymetazoline (AFRIN) 0.05 % nasal spray Place 1 spray into both nostrils daily.   tadalafil (CIALIS) 20 MG tablet May take one or two up to every other day as needed for ed.   vitamin C (ASCORBIC ACID) 500 MG tablet Take 500 mg by mouth daily.   vitamin E 400 UNIT capsule Take 400 Units by mouth daily.     Allergies:   Patient has no known allergies.   Social History   Socioeconomic History   Marital status: Married    Spouse name: Not on file   Number of children: 2   Years of education: Not on file   Highest education level: Not on file  Occupational History   Occupation: executive recruter   Tobacco Use   Smoking status: Former    Types: Cigars    Quit date: 09/2020    Years since quitting: 1.4   Smokeless tobacco: Never  Vaping Use   Vaping Use: Never used  Substance and Sexual Activity   Alcohol use: Yes    Alcohol/week: 21.0 standard drinks of alcohol    Types: 21 Glasses of wine per week    Comment:  3 glasses wine / night   Drug use: No   Sexual activity: Yes  Other Topics Concern   Not on file  Social History Narrative   Lives w/ wife      Right Handed    Lives in a two story home   Drinks two cups of coffee daily      Social Determinants of Health   Financial Resource Strain: Not on file  Food Insecurity: Not on file  Transportation Needs: Not on file  Physical Activity: Not on file  Stress: Not on file  Social Connections: Not on file     Family History: The patient's family history includes COPD in his mother; Coronary artery disease in his father; Diabetes in his brother and maternal grandmother; Hypertension in his sister; Prostate cancer (age of onset: 45) in his brother; Prostate cancer (age of onset: 22) in his father; Stroke in his mother. There is no history of Colon cancer, Esophageal cancer, Colon polyps, Rectal cancer, or Stomach cancer.  ROS:   Please see the history of present illness.     All other systems reviewed and  are negative.  EKGs/Labs/Other Studies Reviewed:    The following studies were reviewed today:  Echo 01/10/20:  1. Left ventricular ejection fraction, by estimation, is 55 to 60%. The  left ventricle has normal function. The left ventricle has no regional  wall motion abnormalities. Left ventricular diastolic parameters are  consistent with Grade I diastolic  dysfunction (impaired relaxation).   2. Right ventricular systolic function is normal. The right ventricular  size is normal. There is normal pulmonary artery systolic pressure. The  estimated right ventricular systolic pressure is 74.1 mmHg.   3. Left atrial size was mildly dilated.   4. Right atrial size was mildly dilated.   5. The mitral valve is normal in structure. No evidence of mitral valve  regurgitation. No evidence of mitral stenosis.   6. The aortic valve is tricuspid. Aortic valve regurgitation is trivial.  No aortic stenosis is present.   7. Aortic dilatation noted. There is mild dilatation of the aortic root  measuring 40 mm.   8. The inferior vena cava is normal in size with greater than 50%  respiratory variability, suggesting right atrial pressure of 3 mmHg.   EKG:  EKG is  ordered today.  The ekg ordered today demonstrates sinus bradycardia HR 47 with baseline artifact  Recent Labs: 06/11/2021: TSH 2.430 12/03/2021: ALT 14; BUN 15; Creatinine, Ser 0.91; Hemoglobin 13.6; Platelets 232.0; Potassium 4.6; Sodium 135  Recent Lipid Panel    Component Value Date/Time   CHOL 137 05/30/2021 1017   CHOL 157 04/25/2020 0854   TRIG 152.0 (H) 05/30/2021 1017   HDL 60.30 05/30/2021 1017   HDL 77 04/25/2020 0854   CHOLHDL 2 05/30/2021 1017   VLDL 30.4 05/30/2021 1017   LDLCALC 47 05/30/2021 1017   LDLCALC 54 04/25/2020 0854   LDLDIRECT 66.0 07/22/2019 1014     Risk Assessment/Calculations:                Physical Exam:    VS:  BP (!) 124/52 (BP Location: Left Arm, Patient Position: Sitting)   Pulse (!) 47    Ht _0  (1.778 m)   Wt 207 lb 9.6 oz (94.2 kg)   SpO2 96%   BMI 29.79 kg/m     Wt Readings from Last 3 Encounters:  02/25/22 207 lb 9.6 oz (94.2 kg)  02/18/22 205 lb (93 kg)  02/17/22 208 lb 6.4 oz (94.5 kg)     GEN:  Well nourished, well developed in no acute distress HEENT: Normal NECK: No JVD; No carotid bruits LYMPHATICS: No lymphadenopathy CARDIAC: RRR, no murmurs, rubs, gallops RESPIRATORY:  Clear to auscultation without rales, wheezing or rhonchi  ABDOMEN: Soft, non-tender, non-distended MUSCULOSKELETAL:  No edema; No deformity  SKIN: Warm and dry NEUROLOGIC:  Alert and oriented x 3 PSYCHIATRIC:  Normal affect   ASSESSMENT:    1. CAD in native artery   2. Essential hypertension   3. Grade I diastolic dysfunction   4. Hyperlipidemia with target LDL less than 70   5. Preoperative cardiovascular examination    PLAN:    In order of problems listed above:  CAD Prior PCI to RCA and LCx marginal vessel 1997.  Reassuring nuclear stress test 2020 Reassuring echocardiogram 2021 Maintained on aspirin, Lipitor 40 mg, Zetia 10 mg, amlodipine, HCTZ, Lopressor, and olmesartan   Hypertension Medications as above   Hyperlipidemia with LDL goal less than 70 40 mg Lipitor, 10 mg Zetia 05/30/2021: Cholesterol 137; HDL 60.30; LDL Cholesterol 47; Triglycerides 152.0; VLDL 30.4   Preoperative risk evaluation for Mace Patient undergoing right shoulder surgery with Dr. Veverly Fells He has a history of ischemic heart disease, but no CHF and has not maintained on insulin.  He has a normal renal function.  History of stroke. According to the RCRI he has a 0.9% risk of MACE.  We discussed his risk for surgery and he is agreeable to proceed.  No plans for further testing.  We prefer to continue aspirin throughout the perioperative period.  However, if doing so significantly increases morbidity or mortality may hold aspirin 5 to 7 days prior to surgery and resume as soon after when  safe.  Therefore, based on ACC/AHA guidelines, the patient would be at acceptable risk for the planned procedure without further cardiovascular testing.   The patient was advised that if he develops new symptoms prior to surgery to contact our office to arrange for a follow-up visit, and he verbalized understanding.   Follow up with Dr. Claiborne Billings in 1 year.       Medication Adjustments/Labs and Tests Ordered: Current medicines are reviewed at length with the patient today.  Concerns regarding medicines are outlined above.  Orders Placed This Encounter  Procedures   EKG 12-Lead   No orders of the defined types were placed in this encounter.   Patient Instructions  Medication Instructions:  No changes *If you need a refill on your cardiac medications before your next appointment, please call your pharmacy*   Lab Work: No Labs If you have labs (blood work) drawn today and your tests are completely normal, you will receive your results only by: [place] (if you have MyChart) OR A paper copy in the mail If you have any lab test that is abnormal or we need to change your treatment, we will call you to review the results.   Testing/Procedures: No Testing   Follow-Up: At [facility], you and your health needs are our priority.  As part of our continuing mission to provide you with exceptional heart care, we have created designated Provider Care Teams.  These Care Teams include your primary Cardiologist (physician) and Advanced Practice Providers (APPs -  Physician Assistants and Nurse Practitioners) who all work together to provide you with the care you need, when you need it.  We recommend signing up for the patient portal called "MyChart".  Sign up information is provided on this After Visit Summary.  MyChart is used to connect with patients for Virtual Visits (Telemedicine).  Patients are able to view lab/test results, encounter notes, upcoming appointments, etc.   Non-urgent messages can be sent to your provider as well.   To learn more about what you can do with MyChart, go to [email or web address].    Your next appointment:   1 year(s)  The format for your next appointment:   In Person  Provider:   Shelva Majestic, MD       Signed, [place], [place]  02/25/2022 2:27 PM    [place]

## 2022-02-25 ENCOUNTER — Ambulatory Visit: Payer: Medicare Other | Attending: Physician Assistant | Admitting: Physician Assistant

## 2022-02-25 ENCOUNTER — Encounter: Payer: Self-pay | Admitting: Physician Assistant

## 2022-02-25 VITALS — BP 124/52 | HR 47 | Ht 70.0 in | Wt 207.6 lb

## 2022-02-25 DIAGNOSIS — E785 Hyperlipidemia, unspecified: Secondary | ICD-10-CM | POA: Diagnosis not present

## 2022-02-25 DIAGNOSIS — I2510 Atherosclerotic heart disease of native coronary artery without angina pectoris: Secondary | ICD-10-CM | POA: Diagnosis not present

## 2022-02-25 DIAGNOSIS — Z01810 Encounter for preprocedural cardiovascular examination: Secondary | ICD-10-CM

## 2022-02-25 DIAGNOSIS — I5189 Other ill-defined heart diseases: Secondary | ICD-10-CM

## 2022-02-25 DIAGNOSIS — I10 Essential (primary) hypertension: Secondary | ICD-10-CM

## 2022-02-25 NOTE — Patient Instructions (Signed)
Medication Instructions:  No changes *If you need a refill on your cardiac medications before your next appointment, please call your pharmacy*   Lab Work: No Labs If you have labs (blood work) drawn today and your tests are completely normal, you will receive your results only by: [place] (if you have MyChart) OR A paper copy in the mail If you have any lab test that is abnormal or we need to change your treatment, we will call you to review the results.   Testing/Procedures: No Testing   Follow-Up: At [facility], you and your health needs are our priority.  As part of our continuing mission to provide you with exceptional heart care, we have created designated Provider Care Teams.  These Care Teams include your primary Cardiologist (physician) and Advanced Practice Providers (APPs -  Physician Assistants and Nurse Practitioners) who all work together to provide you with the care you need, when you need it.  We recommend signing up for the patient portal called "MyChart".  Sign up information is provided on this After Visit Summary.  MyChart is used to connect with patients for Virtual Visits (Telemedicine).  Patients are able to view lab/test results, encounter notes, upcoming appointments, etc.  Non-urgent messages can be sent to your provider as well.   To learn more about what you can do with MyChart, go to [email or web address].    Your next appointment:   1 year(s)  The format for your next appointment:   In Person  Provider:   Shelva Majestic, MD

## 2022-02-27 DIAGNOSIS — M545 Low back pain, unspecified: Secondary | ICD-10-CM | POA: Diagnosis not present

## 2022-03-10 ENCOUNTER — Telehealth: Payer: Self-pay

## 2022-03-10 ENCOUNTER — Encounter: Payer: Self-pay | Admitting: Orthopedic Surgery

## 2022-03-10 ENCOUNTER — Other Ambulatory Visit: Payer: Self-pay | Admitting: Orthopedic Surgery

## 2022-03-10 DIAGNOSIS — G8929 Other chronic pain: Secondary | ICD-10-CM

## 2022-03-10 NOTE — Telephone Encounter (Signed)
   Pre-operative Risk Assessment    Patient Name: Dakota Gibbs  DOB: 1950-06-28 MRN: [id number]      Request for Surgical Clearance    Procedure:   RIGHT REVERSE TSA   Date of Surgery:  Clearance TBD                                 Surgeon:  DR Newman Nip Surgeon's Group or Practice Name:  [facility] Phone number:  [phone] Fax number:  [phone]   Type of Clearance Requested:   - [place] 10-14 DAYS PRIOR TO SURGERY   Type of Anesthesia:   CHOICE   Additional requests/questions:  Please fax a copy of CARDIAC CLEARANCE to the surgeon's office.  Signed, Jeanmarie Plant Mya Suell  [place] 03/10/2022, 5:07 PM

## 2022-03-11 ENCOUNTER — Telehealth: Payer: Self-pay

## 2022-03-11 DIAGNOSIS — M25511 Pain in right shoulder: Secondary | ICD-10-CM | POA: Diagnosis not present

## 2022-03-11 DIAGNOSIS — M75101 Unspecified rotator cuff tear or rupture of right shoulder, not specified as traumatic: Secondary | ICD-10-CM | POA: Diagnosis not present

## 2022-03-11 NOTE — Telephone Encounter (Signed)
Phone call to patient to discuss order for intradiscal injection. Reviewed pts allergies and most recent weight. Pt was advised not to take oral antibiotics for this procedure that we would give him IV antibiotics prior. Discharge instructions also reviewed with patient and instructed patient to have a driver the day of the procedure. Pt verbalized understanding.  

## 2022-03-19 ENCOUNTER — Other Ambulatory Visit: Payer: Self-pay | Admitting: Orthopedic Surgery

## 2022-03-19 ENCOUNTER — Ambulatory Visit
Admission: RE | Admit: 2022-03-19 | Discharge: 2022-03-19 | Disposition: A | Discharge status: Home / Self Care | Payer: Medicare Other | Source: Ambulatory Visit | Attending: Orthopedic Surgery | Admitting: Orthopedic Surgery

## 2022-03-19 ENCOUNTER — Ambulatory Visit
Admission: RE | Admit: 2022-03-19 | Discharge: 2022-03-19 | Disposition: A | Discharge status: Home / Self Care | Payer: Self-pay | Source: Ambulatory Visit | Attending: Orthopedic Surgery | Admitting: Orthopedic Surgery

## 2022-03-19 DIAGNOSIS — M549 Dorsalgia, unspecified: Secondary | ICD-10-CM | POA: Diagnosis not present

## 2022-03-19 DIAGNOSIS — M545 Low back pain, unspecified: Secondary | ICD-10-CM

## 2022-03-19 MED ORDER — METHYLPREDNISOLONE ACETATE 40 MG/ML INJ SUSP (RADIOLOG
80.0000 mg | Freq: Once | INTRAMUSCULAR | Status: AC
  Administered 2022-03-19: 80 mg via INTRALESIONAL

## 2022-03-19 MED ORDER — CEFAZOLIN SODIUM-DEXTROSE 2-4 GM/100ML-% IV SOLN
2.0000 g | INTRAVENOUS | Status: AC
  Administered 2022-03-19: 2 g via INTRAVENOUS

## 2022-03-19 MED ORDER — IOPAMIDOL (ISOVUE-M 200) INJECTION 41%
1.0000 mL | Freq: Once | INTRAMUSCULAR | Status: AC
  Administered 2022-03-19: 1 mL

## 2022-03-19 NOTE — Discharge Instructions (Addendum)
Intra-Discal Anesthetic Injection Discharge Instruction Sheet  You may resume a regular diet and any medications that you routinely take (including pain medications).  No driving day of procedure.  Light activity throughout the rest of the day.  Do not do any strenuous work, exercise, bending or lifting.  The day following the procedure, you can resume normal physical activity but you should refrain from exercising or physical therapy for at least three days thereafter.   Please contact our office at [phone] for the following symptoms: Fever greater than 100 degrees. Headaches unresolved with medication after 2-3 days.    YOU MAY RESUME YOUR ASPIRIN ANYTIME AFTER PROCEDURE TODAY  

## 2022-04-02 HISTORY — PX: BACK SURGERY: SHX140

## 2022-04-08 ENCOUNTER — Telehealth: Payer: Self-pay | Admitting: Family Medicine

## 2022-04-08 DIAGNOSIS — M5416 Radiculopathy, lumbar region: Secondary | ICD-10-CM | POA: Diagnosis not present

## 2022-04-08 DIAGNOSIS — M48062 Spinal stenosis, lumbar region with neurogenic claudication: Secondary | ICD-10-CM | POA: Diagnosis not present

## 2022-04-08 NOTE — Telephone Encounter (Signed)
PAPERWORK/FORMS received  Dropped off by: pt Call back #:  [phone]  Individual made aware of 3-5 business day turn around (YES/NO): yes GREEN charge sheet completed and patient made aware of possible charge (YES/NO): yes Placed in provider folder at front desk. ~~~ route to CMA/provider Team  CLINICAL USE BELOW THIS LINE (use X to signify action taken)  ___ Form received and placed in providers office for signature. ___ Form completed and faxed to LOA Dept.  ___ Form completed & LVM to notify patient ready for pick up.  ___ Charge sheet and copy of form in front office folder for office supervisor.

## 2022-04-08 NOTE — Telephone Encounter (Signed)
  _X__ Form received and placed in providers office for signature. ___ Form completed and faxed to LOA Dept.  ___ Form completed & LVM to notify patient ready for pick up.  ___ Charge sheet and copy of form in front office folder for office supervisor.

## 2022-04-09 ENCOUNTER — Encounter: Payer: Self-pay | Admitting: Family Medicine

## 2022-04-09 ENCOUNTER — Other Ambulatory Visit: Payer: Self-pay

## 2022-04-09 ENCOUNTER — Encounter: Payer: Self-pay | Admitting: Cardiovascular Disease

## 2022-04-09 NOTE — Telephone Encounter (Signed)
  _X__ Form received and placed in providers office for signature. __X_ Form completed and faxed to LOA Dept.  ___ Form completed & LVM to notify patient ready for pick up.  __X_ Charge sheet and copy of form in front office folder for office supervisor.

## 2022-04-16 ENCOUNTER — Telehealth: Payer: Self-pay

## 2022-04-16 NOTE — Telephone Encounter (Signed)
   Pre-operative Risk Assessment    Patient Name: Dakota Gibbs  DOB: 1951-04-06 MRN: [id number]      Request for Surgical Clearance    Procedure:   L3-L4 Decompression  Date of Surgery:  Clearance TBD                                 Surgeon:  Melina Schools, MD Surgeon's Group or Practice Name:  [facility] Phone number:  [phone] Fax number:  [phone]   Type of Clearance Requested:   - Medical  - Pharmacy:  Hold Aspirin     Type of Anesthesia:  Not Indicated   Additional requests/questions:   none  Signed, Ena Dawley   04/16/2022, 1:39 PM

## 2022-04-17 NOTE — Telephone Encounter (Signed)
   Primary Cardiologist: Shelva Majestic, MD  Chart reviewed as part of pre-operative protocol coverage. Given past medical history and time since last visit, based on ACC/AHA guidelines, JEANMARC VIERNES would be at acceptable risk for the planned procedure without further cardiovascular testing.   Patient was advised that if he develops new symptoms prior to surgery to contact our office to arrange a follow-up appointment. He verbalized understanding.  He may hold aspirin for 5-7 days prior to procedure and should resume as soon as hemodynamically stable following the procedure.   I will route this recommendation to the requesting party via Epic fax function and remove from pre-op pool.  Please call with questions.  Emmaline Life, NP-C  04/17/2022, 2:32 PM [address], [address] Office [phone] Fax [phone]

## 2022-04-17 NOTE — Telephone Encounter (Signed)
Left message for patient to call back to ensure no changes in cardiac history/symptoms since office visit 02/25/22.

## 2022-04-17 NOTE — Telephone Encounter (Signed)
Pt is returning call.  

## 2022-04-21 ENCOUNTER — Encounter: Payer: Self-pay | Admitting: Cardiovascular Disease

## 2022-04-22 DIAGNOSIS — M25562 Pain in left knee: Secondary | ICD-10-CM | POA: Diagnosis not present

## 2022-04-23 ENCOUNTER — Telehealth: Payer: Self-pay | Admitting: *Deleted

## 2022-04-23 NOTE — Telephone Encounter (Signed)
Message left Dr Claiborne Billings did make a CPAP pressure change. We will do another download in about 4-6 weeks. If he has any questions and or concerns before then, don't hesitate to reach out to [place].

## 2022-04-24 DIAGNOSIS — M5416 Radiculopathy, lumbar region: Secondary | ICD-10-CM | POA: Diagnosis not present

## 2022-04-27 ENCOUNTER — Encounter: Payer: Self-pay | Admitting: Cardiovascular Disease

## 2022-04-27 ENCOUNTER — Other Ambulatory Visit: Payer: Self-pay | Admitting: Cardiovascular Disease

## 2022-04-29 ENCOUNTER — Other Ambulatory Visit: Payer: Self-pay | Admitting: Cardiovascular Disease

## 2022-04-29 MED ORDER — ATORVASTATIN CALCIUM 40 MG PO TABS: ORAL_TABLET | ORAL | 3 refills | Status: DC

## 2022-05-02 ENCOUNTER — Other Ambulatory Visit: Payer: Self-pay

## 2022-05-02 ENCOUNTER — Telehealth: Payer: Self-pay | Admitting: Family Medicine

## 2022-05-02 DIAGNOSIS — E1142 Type 2 diabetes mellitus with diabetic polyneuropathy: Secondary | ICD-10-CM

## 2022-05-02 DIAGNOSIS — M48062 Spinal stenosis, lumbar region with neurogenic claudication: Secondary | ICD-10-CM | POA: Diagnosis not present

## 2022-05-02 DIAGNOSIS — M5116 Intervertebral disc disorders with radiculopathy, lumbar region: Secondary | ICD-10-CM | POA: Diagnosis not present

## 2022-05-02 DIAGNOSIS — M5136 Other intervertebral disc degeneration, lumbar region: Secondary | ICD-10-CM | POA: Diagnosis not present

## 2022-05-02 MED ORDER — METFORMIN HCL ER 500 MG PO TB24: ORAL_TABLET | ORAL | 1 refills | Status: DC

## 2022-05-02 NOTE — Telephone Encounter (Signed)
  LAST APPOINTMENT DATE:  02/17/22  NEXT APPOINTMENT DATE: 07/14/22  MEDICATION: metFORMIN (GLUCOPHAGE-XR) 500 MG 24 hr tablet   Is the patient out of medication? Almost out  [facility] [place], [place], [place] [address] Phone: [phone]  Fax: [phone]

## 2022-05-06 ENCOUNTER — Encounter: Payer: Self-pay | Admitting: Family Medicine

## 2022-05-06 ENCOUNTER — Encounter: Payer: Self-pay | Admitting: Cardiovascular Disease

## 2022-05-06 NOTE — Telephone Encounter (Signed)
Duplicate encounter. Encounter completed.

## 2022-06-02 ENCOUNTER — Encounter: Payer: Self-pay | Admitting: Family Medicine

## 2022-06-02 ENCOUNTER — Ambulatory Visit (INDEPENDENT_AMBULATORY_CARE_PROVIDER_SITE_OTHER): Payer: Medicare Other | Admitting: Family Medicine

## 2022-06-02 VITALS — BP 140/60 | HR 51 | Temp 97.6°F | Ht 70.0 in | Wt 211.6 lb

## 2022-06-02 DIAGNOSIS — R140 Abdominal distension (gaseous): Secondary | ICD-10-CM | POA: Diagnosis not present

## 2022-06-02 DIAGNOSIS — R7303 Prediabetes: Secondary | ICD-10-CM

## 2022-06-02 LAB — MICROALBUMIN / CREATININE URINE RATIO
Creatinine,U: 60 mg/dL
Microalb Creat Ratio: 1.2 mg/g (ref 0.0–30.0)
Microalb, Ur: <0.7 mg/dL (ref 0.0–1.9)

## 2022-06-02 LAB — COMPREHENSIVE METABOLIC PANEL
ALT: 12 U/L (ref 0–53)
AST: 14 U/L (ref 0–37)
Albumin: 4.1 g/dL (ref 3.5–5.2)
Alkaline Phosphatase: 84 U/L (ref 39–117)
BUN: 19 mg/dL (ref 6–23)
CO2: 26 mEq/L (ref 19–32)
Calcium: 9.2 mg/dL (ref 8.4–10.5)
Chloride: 101 mEq/L (ref 96–112)
Creatinine, Ser: 0.84 mg/dL (ref 0.40–1.50)
GFR: 86.48 mL/min (ref >60.00)
Glucose, Bld: 108 mg/dL — ABNORMAL HIGH (ref 70–99)
Potassium: 4.7 mEq/L (ref 3.5–5.1)
Sodium: 139 mEq/L (ref 135–145)
Total Bilirubin: 0.5 mg/dL (ref 0.2–1.2)
Total Protein: 6.9 g/dL (ref 6.0–8.3)

## 2022-06-02 LAB — URINALYSIS, ROUTINE W REFLEX MICROSCOPIC
Bilirubin Urine: NEGATIVE
Hgb urine dipstick: NEGATIVE
Ketones, ur: NEGATIVE
Leukocytes,Ua: NEGATIVE
Nitrite: NEGATIVE
RBC / HPF: NONE SEEN (ref 0–?)
Specific Gravity, Urine: 1.015 (ref 1.000–1.030)
Total Protein, Urine: NEGATIVE
Urine Glucose: NEGATIVE
Urobilinogen, UA: 0.2 (ref 0.0–1.0)
WBC, UA: NONE SEEN (ref 0–?)
pH: 7.5 (ref 5.0–8.0)

## 2022-06-02 LAB — CBC WITH DIFFERENTIAL/PLATELET
Basophils Absolute: 0.1 10*3/uL (ref 0.0–0.1)
Basophils Relative: 0.9 % (ref 0.0–3.0)
Eosinophils Absolute: 0.4 10*3/uL (ref 0.0–0.7)
Eosinophils Relative: 4.3 % (ref 0.0–5.0)
HCT: 39 % (ref 39.0–52.0)
Hemoglobin: 13.2 g/dL (ref 13.0–17.0)
Lymphocytes Relative: 27.7 % (ref 12.0–46.0)
Lymphs Abs: 2.3 10*3/uL (ref 0.7–4.0)
MCHC: 33.8 g/dL (ref 30.0–36.0)
MCV: 97.1 fl (ref 78.0–100.0)
Monocytes Absolute: 0.8 10*3/uL (ref 0.1–1.0)
Monocytes Relative: 9.2 % (ref 3.0–12.0)
Neutro Abs: 4.8 10*3/uL (ref 1.4–7.7)
Neutrophils Relative %: 57.9 % (ref 43.0–77.0)
Platelets: 233 10*3/uL (ref 150.0–400.0)
RBC: 4.01 Mil/uL — ABNORMAL LOW (ref 4.22–5.81)
RDW: 14.1 % (ref 11.5–15.5)
WBC: 8.4 10*3/uL (ref 4.0–10.5)

## 2022-06-02 LAB — AMYLASE: Amylase: 37 U/L (ref 27–131)

## 2022-06-02 LAB — PSA: PSA: 1.2 ng/mL (ref 0.10–4.00)

## 2022-06-02 LAB — HEMOGLOBIN A1C: Hgb A1c MFr Bld: 6.5 % (ref 4.6–6.5)

## 2022-06-02 NOTE — Progress Notes (Signed)
Established Patient Office Visit   Subjective:  Patient ID: Dakota Gibbs, male    DOB: 05/17/1950  Age: 72 y.o. MRN: [id number]  Chief Complaint  Patient presents with   Acute Visit    Abdominal swelling 2 years, weight gain    HPI Encounter Diagnoses  Name Primary?   Abdominal bloating Yes   Prediabetes    Presents with a 44-monthhistory of abdominal bloating.  Status post lumbar microdiscectomy 1 month ago.  He has not taken narcotic pain medicines in the last 3 weeks.  He is taking Neurontin for radiculopathy.  He started on CPAP months ago.  He wondered if that had exacerbated it.  The pressure was reduced and that did not help.  He denies constipation.  He is passing stool is thinner.  It has been difficult for him to pass gas.   Review of Systems  Constitutional: Negative.   HENT: Negative.    Eyes:  Negative for blurred vision, discharge and redness.  Respiratory: Negative.    Cardiovascular: Negative.   Gastrointestinal:  Positive for abdominal pain. Negative for constipation, diarrhea, melena, nausea and vomiting.  Genitourinary: Negative.  Negative for dysuria, frequency, hematuria and urgency.  Musculoskeletal: Negative.  Negative for myalgias.  Skin:  Negative for rash.  Neurological:  Negative for tingling, loss of consciousness and weakness.  Endo/Heme/Allergies:  Negative for polydipsia.     Current Outpatient Medications:    amLODipine (NORVASC) 5 MG tablet, TAKE ONE TABLET BY MOUTH DAILY, Disp: 90 tablet, Rfl: 2   aspirin EC 81 MG tablet, Take 1 tablet (81 mg total) by mouth daily., Disp: 90 tablet, Rfl: 3   atorvastatin (LIPITOR) 40 MG tablet, TAKE 1 TABLET EVERY DAY AT 6 PM, Disp: 90 tablet, Rfl: 3   cetirizine (ZYRTEC) 10 MG tablet, Take 10 mg by mouth daily., Disp: , Rfl:    citalopram (CELEXA) 20 MG tablet, Take 1 and 1/2 tablets daily, Disp: 135 tablet, Rfl: 1   ezetimibe (ZETIA) 10 MG tablet, TAKE ONE TABLET BY MOUTH DAILY, Disp: 90 tablet,  Rfl: 3   FLUTICASONE PROPIONATE, NASAL, NA, in the morning., Disp: , Rfl:    gabapentin (NEURONTIN) 300 MG capsule, Take 300 mg by mouth 3 (three) times daily., Disp: , Rfl:    hydrochlorothiazide (MICROZIDE) 12.5 MG capsule, TAKE 2 CAPSULES EVERY OTHER DAY ALTERNATING WITH TAKE 1 CAPSULE EVERY OTHER DAY AS DIRECTED, Disp: 135 capsule, Rfl: 3   metFORMIN (GLUCOPHAGE-XR) 500 MG 24 hr tablet, TAKE ONE TABLET BY MOUTH EVERY DAY WITH BREAKFAST, Disp: 90 tablet, Rfl: 1   metoprolol tartrate (LOPRESSOR) 25 MG tablet, TAKE 1/2 TO 1 TABLET EVERY DAY  AS NEEDED (HEART PALPITATIONS)., Disp: 90 tablet, Rfl: 3   Multiple Vitamin (MULTIVITAMIN) tablet, Take 1 tablet by mouth daily., Disp: , Rfl:    nitroGLYCERIN (NITROSTAT) 0.4 MG SL tablet, Place 1 tablet (0.4 mg total) under the tongue every 5 (five) minutes as needed for chest pain., Disp: 25 tablet, Rfl: 1   olmesartan (BENICAR) 20 MG tablet, TAKE ONE TABLET BY MOUTH DAILY, Disp: 90 tablet, Rfl: 1   oxymetazoline (AFRIN) 0.05 % nasal spray, Place 1 spray into both nostrils daily., Disp: , Rfl:    tadalafil (CIALIS) 20 MG tablet, May take one or two up to every other day as needed for ed., Disp: 10 tablet, Rfl: 3   vitamin C (ASCORBIC ACID) 500 MG tablet, Take 500 mg by mouth daily., Disp: , Rfl:    vitamin E  400 UNIT capsule, Take 400 Units by mouth daily., Disp: , Rfl:    Objective:     BP (!) 140/60   Pulse (!) 51   Temp 97.6 F (36.4 C) (Tympanic)   Ht '5\' 10"'$  (1.778 m)   Wt 211 lb 9.6 oz (96 kg)   SpO2 96%   BMI 30.36 kg/m  BP Readings from Last 3 Encounters:  06/02/22 (!) 140/60  03/19/22 (!) 177/60  02/25/22 (!) 124/52   Wt Readings from Last 3 Encounters:  06/02/22 211 lb 9.6 oz (96 kg)  02/25/22 207 lb 9.6 oz (94.2 kg)  02/18/22 205 lb (93 kg)      Physical Exam Constitutional:      General: He is not in acute distress.    Appearance: Normal appearance. He is not ill-appearing, toxic-appearing or diaphoretic.  HENT:     Head:  Normocephalic and atraumatic.     Right Ear: External ear normal.     Left Ear: External ear normal.     Mouth/Throat:     Mouth: Mucous membranes are moist.     Pharynx: Oropharynx is clear. No oropharyngeal exudate or posterior oropharyngeal erythema.  Eyes:     General: No scleral icterus.       Right eye: No discharge.        Left eye: No discharge.     Extraocular Movements: Extraocular movements intact.     Conjunctiva/sclera: Conjunctivae normal.     Pupils: Pupils are equal, round, and reactive to light.  Cardiovascular:     Rate and Rhythm: Normal rate and regular rhythm.  Pulmonary:     Effort: Pulmonary effort is normal. No respiratory distress.     Breath sounds: Normal breath sounds.  Abdominal:     General: Bowel sounds are normal. There is distension.     Tenderness: There is no abdominal tenderness. There is no right CVA tenderness, left CVA tenderness, guarding or rebound.  Musculoskeletal:     Cervical back: No rigidity or tenderness.  Skin:    General: Skin is warm and dry.  Neurological:     Mental Status: He is alert and oriented to person, place, and time.  Psychiatric:        Mood and Affect: Mood normal.        Behavior: Behavior normal.      No results found for any visits on 06/02/22.    The 10-year ASCVD risk score (Arnett DK, et al., 2019) is: 40.6%    Assessment & Plan:   Abdominal bloating -     Amylase -     CBC with Differential/Platelet -     Comprehensive metabolic panel -     Urinalysis, Routine w reflex microscopic -     PSA -     CT ABDOMEN PELVIS W CONTRAST; Future -     CT ABDOMEN PELVIS WO CONTRAST; Future  Prediabetes -     Comprehensive metabolic panel -     Hemoglobin A1c -     Microalbumin / creatinine urine ratio    Return in about 1 month (around 07/03/2022), or if symptoms worsen or fail to improve.    Libby Maw, MD

## 2022-06-05 ENCOUNTER — Other Ambulatory Visit: Payer: Self-pay

## 2022-06-05 DIAGNOSIS — I10 Essential (primary) hypertension: Secondary | ICD-10-CM

## 2022-06-05 MED ORDER — OLMESARTAN MEDOXOMIL 20 MG PO TABS: 20.0000 mg | ORAL_TABLET | Freq: Every day | ORAL | 1 refills | Status: DC

## 2022-06-05 NOTE — Telephone Encounter (Signed)
Refill request for pending Rx, last OV 06/02/22 please advise. Last refill 01/01/22

## 2022-06-06 DIAGNOSIS — M25562 Pain in left knee: Secondary | ICD-10-CM | POA: Diagnosis not present

## 2022-06-09 ENCOUNTER — Encounter: Payer: Self-pay | Admitting: Family Medicine

## 2022-06-10 ENCOUNTER — Inpatient Hospital Stay: Admission: RE | Admit: 2022-06-10 | Payer: Medicare Other | Source: Ambulatory Visit

## 2022-06-10 ENCOUNTER — Other Ambulatory Visit: Payer: Medicare Other

## 2022-06-11 NOTE — Telephone Encounter (Signed)
Please advise message below  °

## 2022-06-15 ENCOUNTER — Encounter: Payer: Self-pay | Admitting: Cardiovascular Disease

## 2022-06-16 ENCOUNTER — Telehealth: Payer: Self-pay | Admitting: Family Medicine

## 2022-06-16 MED ORDER — NITROGLYCERIN 0.4 MG SL SUBL: SUBLINGUAL_TABLET | SUBLINGUAL | 6 refills | Status: AC

## 2022-06-16 NOTE — Telephone Encounter (Signed)
Caller Name: Carelon Call back phone #: [phone] Case # [id number]  Reason for Call: call can be peer to peer. Calling about ct of abb and pelvis w/o contrast needs this by 2/16 24

## 2022-06-17 ENCOUNTER — Ambulatory Visit
Admission: RE | Admit: 2022-06-17 | Discharge: 2022-06-17 | Disposition: A | Discharge status: Home / Self Care | Payer: Medicare Other | Source: Ambulatory Visit | Attending: Family Medicine | Admitting: Family Medicine

## 2022-06-17 DIAGNOSIS — R140 Abdominal distension (gaseous): Secondary | ICD-10-CM | POA: Diagnosis not present

## 2022-06-17 DIAGNOSIS — K760 Fatty (change of) liver, not elsewhere classified: Secondary | ICD-10-CM | POA: Diagnosis not present

## 2022-06-17 DIAGNOSIS — I2510 Atherosclerotic heart disease of native coronary artery without angina pectoris: Secondary | ICD-10-CM | POA: Diagnosis not present

## 2022-06-17 DIAGNOSIS — I700 Atherosclerosis of aorta: Secondary | ICD-10-CM | POA: Diagnosis not present

## 2022-06-17 MED ORDER — IOPAMIDOL (ISOVUE-370) INJECTION 76%
80.0000 mL | Freq: Once | INTRAVENOUS | Status: AC | PRN
  Administered 2022-06-17: 80 mL via INTRAVENOUS

## 2022-06-18 NOTE — Telephone Encounter (Signed)
CT approved patient had an appointment on 06/17/22

## 2022-06-19 ENCOUNTER — Encounter: Payer: Self-pay | Admitting: Family Medicine

## 2022-06-20 DIAGNOSIS — M25562 Pain in left knee: Secondary | ICD-10-CM | POA: Diagnosis not present

## 2022-06-23 ENCOUNTER — Encounter: Payer: Self-pay | Admitting: Family Medicine

## 2022-06-23 ENCOUNTER — Ambulatory Visit (INDEPENDENT_AMBULATORY_CARE_PROVIDER_SITE_OTHER): Payer: Medicare Other | Admitting: Family Medicine

## 2022-06-23 VITALS — BP 136/66 | HR 53 | Temp 97.9°F | Ht 70.0 in | Wt 214.0 lb

## 2022-06-23 DIAGNOSIS — K760 Fatty (change of) liver, not elsewhere classified: Secondary | ICD-10-CM | POA: Diagnosis not present

## 2022-06-23 DIAGNOSIS — L720 Epidermal cyst: Secondary | ICD-10-CM | POA: Diagnosis not present

## 2022-06-23 DIAGNOSIS — I517 Cardiomegaly: Secondary | ICD-10-CM | POA: Diagnosis not present

## 2022-06-23 DIAGNOSIS — R140 Abdominal distension (gaseous): Secondary | ICD-10-CM

## 2022-06-23 NOTE — Progress Notes (Signed)
Established Patient Office Visit   Subjective:  Patient ID: Dakota Gibbs, male    DOB: 22-May-1950  Age: 72 y.o. MRN: [id number]  Chief Complaint  Patient presents with   Advice Only    Discuss CT results.     HPI Encounter Diagnoses  Name Primary?   Abdominal bloating Yes   Cardiomegaly    Hepatic steatosis    Inclusion cyst    For follow-up of above.  Enjoys carbonated beverages.  Continues to consume wine.  Echocardiogram in 2021 did not show cardiomegaly.  Blood pressure is well-controlled with amlodipine, olmesartan and HCTZ.  He has been working with pulmonology to decrease the pressure with his CPAP machine.  He does not gulp his food.  Has regular follow-up with cardiology.   Review of Systems  Constitutional: Negative.   HENT: Negative.    Eyes:  Negative for blurred vision, discharge and redness.  Respiratory: Negative.    Cardiovascular: Negative.   Gastrointestinal:  Negative for abdominal pain.  Genitourinary: Negative.   Musculoskeletal: Negative.  Negative for myalgias.  Skin:  Negative for rash.  Neurological:  Negative for tingling, loss of consciousness and weakness.  Endo/Heme/Allergies:  Negative for polydipsia.     Current Outpatient Medications:    amLODipine (NORVASC) 5 MG tablet, TAKE ONE TABLET BY MOUTH DAILY, Disp: 90 tablet, Rfl: 2   aspirin EC 81 MG tablet, Take 1 tablet (81 mg total) by mouth daily., Disp: 90 tablet, Rfl: 3   atorvastatin (LIPITOR) 40 MG tablet, TAKE 1 TABLET EVERY DAY AT 6 PM, Disp: 90 tablet, Rfl: 3   cetirizine (ZYRTEC) 10 MG tablet, Take 10 mg by mouth daily., Disp: , Rfl:    citalopram (CELEXA) 20 MG tablet, Take 1 and 1/2 tablets daily, Disp: 135 tablet, Rfl: 1   ezetimibe (ZETIA) 10 MG tablet, TAKE ONE TABLET BY MOUTH DAILY, Disp: 90 tablet, Rfl: 3   FLUTICASONE PROPIONATE, NASAL, NA, in the morning., Disp: , Rfl:    gabapentin (NEURONTIN) 300 MG capsule, Take 300 mg by mouth 3 (three) times daily., Disp: , Rfl:     hydrochlorothiazide (MICROZIDE) 12.5 MG capsule, TAKE 2 CAPSULES EVERY OTHER DAY ALTERNATING WITH TAKE 1 CAPSULE EVERY OTHER DAY AS DIRECTED, Disp: 135 capsule, Rfl: 3   metFORMIN (GLUCOPHAGE-XR) 500 MG 24 hr tablet, TAKE ONE TABLET BY MOUTH EVERY DAY WITH BREAKFAST, Disp: 90 tablet, Rfl: 1   metoprolol tartrate (LOPRESSOR) 25 MG tablet, TAKE 1/2 TO 1 TABLET EVERY DAY  AS NEEDED (HEART PALPITATIONS)., Disp: 90 tablet, Rfl: 3   Multiple Vitamin (MULTIVITAMIN) tablet, Take 1 tablet by mouth daily., Disp: , Rfl:    nitroGLYCERIN (NITROSTAT) 0.4 MG SL tablet, For chest pain, tightness, or pressure. While sitting, place 1 tablet under tongue. May be used every 5 minutes as needed, for up to 15 minutes. Do not use more than 3 tablets., Disp: 25 tablet, Rfl: 6   olmesartan (BENICAR) 20 MG tablet, Take 1 tablet (20 mg total) by mouth daily., Disp: 90 tablet, Rfl: 1   oxymetazoline (AFRIN) 0.05 % nasal spray, Place 1 spray into both nostrils daily., Disp: , Rfl:    tadalafil (CIALIS) 20 MG tablet, May take one or two up to every other day as needed for ed., Disp: 10 tablet, Rfl: 3   vitamin C (ASCORBIC ACID) 500 MG tablet, Take 500 mg by mouth daily., Disp: , Rfl:    vitamin E 400 UNIT capsule, Take 400 Units by mouth daily., Disp: , Rfl:  Objective:     BP 136/66 (BP Location: Left Arm, Patient Position: Sitting, Cuff Size: Normal)   Pulse (!) 53   Temp 97.9 F (36.6 C) (Temporal)   Ht 5' 10"$  (1.778 m)   Wt 214 lb (97.1 kg)   SpO2 97%   BMI 30.71 kg/m    Physical Exam Constitutional:      General: He is not in acute distress.    Appearance: Normal appearance. He is not ill-appearing, toxic-appearing or diaphoretic.  HENT:     Head: Normocephalic and atraumatic.     Right Ear: External ear normal.     Left Ear: External ear normal.     Mouth/Throat:     Mouth: Mucous membranes are moist.     Pharynx: Oropharynx is clear. No oropharyngeal exudate or posterior oropharyngeal erythema.   Eyes:     General: No scleral icterus.       Right eye: No discharge.        Left eye: No discharge.     Extraocular Movements: Extraocular movements intact.     Conjunctiva/sclera: Conjunctivae normal.     Pupils: Pupils are equal, round, and reactive to light.  Cardiovascular:     Rate and Rhythm: Normal rate and regular rhythm.  Pulmonary:     Effort: Pulmonary effort is normal. No respiratory distress.     Breath sounds: Normal breath sounds.  Musculoskeletal:     Cervical back: No rigidity or tenderness.  Skin:    General: Skin is warm and dry.       Neurological:     Mental Status: He is alert and oriented to person, place, and time.  Psychiatric:        Mood and Affect: Mood normal.        Behavior: Behavior normal.      No results found for any visits on 06/23/22.    The 10-year ASCVD risk score (Arnett DK, et al., 2019) is: 39%    Assessment & Plan:   Abdominal bloating  Cardiomegaly  Hepatic steatosis  Inclusion cyst    Return Limit alcohol intake to no more than 2 servings daily or two 5 ounce servings of wine..  Continue follow-up with pulmonology for adjustment of CPAP.  Limit carbonated beverages.  Limit wine consumption as above.  Information was given on the FODMAP diet.  Will schedule appointment with cardiology for follow-up of cardiomegaly.  Information was given on his hepatic steatosis.  Losing weight would help all 3 of the above.  Will keep an eye on the cyst.  Follow-up with any changes.  Libby Maw, MD

## 2022-06-24 ENCOUNTER — Encounter: Payer: Self-pay | Admitting: Cardiovascular Disease

## 2022-06-25 ENCOUNTER — Other Ambulatory Visit: Payer: Self-pay

## 2022-06-25 ENCOUNTER — Ambulatory Visit: Payer: Medicare Other | Attending: Orthopedic Surgery

## 2022-06-25 DIAGNOSIS — G8929 Other chronic pain: Secondary | ICD-10-CM | POA: Insufficient documentation

## 2022-06-25 DIAGNOSIS — R262 Difficulty in walking, not elsewhere classified: Secondary | ICD-10-CM | POA: Insufficient documentation

## 2022-06-25 DIAGNOSIS — R29898 Other symptoms and signs involving the musculoskeletal system: Secondary | ICD-10-CM | POA: Insufficient documentation

## 2022-06-25 DIAGNOSIS — M25562 Pain in left knee: Secondary | ICD-10-CM | POA: Diagnosis not present

## 2022-06-25 DIAGNOSIS — Z9889 Other specified postprocedural states: Secondary | ICD-10-CM | POA: Diagnosis not present

## 2022-06-25 NOTE — Therapy (Signed)
OUTPATIENT PHYSICAL THERAPY THORACOLUMBAR EVALUATION   Patient Name: Dakota Gibbs MRN: [id number] DOB:Sep 17, 1950, 72 y.o., male Today's Date: 06/25/2022  END OF SESSION:  PT End of Session - 06/25/22 0851     Visit Number 1    Date for PT Re-Evaluation 09/17/22    PT Start Time 0845    PT Stop Time 0930    PT Time Calculation (min) 45 min    Activity Tolerance Patient tolerated treatment well    Behavior During Therapy [facility] for tasks assessed/performed             Past Medical History:  Diagnosis Date   Allergy    enviornmental   Anxiety    Anxiety and depression 10/25/2012   Arthritis    self dx   Back pain    L4-L5 bulging disc, L5-S1 - bulging disc, pinched nerve in neck   CAD (coronary artery disease)      Dr Claiborne Billings, s/p stents----- sees cards yearly    Cancer [facility])    basal cell; carcinoma removed x3 and SCC   Cataract    bilateral sx   Depression    Diabetes mellitus    pt denies   GERD (gastroesophageal reflux disease)    on meds   History of shingles 2009   Hyperlipidemia    on meds   Hypertension    on meds   Myocardial infarction ([place]) 1997   caused by a blood clot   Sleep apnea    not have a CPAP as of 02/27/2020-just dx   Past Surgical History:  Procedure Laterality Date   ANGIOPLASTY  1997   BACK SURGERY  04/02/2022   basel cell     skin carcinoma removed x 4   CARDIAC CATHETERIZATION     10/04/1999   COLONOSCOPY  2018   SA-MAC-suprep(good)-TA   DOPPLER ECHOCARDIOGRAPHY  07/09/2006   EF 50 to 55 %, LA mildy dilated   HERNIA REPAIR  1951   RIGHT   KNEE ARTHROSCOPY Right 2001   R   NM MYOCAR PERF WALL MOTION  08/22/2011   Mets 13,low risk study   POLYPECTOMY  2018   TA   WRIST SURGERY  1984   right   Patient Active Problem List   Diagnosis Date Noted   Cardiomegaly 06/23/2022   Hepatic steatosis 06/23/2022   Abdominal bloating 06/02/2022   Leg pain 02/18/2022   Injury of right shoulder 02/17/2022   Neuropathy associated  with endocrine disorder ([place]) 02/17/2022   Need for influenza vaccination 02/17/2022   Prediabetes 12/03/2021   Bilateral carpal tunnel syndrome 10/01/2021   Abnormal urinalysis 06/10/2021   Receptive aphasia 05/30/2021   Memory loss 05/30/2021   Cough due to ACE inhibitor 08/27/2020   Cough 08/03/2020   Lower abdominal pain 08/03/2020   Benign prostatic hyperplasia with post-void dribbling 08/03/2020   Paresthesias 08/03/2020   Anemia 08/03/2020   Alcohol use 07/22/2019   Essential hypertension 07/22/2019   Basal cell carcinoma (BCC) in situ of skin 05/14/2017   Sinus bradycardia 03/15/2015   CAD in native artery 03/15/2015   PCP NOTES >>> 02/21/2015   Hyperlipidemia with target LDL less than 70 06/08/2013   Palpitations 06/08/2013   DJD (degenerative joint disease) 03/16/2013   Healthcare maintenance 03/16/2013   Anxiety and depression 10/25/2012   Lipoma 03/20/2011   ERECTILE DYSFUNCTION 02/21/2008   CORONARY ARTERY DISEASE 02/21/2008   GERD 02/21/2008    PCP: Abelino Derrick, MD  REFERRING PROVIDER: Melina Schools, MD  REFERRING DIAG: L 3 L4 laminectomy  Rationale for Evaluation and Treatment: Rehabilitation  THERAPY DIAG:  History of lumbar laminectomy for spinal cord decompression  Difficulty in walking, not elsewhere classified  Chronic pain of left knee  Weakness of both legs  ONSET DATE: 05/03/23  SUBJECTIVE:                                                                                                                                                                                           SUBJECTIVE STATEMENT: I have a problem with my L knee , it hurts when I walk, and I have thigh and groin pain on same side with walking  PERTINENT HISTORY:  Underwent decompression L3-4 , also with chronic L knee pain.  Has recovered very well following the Lumbar surgery but has not been able to advance his activities , walking as desired due to L knee pain  PAIN:   Are you having pain? Yes: NPRS scale: 2/10 Pain location: L knee Pain description: with twisting, standing ,walking , climbing steps Aggravating factors: as above Relieving factors: resting, sitting  PRECAUTIONS: Back  WEIGHT BEARING RESTRICTIONS: No  FALLS:  Has patient fallen in last 6 months? Yes. 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  X rays  in 2023 B knees mod osteoarthritis B hips   PATIENT SURVEYS:  FOTO 46  SCREENING FOR RED FLAGS: Bowel or bladder incontinence: No Spinal tumors: No Cauda equina syndrome: No Compression fracture: No Abdominal aneurysm: No  COGNITION: Overall cognitive status: Within functional limits for tasks assessed     SENSATION: WFL  MUSCLE LENGTH: Hamstrings: Right wfl deg; Left wfl deg Marcello Moores test: Right -3 deg; Left -10 deg  POSTURE: decreased lumbar lordosis, increased thoracic kyphosis, and weight shift right  PALPATION: Painful L anterlor knee, also painful with overpressure into extension ant knee   LUMBAR ROM: end range NT due to recent surgery gross LS movement wnl   LOWER EXTREMITY ROM:     Active  Right eval Left eval  Hip flexion wfl wfl  Hip extension  Hip abduction    Hip adduction    Hip internal rotation wfl wfl  Hip external rotation    Knee flexion wfl wfl  Knee extension -3 -18  Ankle dorsiflexion wfl 4  Ankle plantarflexion wfl   Ankle inversion    Ankle eversion     (Blank  rows = not tested)  LOWER EXTREMITY MMT:    MMT Right eval Left eval  Hip flexion    Hip extension    Hip abduction    Hip adduction    Hip internal rotation    Hip external rotation    Knee flexion    Knee extension    Ankle dorsiflexion  4  Ankle plantarflexion    Ankle inversion    Ankle eversion      SLR MMT wnl B Locked bridge wnl B  Toe walking WNL Heel walking unable L, weak and fearful of falling   FUNCTIONAL TESTS:  Not performed today  GAIT: Distance walked: within clinic up to 100' Assistive device utilized:  none Level of assistance: Complete Independence Comments: pronounced consistent antalgic gait on L, with decreased hip extension L, and decreased step length R   TODAY'S TREATMENT:                                                                                                                              DATE: 06/25/22 Nustep 5 min, level 3, Ue's and LE's to address mid range joint movement B hips, knees ankles, cardiovascular endurance  Instructed in supine l hip flexor/ quads, stretch with gentle knee flex, ext for nerve glides Instructed in bridging in supine to recruit gluteals, posterior hips.   PATIENT EDUCATION:  Education details: POC, goals, expected response to treatment Person educated: Patient Education method: Explanation, Demonstration, Tactile cues, and Verbal cues Education comprehension: verbalized understanding, returned demonstration, verbal cues required, and needs further education  HOME EXERCISE PROGRAM: Access Code: [email or web address] URL: [email or web address] Date: 06/25/2022 Prepared by: Hayden Pedro  Exercises - Supine Bridge  - 1 x daily - 7 x weekly - 3 sets - 10 reps - Modified Thomas Stretch  - 1 x daily - 7 x weekly - 3 sets - 10 reps  ASSESSMENT:  CLINICAL IMPRESSION: Patient is a 72 y.o. male who was seen today for physical therapy evaluation and treatment for recovery following L3-4 laminectomy.  He is 7  weeks post op now.  Since his surgery he is experiencing new onset of L ant thigh pain replicated with rolling on L side in bed and when twisting L LE /hip with standing weight bearing.  He also is limited with his walking, reports able to walk about 1/8 mi then L knee/thigh pain cause him to need to sit down.  His also c/o some weakness/ giving away L LE.  Lower back pain is better as he is able to stand for longer periods of time and experiencing less pain lower back with walking.  Objectively he does have some end range ROM deficits L knee as well as L knee pain and antalgic gait pattern on L  He does have confirmed osteoarthritis L knee, has had multiple injections.  To see his orthopedist regarding the knee at the end of this week. His referring physician recommended aquatic therapy, but patient wishes to hold off on the aquatic PT today.  Will benefit from skilled PT to progress strength and endurance while taking care with L knee pain/ OA  OBJECTIVE IMPAIRMENTS: Abnormal gait, decreased activity tolerance, decreased endurance, difficulty walking, decreased ROM, and decreased strength.   ACTIVITY LIMITATIONS: carrying, lifting, bending, standing, squatting, sleeping, stairs, and locomotion level  PARTICIPATION LIMITATIONS: shopping, community activity, and yard work  PERSONAL FACTORS: Time since onset of injury/illness/exacerbation and 1-2 comorbidities: OA L knee,HTN, DM   are also affecting patient's functional outcome.   REHAB POTENTIAL: Good  CLINICAL DECISION MAKING: Stable/uncomplicated  EVALUATION COMPLEXITY: Low   GOALS: Goals reviewed with patient? Yes  SHORT TERM GOALS: Target date: 07/09/22  I HEP  Baseline:initiated today Goal status: INITIAL   LONG TERM GOALS: Target date: 09/17/22  Able to walk 6 min without deficits while maintaining gait speed of 98msec or greater for efficient community ambulation Baseline:  Goal status: INITIAL  2.  Improve FOTO to 57 Baseline:  46 Goal status: INITIAL  3.  Able to ascend, descend 8 steps with rail safely and while maintaining upright posture through spine Baseline: flexes through spine Goal status: INITIAL  4.  Increase functional strength/balance LE's, able to heel walk 10' without loss of balance or fear of falling Baseline: unable at initial eval on L Goal status: INITIAL    PLAN:  PT FREQUENCY: 1-2x/week  PT DURATION: 12 weeks  PLANNED INTERVENTIONS: Therapeutic exercises, Therapeutic activity, Neuromuscular re-education, Balance training, Gait training, Patient/Family education, Self Care, Joint mobilization, and Aquatic Therapy.  PLAN FOR NEXT SESSION: progress with additional cardiovascular endurance with nustep or recumbent bicycle, add more gluteal strengthening, stretch B hip flexors and hamstrings.   Fareedah Mahler L Ellasyn Swilling, PT 06/25/2022, 4:32 PM

## 2022-06-27 ENCOUNTER — Telehealth: Payer: Self-pay | Admitting: *Deleted

## 2022-06-27 DIAGNOSIS — M1712 Unilateral primary osteoarthritis, left knee: Secondary | ICD-10-CM | POA: Diagnosis not present

## 2022-06-27 NOTE — Telephone Encounter (Signed)
   Pre-operative Risk Assessment    Patient Name: Dakota Gibbs  DOB: 11/14/1950 MRN: [id number]      Request for Surgical Clearance    Procedure:   Left Total Knee Replacement   Date of Surgery:  Clearance TBD                                 Surgeon:  Dr.Jeffrey Beane Surgeon's Group or Practice Name:  [facility] Phone number:  [phone] Fax number:  [phone]   Type of Clearance Requested:   - Medical  - Pharmacy:  Hold Aspirin Not Indicated   Type of Anesthesia:  Not Indicated   Additional requests/questions:    Signed, Greer Ee   06/27/2022, 12:30 PM

## 2022-06-27 NOTE — Telephone Encounter (Signed)
   Name: Dakota Gibbs  DOB: 07/18/1950  MRN: [id number]  Primary Cardiologist: Shelva Majestic, MD   Preoperative team, please contact this patient and set up a phone call appointment for further preoperative risk assessment. Please obtain consent and complete medication review. Thank you for your help.  I confirm that guidance regarding antiplatelet and oral anticoagulation therapy has been completed and, if necessary, noted below.  Per office protocol, if patient is without any new symptoms or concerns at the time of their virtual visit, she may hold Aspirin for 5-7 days prior to procedure. Please resume Aspirin as soon as possible postprocedure, at the discretion of the surgeon.    [name], NP 06/27/2022, 12:48 PM [place]

## 2022-06-27 NOTE — Telephone Encounter (Signed)
Spoke with patient who is agreeable to do a tele visit on 3/7 at 10 am. Patient states that his surgery will be scheduled towards the end of March. Consent has been done and med rec needs to be completed.

## 2022-06-28 ENCOUNTER — Encounter: Payer: Self-pay | Admitting: Cardiovascular Disease

## 2022-06-30 MED ORDER — HYDROCHLOROTHIAZIDE 12.5 MG PO CAPS: ORAL_CAPSULE | ORAL | 3 refills | Status: DC

## 2022-07-02 ENCOUNTER — Encounter: Payer: Self-pay | Admitting: Family Medicine

## 2022-07-03 ENCOUNTER — Ambulatory Visit: Payer: Medicare Other | Admitting: Family Medicine

## 2022-07-03 ENCOUNTER — Telehealth: Payer: Self-pay | Admitting: *Deleted

## 2022-07-03 NOTE — Telephone Encounter (Signed)
Patient returned a call to me and expressed that he wants to change from a CPAP machine to a oral device. I informed him that I will need to relay this to Dr Claiborne Billings once he returns to the office. It may be that due to him having some central events he might not be able to use a oral appliance, but this will be deferred to Dr Claiborne Billings. Patient voiced understanding and will wait to hear back from Dr Claiborne Billings.

## 2022-07-03 NOTE — Telephone Encounter (Signed)
Message left to return a call to discuss his mask request.

## 2022-07-04 ENCOUNTER — Encounter: Payer: Self-pay | Admitting: Cardiovascular Disease

## 2022-07-07 ENCOUNTER — Ambulatory Visit: Payer: Medicare Other | Attending: Orthopedic Surgery

## 2022-07-07 ENCOUNTER — Ambulatory Visit: Payer: Medicare Other

## 2022-07-07 ENCOUNTER — Other Ambulatory Visit: Payer: Self-pay

## 2022-07-07 DIAGNOSIS — M25562 Pain in left knee: Secondary | ICD-10-CM | POA: Diagnosis not present

## 2022-07-07 DIAGNOSIS — G8929 Other chronic pain: Secondary | ICD-10-CM | POA: Diagnosis not present

## 2022-07-07 DIAGNOSIS — R262 Difficulty in walking, not elsewhere classified: Secondary | ICD-10-CM | POA: Diagnosis not present

## 2022-07-07 DIAGNOSIS — Z9889 Other specified postprocedural states: Secondary | ICD-10-CM | POA: Insufficient documentation

## 2022-07-07 DIAGNOSIS — R29898 Other symptoms and signs involving the musculoskeletal system: Secondary | ICD-10-CM | POA: Insufficient documentation

## 2022-07-07 NOTE — Therapy (Signed)
OUTPATIENT PHYSICAL THERAPY THORACOLUMBAR TREATMENT   Patient Name: Dakota Gibbs MRN: [id number] DOB:08/27/1950, 72 y.o., male Today's Date: 07/07/2022  END OF SESSION:  PT End of Session - 07/07/22 1704     Visit Number 2    Date for PT Re-Evaluation 09/17/22    PT Start Time 0845    PT Stop Time 0930    PT Time Calculation (min) 45 min    Activity Tolerance Patient tolerated treatment well    Behavior During Therapy [facility] for tasks assessed/performed             Past Medical History:  Diagnosis Date   Allergy    enviornmental   Anxiety    Anxiety and depression 10/25/2012   Arthritis    self dx   Back pain    L4-L5 bulging disc, L5-S1 - bulging disc, pinched nerve in neck   CAD (coronary artery disease)      Dr Claiborne Billings, s/p stents----- sees cards yearly    Cancer [facility])    basal cell; carcinoma removed x3 and SCC   Cataract    bilateral sx   Depression    Diabetes mellitus    pt denies   GERD (gastroesophageal reflux disease)    on meds   History of shingles 2009   Hyperlipidemia    on meds   Hypertension    on meds   Myocardial infarction ([place]) 1997   caused by a blood clot   Sleep apnea    not have a CPAP as of 02/27/2020-just dx   Past Surgical History:  Procedure Laterality Date   ANGIOPLASTY  1997   BACK SURGERY  04/02/2022   basel cell     skin carcinoma removed x 4   CARDIAC CATHETERIZATION     10/04/1999   COLONOSCOPY  2018   SA-MAC-suprep(good)-TA   DOPPLER ECHOCARDIOGRAPHY  07/09/2006   EF 50 to 55 %, LA mildy dilated   HERNIA REPAIR  1951   RIGHT   KNEE ARTHROSCOPY Right 2001   R   NM MYOCAR PERF WALL MOTION  08/22/2011   Mets 13,low risk study   POLYPECTOMY  2018   TA   WRIST SURGERY  1984   right   Patient Active Problem List   Diagnosis Date Noted   Cardiomegaly 06/23/2022   Hepatic steatosis 06/23/2022   Abdominal bloating 06/02/2022   Leg pain 02/18/2022   Injury of right shoulder 02/17/2022   Neuropathy associated  with endocrine disorder ([place]) 02/17/2022   Need for influenza vaccination 02/17/2022   Prediabetes 12/03/2021   Bilateral carpal tunnel syndrome 10/01/2021   Abnormal urinalysis 06/10/2021   Receptive aphasia 05/30/2021   Memory loss 05/30/2021   Cough due to ACE inhibitor 08/27/2020   Cough 08/03/2020   Lower abdominal pain 08/03/2020   Benign prostatic hyperplasia with post-void dribbling 08/03/2020   Paresthesias 08/03/2020   Anemia 08/03/2020   Alcohol use 07/22/2019   Essential hypertension 07/22/2019   Basal cell carcinoma (BCC) in situ of skin 05/14/2017   Sinus bradycardia 03/15/2015   CAD in native artery 03/15/2015   PCP NOTES >>> 02/21/2015   Hyperlipidemia with target LDL less than 70 06/08/2013   Palpitations 06/08/2013   DJD (degenerative joint disease) 03/16/2013   Healthcare maintenance 03/16/2013   Anxiety and depression 10/25/2012   Lipoma 03/20/2011   ERECTILE DYSFUNCTION 02/21/2008   CORONARY ARTERY DISEASE 02/21/2008   GERD 02/21/2008    PCP: Abelino Derrick, MD  REFERRING PROVIDER: Melina Schools, MD  REFERRING DIAG: L 3 L4 laminectomy  Rationale for Evaluation and Treatment: Rehabilitation  THERAPY DIAG:  History of lumbar laminectomy for spinal cord decompression  Difficulty in walking, not elsewhere classified  Chronic pain of left knee  Weakness of both legs  ONSET DATE: 05/03/23  SUBJECTIVE:                                                                                                                                                                                           SUBJECTIVE STATEMENT: I saw the orthopedist about my L knee , He wants to do a TKA.  So I'd like to be shown exercises for my knee and for my back too.  I'm going to the gym 4 x week so would like to be shown some exercises for strengthening that I can do at the gym on their equipment.   PERTINENT HISTORY:  Underwent decompression L3-4 , also with chronic L knee  pain.  Has recovered very well following the Lumbar surgery but has not been able to advance his activities , walking as desired due to L knee pain  PAIN:  Are you having pain? Yes: NPRS scale: 2/10 Pain location: L knee Pain description: with twisting, standing ,walking , climbing steps Aggravating factors: as above Relieving factors: resting, sitting  PRECAUTIONS: Back  WEIGHT BEARING RESTRICTIONS: No  FALLS:  Has patient fallen in last 6 months? Yes. 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  X rays  in 2023 B knees mod osteoarthritis B hips   PATIENT SURVEYS:  FOTO 46  SCREENING FOR RED FLAGS: Bowel or bladder incontinence: No Spinal tumors: No Cauda equina syndrome: No Compression fracture: No Abdominal aneurysm: No  COGNITION: Overall cognitive status: Within functional limits for tasks assessed     SENSATION: WFL  MUSCLE LENGTH: Hamstrings: Right wfl deg; Left wfl deg Marcello Moores test: Right -3 deg; Left -10 deg  POSTURE: decreased lumbar lordosis, increased thoracic kyphosis, and weight shift right  PALPATION: Painful L anterlor knee, also painful with overpressure into extension ant knee   LUMBAR ROM: end range  NT due to recent surgery gross LS movement wnl   LOWER EXTREMITY ROM:     Active  Right eval Left eval  Hip flexion wfl wfl  Hip extension    Hip abduction    Hip adduction    Hip internal rotation wfl wfl  Hip external  rotation    Knee flexion wfl wfl  Knee extension -3 -18  Ankle dorsiflexion wfl 4  Ankle plantarflexion wfl   Ankle inversion    Ankle eversion     (Blank rows = not tested)  LOWER EXTREMITY MMT:    MMT Right eval Left eval  Hip flexion    Hip extension    Hip abduction    Hip adduction    Hip internal rotation    Hip external rotation    Knee flexion    Knee extension    Ankle dorsiflexion  4  Ankle plantarflexion    Ankle inversion    Ankle eversion      SLR MMT wnl B Locked bridge wnl B  Toe walking WNL Heel walking unable L, weak and fearful of falling   FUNCTIONAL TESTS:  Not performed today  GAIT: Distance walked: within clinic up to 100' Assistive device utilized:  none Level of assistance: Complete Independence Comments: pronounced consistent antalgic gait on L, with decreased hip extension L, and decreased step length R   TODAY'S TREATMENT:                                                                                                                              DATE:  07/07/22:  Nustep 6 min, Ue's and LE's Heel raises forefeet on bar 20x cues for upright posture Hamstrings curls B 35#, 15x 2 sets Leg press B 40# 15 x 2 sets emphasized shifting weight to post heels, also positioning heels in front of knees Standing 3 way hip 5# L LE only, with pulley system 15x each Seated hamstring stretch 60 sec holds, with strap, 3 x each side  06/25/22 Nustep 5 min, level 3, Ue's and LE's to address mid range joint movement B hips, knees ankles, cardiovascular endurance  Instructed in supine l hip flexor/ quads, stretch with gentle knee flex, ext for nerve glides Instructed in bridging in supine to recruit gluteals, posterior hips.   PATIENT EDUCATION:  Education details: POC, goals, expected response to treatment Person educated: Patient Education method: Explanation, Demonstration, Tactile cues, and Verbal cues Education comprehension: verbalized  understanding, returned demonstration, verbal cues required, and needs further education  HOME EXERCISE PROGRAM: Access Code: [email or web address] URL: [email or web address] Date: 07/07/2022 Prepared by: Amal Renbarger  Exercises - Hip Abduction with Resistance Loop  - 1 x daily - 7 x weekly - 3 sets - 10 reps - Standing Hip Flexion with Resistance Loop  - 1 x daily - 7 x weekly - 3 sets - 10 reps - Hip Extension with Resistance Loop  - 1 x daily - 7 x  weekly - 3 sets - 10 reps - Heel Raise  - 1 x daily - 7 x weekly - 3 sets - 10 reps - Seated Hamstring Stretch  - 1 x daily - 7 x weekly - 3 sets - 10 reps - Seated Hamstring Curl with Anchored Resistance  - 1 x daily - 7 x weekly - 3 sets - 10 reps Access Code: [email or web address] URL: [email or web address] Date: 06/25/2022 Prepared by: Hayden Pedro  Exercises - Supine Bridge  - 1 x daily - 7 x weekly - 3 sets - 10 reps - Modified Thomas Stretch  - 1 x daily - 7 x weekly - 3 sets - 10 reps  ASSESSMENT:  CLINICAL IMPRESSION: Patient is a 72 y.o. male who was seen today for PT treatment for recovery following L3-4 laminectomy.  He is 9 weeks post op now.  Saw orthopedist, now has an off loading brace for L knee, initiated more specific L hip, Le strengthening due to upcoming L TKA.  Wants to perform at his gym.  Avoided long arc quads today and weight bearing ex L Le due to his advanced OA. Will benefit from ongoing skilled PT to progress strength and endurance while taking care with L knee pain/ OA  OBJECTIVE IMPAIRMENTS: Abnormal gait, decreased activity tolerance, decreased endurance, difficulty walking, decreased ROM, and decreased strength.   ACTIVITY LIMITATIONS: carrying, lifting, bending, standing, squatting, sleeping, stairs, and locomotion level  PARTICIPATION LIMITATIONS: shopping, community activity, and yard work  PERSONAL FACTORS: Time since onset of injury/illness/exacerbation and 1-2 comorbidities: OA L knee,HTN, DM    are also affecting patient's functional outcome.   REHAB POTENTIAL: Good  CLINICAL DECISION MAKING: Stable/uncomplicated  EVALUATION COMPLEXITY: Low   GOALS: Goals reviewed with patient? Yes  SHORT TERM GOALS: Target date: 07/09/22  I HEP  Baseline:initiated today Goal status: IN PROGRESS   LONG TERM GOALS: Target date: 09/17/22  Able to walk 6 min without deficits while maintaining gait speed of 75msec or greater for efficient community ambulation Baseline:  Goal status: IN PROGRESS  2.  Improve FOTO to 57 Baseline: 46 Goal status: IN PROGRESS  3.  Able to ascend, descend 8 steps with rail safely and while maintaining upright posture through spine Baseline: flexes through spine Goal status: IN PROGRESS  4.  Increase functional strength/balance LE's, able to heel walk 10' without loss of balance or fear of falling Baseline: unable at initial eval on L Goal status: IN PROGRESS    PLAN:  PT FREQUENCY: 1-2x/week  PT DURATION: 12 weeks  PLANNED INTERVENTIONS: Therapeutic exercises, Therapeutic activity, Neuromuscular re-education, Balance training, Gait training, Patient/Family education, Self Care, Joint mobilization, and Aquatic Therapy.  PLAN FOR NEXT SESSION: progress with additional cardiovascular endurance with nustep or recumbent bicycle, add more gluteal strengthening, stretch B hip flexors and hamstrings.   Gretchen Weinfeld L Araseli Sherry, PT 07/07/2022, 5:14 PM

## 2022-07-08 DIAGNOSIS — H5211 Myopia, right eye: Secondary | ICD-10-CM | POA: Diagnosis not present

## 2022-07-08 LAB — HM DIABETES EYE EXAM

## 2022-07-09 NOTE — Progress Notes (Unsigned)
Virtual Visit via Telephone Note   Because of Dakota Gibbs's co-morbid illnesses, he is at least at moderate risk for complications without adequate follow up.  This format is felt to be most appropriate for this patient at this time.  The patient did not have access to video technology/had technical difficulties with video requiring transitioning to audio format only (telephone).  All issues noted in this document were discussed and addressed.  No physical exam could be performed with this format.  Please refer to the patient's chart for his consent to telehealth for [facility].  Evaluation Performed:  Preoperative cardiovascular risk assessment _____________   Date:  07/09/2022   Patient ID:  Dakota Gibbs, DOB April 28, 1951, MRN [id number] Patient Location:  Home Provider location:   Office  Primary Care Provider:  Libby Maw, MD Primary Cardiologist:  Dakota Majestic, MD  Chief Complaint / Patient Profile   72 y.o. y/o male with a h/o CAD s/p inferior MI 05/10/1995 with PCI to totally occluded RCA, [name] disease treated medically, staged intervention to LCx marginal vessel, cardiac catheterization 2001 did not show any restenosis, hypertension, hyperlipidemia, GERD, OSA on CPAP, mild dilatation of aortic root who is pending left total knee replacement and presents today for telephonic preoperative cardiovascular risk assessment.  History of Present Illness    Dakota Gibbs is a 72 y.o. male who presents via audio/video conferencing for a telehealth visit today.  Pt was last seen in cardiology clinic on 02/25/22 by Dakota Adas, PA.  At that time Dakota Gibbs was doing well.  The patient is now pending procedure as outlined above. Since his last visit, he *** denies chest pain, shortness of breath, lower extremity edema, fatigue, palpitations, melena, hematuria, hemoptysis, diaphoresis, weakness, presyncope, syncope, orthopnea, and PND.   Past  Medical History    Past Medical History:  Diagnosis Date   Allergy    enviornmental   Anxiety    Anxiety and depression 10/25/2012   Arthritis    self dx   Back pain    L4-L5 bulging disc, L5-S1 - bulging disc, pinched nerve in neck   CAD (coronary artery disease)      Dr Dakota Gibbs, s/p stents----- sees cards yearly    Cancer [facility])    basal cell; carcinoma removed x3 and SCC   Cataract    bilateral sx   Depression    Diabetes mellitus    pt denies   GERD (gastroesophageal reflux disease)    on meds   History of shingles 2009   Hyperlipidemia    on meds   Hypertension    on meds   Myocardial infarction ([place]) 1997   caused by a blood clot   Sleep apnea    not have a CPAP as of 02/27/2020-just dx   Past Surgical History:  Procedure Laterality Date   ANGIOPLASTY  1997   BACK SURGERY  04/02/2022   basel cell     skin carcinoma removed x 4   CARDIAC CATHETERIZATION     10/04/1999   COLONOSCOPY  2018   SA-MAC-suprep(good)-TA   DOPPLER ECHOCARDIOGRAPHY  07/09/2006   EF 50 to 55 %, LA mildy dilated   HERNIA REPAIR  1951   RIGHT   KNEE ARTHROSCOPY Right 2001   R   NM MYOCAR PERF WALL MOTION  08/22/2011   Mets 13,low risk study   POLYPECTOMY  2018   TA   WRIST SURGERY  1984   right  Allergies  No Known Allergies  Home Medications    Prior to Admission medications   Medication Sig Start Date End Date Taking? Authorizing Provider  amLODipine (NORVASC) 5 MG tablet TAKE ONE TABLET BY MOUTH DAILY 01/01/22   Dakota Maw, MD  aspirin EC 81 MG tablet Take 1 tablet (81 mg total) by mouth daily. 06/27/14   Troy Sine, MD  atorvastatin (LIPITOR) 40 MG tablet TAKE 1 TABLET EVERY DAY AT 6 PM 04/29/22   Troy Sine, MD  cetirizine (ZYRTEC) 10 MG tablet Take 10 mg by mouth daily.    [provider]  citalopram (CELEXA) 20 MG tablet Take 1 and 1/2 tablets daily 07/21/21   Dakota Maw, MD  ezetimibe (ZETIA) 10 MG tablet TAKE ONE TABLET BY  MOUTH DAILY 04/29/22   Troy Sine, MD  FLUTICASONE PROPIONATE, NASAL, NA in the morning. 07/03/20   [provider]  gabapentin (NEURONTIN) 300 MG capsule Take 300 mg by mouth 3 (three) times daily. 02/11/22   [provider]  hydrochlorothiazide (MICROZIDE) 12.5 MG capsule TAKE 2 CAPSULES EVERY OTHER DAY ALTERNATING WITH TAKE 1 CAPSULE EVERY OTHER DAY AS DIRECTED 06/30/22   Troy Sine, MD  metFORMIN (GLUCOPHAGE-XR) 500 MG 24 hr tablet TAKE ONE TABLET BY MOUTH EVERY DAY WITH BREAKFAST 05/02/22   Dakota Maw, MD  metoprolol tartrate (LOPRESSOR) 25 MG tablet TAKE 1/2 TO 1 TABLET EVERY DAY  AS NEEDED (HEART PALPITATIONS). 10/22/20   Troy Sine, MD  Multiple Vitamin (MULTIVITAMIN) tablet Take 1 tablet by mouth daily.    [provider]  nitroGLYCERIN (NITROSTAT) 0.4 MG SL tablet For chest pain, tightness, or pressure. While sitting, place 1 tablet under tongue. May be used every 5 minutes as needed, for up to 15 minutes. Do not use more than 3 tablets. 06/16/22   Troy Sine, MD  olmesartan (BENICAR) 20 MG tablet Take 1 tablet (20 mg total) by mouth daily. 06/05/22   Dakota Maw, MD  oxymetazoline (AFRIN) 0.05 % nasal spray Place 1 spray into both nostrils daily.    [provider]  tadalafil (CIALIS) 20 MG tablet May take one or two up to every other day as needed for ed. 08/27/20   Dakota Maw, MD  vitamin C (ASCORBIC ACID) 500 MG tablet Take 500 mg by mouth daily.    [provider]  vitamin E 400 UNIT capsule Take 400 Units by mouth daily.    [provider]    Physical Exam    Vital Signs:  Dakota Gibbs does not have vital signs available for review today.***  Given telephonic nature of communication, physical exam is limited. AAOx3. NAD. Normal affect.  Speech and respirations are unlabored.  Accessory Clinical Findings    None  Assessment & Plan    1.  Preoperative Cardiovascular  Risk Assessment:  The patient was advised that if he develops new symptoms prior to surgery to contact our office to arrange for a follow-up visit, and he verbalized understanding.  Per office protocol, if patient is without any new symptoms or concerns at the time of their virtual visit, she may hold Aspirin for 5-7 days prior to procedure. Please resume Aspirin as soon as possible postprocedure, at the discretion of the surgeon.   A copy of this note will be routed to requesting surgeon.  Time:   Today, I have spent *** minutes with the patient with telehealth technology discussing medical history, symptoms,  and management plan.     Emmaline Life, NP  07/09/2022, 7:09 AM

## 2022-07-10 ENCOUNTER — Ambulatory Visit: Payer: Medicare Other | Attending: Cardiovascular Disease | Admitting: Nurse Practitioner

## 2022-07-10 ENCOUNTER — Encounter: Payer: Self-pay | Admitting: Nurse Practitioner

## 2022-07-10 DIAGNOSIS — Z01810 Encounter for preprocedural cardiovascular examination: Secondary | ICD-10-CM | POA: Diagnosis not present

## 2022-07-11 ENCOUNTER — Ambulatory Visit: Payer: Medicare Other | Admitting: Physical Therapy

## 2022-07-14 ENCOUNTER — Ambulatory Visit: Payer: Medicare Other | Admitting: Family Medicine

## 2022-07-14 ENCOUNTER — Ambulatory Visit: Payer: Medicare Other

## 2022-07-15 DIAGNOSIS — M25511 Pain in right shoulder: Secondary | ICD-10-CM | POA: Diagnosis not present

## 2022-07-16 ENCOUNTER — Ambulatory Visit: Payer: Self-pay | Admitting: Orthopedic Surgery

## 2022-07-18 ENCOUNTER — Ambulatory Visit: Payer: Medicare Other | Admitting: Physical Therapy

## 2022-07-18 IMAGING — MR MR HEAD W/O CM
10 series · 48 of 48 positions shown · non-contrast
Comparison: No pertinent prior exams available for comparison.

CLINICAL DATA: Provided history: Memory loss. Additional history
provided by scanning technologist: Memory loss/confusion,
communication difficulty for a few months.

EXAM:
MRI HEAD WITHOUT CONTRAST
TECHNIQUE: Multiplanar, multiecho pulse sequences of the brain and surrounding
structures were obtained without intravenous contrast.

[Series 2: T1 · sagittal · 5.0mm · 0.49mm/px · 2 of 24 slices shown]
[im 1/24]
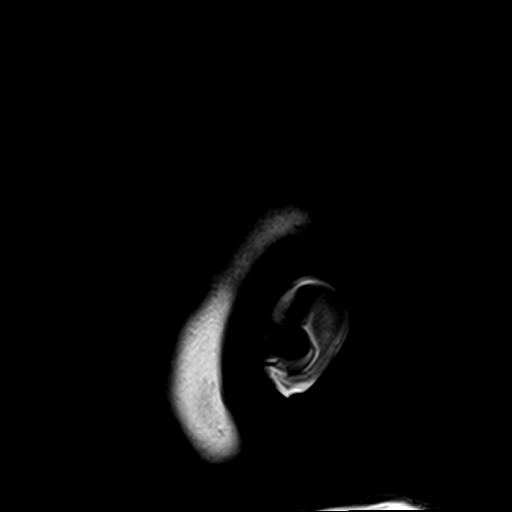
[im 24/24]
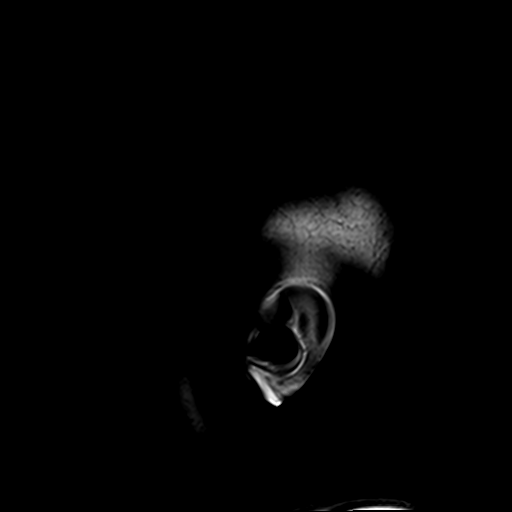

[Series 3: DWI · axial · 3.0mm · 1.88mm/px · z∈[-48,+106]mm · 9 of 110 slices shown (1 of 4)]
[im 1/110]
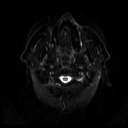
[im 14/110]
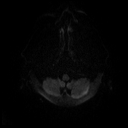
[im 28/110]
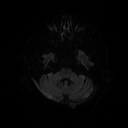
[im 41/110]
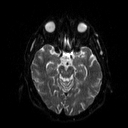
[im 55/110]
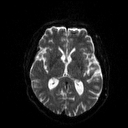
[im 69/110]
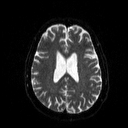
[im 82/110]
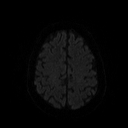
[im 96/110]
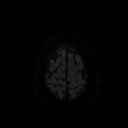
[im 110/110]
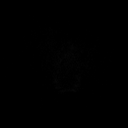

[Series 4: DWI · axial · 3.0mm · 1.88mm/px · z∈[-48,+106]mm · 5 of 55 slices shown (2 of 4)]
[im 1/55]
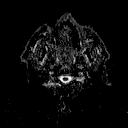
[im 14/55]
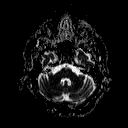
[im 28/55]
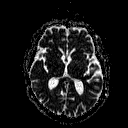
[im 41/55]
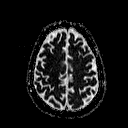
[im 55/55]
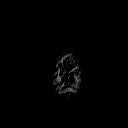

[Series 5: DWI · coronal · 5.0mm · 1.80mm/px · 6 of 72 slices shown (3 of 4)]
[im 1/72]
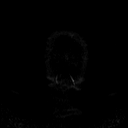
[im 15/72]
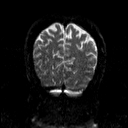
[im 29/72]
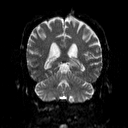
[im 43/72]
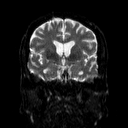
[im 57/72]
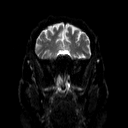
[im 72/72]
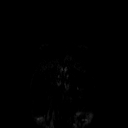

[Series 6: DWI · coronal · 5.0mm · 1.80mm/px · 3 of 36 slices shown (4 of 4)]
[im 1/36]
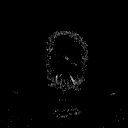
[im 18/36]
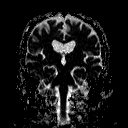
[im 36/36]
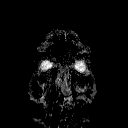

[Series 7: T2 · axial · 5.0mm · 0.75mm/px · z∈[-48,+106]mm · 2 of 24 slices shown (1 of 2)]
[im 1/24]
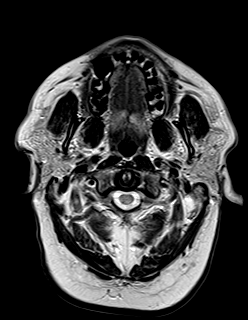
[im 24/24]
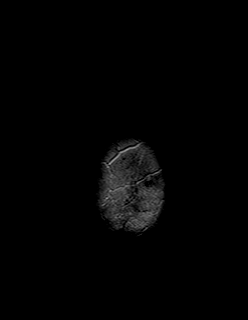

[Series 8: FLAIR · axial · 3.0mm · 0.47mm/px · z∈[-47,+104]mm · 3 of 35 slices shown]
[im 1/35]
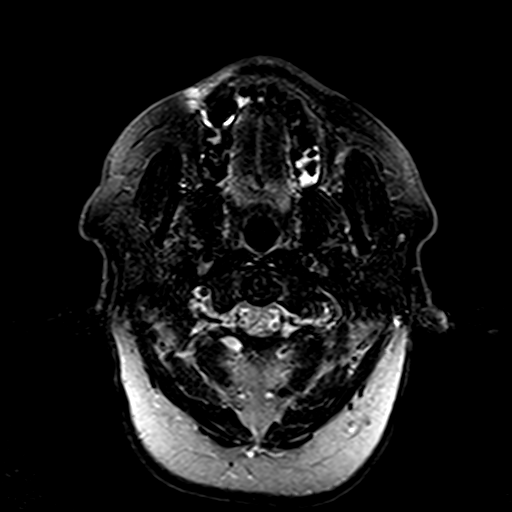
[im 18/35]
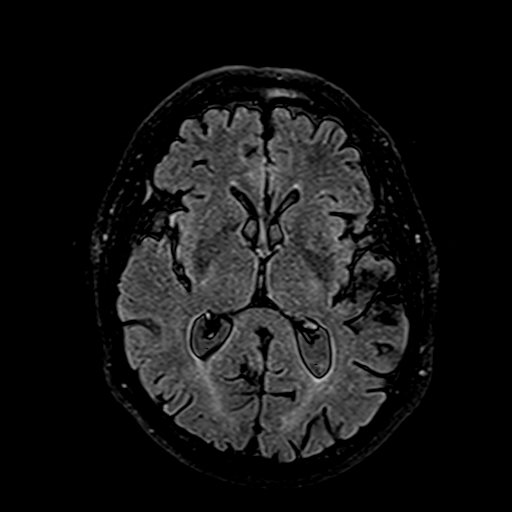
[im 35/35]
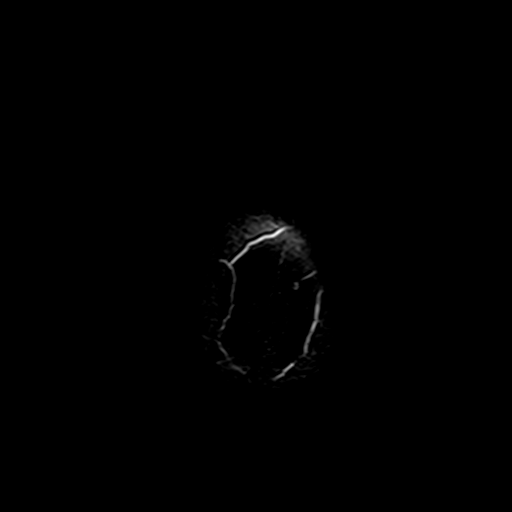

[Series 10: swi_images · axial · 4.0mm · 0.94mm/px · z∈[-39,+107]mm · 3 of 40 slices shown]
[im 1/40]
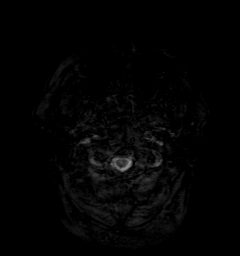
[im 20/40]
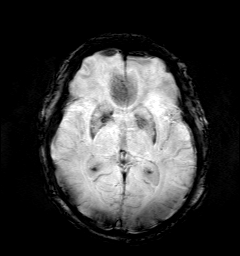
[im 40/40]
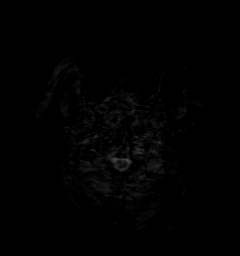

[Series 11: t1_mpr_tra · axial · 1.0mm · 0.75mm/px · z∈[-47,+105]mm · 13 of 160 slices shown]
[im 1/160]
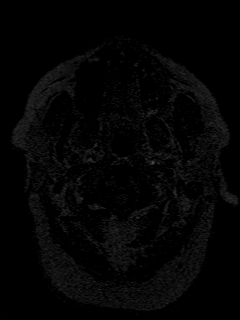
[im 14/160]
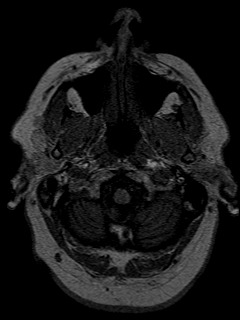
[im 27/160]
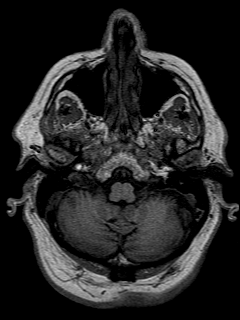
[im 40/160]
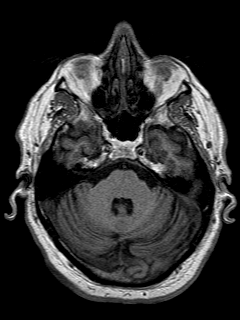
[im 54/160]
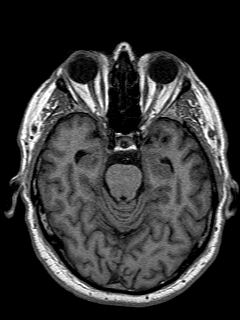
[im 67/160]
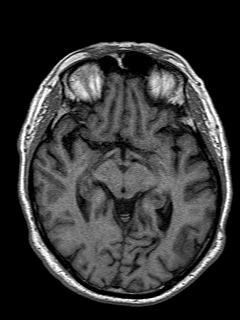
[im 80/160]
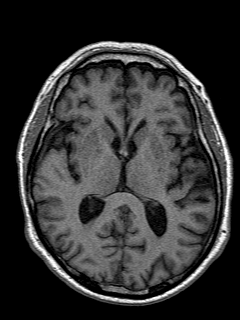
[im 93/160]
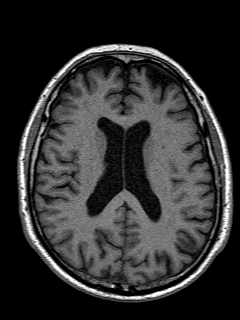
[im 107/160]
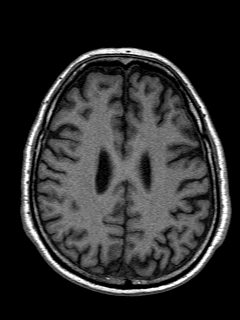
[im 120/160]
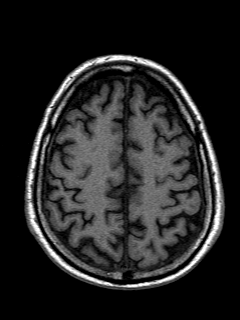
[im 133/160]
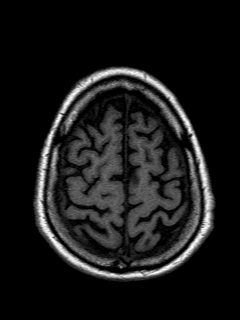
[im 146/160]
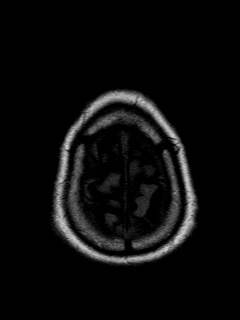
[im 160/160]
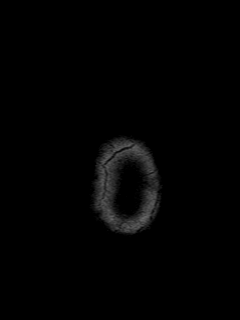

[Series 12: T2 · coronal · 5.0mm · 0.45mm/px · 2 of 28 slices shown (2 of 2)]
[im 1/28]
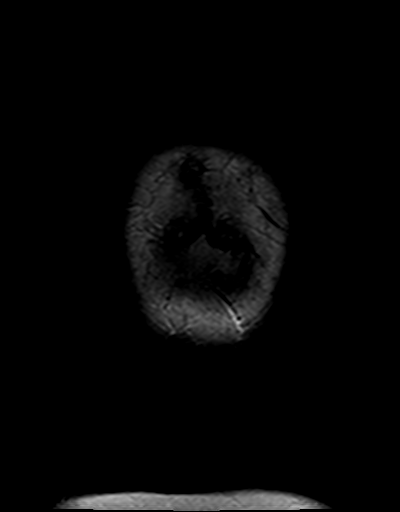
[im 28/28]
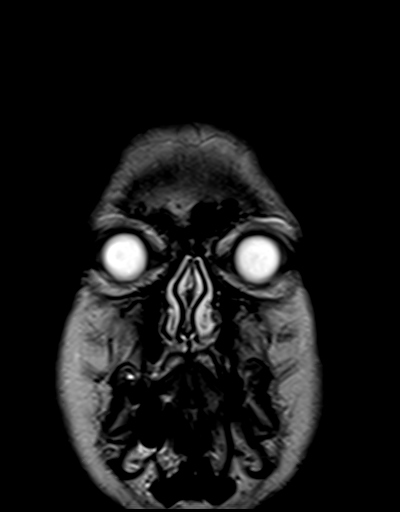

[48 of 48 positions shown; findings below may reference images not displayed]

FINDINGS: Brain:

Mild generalized cerebral and cerebellar atrophy.

Mild multifocal T2 FLAIR hyperintense signal abnormality within the
cerebral white matter, nonspecific but compatible with chronic small
vessel ischemic disease.

There is no acute infarct.

No evidence of an intracranial mass.

No chronic intracranial blood products.

No extra-axial fluid collection.

No midline shift.

Vascular: Maintained flow voids within the proximal large arterial
vessels.

Skull and upper cervical spine: No focal suspicious marrow lesion.

Sinuses/Orbits: Visualized orbits show no acute finding. Bilateral
ocular lens replacements. Trace mucosal thickening within the
bilateral ethmoid sinuses.
IMPRESSION: No evidence of acute intracranial abnormality.

Mild chronic small vessel ischemic changes within the cerebral white
matter.

Mild generalized cerebral and cerebellar atrophy.

## 2022-07-21 ENCOUNTER — Ambulatory Visit: Payer: Medicare Other | Admitting: Physical Therapy

## 2022-07-24 DIAGNOSIS — G5603 Carpal tunnel syndrome, bilateral upper limbs: Secondary | ICD-10-CM | POA: Diagnosis not present

## 2022-07-24 IMAGING — US US ABDOMEN COMPLETE W/ ELASTOGRAPHY
2 series · 12 of 25 positions shown · non-contrast
Comparison: None

CLINICAL DATA: Elevated urobilinogen, abnormal urinalysis, history
diabetes mellitus, coronary artery disease post MI and coronary
stenting, hyperlipidemia, hypertension, GERD, former smoker

EXAM:
ULTRASOUND ABDOMEN
ULTRASOUND HEPATIC ELASTOGRAPHY
TECHNIQUE: Sonography of the upper abdomen was performed. In addition,
ultrasound elastography evaluation of the liver was performed. A
region of interest was placed within the right lobe of the liver.
Following application of a compressive sonographic pulse, tissue
compressibility was assessed. Multiple assessments were performed at
the selected site. Median tissue compressibility was determined.
Previously, hepatic stiffness was assessed by shear wave velocity.
Based on recently published Society of Radiologists in Ultrasound
consensus article, reporting is now recommended to be performed in
the SI units of pressure (kiloPascals) representing hepatic
stiffness/elasticity. The obtained result is compared to the
published reference standards. (cACLD = compensated Advanced Chronic
Liver Disease)

[Series 1: us abdomen complete w/elastography · 93 acquisitions, 9 frames shown]
[im 6/93]
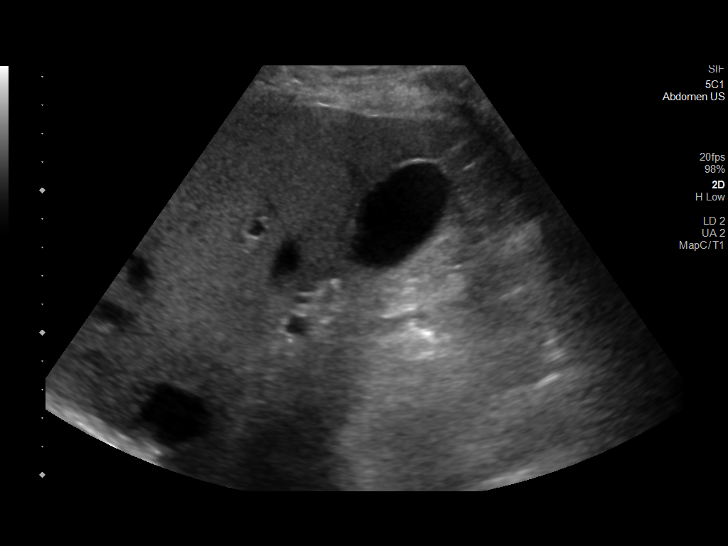
[im 17/93]
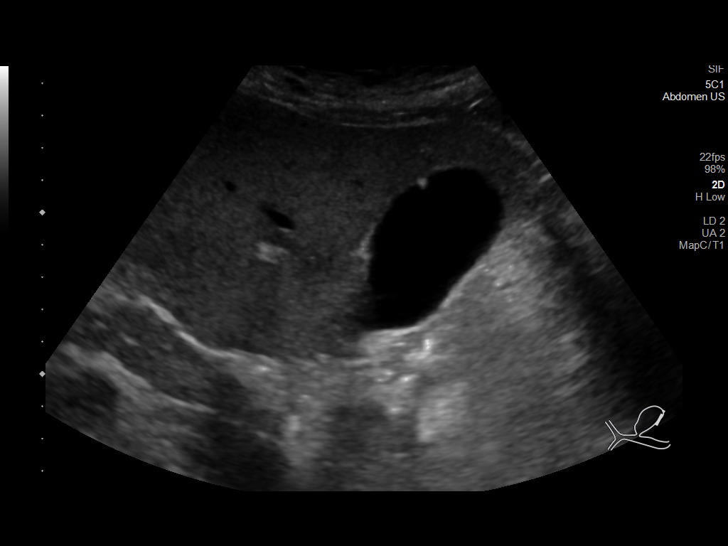
[im 28/93]
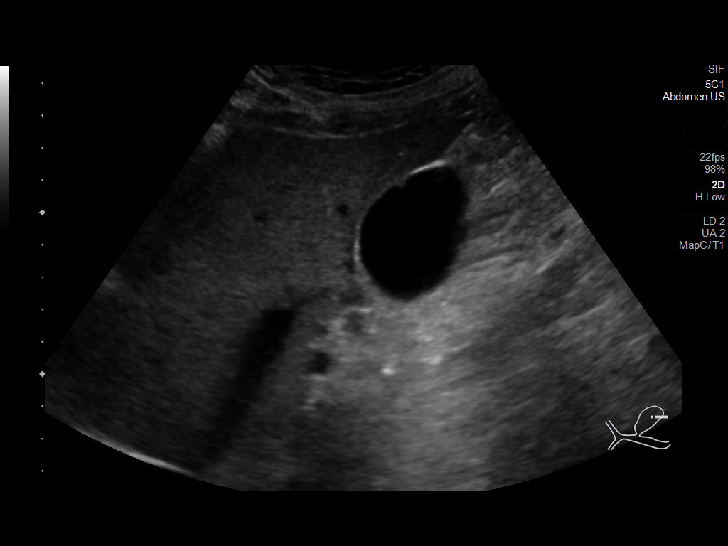
[im 38/93]
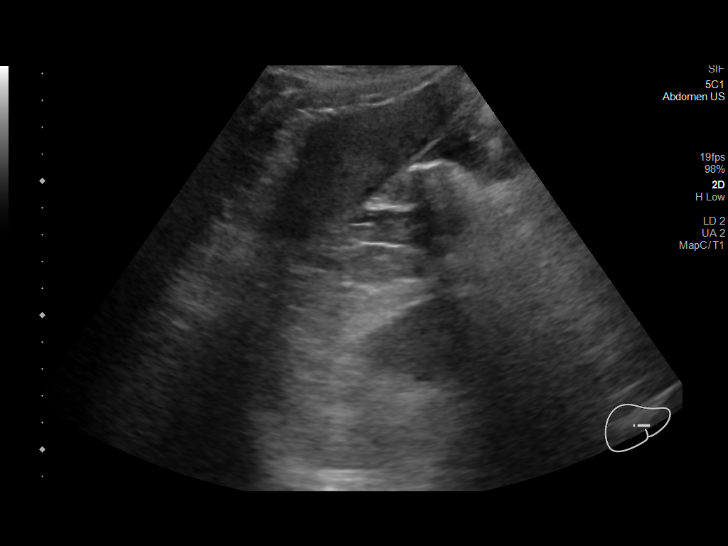
[im 49/93]
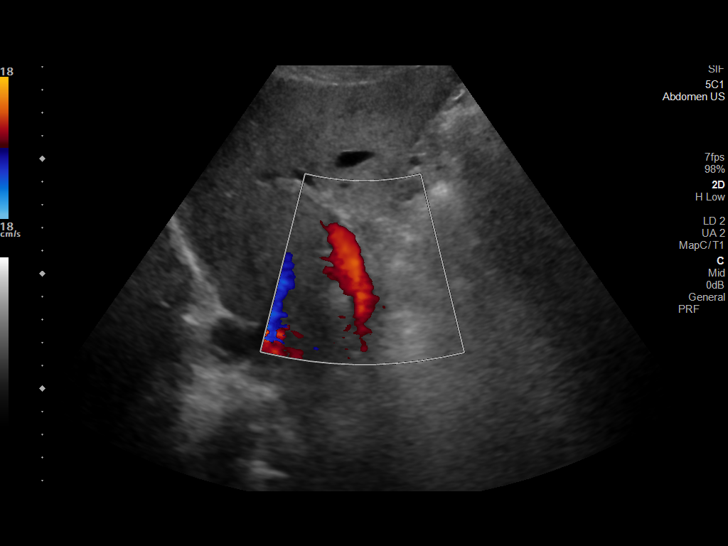
[im 60/93]
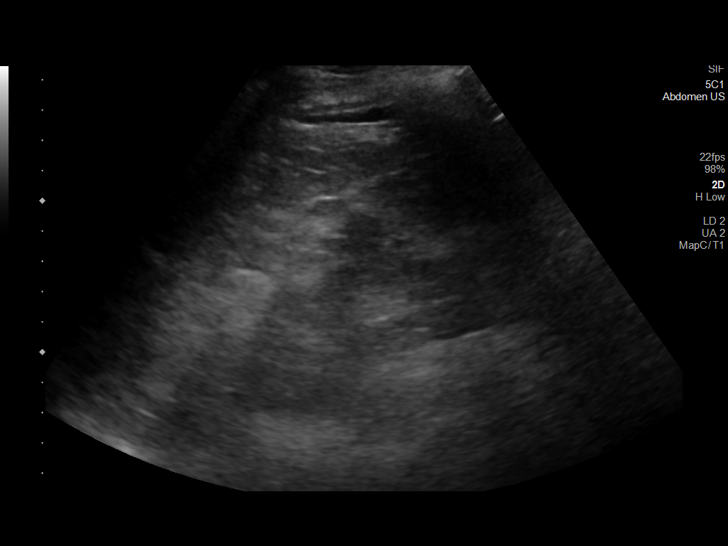
[im 71/93]
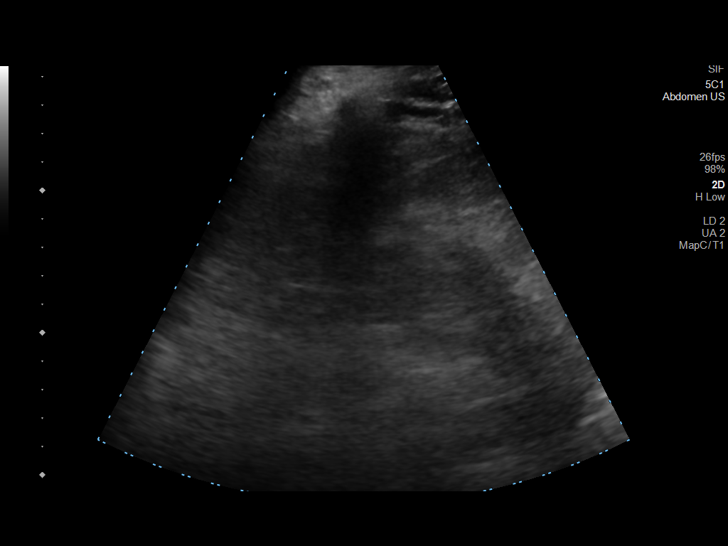
[im 82/93]
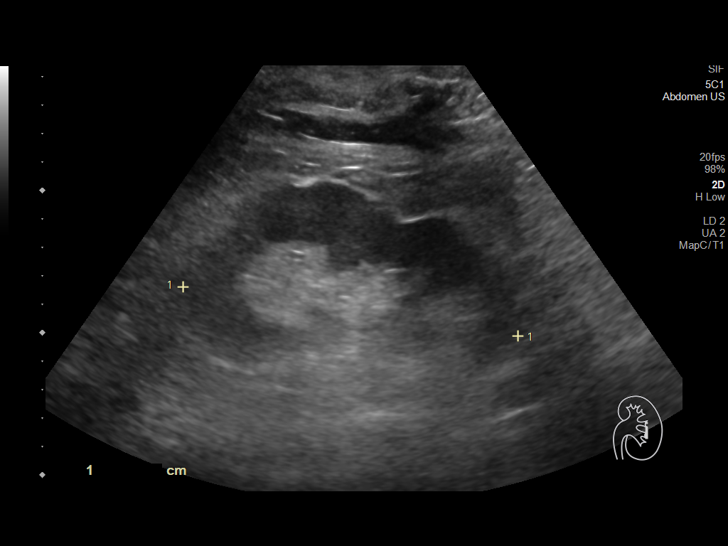
[im 93/93]
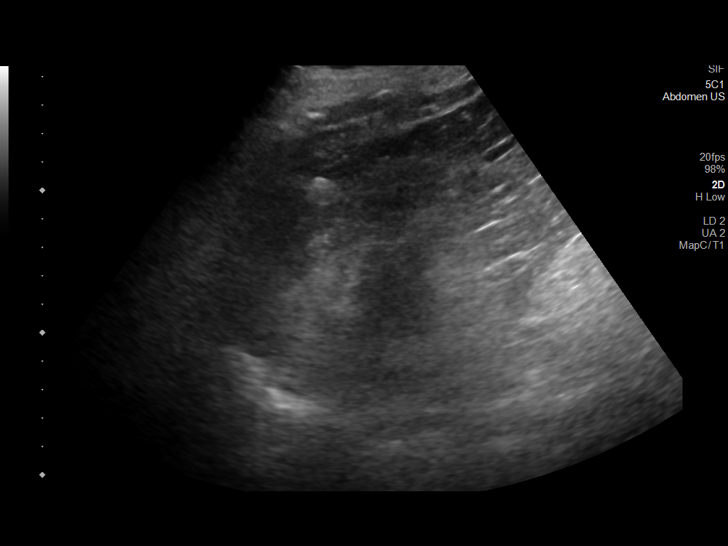

[Series 1001: abdomen us · 3 of 34 slices shown]
[im 6/34]
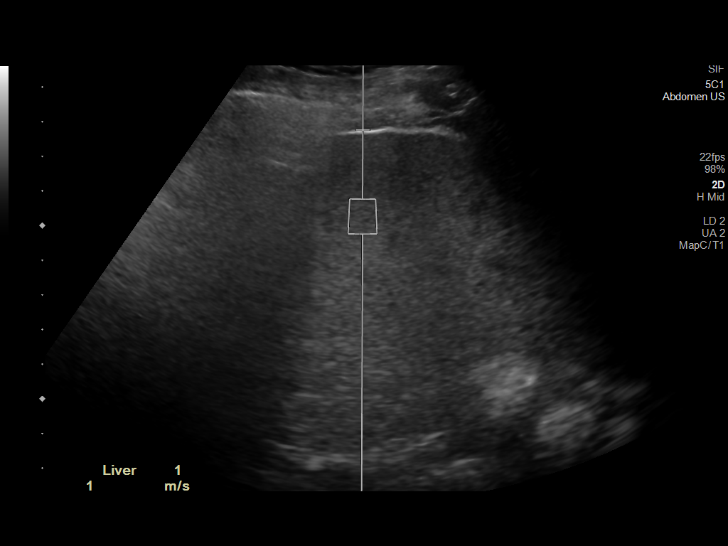
[im 17/34]
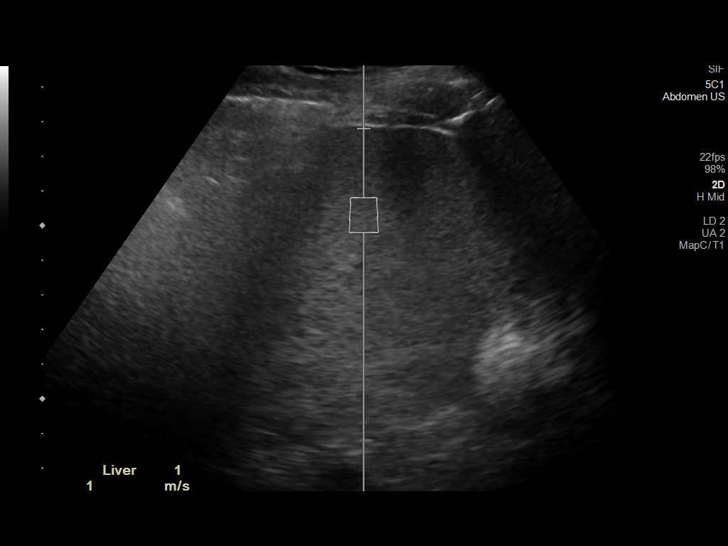
[im 28/34]
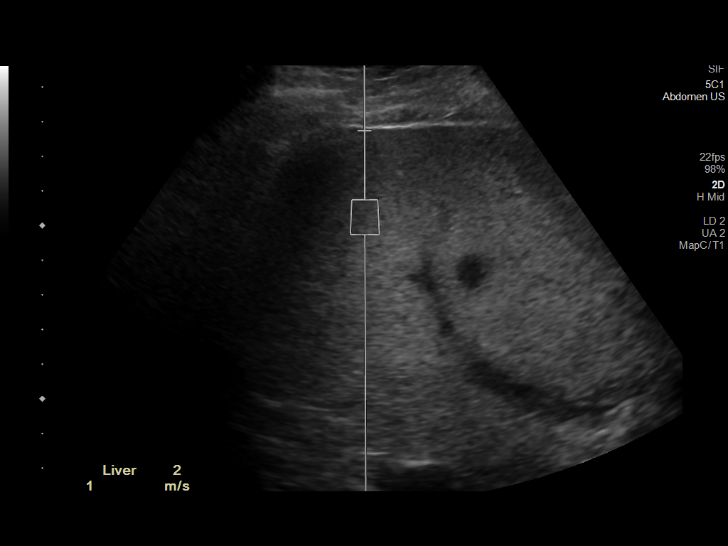

[12 of 25 positions shown; findings below may reference images not displayed]

FINDINGS: ULTRASOUND ABDOMEN

Gallbladder: Small shadowing calcification within the gallbladder
wall with twinkle artifact. Gallbladder normally distended without
stones or wall thickening. No pericholecystic fluid or sonographic
Murphy sign.

Common bile duct: Diameter: 7 mm, upper normal for age.

Liver: Echogenic parenchyma, likely fatty infiltration though this
can be seen with cirrhosis and certain infiltrative disorders. No
focal hepatic mass or nodularity. No intrahepatic biliary
dilatation. Patent with normal direction of blood flow towards the
liver

IVC: Obscured due to poor sound transmission through liver

Pancreas: Normal appearance

Spleen: Not visualized, question obscured by bowel or high subcostal
position versus surgically absent

Right Kidney: Length: 10.8 cm. Normal morphology without mass or
hydronephrosis.

Left Kidney: Length: 11.9 cm. Normal morphology without mass or
hydronephrosis.

Abdominal aorta: Obscured due to bowel gas

Other findings: No free fluid

ULTRASOUND HEPATIC ELASTOGRAPHY

Device: Siemens Helix VTQ

Patient position: Oblique

Transducer 5C1

Number of measurements: 10

Hepatic segment:  8

Median kPa:

IQR:

IQR/Median kPa ratio:

Data quality:  Good

Diagnostic category: < or = 9 kPa: in the absence of other known
clinical signs, rules out cACLD

The use of hepatic elastography is applicable to patients with viral
hepatitis and non-alcoholic fatty liver disease. At this time, there
is insufficient data for the referenced cut-off values and use in
other causes of liver disease, including alcoholic liver disease.
Patients, however, may be assessed by elastography and serve as
their own reference standard/baseline.

In patients with non-alcoholic liver disease, the values suggesting
compensated advanced chronic liver disease (cACLD) may be lower, and
patients may need additional testing with elasticity results of [DATE]
kPa.

Please note that abnormal hepatic elasticity and shear wave
velocities may also be identified in clinical settings other than
with hepatic fibrosis, such as: acute hepatitis, elevated right
heart and central venous pressures including use of beta blockers,
[place] disease ([place]), infiltrative processes such as
mastocytosis/amyloidosis/infiltrative tumor/lymphoma, extrahepatic
cholestasis, with hyperemia in the post-prandial state, and with
liver transplantation. Correlation with patient history, laboratory
data, and clinical condition recommended.

Diagnostic Categories:

< or =5 kPa: high probability of being normal

< or =9 kPa: in the absence of other known clinical signs, rules [DATE] kPa and ?13 kPa: suggestive of cACLD, but needs further testing

>13 kPa: highly suggestive of cACLD

> or =17 kPa: highly suggestive of cACLD with an increased
probability of clinically significant portal hypertension
IMPRESSION: ULTRASOUND ABDOMEN:

Echogenic hepatic parenchyma question fatty infiltration of liver.

Small gallbladder wall calcification.

Inadequate visualization of spleen, IVC, and aorta.

ULTRASOUND HEPATIC ELASTOGRAPHY:

Median kPa:

Diagnostic category: < or = 9 kPa: in the absence of other known
clinical signs, rules out cACLD; please note that in patients with
non-alcoholic liver disease, the values suggesting compensated
advanced chronic liver disease (cACLD) may be lower, and patients
may need additional testing with elasticity results of [DATE] kPa.

## 2022-07-25 ENCOUNTER — Ambulatory Visit: Payer: Medicare Other | Admitting: Physical Therapy

## 2022-07-28 ENCOUNTER — Ambulatory Visit: Payer: Medicare Other

## 2022-08-04 ENCOUNTER — Ambulatory Visit: Payer: Self-pay | Admitting: Orthopedic Surgery

## 2022-08-04 NOTE — H&P (View-Only) (Signed)
Dakota Gibbs is an 71 y.o. male.   Chief Complaint: left knee pain HPI: Patient is here for his H&P. Patient is scheduled for a left total knee replacement by Dr. Beane on 08/13/22 at [facility].  Dr. Beane and the patient mutually agreed to proceed with a total knee replacement. Risks and benefits of the procedure were discussed including stiffness, suboptimal range of motion, persistent pain, infection requiring removal of prosthesis and reinsertion, need for prophylactic antibiotics in the future, for example, dental procedures, possible need for manipulation, revision in the future and also anesthetic complications including DVT, PE, etc. We discussed the perioperative course, time in the hospital, postoperative recovery and the need for elevation to control swelling. We also discussed the predicted range of motion and the probability that squatting and kneeling would be unobtainable in the future. In addition, postoperative anticoagulation was discussed. We have obtained preoperative medical clearance as necessary. Provided illustrated handout and discussed it in detail. They will enroll in the total joint replacement educational forum at the hospital.  He has been cleared by his PCP and cardiologist.  Past Medical History:  Diagnosis Date   Allergy    enviornmental   Anxiety    Anxiety and depression 10/25/2012   Arthritis    self dx   Back pain    L4-L5 bulging disc, L5-S1 - bulging disc, pinched nerve in neck   CAD (coronary artery disease)      Dr Kelly, s/p stents----- sees cards yearly    Cancer ([facility])    basal cell; carcinoma removed x3 and SCC   Cataract    bilateral sx   Depression    Diabetes mellitus    pt denies   GERD (gastroesophageal reflux disease)    on meds   History of shingles 2009   Hyperlipidemia    on meds   Hypertension    on meds   Myocardial infarction ([facility]) 1997   caused by a blood clot   Sleep apnea    not have a CPAP as of  02/27/2020-just dx    Past Surgical History:  Procedure Laterality Date   ANGIOPLASTY  1997   BACK SURGERY  04/02/2022   basel cell     skin carcinoma removed x 4   CARDIAC CATHETERIZATION     10/04/1999   COLONOSCOPY  2018   SA-MAC-suprep(good)-TA   DOPPLER ECHOCARDIOGRAPHY  07/09/2006   EF 50 to 55 %, LA mildy dilated   HERNIA REPAIR  1951   RIGHT   KNEE ARTHROSCOPY Right 2001   R   NM MYOCAR PERF WALL MOTION  08/22/2011   Mets 13,low risk study   POLYPECTOMY  2018   TA   WRIST SURGERY  1984   right    Family History  Problem Relation Age of Onset   Stroke Mother        TIA   COPD Mother    Hypertension Sister    Prostate cancer Father 72   Coronary artery disease Father        GM   Diabetes Brother    Prostate cancer Brother 62   Diabetes Maternal Grandmother    Colon cancer Neg Hx    Esophageal cancer Neg Hx    Colon polyps Neg Hx    Rectal cancer Neg Hx    Stomach cancer Neg Hx    Social History:  reports that he quit smoking about 23 months ago. His smoking use included cigars. He has never   used smokeless tobacco. He reports current alcohol use of about 21.0 standard drinks of alcohol per week. He reports that he does not use drugs.  Allergies: No Known Allergies  Current meds: amLODIPine 5 mg tablet ascorbic acid (vitamin C) 500 mg chewable tablet aspirin 81 mg tablet,delayed release atorvastatin 40 mg tablet cetirizine 10 mg tablet citalopram 20 mg tablet ezetimibe 10 mg tablet hydroCHLOROthiazide 12.5 mg capsule HYDROcodone 5 mg-acetaminophen 325 mg tablet metFORMIN ER 500 mg tablet,extended release 24 hr metoprolol succinate ER 25 mg tablet,extended release 24 hr metoprolol tartrate 25 mg tablet nitroglycerin 0.4 mg sublingual tablet olmesartan 20 mg tablet oxymetazoline-menthol 0.05 % nasal spray turmeric Vitamin B12 Vitamin B6 Vitamin D3  Review of Systems  Constitutional: Negative.   HENT: Negative.    Eyes: Negative.   Respiratory:  Negative.    Cardiovascular: Negative.   Gastrointestinal: Negative.   Endocrine: Negative.   Genitourinary: Negative.   Musculoskeletal:  Positive for arthralgias, gait problem, joint swelling and myalgias.  Skin: Negative.     There were no vitals taken for this visit. Physical Exam Constitutional:      Appearance: Normal appearance.  HENT:     Head: Normocephalic and atraumatic.     Right Ear: External ear normal.     Left Ear: External ear normal.     Nose: Nose normal.     Mouth/Throat:     Pharynx: Oropharynx is clear.  Eyes:     Conjunctiva/sclera: Conjunctivae normal.  Cardiovascular:     Rate and Rhythm: Normal rate and regular rhythm.     Pulses: Normal pulses.  Pulmonary:     Effort: Pulmonary effort is normal.  Abdominal:     General: Bowel sounds are normal.  Musculoskeletal:     Cervical back: Normal range of motion.     Comments: Left knee tender medial joint line patellofemoral pain compression ranges -10-1 30. Varus deformity. No DVT ipsilateral hip and ankle exam is unremarkable.  Skin:    General: Skin is warm and dry.  Neurological:     Mental Status: He is alert.      Assessment/Plan Impression: Left knee end-stage osteoarthritis  Plan: Pt with end-stage left knee DJD, bone-on-bone, refractory to conservative tx, scheduled for left total knee replacement by Dr. Beane on April 10. We again discussed the procedure itself as well as risks, complications and alternatives, including but not limited to DVT, PE, infx, bleeding, failure of procedure, need for secondary procedure including manipulation, nerve injury, ongoing pain/symptoms, anesthesia risk, even stroke or death. Also discussed typical post-op protocols, activity restrictions, need for PT, flexion/extension exercises, time out of work. Discussed need for DVT ppx post-op per protocol. Discussed dental ppx and infx prevention. Also discussed limitations post-operatively such as kneeling and  squatting. All questions were answered. Patient desires to proceed with surgery as scheduled.  Will hold supplements, ASA and NSAIDs accordingly. Will remain NPO after midnight the night before surgery. Will present to [facility] for pre-op testing. Anticipate hospital stay to include at least 2 midnights given medical history and to ensure proper pain control. Plan ASA for DVT ppx post-op. Plan hydrocodone, Colace, Miralax. Plan home with HHPT post-op with family members at home for assistance. Will follow up 10-14 days post-op for staple removal and xrays.  Plan Left total knee replacement  Dessie Tatem M Ula Couvillon, PA-C for Dr Beane 08/04/2022, 10:56 AM    

## 2022-08-04 NOTE — H&P (Signed)
Dakota Gibbs is an 72 y.o. male.   Chief Complaint: left knee pain HPI: Patient is here for his H&P. Patient is scheduled for a left total knee replacement by Dr. Tonita Cong on 08/13/22 at [facility].  Dr. Tonita Cong and the patient mutually agreed to proceed with a total knee replacement. Risks and benefits of the procedure were discussed including stiffness, suboptimal range of motion, persistent pain, infection requiring removal of prosthesis and reinsertion, need for prophylactic antibiotics in the future, for example, dental procedures, possible need for manipulation, revision in the future and also anesthetic complications including DVT, PE, etc. We discussed the perioperative course, time in the hospital, postoperative recovery and the need for elevation to control swelling. We also discussed the predicted range of motion and the probability that squatting and kneeling would be unobtainable in the future. In addition, postoperative anticoagulation was discussed. We have obtained preoperative medical clearance as necessary. Provided illustrated handout and discussed it in detail. They will enroll in the total joint replacement educational forum at the hospital.  He has been cleared by his PCP and cardiologist.  Past Medical History:  Diagnosis Date   Allergy    enviornmental   Anxiety    Anxiety and depression 10/25/2012   Arthritis    self dx   Back pain    L4-L5 bulging disc, L5-S1 - bulging disc, pinched nerve in neck   CAD (coronary artery disease)      Dr Claiborne Billings, s/p stents----- sees cards yearly    Cancer [facility])    basal cell; carcinoma removed x3 and SCC   Cataract    bilateral sx   Depression    Diabetes mellitus    pt denies   GERD (gastroesophageal reflux disease)    on meds   History of shingles 2009   Hyperlipidemia    on meds   Hypertension    on meds   Myocardial infarction ([place]) 1997   caused by a blood clot   Sleep apnea    not have a CPAP as of  02/27/2020-just dx    Past Surgical History:  Procedure Laterality Date   ANGIOPLASTY  1997   BACK SURGERY  04/02/2022   basel cell     skin carcinoma removed x 4   CARDIAC CATHETERIZATION     10/04/1999   COLONOSCOPY  2018   SA-MAC-suprep(good)-TA   DOPPLER ECHOCARDIOGRAPHY  07/09/2006   EF 32 to 55 %, LA mildy dilated   HERNIA REPAIR  1951   RIGHT   KNEE ARTHROSCOPY Right 2001   R   NM MYOCAR PERF WALL MOTION  08/22/2011   Mets 13,low risk study   POLYPECTOMY  2018   TA   WRIST SURGERY  1984   right    Family History  Problem Relation Age of Onset   Stroke Mother        TIA   COPD Mother    Hypertension Sister    Prostate cancer Father 27   Coronary artery disease Father        GM   Diabetes Brother    Prostate cancer Brother 23   Diabetes Maternal Grandmother    Colon cancer Neg Hx    Esophageal cancer Neg Hx    Colon polyps Neg Hx    Rectal cancer Neg Hx    Stomach cancer Neg Hx    Social History:  reports that he quit smoking about 23 months ago. His smoking use included cigars. He has never  used smokeless tobacco. He reports current alcohol use of about 21.0 standard drinks of alcohol per week. He reports that he does not use drugs.  Allergies: No Known Allergies  Current meds: amLODIPine 5 mg tablet ascorbic acid (vitamin C) 500 mg chewable tablet aspirin 81 mg tablet,delayed release atorvastatin 40 mg tablet cetirizine 10 mg tablet citalopram 20 mg tablet ezetimibe 10 mg tablet hydroCHLOROthiazide 12.5 mg capsule HYDROcodone 5 mg-acetaminophen 325 mg tablet metFORMIN ER 500 mg tablet,extended release 24 hr metoprolol succinate ER 25 mg tablet,extended release 24 hr metoprolol tartrate 25 mg tablet nitroglycerin 0.4 mg sublingual tablet olmesartan 20 mg tablet oxymetazoline-menthol 0.05 % nasal spray turmeric Vitamin B12 Vitamin B6 Vitamin D3  Review of Systems  Constitutional: Negative.   HENT: Negative.    Eyes: Negative.   Respiratory:  Negative.    Cardiovascular: Negative.   Gastrointestinal: Negative.   Endocrine: Negative.   Genitourinary: Negative.   Musculoskeletal:  Positive for arthralgias, gait problem, joint swelling and myalgias.  Skin: Negative.     There were no vitals taken for this visit. Physical Exam Constitutional:      Appearance: Normal appearance.  HENT:     Head: Normocephalic and atraumatic.     Right Ear: External ear normal.     Left Ear: External ear normal.     Nose: Nose normal.     Mouth/Throat:     Pharynx: Oropharynx is clear.  Eyes:     Conjunctiva/sclera: Conjunctivae normal.  Cardiovascular:     Rate and Rhythm: Normal rate and regular rhythm.     Pulses: Normal pulses.  Pulmonary:     Effort: Pulmonary effort is normal.  Abdominal:     General: Bowel sounds are normal.  Musculoskeletal:     Cervical back: Normal range of motion.     Comments: Left knee tender medial joint line patellofemoral pain compression ranges -10-1 30. Varus deformity. No DVT ipsilateral hip and ankle exam is unremarkable.  Skin:    General: Skin is warm and dry.  Neurological:     Mental Status: He is alert.      Assessment/Plan Impression: Left knee end-stage osteoarthritis  Plan: Pt with end-stage left knee DJD, bone-on-bone, refractory to conservative tx, scheduled for left total knee replacement by Dr. Tonita Cong on April 10. We again discussed the procedure itself as well as risks, complications and alternatives, including but not limited to DVT, PE, infx, bleeding, failure of procedure, need for secondary procedure including manipulation, nerve injury, ongoing pain/symptoms, anesthesia risk, even stroke or death. Also discussed typical post-op protocols, activity restrictions, need for PT, flexion/extension exercises, time out of work. Discussed need for DVT ppx post-op per protocol. Discussed dental ppx and infx prevention. Also discussed limitations post-operatively such as kneeling and  squatting. All questions were answered. Patient desires to proceed with surgery as scheduled.  Will hold supplements, ASA and NSAIDs accordingly. Will remain NPO after midnight the night before surgery. Will present to [facility] for pre-op testing. Anticipate hospital stay to include at least 2 midnights given medical history and to ensure proper pain control. Plan ASA for DVT ppx post-op. Plan hydrocodone, Colace, Miralax. Plan home with HHPT post-op with family members at home for assistance. Will follow up 10-14 days post-op for staple removal and xrays.  Plan Left total knee replacement  Cecilie Kicks, PA-C for Dr Tonita Cong 08/04/2022, 10:56 AM

## 2022-08-05 NOTE — Patient Instructions (Signed)
SURGICAL WAITING ROOM VISITATION  Patients having surgery or a procedure may have no more than 2 support people in the waiting area - these visitors may rotate.    Children under the age of 103 must have an adult with them who is not the patient.  Due to an increase in RSV and influenza rates and associated hospitalizations, children ages 74 and under may not visit patients in [place].  If the patient needs to stay at the hospital during part of their recovery, the visitor guidelines for inpatient rooms apply. Pre-op nurse will coordinate an appropriate time for 1 support person to accompany patient in pre-op.  This support person may not rotate.    Please refer to the [facility] website for the visitor guidelines for Inpatients (after your surgery is over and you are in a regular room).       Your procedure is scheduled on:  08/13/2022    Report to [facility] Main Entrance    Report to admitting at  [facility] AM   Call this number if you have problems the morning of surgery [phone]   Do not eat food :After Midnight.   After Midnight you may have the following liquids until _ 0715_____ AM DAY OF SURGERY  Water Non-Citrus Juices (without pulp, NO RED-Apple, White grape, White cranberry) Black Coffee (NO MILK/CREAM OR CREAMERS, sugar ok)  Clear Tea (NO MILK/CREAM OR CREAMERS, sugar ok) regular and decaf                             Plain Jell-O (NO RED)                                           Fruit ices (not with fruit pulp, NO RED)                                     Popsicles (NO RED)                                                               Sports drinks like Gatorade (NO RED)                      The day of surgery:  Drink ONE (1) Pre-Surgery Clear Ensure or G2 at AM the morning of surgery. Drink in one sitting. Do not sip.  This drink was given to you during your hospital  pre-op appointment visit. Nothing else to drink after completing the   Pre-Surgery Clear Ensure or G2.          If you have questions, please contact your surgeon's office.       Oral Hygiene is also important to reduce your risk of infection.                                    Remember - BRUSH YOUR TEETH THE MORNING OF SURGERY WITH YOUR REGULAR TOOTHPASTE  DENTURES WILL  BE REMOVED PRIOR TO SURGERY PLEASE DO NOT APPLY "Poly grip" OR ADHESIVES!!!   Do NOT smoke after Midnight   Take these medicines the morning of surgery with A SIP OF WATER:  amlodipoine, zyzrtec, celexa, gabapentin, metoprolol if needed   DO NOT TAKE ANY ORAL DIABETIC MEDICATIONS DAY OF YOUR SURGERY  Bring CPAP mask and tubing day of surgery.                              You may not have any metal on your body including hair pins, jewelry, and body piercing             Do not wear make-up, lotions, powders, perfumes/cologne, or deodorant  Do not wear nail polish including gel and S&S, artificial/acrylic nails, or any other type of covering on natural nails including finger and toenails. If you have artificial nails, gel coating, etc. that needs to be removed by a nail salon please have this removed prior to surgery or surgery may need to be canceled/ delayed if the surgeon/ anesthesia feels like they are unable to be safely monitored.   Do not shave  48 hours prior to surgery.               Men may shave face and neck.   Do not bring valuables to the hospital. [place].   Contacts, glasses, dentures or bridgework may not be worn into surgery.   Bring small overnight bag day of surgery.   DO NOT [place]. PHARMACY WILL DISPENSE MEDICATIONS LISTED ON YOUR MEDICATION LIST TO YOU DURING YOUR ADMISSION [place]!    Patients discharged on the day of surgery will not be allowed to drive home.  Someone NEEDS to stay with you for the first 24 hours after anesthesia.   Special Instructions:  Bring a copy of your healthcare power of attorney and living will documents the day of surgery if you haven't scanned them before.              Please read over the following fact sheets you were given: IF [place] [phone]   If you received a COVID test during your pre-op visit  it is requested that you wear a mask when out in public, stay away from anyone that may not be feeling well and notify your surgeon if you develop symptoms. If you test positive for Covid or have been in contact with anyone that has tested positive in the last 10 days please notify you surgeon.

## 2022-08-06 ENCOUNTER — Encounter: Payer: Self-pay | Admitting: Family Medicine

## 2022-08-06 ENCOUNTER — Encounter (HOSPITAL_COMMUNITY)
Admission: RE | Admit: 2022-08-06 | Discharge: 2022-08-06 | Disposition: A | Discharge status: Home / Self Care | Payer: Medicare Other | Source: Ambulatory Visit | Attending: Specialist | Admitting: Specialist

## 2022-08-06 ENCOUNTER — Encounter (HOSPITAL_COMMUNITY): Payer: Self-pay

## 2022-08-06 ENCOUNTER — Other Ambulatory Visit: Payer: Self-pay | Admitting: Family Medicine

## 2022-08-06 ENCOUNTER — Other Ambulatory Visit: Payer: Self-pay

## 2022-08-06 VITALS — BP 132/62 | HR 52 | Temp 98.6°F | Resp 16 | Ht 69.5 in | Wt 206.0 lb

## 2022-08-06 DIAGNOSIS — E119 Type 2 diabetes mellitus without complications: Secondary | ICD-10-CM | POA: Diagnosis not present

## 2022-08-06 DIAGNOSIS — I252 Old myocardial infarction: Secondary | ICD-10-CM | POA: Diagnosis not present

## 2022-08-06 DIAGNOSIS — I77819 Aortic ectasia, unspecified site: Secondary | ICD-10-CM | POA: Insufficient documentation

## 2022-08-06 DIAGNOSIS — Z01818 Encounter for other preprocedural examination: Secondary | ICD-10-CM | POA: Diagnosis not present

## 2022-08-06 DIAGNOSIS — I10 Essential (primary) hypertension: Secondary | ICD-10-CM | POA: Insufficient documentation

## 2022-08-06 DIAGNOSIS — Z7984 Long term (current) use of oral hypoglycemic drugs: Secondary | ICD-10-CM | POA: Insufficient documentation

## 2022-08-06 DIAGNOSIS — I2510 Atherosclerotic heart disease of native coronary artery without angina pectoris: Secondary | ICD-10-CM | POA: Insufficient documentation

## 2022-08-06 DIAGNOSIS — F32A Depression, unspecified: Secondary | ICD-10-CM

## 2022-08-06 DIAGNOSIS — M1712 Unilateral primary osteoarthritis, left knee: Secondary | ICD-10-CM | POA: Insufficient documentation

## 2022-08-06 DIAGNOSIS — G4733 Obstructive sleep apnea (adult) (pediatric): Secondary | ICD-10-CM | POA: Insufficient documentation

## 2022-08-06 HISTORY — DX: Atherosclerotic heart disease of native coronary artery without angina pectoris: I25.10

## 2022-08-06 LAB — CBC
HCT: 40.6 % (ref 39.0–52.0)
Hemoglobin: 13.1 g/dL (ref 13.0–17.0)
MCH: 30.8 pg (ref 26.0–34.0)
MCHC: 32.3 g/dL (ref 30.0–36.0)
MCV: 95.5 fL (ref 80.0–100.0)
Platelets: 223 10*3/uL (ref 150–400)
RBC: 4.25 MIL/uL (ref 4.22–5.81)
RDW: 13.3 % (ref 11.5–15.5)
WBC: 6.3 10*3/uL (ref 4.0–10.5)
nRBC: 0 % (ref 0.0–0.2)

## 2022-08-06 LAB — BASIC METABOLIC PANEL
Anion gap: 9 (ref 5–15)
BUN: 16 mg/dL (ref 8–23)
CO2: 24 mmol/L (ref 22–32)
Calcium: 9 mg/dL (ref 8.9–10.3)
Chloride: 103 mmol/L (ref 98–111)
Creatinine, Ser: 0.97 mg/dL (ref 0.61–1.24)
GFR, Estimated: >60 mL/min (ref >60)
Glucose, Bld: 131 mg/dL — ABNORMAL HIGH (ref 70–99)
Potassium: 4.6 mmol/L (ref 3.5–5.1)
Sodium: 136 mmol/L (ref 135–145)

## 2022-08-06 LAB — SURGICAL PCR SCREEN
MRSA, PCR: NEGATIVE
Staphylococcus aureus: POSITIVE — AB

## 2022-08-06 LAB — GLUCOSE, CAPILLARY: Glucose-Capillary: 124 mg/dL — ABNORMAL HIGH (ref 70–99)

## 2022-08-06 NOTE — Progress Notes (Signed)
Case: [id number] Date/Time: 08/13/22 1008   Procedure: TOTAL KNEE ARTHROPLASTY (Left: Knee)   Anesthesia type: Spinal   Pre-op diagnosis: Left knee degenerative joint disease   Location: [facility] ROOM 10 / [facility]   Surgeons: Jene Every, MD       DISCUSSION: Dakota Gibbs is a 72 year old former smoker who presents for surgery listed above with Dr. Shelle Iron on 08/13/22. PMH significant for CAD, MI, HTN, OSA, DM. Of note patient had recent back surgery in Dec 2023 and did not have any reported anesthesia complications.  #CAD #Hx of inferior MI with PCI to RCA #HTN #Dilation of aortic root to 58mm -Followed by Cardiology, last seen 02/25/22 and has been stable. -BP stable  #Perioperative clearance -Had telephone visit 07/10/22:  "The patient is doing well from a cardiac perspective. Therefore, based on ACC/AHA guidelines, the patient would be at acceptable risk for the planned procedure without further cardiovascular testing. According to the Revised Cardiac Risk Index (RCRI), his Perioperative Risk of Major Cardiac Event is (%): 11. His Functional Capacity in METs is: 6.61 according to the [facility] Activity Status Index (DASI).   The patient was advised that if he develops new symptoms prior to surgery to contact our office to arrange for a follow-up visit, and he verbalized understanding.   Per office protocol, if patient is without any new symptoms or concerns at the time of their virtual visit, he may hold Aspirin for 5-7 days prior to procedure. Please resume Aspirin as soon as possible postprocedure, at the discretion of the surgeon."   #OSA -Uses CPAP  #DM HgA1c 6.5 on 4/3  VS: BP 132/62   Pulse (!) 52   Temp 37 C (Oral)   Resp 16   Ht 5' 9.5" (1.765 m)   Wt 93.4 kg   SpO2 95%   BMI 29.98 kg/m   PROVIDERS: PCP: Mliss Sax, MD Cardiologist: Dr. Nicki Gu[place]ara   LABS: Labs reviewed: Acceptable for surgery. (all labs ordered are listed, but only abnormal results  are displayed)  Labs Reviewed  SURGICAL PCR SCREEN - Abnormal; Notable for the following components:      Result Value   Staphylococcus aureus POSITIVE (*)    All other components within normal limits  BASIC METABOLIC PANEL - Abnormal; Notable for the following components:   Glucose, Bld 131 (*)    All other components within normal limits  GLUCOSE, CAPILLARY - Abnormal; Notable for the following components:   Glucose-Capillary 124 (*)    All other components within normal limits  CBC  HEMOGLOBIN A1C     IMAGES:  CT abdomen/pelvis w contrast 06/17/22:  IMPRESSION: 1. No abnormality to explain reported bloating. 2. Hepatic steatosis. 3. Cardiomegaly with scattered coronary artery calcifications. 4. Aortic atherosclerosis.   EKG 02/25/22: Sinus bradycardia HR 47 with baseline artifact    Echo 01/10/20:  1. Left ventricular ejection fraction, by estimation, is 55 to 60%. The  left ventricle has normal function. The left ventricle has no regional  wall motion abnormalities. Left ventricular diastolic parameters are  consistent with Grade I diastolic  dysfunction (impaired relaxation).   2. Right ventricular systolic function is normal. The right ventricular  size is normal. There is normal pulmonary artery systolic pressure. The  estimated right ventricular systolic pressure is 29.2 mmHg.   3. Left atrial size was mildly dilated.   4. Right atrial size was mildly dilated.   5. The mitral valve is normal in structure. No evidence of mitral  valve  regurgitation. No evidence of mitral stenosis.   6. The aortic valve is tricuspid. Aortic valve regurgitation is trivial.  No aortic stenosis is present.   7. Aortic dilatation noted. There is mild dilatation of the aortic root  measuring 40 mm.   8. The inferior vena cava is normal in size with greater than 50%  respiratory variability, suggesting right atrial pressure of 3 mmHg.   GATED SPECT MYO PERF W/LEXISCAN STRESS 1D  01/27/19:  The left ventricular ejection fraction is mildly decreased (45-54%). Nuclear stress EF: 50%. Inferior wall hypokinesis There was no ST segment deviation noted during stress. Defect 1: There is a medium defect of severe severity present in the basal inferior, mid inferior and apical inferior location.  Findings consistent with prior inferior myocardial infarction. This is an overall low risk study. No ischemia.   Past Medical History:  Diagnosis Date   Allergy    enviornmental   Anxiety    Anxiety and depression 10/25/2012   Arthritis    self dx   Back pain    L4-L5 bulging disc, L5-S1 - bulging disc, pinched nerve in neck   Cataract    bilateral sx   Coronary artery disease    hx of balloon angioplasty with DR Bishop [place]   Depression    Diabetes mellitus    pt denies   History of shingles 2009   Hyperlipidemia    on meds   Hypertension    on meds   Myocardial infarction 1997   caused by a blood clot   Sleep apnea    cpap    Past Surgical History:  Procedure Laterality Date   ANGIOPLASTY  1997   BACK SURGERY  04/02/2022   basel cell     skin carcinoma removed x 4   BILATERAL CARPAL TUNNEL RELEASE     CARDIAC CATHETERIZATION     10/04/1999   COLONOSCOPY  2018   SA-MAC-suprep(good)-TA   DOPPLER ECHOCARDIOGRAPHY  07/09/2006   EF 50 to 55 %, LA mildy dilated   HERNIA REPAIR  1951   RIGHT   KNEE ARTHROSCOPY Right 2001   R   NM MYOCAR PERF WALL MOTION  08/22/2011   Mets 13,low risk study   POLYPECTOMY  2018   TA   WRIST SURGERY  1984   right    MEDICATIONS:  acetaminophen (TYLENOL) 650 MG CR tablet   amLODipine (NORVASC) 5 MG tablet   aspirin EC 81 MG tablet   atorvastatin (LIPITOR) 40 MG tablet   cetirizine (ZYRTEC) 10 MG tablet   Cholecalciferol (VITAMIN D) 50 MCG (2000 UT) tablet   citalopram (CELEXA) 20 MG tablet   cyanocobalamin (VITAMIN B12) 1000 MCG tablet   ezetimibe (ZETIA) 10 MG tablet   fluticasone (FLONASE) 50 MCG/ACT nasal spray    hydrochlorothiazide (MICROZIDE) 12.5 MG capsule   ibuprofen (ADVIL) 200 MG tablet   metFORMIN (GLUCOPHAGE-XR) 500 MG 24 hr tablet   metoprolol tartrate (LOPRESSOR) 25 MG tablet   Multiple Vitamin (MULTIVITAMIN) tablet   naproxen sodium (ALEVE) 220 MG tablet   nitroGLYCERIN (NITROSTAT) 0.4 MG SL tablet   olmesartan (BENICAR) 20 MG tablet   omeprazole (PRILOSEC OTC) 20 MG tablet   oxymetazoline (AFRIN) 0.05 % nasal spray   pyridOXINE (VITAMIN B6) 100 MG tablet   tadalafil (CIALIS) 20 MG tablet   trolamine salicylate (ASPERCREME) 10 % cream   vitamin C (ASCORBIC ACID) 500 MG tablet   vitamin E 400 UNIT capsule   No current facility-administered  medications for this encounter.   Eulah Citizen [facility] Surgical Short Stay/Anesthesiology [facility] Phone [phone]  08/06/2022 1:52 PM

## 2022-08-06 NOTE — Progress Notes (Addendum)
Anesthesia Review:  PCP: Nadene Rubins- LVO 06/23/22  Cardiologist :  DR Karie Schwalbe Tresa Endo  LOV 11/30/20 Lebron Conners telephone visit on 07/10/22  Chest x-ray : EKG : 02/25/22  Echo : 2021  Stress test: 2020  Cardiac Cath :  2001  Activity level: can do ao flgiht of stairs without difficutly  Sleep Study/ CPAP : to bring mask and tubing  Fasting Blood Sugar :      / Checks Blood Sugar -- times a day:   Blood Thinner/ Instructions /Last Dose: ASA / Instructions/ Last Dose :    81 mg aspirin    DM- type 2- does not check glucose at home  Hgba1c- 08/06/22-  6.5    Called pharmacy to reconcile meds  at preop .  NO answer.  PT given phone number of [phone] to reconcile meds.  PT voiced understanding

## 2022-08-07 LAB — HEMOGLOBIN A1C
Hgb A1c MFr Bld: 6.5 % — ABNORMAL HIGH (ref 4.8–5.6)
Mean Plasma Glucose: 140 mg/dL

## 2022-08-13 ENCOUNTER — Ambulatory Visit (HOSPITAL_COMMUNITY): Payer: Medicare Other | Admitting: Medical

## 2022-08-13 ENCOUNTER — Observation Stay (HOSPITAL_COMMUNITY)
Admission: RE | Admit: 2022-08-13 | Discharge: 2022-08-14 | Disposition: A | Discharge status: Home Health | Payer: Medicare Other | Source: Ambulatory Visit | Attending: Specialist | Admitting: Specialist

## 2022-08-13 ENCOUNTER — Other Ambulatory Visit: Payer: Self-pay

## 2022-08-13 ENCOUNTER — Encounter (HOSPITAL_COMMUNITY)
Admission: RE | Disposition: A | Discharge status: Home Health | Payer: Self-pay | Source: Ambulatory Visit | Attending: Specialist

## 2022-08-13 ENCOUNTER — Encounter (HOSPITAL_COMMUNITY): Payer: Self-pay | Admitting: Specialist

## 2022-08-13 ENCOUNTER — Ambulatory Visit (HOSPITAL_BASED_OUTPATIENT_CLINIC_OR_DEPARTMENT_OTHER): Payer: Medicare Other | Admitting: Anesthesiology

## 2022-08-13 DIAGNOSIS — Z87891 Personal history of nicotine dependence: Secondary | ICD-10-CM

## 2022-08-13 DIAGNOSIS — Z96652 Presence of left artificial knee joint: Secondary | ICD-10-CM

## 2022-08-13 DIAGNOSIS — Z85828 Personal history of other malignant neoplasm of skin: Secondary | ICD-10-CM | POA: Diagnosis not present

## 2022-08-13 DIAGNOSIS — I10 Essential (primary) hypertension: Secondary | ICD-10-CM | POA: Insufficient documentation

## 2022-08-13 DIAGNOSIS — Z01818 Encounter for other preprocedural examination: Secondary | ICD-10-CM

## 2022-08-13 DIAGNOSIS — Z79899 Other long term (current) drug therapy: Secondary | ICD-10-CM | POA: Insufficient documentation

## 2022-08-13 DIAGNOSIS — M21162 Varus deformity, not elsewhere classified, left knee: Secondary | ICD-10-CM | POA: Diagnosis not present

## 2022-08-13 DIAGNOSIS — G8918 Other acute postprocedural pain: Secondary | ICD-10-CM | POA: Diagnosis not present

## 2022-08-13 DIAGNOSIS — M1712 Unilateral primary osteoarthritis, left knee: Secondary | ICD-10-CM | POA: Diagnosis not present

## 2022-08-13 DIAGNOSIS — I2510 Atherosclerotic heart disease of native coronary artery without angina pectoris: Secondary | ICD-10-CM

## 2022-08-13 DIAGNOSIS — E119 Type 2 diabetes mellitus without complications: Secondary | ICD-10-CM | POA: Diagnosis not present

## 2022-08-13 HISTORY — PX: TOTAL KNEE ARTHROPLASTY: SHX125

## 2022-08-13 LAB — GLUCOSE, CAPILLARY
Glucose-Capillary: 120 mg/dL — ABNORMAL HIGH (ref 70–99)
Glucose-Capillary: 141 mg/dL — ABNORMAL HIGH (ref 70–99)
Glucose-Capillary: 150 mg/dL — ABNORMAL HIGH (ref 70–99)
Glucose-Capillary: 131 mg/dL — ABNORMAL HIGH (ref 70–99)

## 2022-08-13 SURGERY — ARTHROPLASTY, KNEE, TOTAL
Anesthesia: Monitor Anesthesia Care | Site: Knee | Laterality: Left

## 2022-08-13 MED ORDER — BUPIVACAINE-EPINEPHRINE (PF) 0.5% -1:200000 IJ SOLN
INTRAMUSCULAR | Status: DC | PRN
  Administered 2022-08-13: 20 mL

## 2022-08-13 MED ORDER — ONDANSETRON HCL 4 MG/2ML IJ SOLN
INTRAMUSCULAR | Status: AC
  Filled 2022-08-13: qty 2

## 2022-08-13 MED ORDER — HYDROCODONE-ACETAMINOPHEN 5-325 MG PO TABS
1.0000 | ORAL_TABLET | ORAL | Status: DC | PRN
  Administered 2022-08-13: 1 via ORAL
  Administered 2022-08-13 – 2022-08-14 (×4): 2 via ORAL
  Filled 2022-08-13 (×3): qty 2
  Filled 2022-08-13: qty 1
  Filled 2022-08-13: qty 2

## 2022-08-13 MED ORDER — GLYCOPYRROLATE 0.2 MG/ML IJ SOLN
INTRAMUSCULAR | Status: AC
  Filled 2022-08-13: qty 1

## 2022-08-13 MED ORDER — PROPOFOL 1000 MG/100ML IV EMUL
INTRAVENOUS | Status: AC
  Filled 2022-08-13: qty 100

## 2022-08-13 MED ORDER — HYDROMORPHONE HCL 1 MG/ML IJ SOLN: 0.2500 mg | INTRAMUSCULAR | Status: DC | PRN

## 2022-08-13 MED ORDER — FENTANYL CITRATE (PF) 250 MCG/5ML IJ SOLN
INTRAMUSCULAR | Status: DC | PRN
  Administered 2022-08-13 (×2): 25 ug via EPIDURAL
  Administered 2022-08-13: 100 ug via EPIDURAL
  Administered 2022-08-13 (×4): 25 ug via EPIDURAL

## 2022-08-13 MED ORDER — BUPIVACAINE LIPOSOME 1.3 % IJ SUSP
INTRAMUSCULAR | Status: AC
  Filled 2022-08-13: qty 20

## 2022-08-13 MED ORDER — GLYCOPYRROLATE 0.2 MG/ML IJ SOLN
INTRAMUSCULAR | Status: DC | PRN
  Administered 2022-08-13: .2 mg via INTRAVENOUS

## 2022-08-13 MED ORDER — OXYCODONE HCL 5 MG PO TABS: 5.0000 mg | ORAL_TABLET | Freq: Once | ORAL | Status: DC | PRN

## 2022-08-13 MED ORDER — FENTANYL CITRATE (PF) 250 MCG/5ML IJ SOLN
INTRAMUSCULAR | Status: DC | PRN
  Administered 2022-08-13 (×2): 50 ug via INTRAVENOUS

## 2022-08-13 MED ORDER — FENTANYL CITRATE (PF) 250 MCG/5ML IJ SOLN
INTRAMUSCULAR | Status: AC
  Filled 2022-08-13: qty 5

## 2022-08-13 MED ORDER — ONDANSETRON HCL 4 MG/2ML IJ SOLN: 4.0000 mg | Freq: Four times a day (QID) | INTRAMUSCULAR | Status: DC | PRN

## 2022-08-13 MED ORDER — SODIUM CHLORIDE (PF) 0.9 % IJ SOLN
INTRAMUSCULAR | Status: DC | PRN
  Administered 2022-08-13: 60 mL

## 2022-08-13 MED ORDER — FENTANYL CITRATE PF 50 MCG/ML IJ SOSY
50.0000 ug | PREFILLED_SYRINGE | INTRAMUSCULAR | Status: AC
  Administered 2022-08-13: 100 ug via INTRAVENOUS
  Filled 2022-08-13: qty 2

## 2022-08-13 MED ORDER — CEFAZOLIN SODIUM-DEXTROSE 2-4 GM/100ML-% IV SOLN
2.0000 g | INTRAVENOUS | Status: AC
  Administered 2022-08-13: 2 g via INTRAVENOUS
  Filled 2022-08-13: qty 100

## 2022-08-13 MED ORDER — MIDAZOLAM HCL 2 MG/2ML IJ SOLN
1.0000 mg | INTRAMUSCULAR | Status: AC
  Administered 2022-08-13: 2 mg via INTRAVENOUS
  Filled 2022-08-13: qty 2

## 2022-08-13 MED ORDER — POLYETHYLENE GLYCOL 3350 17 G PO PACK: 17.0000 g | PACK | Freq: Every day | ORAL | Status: DC | PRN

## 2022-08-13 MED ORDER — ORAL CARE MOUTH RINSE: 15.0000 mL | Freq: Once | OROMUCOSAL | Status: AC

## 2022-08-13 MED ORDER — LIDOCAINE HCL (PF) 2 % IJ SOLN
INTRAMUSCULAR | Status: AC
  Filled 2022-08-13: qty 5

## 2022-08-13 MED ORDER — ROPIVACAINE HCL 5 MG/ML IJ SOLN
INTRAMUSCULAR | Status: DC | PRN
  Administered 2022-08-13: 20 mL via PERINEURAL

## 2022-08-13 MED ORDER — TRANEXAMIC ACID-NACL 1000-0.7 MG/100ML-% IV SOLN
1000.0000 mg | INTRAVENOUS | Status: AC
  Administered 2022-08-13: 1000 mg via INTRAVENOUS
  Filled 2022-08-13: qty 100

## 2022-08-13 MED ORDER — ASPIRIN 81 MG PO CHEW
81.0000 mg | CHEWABLE_TABLET | Freq: Two times a day (BID) | ORAL | Status: DC
  Administered 2022-08-13 – 2022-08-14 (×2): 81 mg via ORAL
  Filled 2022-08-13 (×2): qty 1

## 2022-08-13 MED ORDER — ATROPINE SULFATE 0.4 MG/ML IV SOLN
INTRAVENOUS | Status: DC | PRN
  Administered 2022-08-13: .2 mg via INTRAVENOUS

## 2022-08-13 MED ORDER — METOCLOPRAMIDE HCL 5 MG PO TABS: 5.0000 mg | ORAL_TABLET | Freq: Three times a day (TID) | ORAL | Status: DC | PRN

## 2022-08-13 MED ORDER — OMEPRAZOLE MAGNESIUM 20 MG PO TBEC: 20.0000 mg | DELAYED_RELEASE_TABLET | Freq: Every day | ORAL | Status: DC

## 2022-08-13 MED ORDER — 0.9 % SODIUM CHLORIDE (POUR BTL) OPTIME
TOPICAL | Status: DC | PRN
  Administered 2022-08-13: 1000 mL

## 2022-08-13 MED ORDER — PHENOL 1.4 % MT LIQD: 1.0000 | OROMUCOSAL | Status: DC | PRN

## 2022-08-13 MED ORDER — LIDOCAINE 2% (20 MG/ML) 5 ML SYRINGE
INTRAMUSCULAR | Status: DC | PRN
  Administered 2022-08-13: 40 mg via INTRAVENOUS

## 2022-08-13 MED ORDER — PHENYLEPHRINE 80 MCG/ML (10ML) SYRINGE FOR IV PUSH (FOR BLOOD PRESSURE SUPPORT)
PREFILLED_SYRINGE | INTRAVENOUS | Status: DC | PRN
  Administered 2022-08-13: 80 ug via INTRAVENOUS

## 2022-08-13 MED ORDER — CEFAZOLIN SODIUM-DEXTROSE 2-4 GM/100ML-% IV SOLN
2.0000 g | Freq: Four times a day (QID) | INTRAVENOUS | Status: AC
  Administered 2022-08-13 – 2022-08-14 (×2): 2 g via INTRAVENOUS
  Filled 2022-08-13 (×2): qty 100

## 2022-08-13 MED ORDER — ACETAMINOPHEN 500 MG PO TABS
500.0000 mg | ORAL_TABLET | Freq: Four times a day (QID) | ORAL | Status: DC
  Administered 2022-08-14 (×2): 500 mg via ORAL
  Filled 2022-08-13 (×2): qty 1

## 2022-08-13 MED ORDER — MENTHOL 3 MG MT LOZG: 1.0000 | LOZENGE | OROMUCOSAL | Status: DC | PRN

## 2022-08-13 MED ORDER — RISAQUAD PO CAPS
1.0000 | ORAL_CAPSULE | Freq: Every day | ORAL | Status: DC
  Administered 2022-08-14: 1 via ORAL
  Filled 2022-08-13: qty 1

## 2022-08-13 MED ORDER — ACETAMINOPHEN 325 MG PO TABS: 325.0000 mg | ORAL_TABLET | Freq: Four times a day (QID) | ORAL | Status: DC | PRN

## 2022-08-13 MED ORDER — CHLORHEXIDINE GLUCONATE 0.12 % MT SOLN
15.0000 mL | Freq: Once | OROMUCOSAL | Status: AC
  Administered 2022-08-13: 15 mL via OROMUCOSAL

## 2022-08-13 MED ORDER — ONDANSETRON HCL 4 MG/2ML IJ SOLN
INTRAMUSCULAR | Status: DC | PRN
  Administered 2022-08-13: 4 mg via INTRAVENOUS

## 2022-08-13 MED ORDER — HYDROCODONE-ACETAMINOPHEN 7.5-325 MG PO TABS
1.0000 | ORAL_TABLET | ORAL | Status: DC | PRN
  Administered 2022-08-14: 1 via ORAL
  Filled 2022-08-13: qty 2

## 2022-08-13 MED ORDER — EPHEDRINE SULFATE-NACL 50-0.9 MG/10ML-% IV SOSY
PREFILLED_SYRINGE | INTRAVENOUS | Status: DC | PRN
  Administered 2022-08-13 (×3): 10 mg via INTRAVENOUS
  Administered 2022-08-13 (×3): 5 mg via INTRAVENOUS

## 2022-08-13 MED ORDER — PROMETHAZINE HCL 25 MG/ML IJ SOLN: 6.2500 mg | INTRAMUSCULAR | Status: DC | PRN

## 2022-08-13 MED ORDER — BUPIVACAINE-EPINEPHRINE (PF) 0.5% -1:200000 IJ SOLN
INTRAMUSCULAR | Status: AC
  Filled 2022-08-13: qty 30

## 2022-08-13 MED ORDER — METOPROLOL TARTRATE 25 MG PO TABS
25.0000 mg | ORAL_TABLET | Freq: Every day | ORAL | Status: DC
  Administered 2022-08-13: 25 mg via ORAL
  Filled 2022-08-13: qty 1

## 2022-08-13 MED ORDER — ASPIRIN 81 MG PO TBEC: 81.0000 mg | DELAYED_RELEASE_TABLET | Freq: Two times a day (BID) | ORAL | 1 refills | Status: DC

## 2022-08-13 MED ORDER — PROPOFOL 10 MG/ML IV BOLUS
INTRAVENOUS | Status: DC | PRN
  Administered 2022-08-13: 200 mg via INTRAVENOUS

## 2022-08-13 MED ORDER — FLUTICASONE PROPIONATE 50 MCG/ACT NA SUSP
1.0000 | Freq: Every day | NASAL | Status: DC
  Administered 2022-08-14: 1 via NASAL
  Filled 2022-08-13: qty 16

## 2022-08-13 MED ORDER — PHENYLEPHRINE 80 MCG/ML (10ML) SYRINGE FOR IV PUSH (FOR BLOOD PRESSURE SUPPORT)
PREFILLED_SYRINGE | INTRAVENOUS | Status: AC
  Filled 2022-08-13: qty 10

## 2022-08-13 MED ORDER — SODIUM CHLORIDE 0.9 % IR SOLN
Status: DC | PRN
  Administered 2022-08-13: 3000 mL

## 2022-08-13 MED ORDER — CITALOPRAM HYDROBROMIDE 20 MG PO TABS
30.0000 mg | ORAL_TABLET | Freq: Every day | ORAL | Status: DC
  Administered 2022-08-13 – 2022-08-14 (×2): 30 mg via ORAL
  Filled 2022-08-13 (×2): qty 2

## 2022-08-13 MED ORDER — PANTOPRAZOLE SODIUM 40 MG PO TBEC
40.0000 mg | DELAYED_RELEASE_TABLET | Freq: Every day | ORAL | Status: DC
  Administered 2022-08-13: 40 mg via ORAL
  Filled 2022-08-13: qty 1

## 2022-08-13 MED ORDER — ACETAMINOPHEN 10 MG/ML IV SOLN
1000.0000 mg | INTRAVENOUS | Status: AC
  Administered 2022-08-13: 1000 mg via INTRAVENOUS
  Filled 2022-08-13: qty 100

## 2022-08-13 MED ORDER — METOCLOPRAMIDE HCL 5 MG/ML IJ SOLN: 5.0000 mg | Freq: Three times a day (TID) | INTRAMUSCULAR | Status: DC | PRN

## 2022-08-13 MED ORDER — EPHEDRINE 5 MG/ML INJ
INTRAVENOUS | Status: AC
  Filled 2022-08-13: qty 10

## 2022-08-13 MED ORDER — HYDROCODONE-ACETAMINOPHEN 10-325 MG PO TABS: 1.0000 | ORAL_TABLET | ORAL | 0 refills | Status: AC | PRN

## 2022-08-13 MED ORDER — DEXAMETHASONE SODIUM PHOSPHATE 10 MG/ML IJ SOLN
INTRAMUSCULAR | Status: DC | PRN
  Administered 2022-08-13: 10 mg via INTRAVENOUS

## 2022-08-13 MED ORDER — SODIUM CHLORIDE 0.9 % IV SOLN: INTRAVENOUS | Status: DC | PRN

## 2022-08-13 MED ORDER — LACTATED RINGERS IV SOLN: INTRAVENOUS | Status: DC

## 2022-08-13 MED ORDER — BISACODYL 5 MG PO TBEC: 5.0000 mg | DELAYED_RELEASE_TABLET | Freq: Every day | ORAL | Status: DC | PRN

## 2022-08-13 MED ORDER — POLYETHYLENE GLYCOL 3350 17 G PO PACK: 17.0000 g | PACK | Freq: Every day | ORAL | 0 refills | Status: DC

## 2022-08-13 MED ORDER — INSULIN ASPART 100 UNIT/ML IJ SOLN
0.0000 [IU] | Freq: Three times a day (TID) | INTRAMUSCULAR | Status: DC
  Administered 2022-08-13: 2 [IU] via SUBCUTANEOUS
  Administered 2022-08-14 (×2): 3 [IU] via SUBCUTANEOUS

## 2022-08-13 MED ORDER — PRONTOSAN WOUND IRRIGATION OPTIME
TOPICAL | Status: DC | PRN
  Administered 2022-08-13: 1

## 2022-08-13 MED ORDER — HYDROCHLOROTHIAZIDE 12.5 MG PO TABS
12.5000 mg | ORAL_TABLET | Freq: Every day | ORAL | Status: DC
  Administered 2022-08-14: 12.5 mg via ORAL
  Filled 2022-08-13: qty 1

## 2022-08-13 MED ORDER — STERILE WATER FOR IRRIGATION IR SOLN
Status: DC | PRN
  Administered 2022-08-13: 2000 mL

## 2022-08-13 MED ORDER — METHOCARBAMOL 500 MG PO TABS: 500.0000 mg | ORAL_TABLET | Freq: Three times a day (TID) | ORAL | 1 refills | Status: DC | PRN

## 2022-08-13 MED ORDER — FENTANYL CITRATE (PF) 100 MCG/2ML IJ SOLN
INTRAMUSCULAR | Status: AC
  Filled 2022-08-13: qty 2

## 2022-08-13 MED ORDER — DOCUSATE SODIUM 100 MG PO CAPS: 100.0000 mg | ORAL_CAPSULE | Freq: Two times a day (BID) | ORAL | 1 refills | Status: DC | PRN

## 2022-08-13 MED ORDER — OXYCODONE HCL 5 MG/5ML PO SOLN: 5.0000 mg | Freq: Once | ORAL | Status: DC | PRN

## 2022-08-13 MED ORDER — ATROPINE SULFATE 0.4 MG/ML IV SOLN
INTRAVENOUS | Status: AC
  Filled 2022-08-13: qty 1

## 2022-08-13 MED ORDER — ONDANSETRON HCL 4 MG PO TABS: 4.0000 mg | ORAL_TABLET | Freq: Four times a day (QID) | ORAL | Status: DC | PRN

## 2022-08-13 MED ORDER — MAGNESIUM CITRATE PO SOLN: 1.0000 | Freq: Once | ORAL | Status: DC | PRN

## 2022-08-13 MED ORDER — MORPHINE SULFATE (PF) 2 MG/ML IV SOLN
0.5000 mg | INTRAVENOUS | Status: DC | PRN
  Administered 2022-08-14: 1 mg via INTRAVENOUS
  Filled 2022-08-13: qty 1

## 2022-08-13 MED ORDER — DOCUSATE SODIUM 100 MG PO CAPS
100.0000 mg | ORAL_CAPSULE | Freq: Two times a day (BID) | ORAL | Status: DC
  Administered 2022-08-13 – 2022-08-14 (×2): 100 mg via ORAL
  Filled 2022-08-13 (×2): qty 1

## 2022-08-13 SURGICAL SUPPLY — 77 items
AGENT HMST SPONGE THK3/8 (HEMOSTASIS)
ATTUNE MED DOME PAT 38 KNEE (Knees) IMPLANT
ATTUNE PS FEM LT SZ 8 CEM KNEE (Femur) IMPLANT
ATTUNE PSRP INSR SZ8 5 KNEE (Insert) IMPLANT
BAG COUNTER SPONGE SURGICOUNT (BAG) IMPLANT
BAG DECANTER FOR FLEXI CONT (MISCELLANEOUS) ×1 IMPLANT
BAG SPEC THK2 15X12 ZIP CLS (MISCELLANEOUS)
BAG SPNG CNTER NS LX DISP (BAG)
BAG ZIPLOCK 12X15 (MISCELLANEOUS) IMPLANT
BASE TIBIAL ROT PLAT SZ 7 KNEE (Knees) IMPLANT
BLADE SAW SGTL 11.0X1.19X90.0M (BLADE) ×1 IMPLANT
BLADE SAW SGTL 13.0X1.19X90.0M (BLADE) ×1 IMPLANT
BLADE SURG SZ10 CARB STEEL (BLADE) ×2 IMPLANT
BNDG CMPR 5X62 HK CLSR LF (GAUZE/BANDAGES/DRESSINGS) ×1
BNDG CMPR 6"X 5 YARDS HK CLSR (GAUZE/BANDAGES/DRESSINGS) ×1
BNDG CMPR MED 10X6 ELC LF (GAUZE/BANDAGES/DRESSINGS) ×1
BNDG ELASTIC 4X5.8 VLCR STR LF (GAUZE/BANDAGES/DRESSINGS) ×1 IMPLANT
BNDG ELASTIC 6INX 5YD STR LF (GAUZE/BANDAGES/DRESSINGS) ×1 IMPLANT
BNDG ELASTIC 6X10 VLCR STRL LF (GAUZE/BANDAGES/DRESSINGS) IMPLANT
BOWL SMART MIX CTS (DISPOSABLE) ×1 IMPLANT
BSPLAT TIB 7 CMNT ROT PLAT STR (Knees) ×1 IMPLANT
CEMENT HV SMART SET (Cement) ×2 IMPLANT
COVER SURGICAL LIGHT HANDLE (MISCELLANEOUS) ×1 IMPLANT
CUFF TOURN SGL QUICK 34 (TOURNIQUET CUFF) ×1
CUFF TRNQT CYL 34X4.125X (TOURNIQUET CUFF) ×1 IMPLANT
DRAPE INCISE IOBAN 66X45 STRL (DRAPES) IMPLANT
DRAPE ORTHO SPLIT 77X108 STRL (DRAPES) ×2
DRAPE SHEET LG 3/4 BI-LAMINATE (DRAPES) ×1 IMPLANT
DRAPE SURG ORHT 6 SPLT 77X108 (DRAPES) ×2 IMPLANT
DRAPE TOP 10253 STERILE (DRAPES) ×1 IMPLANT
DRAPE U-SHAPE 47X51 STRL (DRAPES) ×1 IMPLANT
DRSG AQUACEL AG ADV 3.5X10 (GAUZE/BANDAGES/DRESSINGS) ×1 IMPLANT
DRSG TEGADERM 4X4.75 (GAUZE/BANDAGES/DRESSINGS) IMPLANT
DURAPREP 26ML APPLICATOR (WOUND CARE) ×1 IMPLANT
ELECT BLADE TIP CTD 4 INCH (ELECTRODE) IMPLANT
ELECT REM PT RETURN 15FT ADLT (MISCELLANEOUS) ×1 IMPLANT
EVACUATOR 1/8 PVC DRAIN (DRAIN) IMPLANT
GAUZE SPONGE 2X2 8PLY STRL LF (GAUZE/BANDAGES/DRESSINGS) IMPLANT
GLOVE BIO SURGEON STRL SZ7 (GLOVE) ×1 IMPLANT
GLOVE BIOGEL PI IND STRL 7.0 (GLOVE) ×1 IMPLANT
GLOVE BIOGEL PI IND STRL 8 (GLOVE) ×1 IMPLANT
GLOVE SURG SS PI 8.0 STRL IVOR (GLOVE) ×1 IMPLANT
GOWN STRL REUS W/ TWL XL LVL3 (GOWN DISPOSABLE) ×2 IMPLANT
GOWN STRL REUS W/TWL XL LVL3 (GOWN DISPOSABLE) ×2
HANDPIECE INTERPULSE COAX TIP (DISPOSABLE) ×1
HEMOSTAT SPONGE AVITENE ULTRA (HEMOSTASIS) IMPLANT
HOLDER FOLEY CATH W/STRAP (MISCELLANEOUS) IMPLANT
IMMOBILIZER KNEE 20 (SOFTGOODS) ×1
IMMOBILIZER KNEE 20 THIGH 36 (SOFTGOODS) ×1 IMPLANT
KIT TURNOVER KIT A (KITS) IMPLANT
MANIFOLD NEPTUNE II (INSTRUMENTS) ×1 IMPLANT
NS IRRIG 1000ML POUR BTL (IV SOLUTION) IMPLANT
PACK TOTAL KNEE CUSTOM (KITS) ×1 IMPLANT
PIN STEINMAN FIXATION KNEE (PIN) IMPLANT
PROTECTOR NERVE ULNAR (MISCELLANEOUS) ×1 IMPLANT
SAW OSC TIP CART 19.5X105X1.3 (SAW) IMPLANT
SEALER BIPOLAR AQUA 6.0 (INSTRUMENTS) IMPLANT
SET HNDPC FAN SPRY TIP SCT (DISPOSABLE) ×1 IMPLANT
SOLUTION PRONTOSAN WOUND 350ML (IRRIGATION / IRRIGATOR) ×1 IMPLANT
SPIKE FLUID TRANSFER (MISCELLANEOUS) ×1 IMPLANT
STAPLER VISISTAT (STAPLE) IMPLANT
STRIP CLOSURE SKIN 1/2X4 (GAUZE/BANDAGES/DRESSINGS) IMPLANT
SUT BONE WAX W31G (SUTURE) ×1 IMPLANT
SUT MNCRL AB 4-0 PS2 18 (SUTURE) IMPLANT
SUT STRATAFIX 0 PDS 27 VIOLET (SUTURE) ×1
SUT VIC AB 1 CT1 27 (SUTURE) ×3
SUT VIC AB 1 CT1 27XBRD ANTBC (SUTURE) ×3 IMPLANT
SUT VIC AB 2-0 CT1 27 (SUTURE) ×3
SUT VIC AB 2-0 CT1 TAPERPNT 27 (SUTURE) ×3 IMPLANT
SUTURE STRATFX 0 PDS 27 VIOLET (SUTURE) ×1 IMPLANT
SYR 3ML LL SCALE MARK (SYRINGE) IMPLANT
TIBIAL BASE ROT PLAT SZ 7 KNEE (Knees) ×1 IMPLANT
TRAY FOLEY MTR SLVR 16FR STAT (SET/KITS/TRAYS/PACK) ×1 IMPLANT
TUBE SUCTION HIGH CAP CLEAR NV (SUCTIONS) ×1 IMPLANT
WATER STERILE IRR 1000ML POUR (IV SOLUTION) ×1 IMPLANT
WIPE CHG 2% PREP (PERSONAL CARE ITEMS) ×1 IMPLANT
WRAP KNEE MAXI GEL POST OP (GAUZE/BANDAGES/DRESSINGS) ×1 IMPLANT

## 2022-08-13 NOTE — Evaluation (Signed)
Physical Therapy Evaluation Patient Details Name: Dakota Gibbs MRN: [id number] DOB: 07/21/1950 Today's Date: 08/13/2022  History of Present Illness  72 yo male presents to therapy s/p L TKA on 08/13/2022 due to failure of conservative measures. Pt PMH includes but not limited to: anxiety and depression, LBP s/p sx (2023), CAD, DM II, HDL, HTN, MI, and  OSA.  Clinical Impression    Dakota Gibbs is a 72 y.o. male POD 0 s/p L TKA. Patient reports IND with mobility at baseline. Patient is now limited by functional impairments (see PT problem list below) and requires min guard with HOB elevated for bed mobility and min guard and cues for transfers. Patient was able to ambulate 85 feet with RW and min guard level of assist. Patient instructed in exercise to facilitate ROM and circulation to manage edema. Provided incentive spirometer and with Vcs pt able to achieve 2500 mL. Patient will benefit from continued skilled PT interventions to address impairments and progress towards PLOF. Acute PT will follow to progress mobility and stair training in preparation for safe discharge home with family support and pt reporting HH services.      Recommendations for follow up therapy are one component of a multi-disciplinary discharge planning process, led by the attending physician.  Recommendations may be updated based on patient status, additional functional criteria and insurance authorization.  Follow Up Recommendations       Assistance Recommended at Discharge Intermittent Supervision/Assistance  Patient can return home with the following  A little help with walking and/or transfers;A little help with bathing/dressing/bathroom;Assistance with cooking/housework;Help with stairs or ramp for entrance;Assist for transportation    Equipment Recommendations None recommended by PT (pt reports DME in home setting)  Recommendations for Other Services       Functional Status Assessment Patient has had  a recent decline in their functional status and/or demonstrates limited ability to make significant improvements in function in a reasonable and predictable amount of time     Precautions / Restrictions Precautions Precautions: Knee;Fall Restrictions Weight Bearing Restrictions: No      Mobility  Bed Mobility Overal bed mobility: Needs Assistance Bed Mobility: Supine to Sit     Supine to sit: Min guard          Transfers Overall transfer level: Needs assistance Equipment used: Rolling walker (2 wheels) Transfers: Sit to/from Stand Sit to Stand: Min guard           General transfer comment: cues for proper UE and AD placement    Ambulation/Gait Ambulation/Gait assistance: Min guard Gait Distance (Feet): 85 Feet Assistive device: Rolling walker (2 wheels) Gait Pattern/deviations: Step-to pattern, Decreased stance time - left, Antalgic Gait velocity: decreaesd        Stairs            Wheelchair Mobility    Modified Rankin (Stroke Patients Only)       Balance Overall balance assessment: Needs assistance, History of Falls Sitting-balance support: Feet supported Sitting balance-Leahy Scale: Good     Standing balance support: No upper extremity supported, Reliant on assistive device for balance, During functional activity Standing balance-Leahy Scale: Poor                               Pertinent Vitals/Pain Pain Assessment Pain Assessment: 0-10 Pain Score: 3  Pain Location: L knee Pain Descriptors / Indicators: Discomfort, Operative site guarding Pain Intervention(s): Limited activity within patient's tolerance, Monitored  during session, Premedicated before session, Repositioned, Ice applied (pt indicated 1/10 pain at rest)    Home Living Family/patient expects to be discharged to:: Private residence Living Arrangements: Spouse/significant other Available Help at Discharge: Family Type of Home: House Home Access: Stairs to  enter Entrance Stairs-Rails: None Entrance Stairs-Number of Steps: 2   Home Layout: Two level;Able to live on main level with bedroom/bathroom Home Equipment: Rolling Walker (2 wheels)      Prior Function Prior Level of Function : Independent/Modified Independent;Driving             Mobility Comments: IND with all ADLs self care tasks and IADLs       Hand Dominance        Extremity/Trunk Assessment        Lower Extremity Assessment Lower Extremity Assessment: LLE deficits/detail LLE Deficits / Details: ankle DF/PF 5/5; SLR < 10 degree lag LLE Sensation: WNL       Communication   Communication: HOH (B hearing aids)  Cognition Arousal/Alertness: Awake/alert Behavior During Therapy: WFL for tasks assessed/performed Overall Cognitive Status: Within Functional Limits for tasks assessed                                          General Comments      Exercises Total Joint Exercises Ankle Circles/Pumps: PROM, Both, 20 reps Quad Sets: AROM, Left, 5 reps   Assessment/Plan    PT Assessment Patient needs continued PT services  PT Problem List Decreased strength;Decreased range of motion;Decreased activity tolerance;Decreased balance;Decreased mobility;Decreased coordination;Pain       PT Treatment Interventions DME instruction;Gait training;Stair training;Functional mobility training;Therapeutic activities;Therapeutic exercise;Balance training;Neuromuscular re-education;Patient/family education;Modalities    PT Goals (Current goals can be found in the Care Plan section)  Acute Rehab PT Goals Patient Stated Goal: to be able to golf again and walk 2 miles without pain PT Goal Formulation: With patient Time For Goal Achievement: 08/27/22 Potential to Achieve Goals: Good    Frequency 7X/week     Co-evaluation               AM-PAC PT "6 Clicks" Mobility  Outcome Measure Help needed turning from your back to your side while in a flat bed  without using bedrails?: A Little Help needed moving from lying on your back to sitting on the side of a flat bed without using bedrails?: A Little Help needed moving to and from a bed to a chair (including a wheelchair)?: A Little Help needed standing up from a chair using your arms (e.g., wheelchair or bedside chair)?: A Little Help needed to walk in hospital room?: A Little Help needed climbing 3-5 steps with a railing? : A Lot 6 Click Score: 17    End of Session Equipment Utilized During Treatment: Gait belt Activity Tolerance: Patient tolerated treatment well Patient left: in chair;with call bell/phone within reach (pt instructed to use call bell and await staff S and A prior to transfer tasks and pt demonstrated verbal understanding) Nurse Communication: Mobility status PT Visit Diagnosis: Unsteadiness on feet (R26.81);Other abnormalities of gait and mobility (R26.89);Muscle weakness (generalized) (M62.81);History of falling (Z91.81);Pain Pain - Right/Left: Left Pain - part of body: Knee    Time: 8891-6945 PT Time Calculation (min) (ACUTE ONLY): 34 min   Charges:   PT Evaluation $PT Eval Low Complexity: 1 Low PT Treatments $Gait Training: 8-22 mins  Rica Mote, PT   Jacqualyn Posey 08/13/2022, 6:53 PM

## 2022-08-13 NOTE — Brief Op Note (Signed)
08/13/2022  10:17 AM  PATIENT:  Dakota Gibbs  72 y.o. male  PRE-OPERATIVE DIAGNOSIS:  Left knee degenerative joint disease  POST-OPERATIVE DIAGNOSIS:  * No post-op diagnosis entered *  PROCEDURE:  Procedure(s): TOTAL KNEE ARTHROPLASTY (Left)  SURGEON:  Surgeon(s) and Role:    Jene Every, MD - Primary  PHYSICIAN ASSISTANT:   ASSISTANTS: Bissell   ANESTHESIA:   general  EBL:  25cc   BLOOD ADMINISTERED:none  DRAINS: none   LOCAL MEDICATIONS USED:  MARCAINE     SPECIMEN:  No Specimen  DISPOSITION OF SPECIMEN:  N/A  COUNTS:  YES  TOURNIQUET:   DICTATION: .Other Dictation: Dictation Number [id number]    PLAN OF CARE: Admit for overnight observation  PATIENT DISPOSITION:  PACU - hemodynamically stable.   Delay start of Pharmacological VTE agent (>24hrs) due to surgical blood loss or risk of bleeding: no

## 2022-08-13 NOTE — Anesthesia Preprocedure Evaluation (Addendum)
Anesthesia Evaluation  Patient identified by MRN, date of birth, ID band Patient awake    Reviewed: Allergy & Precautions, H&P , NPO status , Patient's Chart, lab work & pertinent test results  Airway Mallampati: II  TM Distance: >3 FB Neck ROM: Full    Dental no notable dental hx.    Pulmonary sleep apnea , former smoker   Pulmonary exam normal breath sounds clear to auscultation       Cardiovascular hypertension, Pt. on medications + CAD and + Past MI  Normal cardiovascular exam Rhythm:Regular Rate:Normal     Neuro/Psych   Anxiety Depression    negative neurological ROS  negative psych ROS   GI/Hepatic Neg liver ROS,GERD  ,,  Endo/Other  diabetes, Type 2, Oral Hypoglycemic Agents    Renal/GU negative Renal ROS  negative genitourinary   Musculoskeletal  (+) Arthritis , Osteoarthritis,    Abdominal   Peds negative pediatric ROS (+)  Hematology negative hematology ROS (+)   Anesthesia Other Findings   Reproductive/Obstetrics negative OB ROS                             Anesthesia Physical Anesthesia Plan  ASA: 3  Anesthesia Plan: Regional and General   Post-op Pain Management: Regional block* and Dilaudid IV   Induction: Intravenous  PONV Risk Score and Plan: 2 and Ondansetron, Treatment may vary due to age or medical condition and Midazolam  Airway Management Planned: LMA and Oral ETT  Additional Equipment:   Intra-op Plan:   Post-operative Plan: Extubation in OR  Informed Consent: I have reviewed the patients History and Physical, chart, labs and discussed the procedure including the risks, benefits and alternatives for the proposed anesthesia with the patient or authorized representative who has indicated his/her understanding and acceptance.     Dental advisory given  Plan Discussed with: CRNA  Anesthesia Plan Comments:        Anesthesia Quick Evaluation

## 2022-08-13 NOTE — Anesthesia Procedure Notes (Signed)
Procedure Name: LMA Insertion Date/Time: 08/13/2022 10:47 AM  Performed by: Orest Dikes, CRNAPre-anesthesia Checklist: Patient identified, Emergency Drugs available, Suction available and Patient being monitored Patient Re-evaluated:Patient Re-evaluated prior to induction Oxygen Delivery Method: Circle system utilized Preoxygenation: Pre-oxygenation with 100% oxygen Induction Type: IV induction LMA: LMA with gastric port inserted LMA Size: 4.0 Number of attempts: 1 Placement Confirmation: positive ETCO2 and breath sounds checked- equal and bilateral Tube secured with: Tape Dental Injury: Teeth and Oropharynx as per pre-operative assessment

## 2022-08-13 NOTE — Interval H&P Note (Signed)
History and Physical Interval Note:  08/13/2022 8:23 AM  Dakota Gibbs  has presented today for surgery, with the diagnosis of Left knee degenerative joint disease.  The various methods of treatment have been discussed with the patient and family. After consideration of risks, benefits and other options for treatment, the patient has consented to  Procedure(s): TOTAL KNEE ARTHROPLASTY (Left) as a surgical intervention.  The patient's history has been reviewed, patient examined, no change in status, stable for surgery.  I have reviewed the patient's chart and labs.  Questions were answered to the patient's satisfaction.     Javier Docker

## 2022-08-13 NOTE — Anesthesia Procedure Notes (Signed)
Anesthesia Regional Block: Adductor canal block   Pre-Anesthetic Checklist: , timeout performed,  Correct Patient, Correct Site, Correct Laterality,  Correct Procedure, Correct Position, site marked,  Risks and benefits discussed,  Surgical consent,  Pre-op evaluation,  At surgeon's request and post-op pain management  Laterality: Left  Prep: chloraprep       Needles:  Injection technique: Single-shot  Needle Type: Stimiplex     Needle Length: 9cm  Needle Gauge: 21     Additional Needles:   Procedures:,,,, ultrasound used (permanent image in chart),,    Narrative:  Start time: 08/13/2022 9:11 AM End time: 08/13/2022 9:16 AM Injection made incrementally with aspirations every 5 mL.  Performed by: Personally  Anesthesiologist: Lowella Curb, MD

## 2022-08-13 NOTE — Discharge Instructions (Signed)

## 2022-08-13 NOTE — Transfer of Care (Signed)
Immediate Anesthesia Transfer of Care Note  Patient: [name]  Procedure(s) Performed: TOTAL KNEE ARTHROPLASTY (Left: Knee)  Patient Location: PACU  Anesthesia Type:General  Level of Consciousness: awake, alert , and oriented  Airway & Oxygen Therapy: Patient Spontanous Breathing and Patient connected to face mask oxygen  Post-op Assessment: Report given to RN and Post -op Vital signs reviewed and stable  Post vital signs: Reviewed and stable  Last Vitals:  Vitals Value Taken Time  BP    Temp    Pulse 73 08/13/22 1315  Resp 13 08/13/22 1315  SpO2 95 % 08/13/22 1315  Vitals shown include unvalidated device data.  Last Pain:  Vitals:   08/13/22 0925  TempSrc:   PainSc: 0-No pain         Complications: No notable events documented.

## 2022-08-13 NOTE — Op Note (Unsigned)
NAME: Dakota Gibbs, EBERSOLE MEDICAL RECORD NO: [id number] ACCOUNT NO: [id number] DATE OF BIRTH: July 22, 1950 FACILITY: [facility] LOCATION: [id number] PHYSICIAN: Javier Docker, MD  Operative Report   DATE OF PROCEDURE: 08/13/2022  PREOPERATIVE DIAGNOSIS:  End-stage osteoarthrosis, left knee, varus deformity.  POSTOPERATIVE DIAGNOSES:  End-stage osteoarthrosis, left knee, varus deformity.  PROCEDURE PERFORMED:  Left total knee arthroplasty utilizing the DePuy Attune rotating platform 7 femur, 7 tibia, 5 mm insert, 38 patella.  ANESTHESIA:  General.  ASSISTANT:  Andrez Grime, PA.  HISTORY:  A 72 year old with end-stage osteoarthrosis medial compartment knee and the patellofemoral joint.  Refractory conservative treatment, flexion contracture indicated for replacement of the degenerated joint.  Risks and benefits discussed  including bleeding, infection, damage to neurovascular structures, no change in symptoms, worsening symptoms, DVT, PE, anesthetic complications, etc.  DESCRIPTION OF PROCEDURE:  The patient in supine position, after induction of adequate general anesthesia, 2 grams Kefzol left lower extremity was prepped and draped in the usual sterile fashion.  Exsanguinated thigh tourniquet inflated to 225 mmHg.   Midline incision was then made over the knee.  Full thickness flaps developed.  Median parapatellar arthrotomy was then performed.  Significant fibrosis was noted from previous arthroscopic surgery.  We debrided the fat pad.  I mobilized the patella.   Gently everted it, flexed the knee, tricompartmental osteoarthrosis was noted bone-on-bone, particularly medial compartment, patellofemoral joint.  Leksell rongeur utilized to remove the remnants of medial and lateral menisci and of the ACL and to  perform a femoral notch as a starting hole for the femoral drill, drilled in line with the femur.  Irrigated it T-handle with all bony surfaces contained.  Intramedullary guide, 5-degree left  with 10 off the distal femur due to a slight flexion  contracture.  This was pinned and I performed a distal femoral cut.  We then sized the femur from the anterior cortex to be an 8 and then a 7.  We then pinned our block in 3 degrees external rotation.  Put our distal femoral block.  I performed anterior,  posterior and chamfer cuts with the soft tissues protected posteriorly at all times.  Again, a fairly tight knee noted at this point as well.  Subluxed the tibia low side was medially.  Three off the defect, which was medially using external alignment  guide parallel to the shaft bisecting the tibiotalar joint 3-degree slope.  This was pinned and I performed our proximal tibial cut with soft tissues protected posteriorly at all times.  The cut; however, did not include the sclerotic bone medially.  I  therefore re-pinned with additional 2 mm off medially, measured.  Then, cut our tibia.  Sclerotic bone was noted medially.  I used a 6 mm block and he had good extension and good flexion and the gaps were equivalent.  I then subluxed the tibia, sized to  a 7 maximizing coverage.  I harvested bone from the proximal tibia and impacted into the distal femur.  I drilled centrally and used our punch guide.  We then turned attention to the femur, used a box cut jig bisecting the canal.  I then placed a trial  femur, which sit flush.  Drilled our lug holes, placed a 5 mm insert, reduced the knee, had full extension and full flexion, good stability to varus valgus stressing at 0 and 30 degrees.  Everted the patella, measured to a 24, planed it to a 14 with her  patellar jig.  This was measured to a  38 with a paddle parallel to the joint surface, drilling peg holes, medializing the patella, placed a trial patella and reduced it and had excellent patellofemoral tracking.  All instrumentation was removed and  checked posteriorly remnants of the menisci were excised.  Popliteus was intact.  Cauterized the geniculates.   Pulsatile lavage was used to clean all bony surfaces and then the knee was flexed.  All surfaces thoroughly dried.  Mixed cement on back table  in appropriate fashion under vacuum placed cement proximal tibia, digitally pressurizing it after I drilled multiple drill holes in the medial tibial plateau, due to the sclerosis.  Cement was placed on the tibia component and was impacted into place.   Redundant cement removed, I cemented and impacted the femur with cement on both the femoral component and the femur.  Placed a 5 insert, reduced it and held in axial load throughout the curing of the cement, cemented and clamped the patella.  Redundant  cement was removed.  Marcaine with epinephrine was placed in the joint during the curing of the cement.  We had used Exparel as well, quadriceps tendon, periosteum of the femur through the medial capsule and the proximal tibia.  Prontosan was placed in  the wound.  We covered the wound.  After curing of the cement, tourniquet was deflated at 72 minutes.  Any minor bleeding was cauterized.  Next, we removed the insert.  I selected a 5 mm insert.  I meticulously removed all redundant cement.  Copiously  irrigated the wound once again with pulsatile lavage and Prontosan.  I placed a 5 permanent polyethylene insert, reduced it, had full extension, full flexion, good stability with varus and valgus stressing at 0-30 degrees, negative anterior drawer.  In  slight flexion of the knee the patellar arthrotomy was reapproximated with #1 Vicryl in interrupted figure-of-eight sutures.  This was oversewn with a running Stratafix.  The remainder of the Marcaine and epinephrine was deposited in the wound as well.   Next copiously irrigated subcutaneous tissue, subcu with 2-0 and skin with staples.  Wound was dressed sterilely, placed in immobilizer, extubated, and transported to the recovery room in satisfactory condition.  The patient tolerated the procedure well.  No  complications.  ASSISTANT:  Andrez Grime, PA, was used throughout the case for patient positioning, closure and exposure.  BLOOD LOSS:  25 mL   PUS D: 08/13/2022 1:16:46 pm T: 08/13/2022 4:17:00 pm  JOB: [id number]/ [id number]

## 2022-08-14 ENCOUNTER — Observation Stay (HOSPITAL_COMMUNITY): Payer: Medicare Other

## 2022-08-14 ENCOUNTER — Encounter (HOSPITAL_COMMUNITY): Payer: Self-pay | Admitting: Specialist

## 2022-08-14 DIAGNOSIS — E8770 Fluid overload, unspecified: Secondary | ICD-10-CM | POA: Diagnosis not present

## 2022-08-14 DIAGNOSIS — Z0389 Encounter for observation for other suspected diseases and conditions ruled out: Secondary | ICD-10-CM | POA: Diagnosis not present

## 2022-08-14 DIAGNOSIS — E871 Hypo-osmolality and hyponatremia: Secondary | ICD-10-CM | POA: Diagnosis not present

## 2022-08-14 DIAGNOSIS — Z7982 Long term (current) use of aspirin: Secondary | ICD-10-CM | POA: Diagnosis not present

## 2022-08-14 DIAGNOSIS — R531 Weakness: Secondary | ICD-10-CM | POA: Diagnosis not present

## 2022-08-14 DIAGNOSIS — R0609 Other forms of dyspnea: Secondary | ICD-10-CM | POA: Diagnosis not present

## 2022-08-14 DIAGNOSIS — E119 Type 2 diabetes mellitus without complications: Secondary | ICD-10-CM | POA: Diagnosis not present

## 2022-08-14 DIAGNOSIS — I252 Old myocardial infarction: Secondary | ICD-10-CM | POA: Diagnosis not present

## 2022-08-14 DIAGNOSIS — R509 Fever, unspecified: Secondary | ICD-10-CM | POA: Diagnosis not present

## 2022-08-14 DIAGNOSIS — J9601 Acute respiratory failure with hypoxia: Secondary | ICD-10-CM | POA: Diagnosis not present

## 2022-08-14 DIAGNOSIS — R21 Rash and other nonspecific skin eruption: Secondary | ICD-10-CM | POA: Diagnosis not present

## 2022-08-14 DIAGNOSIS — F419 Anxiety disorder, unspecified: Secondary | ICD-10-CM | POA: Diagnosis not present

## 2022-08-14 DIAGNOSIS — Z79899 Other long term (current) drug therapy: Secondary | ICD-10-CM | POA: Diagnosis not present

## 2022-08-14 DIAGNOSIS — K219 Gastro-esophageal reflux disease without esophagitis: Secondary | ICD-10-CM | POA: Diagnosis not present

## 2022-08-14 DIAGNOSIS — T502X5A Adverse effect of carbonic-anhydrase inhibitors, benzothiadiazides and other diuretics, initial encounter: Secondary | ICD-10-CM | POA: Diagnosis not present

## 2022-08-14 DIAGNOSIS — M7989 Other specified soft tissue disorders: Secondary | ICD-10-CM | POA: Diagnosis not present

## 2022-08-14 DIAGNOSIS — Z96652 Presence of left artificial knee joint: Secondary | ICD-10-CM | POA: Diagnosis not present

## 2022-08-14 DIAGNOSIS — F32A Depression, unspecified: Secondary | ICD-10-CM | POA: Diagnosis not present

## 2022-08-14 DIAGNOSIS — Z87891 Personal history of nicotine dependence: Secondary | ICD-10-CM | POA: Diagnosis not present

## 2022-08-14 DIAGNOSIS — E785 Hyperlipidemia, unspecified: Secondary | ICD-10-CM | POA: Diagnosis not present

## 2022-08-14 DIAGNOSIS — E1142 Type 2 diabetes mellitus with diabetic polyneuropathy: Secondary | ICD-10-CM | POA: Diagnosis not present

## 2022-08-14 DIAGNOSIS — Z85828 Personal history of other malignant neoplasm of skin: Secondary | ICD-10-CM | POA: Diagnosis not present

## 2022-08-14 DIAGNOSIS — Z471 Aftercare following joint replacement surgery: Secondary | ICD-10-CM | POA: Diagnosis not present

## 2022-08-14 DIAGNOSIS — M1711 Unilateral primary osteoarthritis, right knee: Secondary | ICD-10-CM | POA: Diagnosis not present

## 2022-08-14 DIAGNOSIS — E669 Obesity, unspecified: Secondary | ICD-10-CM | POA: Diagnosis not present

## 2022-08-14 DIAGNOSIS — Z6829 Body mass index (BMI) 29.0-29.9, adult: Secondary | ICD-10-CM | POA: Diagnosis not present

## 2022-08-14 DIAGNOSIS — I10 Essential (primary) hypertension: Secondary | ICD-10-CM | POA: Diagnosis not present

## 2022-08-14 DIAGNOSIS — Z8249 Family history of ischemic heart disease and other diseases of the circulatory system: Secondary | ICD-10-CM | POA: Diagnosis not present

## 2022-08-14 DIAGNOSIS — Z823 Family history of stroke: Secondary | ICD-10-CM | POA: Diagnosis not present

## 2022-08-14 DIAGNOSIS — R0602 Shortness of breath: Secondary | ICD-10-CM | POA: Diagnosis not present

## 2022-08-14 DIAGNOSIS — G4733 Obstructive sleep apnea (adult) (pediatric): Secondary | ICD-10-CM | POA: Diagnosis not present

## 2022-08-14 DIAGNOSIS — Z1152 Encounter for screening for COVID-19: Secondary | ICD-10-CM | POA: Diagnosis not present

## 2022-08-14 DIAGNOSIS — I2510 Atherosclerotic heart disease of native coronary artery without angina pectoris: Secondary | ICD-10-CM | POA: Diagnosis not present

## 2022-08-14 DIAGNOSIS — E876 Hypokalemia: Secondary | ICD-10-CM | POA: Diagnosis not present

## 2022-08-14 DIAGNOSIS — J9811 Atelectasis: Secondary | ICD-10-CM | POA: Diagnosis not present

## 2022-08-14 DIAGNOSIS — I493 Ventricular premature depolarization: Secondary | ICD-10-CM | POA: Diagnosis not present

## 2022-08-14 DIAGNOSIS — R0902 Hypoxemia: Secondary | ICD-10-CM | POA: Diagnosis not present

## 2022-08-14 DIAGNOSIS — R5082 Postprocedural fever: Secondary | ICD-10-CM | POA: Diagnosis not present

## 2022-08-14 DIAGNOSIS — Z7984 Long term (current) use of oral hypoglycemic drugs: Secondary | ICD-10-CM | POA: Diagnosis not present

## 2022-08-14 DIAGNOSIS — E1141 Type 2 diabetes mellitus with diabetic mononeuropathy: Secondary | ICD-10-CM | POA: Diagnosis not present

## 2022-08-14 DIAGNOSIS — I959 Hypotension, unspecified: Secondary | ICD-10-CM | POA: Diagnosis not present

## 2022-08-14 LAB — BASIC METABOLIC PANEL
Anion gap: 7 (ref 5–15)
BUN: 16 mg/dL (ref 8–23)
CO2: 25 mmol/L (ref 22–32)
Calcium: 8 mg/dL — ABNORMAL LOW (ref 8.9–10.3)
Chloride: 99 mmol/L (ref 98–111)
Creatinine, Ser: 0.82 mg/dL (ref 0.61–1.24)
GFR, Estimated: >60 mL/min (ref >60)
Glucose, Bld: 156 mg/dL — ABNORMAL HIGH (ref 70–99)
Potassium: 4.4 mmol/L (ref 3.5–5.1)
Sodium: 131 mmol/L — ABNORMAL LOW (ref 135–145)

## 2022-08-14 LAB — CBC WITH DIFFERENTIAL/PLATELET
Abs Immature Granulocytes: 0.04 10*3/uL (ref 0.00–0.07)
Basophils Absolute: 0 10*3/uL (ref 0.0–0.1)
Basophils Relative: 0 %
Eosinophils Absolute: 0 10*3/uL (ref 0.0–0.5)
Eosinophils Relative: 0 %
HCT: 37.5 % — ABNORMAL LOW (ref 39.0–52.0)
Hemoglobin: 12.3 g/dL — ABNORMAL LOW (ref 13.0–17.0)
Immature Granulocytes: 0 %
Lymphocytes Relative: 14 %
Lymphs Abs: 1.4 10*3/uL (ref 0.7–4.0)
MCH: 31.3 pg (ref 26.0–34.0)
MCHC: 32.8 g/dL (ref 30.0–36.0)
MCV: 95.4 fL (ref 80.0–100.0)
Monocytes Absolute: 1 10*3/uL (ref 0.1–1.0)
Monocytes Relative: 10 %
Neutro Abs: 7.5 10*3/uL (ref 1.7–7.7)
Neutrophils Relative %: 76 %
Platelets: 220 10*3/uL (ref 150–400)
RBC: 3.93 MIL/uL — ABNORMAL LOW (ref 4.22–5.81)
RDW: 13.2 % (ref 11.5–15.5)
WBC: 10 10*3/uL (ref 4.0–10.5)
nRBC: 0 % (ref 0.0–0.2)

## 2022-08-14 LAB — GLUCOSE, CAPILLARY
Glucose-Capillary: 164 mg/dL — ABNORMAL HIGH (ref 70–99)
Glucose-Capillary: 185 mg/dL — ABNORMAL HIGH (ref 70–99)

## 2022-08-14 NOTE — Progress Notes (Signed)
Physical Therapy Treatment Patient Details Name: Dakota Gibbs MRN: [id number] DOB: 08-08-1950 Today's Date: 08/14/2022   History of Present Illness 72 yo male presents to therapy s/p L TKA on 08/13/2022 due to failure of conservative measures. Pt PMH includes but not limited to: anxiety and depression, LBP s/p sx (2023), CAD, DM II, HDL, HTN, MI, and  OSA.    PT Comments    Pt ambulated in hallway and practiced safe stair technique.  Pt anticipates d/c home later today.    Recommendations for follow up therapy are one component of a multi-disciplinary discharge planning process, led by the attending physician.  Recommendations may be updated based on patient status, additional functional criteria and insurance authorization.  Follow Up Recommendations       Assistance Recommended at Discharge Intermittent Supervision/Assistance  Patient can return home with the following A little help with walking and/or transfers;A little help with bathing/dressing/bathroom;Assistance with cooking/housework;Help with stairs or ramp for entrance;Assist for transportation   Equipment Recommendations  None recommended by PT    Recommendations for Other Services       Precautions / Restrictions Precautions Precautions: Knee;Fall Restrictions Weight Bearing Restrictions: No LLE Weight Bearing: Weight bearing as tolerated     Mobility  Bed Mobility Overal bed mobility: Needs Assistance Bed Mobility: Supine to Sit     Supine to sit: Min guard, HOB elevated          Transfers Overall transfer level: Needs assistance Equipment used: Rolling walker (2 wheels) Transfers: Sit to/from Stand Sit to Stand: Min guard           General transfer comment: cues for UE and LE positioning    Ambulation/Gait Ambulation/Gait assistance: Min guard Gait Distance (Feet): 120 Feet Assistive device: Rolling walker (2 wheels) Gait Pattern/deviations: Step-to pattern, Decreased stance time -  left, Antalgic Gait velocity: decreaesd     General Gait Details: verbal cues for sequence, RW positioning, heel strike, posture   Stairs Stairs: Yes Stairs assistance: Min guard Stair Management: Step to pattern, Backwards, With walker Number of Stairs: 2 General stair comments: verbal cues for sequence, RW positioning, safety   Wheelchair Mobility    Modified Rankin (Stroke Patients Only)       Balance                                            Cognition Arousal/Alertness: Awake/alert Behavior During Therapy: WFL for tasks assessed/performed Overall Cognitive Status: Within Functional Limits for tasks assessed                                          Exercises      General Comments        Pertinent Vitals/Pain Pain Assessment Pain Assessment: 0-10 Pain Score: 6  Pain Location: L knee Pain Descriptors / Indicators: Operative site guarding, Aching, Sore Pain Intervention(s): Monitored during session, Repositioned, Premedicated before session    Home Living                          Prior Function            PT Goals (current goals can now be found in the care plan section) Progress towards PT goals: Progressing toward goals  Frequency    7X/week      PT Plan Current plan remains appropriate    Co-evaluation              AM-PAC PT "6 Clicks" Mobility   Outcome Measure  Help needed turning from your back to your side while in a flat bed without using bedrails?: A Little Help needed moving from lying on your back to sitting on the side of a flat bed without using bedrails?: A Little Help needed moving to and from a bed to a chair (including a wheelchair)?: A Little Help needed standing up from a chair using your arms (e.g., wheelchair or bedside chair)?: A Little Help needed to walk in hospital room?: A Little Help needed climbing 3-5 steps with a railing? : A Little 6 Click Score: 18     End of Session Equipment Utilized During Treatment: Gait belt Activity Tolerance: Patient tolerated treatment well Patient left: in chair;with call bell/phone within reach;with chair alarm set Nurse Communication: Mobility status PT Visit Diagnosis: Difficulty in walking, not elsewhere classified (R26.2)     Time: 1012-1027 PT Time Calculation (min) (ACUTE ONLY): 15 min  Charges:  $Gait Training: 8-22 mins                    Paulino Door, DPT Physical Therapist Acute Rehabilitation Services Office: [phone]    Janan Halter [place] 08/14/2022, 12:42 PM

## 2022-08-14 NOTE — Plan of Care (Signed)

## 2022-08-14 NOTE — TOC Transition Note (Signed)
Transition of Care [facility]) - CM/SW Discharge Note   Patient Details  Name: Dakota Gibbs MRN: [id number] Date of Birth: [birth year]  Transition of Care [facility]) CM/SW Contact:  Amada Jupiter, LCSW Phone Number: 08/14/2022, 9:23 AM   Clinical Narrative:     Met with pt who now reporting he does NOT have a RW at home - no DME agency preference - order placed with [employer] for delivery to room.  HHPT prearrnaged with [facility].  No further TOC needs.  Final next level of care: Home w Home Health Services Barriers to Discharge: No Barriers Identified   Patient Goals and CMS Choice      Discharge Placement                         Discharge Plan and Services Additional resources added to the After Visit Summary for                  DME Arranged: Walker rolling DME Agency: [employer] Date DME Agency Contacted: 08/14/22 Time DME Agency Contacted: [phone] Representative spoke with at DME Agency: Loraine Leriche HH Arranged: PT HH Agency: [facility]        Social Determinants of Health (SDOH) Interventions SDOH Screenings   Food Insecurity: No Food Insecurity (08/13/2022)  Housing: Low Risk  (08/13/2022)  Transportation Needs: No Transportation Needs (08/13/2022)  Utilities: Not At Risk (08/13/2022)  Depression (PHQ2-9): Low Risk  (06/23/2022)  Tobacco Use: Medium Risk (08/13/2022)     Readmission Risk Interventions     No data to display

## 2022-08-14 NOTE — Progress Notes (Signed)
Subjective: 1 Day Post-Op Procedure(s) (LRB): TOTAL KNEE ARTHROPLASTY (Left) Patient reports pain as 3 on 0-10 scale.   Denies CP or SOB.  Voiding without difficulty. Positive flatus. Objective: Vital signs in last 24 hours: Temp:  [98.4 F (36.9 C)-100 F (37.8 C)] 99.4 F (37.4 C) (04/11 1012) Pulse Rate:  [52-73] 54 (04/11 1012) Resp:  [13-21] 15 (04/11 1012) BP: (124-161)/(42-81) 157/59 (04/11 1012) SpO2:  [91 %-97 %] 96 % (04/11 1012)  Intake/Output from previous day: 04/10 0701 - 04/11 0700 In: 3140.7 [P.O.:1145; I.V.:1595.7; IV Piggyback:400] Out: 1350 [Urine:1300; Blood:50] Intake/Output this shift: Total I/O In: 280 [P.O.:240; I.V.:40] Out: 0   Recent Labs    08/14/22 0805  HGB 12.3*   Recent Labs    08/14/22 0805  WBC 10.0  RBC 3.93*  HCT 37.5*  PLT 220   Recent Labs    08/14/22 0805  NA 131*  K 4.4  CL 99  CO2 25  BUN 16  CREATININE 0.82  GLUCOSE 156*  CALCIUM 8.0*   No results for input(s): "LABPT", "INR" in the last 72 hours.  Neurologically intact ABD soft Sensation intact distally Intact pulses distally Dorsiflexion/Plantar flexion intact Incision: dressing C/D/I Compartment soft No DVT  Assessment/Plan:  1 Day Post-Op Procedure(s) (LRB): TOTAL KNEE ARTHROPLASTY (Left) Discharge home with home health D/C instr given  Principal Problem:   S/P TKR (total knee replacement) using cement, left      Javier Docker 08/14/2022, @NOW 

## 2022-08-14 NOTE — Progress Notes (Signed)
Subjective: 1 Day Post-Op Procedure(s) (LRB): TOTAL KNEE ARTHROPLASTY (Left) Patient reports pain as 4 on 0-10 scale.   Denies CP or SOB.  Voiding without difficulty. Positive flatus. Objective: Vital signs in last 24 hours: Temp:  [98.4 F (36.9 C)-100 F (37.8 C)] 100 F (37.8 C) (04/11 0457) Pulse Rate:  [44-73] 55 (04/11 0457) Resp:  [10-21] 18 (04/11 0457) BP: (93-161)/(42-85) 160/64 (04/11 0457) SpO2:  [91 %-98 %] 93 % (04/11 0457) Weight:  [93.4 kg] 93.4 kg (04/10 0813)  Intake/Output from previous day: 04/10 0701 - 04/11 0700 In: 3140.7 [P.O.:1145; I.V.:1595.7; IV Piggyback:400] Out: 1350 [Urine:1300; Blood:50] Intake/Output this shift: No intake/output data recorded.  No results for input(s): "HGB" in the last 72 hours. No results for input(s): "WBC", "RBC", "HCT", "PLT" in the last 72 hours. No results for input(s): "NA", "K", "CL", "CO2", "BUN", "CREATININE", "GLUCOSE", "CALCIUM" in the last 72 hours. No results for input(s): "LABPT", "INR" in the last 72 hours.  Neurologically intact ABD soft Sensation intact distally Intact pulses distally Dorsiflexion/Plantar flexion intact Incision: dressing C/D/I No DVT  Assessment/Plan:  1 Day Post-Op Procedure(s) (LRB): TOTAL KNEE ARTHROPLASTY (Left) Advance diet Up with therapy Discharge home with home health   Principal Problem:   S/P TKR (total knee replacement) using cement, left      Javier Docker 08/14/2022, @NOW 

## 2022-08-14 NOTE — Anesthesia Postprocedure Evaluation (Signed)
Anesthesia Post Note  Patient: Dakota Gibbs  Procedure(s) Performed: TOTAL KNEE ARTHROPLASTY (Left: Knee)     Patient location during evaluation: PACU Anesthesia Type: Regional and General Level of consciousness: awake and alert Pain management: pain level controlled Vital Signs Assessment: post-procedure vital signs reviewed and stable Respiratory status: spontaneous breathing, nonlabored ventilation and respiratory function stable Cardiovascular status: blood pressure returned to baseline and stable Postop Assessment: no apparent nausea or vomiting Anesthetic complications: no   No notable events documented.  Last Vitals:  Vitals:   08/14/22 0123 08/14/22 0457  BP: (!) 156/75 (!) 160/64  Pulse: (!) 52 (!) 55  Resp: 18 18  Temp: 37.5 C 37.8 C  SpO2: 97% 93%    Last Pain:  Vitals:   08/14/22 0520  TempSrc:   PainSc: 3                  Lowella Curb

## 2022-08-14 NOTE — Progress Notes (Signed)
Physical Therapy Treatment Patient Details Name: Dakota Gibbs MRN: [id number] DOB: Mar 12, 1951 Today's Date: 08/14/2022   History of Present Illness 72 yo male presents to therapy s/p L TKA on 08/13/2022 due to failure of conservative measures. Pt PMH includes but not limited to: anxiety and depression, LBP s/p sx (2023), CAD, DM II, HDL, HTN, MI, and  OSA.    PT Comments    Pt ambulated in hallway, practiced safe stair technique with spouse holding RW, and performed LE exercises.  Pt reports understanding and feels ready for d/c home today. HEP and stair handouts provided.    Recommendations for follow up therapy are one component of a multi-disciplinary discharge planning process, led by the attending physician.  Recommendations may be updated based on patient status, additional functional criteria and insurance authorization.  Follow Up Recommendations       Assistance Recommended at Discharge Intermittent Supervision/Assistance  Patient can return home with the following A little help with walking and/or transfers;A little help with bathing/dressing/bathroom;Assistance with cooking/housework;Help with stairs or ramp for entrance;Assist for transportation   Equipment Recommendations  None recommended by PT    Recommendations for Other Services       Precautions / Restrictions Precautions Precautions: Knee;Fall Restrictions Weight Bearing Restrictions: No LLE Weight Bearing: Weight bearing as tolerated     Mobility  Bed Mobility Overal bed mobility: Needs Assistance Bed Mobility: Supine to Sit     Supine to sit: Min guard, HOB elevated     General bed mobility comments: pt in recliner    Transfers Overall transfer level: Needs assistance Equipment used: Rolling walker (2 wheels) Transfers: Sit to/from Stand Sit to Stand: Min guard           General transfer comment: cues for UE and LE positioning    Ambulation/Gait Ambulation/Gait assistance: Min  guard Gait Distance (Feet): 120 Feet Assistive device: Rolling walker (2 wheels) Gait Pattern/deviations: Step-to pattern, Decreased stance time - left, Antalgic Gait velocity: decreased     General Gait Details: verbal cues for sequence, RW positioning, heel strike, posture   Stairs Stairs: Yes Stairs assistance: Min guard Stair Management: Step to pattern, Backwards, With walker Number of Stairs: 2 General stair comments: verbal cues for sequence, RW positioning, safety; spouse present and assisted with holding RW; both pt and spouse report understanding; handout provided   Wheelchair Mobility    Modified Rankin (Stroke Patients Only)       Balance                                            Cognition Arousal/Alertness: Awake/alert Behavior During Therapy: WFL for tasks assessed/performed Overall Cognitive Status: Within Functional Limits for tasks assessed                                          Exercises Total Joint Exercises Ankle Circles/Pumps: AROM, 10 reps Quad Sets: 10 reps, AROM, Both Heel Slides: AAROM, Left, 10 reps Hip ABduction/ADduction: AAROM, Left, 10 reps Straight Leg Raises: AAROM, Left, 10 reps    General Comments        Pertinent Vitals/Pain Pain Assessment Pain Assessment: 0-10 Pain Score: 4  Pain Location: L knee Pain Descriptors / Indicators: Operative site guarding, Aching, Sore Pain Intervention(s): Monitored during session, Repositioned  Home Living                          Prior Function            PT Goals (current goals can now be found in the care plan section) Progress towards PT goals: Progressing toward goals    Frequency    7X/week      PT Plan Current plan remains appropriate    Co-evaluation              AM-PAC PT "6 Clicks" Mobility   Outcome Measure  Help needed turning from your back to your side while in a flat bed without using bedrails?: A  Little Help needed moving from lying on your back to sitting on the side of a flat bed without using bedrails?: A Little Help needed moving to and from a bed to a chair (including a wheelchair)?: A Little Help needed standing up from a chair using your arms (e.g., wheelchair or bedside chair)?: A Little Help needed to walk in hospital room?: A Little Help needed climbing 3-5 steps with a railing? : A Little 6 Click Score: 18    End of Session Equipment Utilized During Treatment: Gait belt Activity Tolerance: Patient tolerated treatment well Patient left: in chair;with call bell/phone within reach;with chair alarm set Nurse Communication: Mobility status PT Visit Diagnosis: Difficulty in walking, not elsewhere classified (R26.2)     Time: 1203-1218 PT Time Calculation (min) (ACUTE ONLY): 15 min  Charges:   $Therapeutic Exercise: 8-22 mins           Paulino Door, DPT Physical Therapist Acute Rehabilitation Services Office: [phone]    Dakota Gibbs 08/14/2022, 12:53 PM

## 2022-08-15 ENCOUNTER — Inpatient Hospital Stay (HOSPITAL_COMMUNITY)
Admission: EM | Admit: 2022-08-15 | Discharge: 2022-08-19 | DRG: 864 | Disposition: A | Discharge status: Home / Self Care | Payer: Medicare Other | Attending: Internal Medicine | Admitting: Internal Medicine

## 2022-08-15 ENCOUNTER — Encounter (HOSPITAL_COMMUNITY): Payer: Self-pay

## 2022-08-15 ENCOUNTER — Emergency Department (HOSPITAL_COMMUNITY): Payer: Medicare Other

## 2022-08-15 ENCOUNTER — Other Ambulatory Visit: Payer: Self-pay

## 2022-08-15 DIAGNOSIS — E8770 Fluid overload, unspecified: Secondary | ICD-10-CM | POA: Diagnosis not present

## 2022-08-15 DIAGNOSIS — I252 Old myocardial infarction: Secondary | ICD-10-CM | POA: Diagnosis not present

## 2022-08-15 DIAGNOSIS — Z87891 Personal history of nicotine dependence: Secondary | ICD-10-CM

## 2022-08-15 DIAGNOSIS — E119 Type 2 diabetes mellitus without complications: Secondary | ICD-10-CM

## 2022-08-15 DIAGNOSIS — I10 Essential (primary) hypertension: Secondary | ICD-10-CM | POA: Diagnosis present

## 2022-08-15 DIAGNOSIS — G4733 Obstructive sleep apnea (adult) (pediatric): Secondary | ICD-10-CM | POA: Diagnosis not present

## 2022-08-15 DIAGNOSIS — K219 Gastro-esophageal reflux disease without esophagitis: Secondary | ICD-10-CM | POA: Diagnosis not present

## 2022-08-15 DIAGNOSIS — Z6829 Body mass index (BMI) 29.0-29.9, adult: Secondary | ICD-10-CM

## 2022-08-15 DIAGNOSIS — R0902 Hypoxemia: Secondary | ICD-10-CM | POA: Diagnosis present

## 2022-08-15 DIAGNOSIS — Z833 Family history of diabetes mellitus: Secondary | ICD-10-CM

## 2022-08-15 DIAGNOSIS — F32A Depression, unspecified: Secondary | ICD-10-CM | POA: Diagnosis not present

## 2022-08-15 DIAGNOSIS — E669 Obesity, unspecified: Secondary | ICD-10-CM | POA: Diagnosis present

## 2022-08-15 DIAGNOSIS — E871 Hypo-osmolality and hyponatremia: Secondary | ICD-10-CM | POA: Insufficient documentation

## 2022-08-15 DIAGNOSIS — Z85828 Personal history of other malignant neoplasm of skin: Secondary | ICD-10-CM | POA: Diagnosis not present

## 2022-08-15 DIAGNOSIS — Z471 Aftercare following joint replacement surgery: Secondary | ICD-10-CM | POA: Diagnosis not present

## 2022-08-15 DIAGNOSIS — I2510 Atherosclerotic heart disease of native coronary artery without angina pectoris: Secondary | ICD-10-CM | POA: Diagnosis present

## 2022-08-15 DIAGNOSIS — E785 Hyperlipidemia, unspecified: Secondary | ICD-10-CM | POA: Diagnosis present

## 2022-08-15 DIAGNOSIS — Z7982 Long term (current) use of aspirin: Secondary | ICD-10-CM | POA: Diagnosis not present

## 2022-08-15 DIAGNOSIS — Z1152 Encounter for screening for COVID-19: Secondary | ICD-10-CM

## 2022-08-15 DIAGNOSIS — Z96652 Presence of left artificial knee joint: Secondary | ICD-10-CM

## 2022-08-15 DIAGNOSIS — Z8042 Family history of malignant neoplasm of prostate: Secondary | ICD-10-CM

## 2022-08-15 DIAGNOSIS — R509 Fever, unspecified: Secondary | ICD-10-CM

## 2022-08-15 DIAGNOSIS — F419 Anxiety disorder, unspecified: Secondary | ICD-10-CM | POA: Diagnosis not present

## 2022-08-15 DIAGNOSIS — Z7984 Long term (current) use of oral hypoglycemic drugs: Secondary | ICD-10-CM | POA: Diagnosis not present

## 2022-08-15 DIAGNOSIS — Z8249 Family history of ischemic heart disease and other diseases of the circulatory system: Secondary | ICD-10-CM

## 2022-08-15 DIAGNOSIS — R5082 Postprocedural fever: Principal | ICD-10-CM | POA: Diagnosis present

## 2022-08-15 DIAGNOSIS — E1141 Type 2 diabetes mellitus with diabetic mononeuropathy: Secondary | ICD-10-CM | POA: Diagnosis present

## 2022-08-15 DIAGNOSIS — A419 Sepsis, unspecified organism: Secondary | ICD-10-CM | POA: Diagnosis present

## 2022-08-15 DIAGNOSIS — Z79899 Other long term (current) drug therapy: Secondary | ICD-10-CM

## 2022-08-15 DIAGNOSIS — R21 Rash and other nonspecific skin eruption: Secondary | ICD-10-CM | POA: Diagnosis not present

## 2022-08-15 DIAGNOSIS — T502X5A Adverse effect of carbonic-anhydrase inhibitors, benzothiadiazides and other diuretics, initial encounter: Secondary | ICD-10-CM | POA: Diagnosis not present

## 2022-08-15 DIAGNOSIS — J9601 Acute respiratory failure with hypoxia: Secondary | ICD-10-CM

## 2022-08-15 DIAGNOSIS — Z825 Family history of asthma and other chronic lower respiratory diseases: Secondary | ICD-10-CM

## 2022-08-15 DIAGNOSIS — L259 Unspecified contact dermatitis, unspecified cause: Secondary | ICD-10-CM | POA: Diagnosis not present

## 2022-08-15 DIAGNOSIS — E876 Hypokalemia: Secondary | ICD-10-CM | POA: Insufficient documentation

## 2022-08-15 DIAGNOSIS — R0602 Shortness of breath: Principal | ICD-10-CM

## 2022-08-15 DIAGNOSIS — Z823 Family history of stroke: Secondary | ICD-10-CM

## 2022-08-15 DIAGNOSIS — I493 Ventricular premature depolarization: Secondary | ICD-10-CM

## 2022-08-15 LAB — COMPREHENSIVE METABOLIC PANEL
ALT: 16 U/L (ref 0–44)
AST: 25 U/L (ref 15–41)
Albumin: 2.9 g/dL — ABNORMAL LOW (ref 3.5–5.0)
Alkaline Phosphatase: 54 U/L (ref 38–126)
Anion gap: 11 (ref 5–15)
BUN: 14 mg/dL (ref 8–23)
CO2: 24 mmol/L (ref 22–32)
Calcium: 8.6 mg/dL — ABNORMAL LOW (ref 8.9–10.3)
Chloride: 91 mmol/L — ABNORMAL LOW (ref 98–111)
Creatinine, Ser: 0.91 mg/dL (ref 0.61–1.24)
GFR, Estimated: >60 mL/min (ref >60)
Glucose, Bld: 132 mg/dL — ABNORMAL HIGH (ref 70–99)
Potassium: 4.2 mmol/L (ref 3.5–5.1)
Sodium: 126 mmol/L — ABNORMAL LOW (ref 135–145)
Total Bilirubin: 1.2 mg/dL (ref 0.3–1.2)
Total Protein: 6.2 g/dL — ABNORMAL LOW (ref 6.5–8.1)

## 2022-08-15 LAB — RESP PANEL BY RT-PCR (RSV, FLU A&B, COVID)  RVPGX2
Influenza A by PCR: NEGATIVE
Influenza B by PCR: NEGATIVE
Resp Syncytial Virus by PCR: NEGATIVE
SARS Coronavirus 2 by RT PCR: NEGATIVE

## 2022-08-15 LAB — CBC WITH DIFFERENTIAL/PLATELET
Abs Immature Granulocytes: 0.05 10*3/uL (ref 0.00–0.07)
Basophils Absolute: 0 10*3/uL (ref 0.0–0.1)
Basophils Relative: 0 %
Eosinophils Absolute: 0 10*3/uL (ref 0.0–0.5)
Eosinophils Relative: 0 %
HCT: 31.4 % — ABNORMAL LOW (ref 39.0–52.0)
Hemoglobin: 10.9 g/dL — ABNORMAL LOW (ref 13.0–17.0)
Immature Granulocytes: 0 %
Lymphocytes Relative: 11 %
Lymphs Abs: 1.4 10*3/uL (ref 0.7–4.0)
MCH: 31.9 pg (ref 26.0–34.0)
MCHC: 34.7 g/dL (ref 30.0–36.0)
MCV: 91.8 fL (ref 80.0–100.0)
Monocytes Absolute: 1.2 10*3/uL — ABNORMAL HIGH (ref 0.1–1.0)
Monocytes Relative: 10 %
Neutro Abs: 9.3 10*3/uL — ABNORMAL HIGH (ref 1.7–7.7)
Neutrophils Relative %: 79 %
Platelets: 203 10*3/uL (ref 150–400)
RBC: 3.42 MIL/uL — ABNORMAL LOW (ref 4.22–5.81)
RDW: 13.2 % (ref 11.5–15.5)
WBC: 11.9 10*3/uL — ABNORMAL HIGH (ref 4.0–10.5)
nRBC: 0 % (ref 0.0–0.2)

## 2022-08-15 LAB — URINALYSIS, ROUTINE W REFLEX MICROSCOPIC
Bacteria, UA: NONE SEEN
Bilirubin Urine: NEGATIVE
Glucose, UA: NEGATIVE mg/dL
Hgb urine dipstick: NEGATIVE
Ketones, ur: NEGATIVE mg/dL
Leukocytes,Ua: NEGATIVE
Nitrite: NEGATIVE
Protein, ur: 30 mg/dL — AB
Specific Gravity, Urine: 1.02 (ref 1.005–1.030)
pH: 5 (ref 5.0–8.0)

## 2022-08-15 LAB — TROPONIN I (HIGH SENSITIVITY): Troponin I (High Sensitivity): 13 ng/L (ref <18)

## 2022-08-15 LAB — APTT: aPTT: 33 seconds (ref 24–36)

## 2022-08-15 LAB — BRAIN NATRIURETIC PEPTIDE: B Natriuretic Peptide: 312.3 pg/mL — ABNORMAL HIGH (ref 0.0–100.0)

## 2022-08-15 LAB — PROTIME-INR
INR: 1.2 (ref 0.8–1.2)
Prothrombin Time: 15 seconds (ref 11.4–15.2)

## 2022-08-15 LAB — LACTIC ACID, PLASMA: Lactic Acid, Venous: 1.4 mmol/L (ref 0.5–1.9)

## 2022-08-15 NOTE — ED Provider Triage Note (Signed)
Emergency Medicine Provider Triage Evaluation Note  ROBROY CHEVEZ , a 72 y.o. male  was evaluated in triage.  Pt complains of shortness of breath and cough, hypoxic in triage and placed on oxygen.  Knee surgery for replacement in the left leg 2 days ago.  Had a fever of 101 today at home  Review of Systems  Positive: Fever, shortness of breath, cough Negative: Chest pain  Physical Exam  BP (!) 143/48 (BP Location: Right Arm)   Pulse 66   Temp (!) 100.4 F (38 C) (Oral)   Resp 18   Ht 5' 9.5" (1.765 m)   Wt 93.4 kg   SpO2 93%   BMI 29.98 kg/m  Gen:   Awake, no distress   Resp:  Mildly tachypneic MSK:   Moves extremities without difficulty, deferred removal of knee brace   Medical Decision Making  Medically screening exam initiated at 7:42 PM.  Appropriate orders placed.  [name] was informed that the remainder of the evaluation will be completed by another provider, this initial triage assessment does not replace that evaluation, and the importance of remaining in the ED until their evaluation is complete.     Virgina Norfolk, DO 08/15/22 1943

## 2022-08-15 NOTE — ED Triage Notes (Signed)
Pt BIB [facility] from home c/o [facility] and generalized weakness. Pt had left knee surgery last wednesday and was discharged yesterday. Per EMS pt was 88% on RA,pt placed on 2L. Pt states the [facility] is worse with exertion.

## 2022-08-15 NOTE — ED Provider Notes (Signed)
[place] EMERGENCY DEPARTMENT AT [facility] Provider Note   CSN: [id number] Arrival date & time: 08/15/22  1902     History {Add pertinent medical, surgical, social history, OB history to HPI:1} Chief Complaint  Patient presents with   Post-op Problem    Dakota Gibbs is a 72 y.o. male.  Patient is here with shortness of breath and cough.  He is was hypoxic in triage and placed on 2 L of oxygen.  He had a fever today of 101 and continues to have a fever in triage at 100.4.  He actually says he has been short of breath for a while but got worse.  In the last 2 days.  He noticed a fever today.  He had knee replacement 2 days ago.  He is having pain in the left knee with some swelling and redness.  He denies any pain with urination or nausea vomiting or diarrhea.  Denies any sick contacts.  No history of blood clots.  He has been taking aspirin.  He has a history of diabetes, CAD.  The history is provided by the patient.       Home Medications Prior to Admission medications   Medication Sig Start Date End Date Taking? Authorizing Provider  amLODipine (NORVASC) 5 MG tablet TAKE ONE TABLET BY MOUTH DAILY Patient taking differently: Take 5 mg by mouth at bedtime. 01/01/22   Mliss Sax, MD  aspirin EC 81 MG tablet Take 1 tablet (81 mg total) by mouth 2 (two) times daily after a meal. Day after surgery 08/13/22   Jene Every, MD  cetirizine (ZYRTEC) 10 MG tablet Take 10 mg by mouth daily.    [provider]  Cholecalciferol (VITAMIN D) 50 MCG (2000 UT) tablet Take 2,000 Units by mouth daily.    [provider]  citalopram (CELEXA) 20 MG tablet ( GENERIC FOR CELEXA) TAKE ONE AND ONE-HALF TABLETS BY MOUTH DAILY 08/06/22   Mliss Sax, MD  cyanocobalamin (VITAMIN B12) 1000 MCG tablet Take 1,000 mcg by mouth daily.    [provider]  docusate sodium (COLACE) 100 MG capsule Take 1 capsule (100 mg total) by mouth 2 (two) times  daily as needed for mild constipation. 08/13/22   Jene Every, MD  ezetimibe (ZETIA) 10 MG tablet TAKE ONE TABLET BY MOUTH DAILY Patient taking differently: Take 10 mg by mouth at bedtime. 04/29/22   Lennette Bihari, MD  fluticasone (FLONASE) 50 MCG/ACT nasal spray Place 1 spray into both nostrils daily.    [provider]  hydrochlorothiazide (MICROZIDE) 12.5 MG capsule TAKE 2 CAPSULES EVERY OTHER DAY ALTERNATING WITH TAKE 1 CAPSULE EVERY OTHER DAY AS DIRECTED 06/30/22   Lennette Bihari, MD  HYDROcodone-acetaminophen (NORCO) 10-325 MG tablet Take 1 tablet by mouth every 4 (four) hours as needed for up to 7 days for severe pain. 08/13/22 08/20/22  Jene Every, MD  metFORMIN (GLUCOPHAGE-XR) 500 MG 24 hr tablet TAKE ONE TABLET BY MOUTH EVERY DAY WITH BREAKFAST 05/02/22   Mliss Sax, MD  methocarbamol (ROBAXIN) 500 MG tablet Take 1 tablet (500 mg total) by mouth every 8 (eight) hours as needed for muscle spasms. 08/13/22   Jene Every, MD  metoprolol tartrate (LOPRESSOR) 25 MG tablet TAKE 1/2 TO 1 TABLET EVERY DAY  AS NEEDED (HEART PALPITATIONS). Patient taking differently: Take 25 mg by mouth at bedtime. 10/22/20   Lennette Bihari, MD  Multiple Vitamin (MULTIVITAMIN) tablet Take 1 tablet by mouth daily.  [provider]  nitroGLYCERIN (NITROSTAT) 0.4 MG SL tablet For chest pain, tightness, or pressure. While sitting, place 1 tablet under tongue. May be used every 5 minutes as needed, for up to 15 minutes. Do not use more than 3 tablets. 06/16/22   Lennette Bihari, MD  olmesartan (BENICAR) 20 MG tablet Take 1 tablet (20 mg total) by mouth daily. Patient taking differently: Take 20 mg by mouth at bedtime. 06/05/22   Mliss Sax, MD  omeprazole (PRILOSEC OTC) 20 MG tablet Take 20 mg by mouth at bedtime.    [provider]  oxymetazoline (AFRIN) 0.05 % nasal spray Place 1 spray into both nostrils at bedtime.    [provider]  polyethylene  glycol (MIRALAX / GLYCOLAX) 17 g packet Take 17 g by mouth daily. 08/13/22   Jene Every, MD  pyridOXINE (VITAMIN B6) 100 MG tablet Take 100 mg by mouth daily.    [provider]  tadalafil (CIALIS) 20 MG tablet May take one or two up to every other day as needed for ed. 08/27/20   Mliss Sax, MD  trolamine salicylate (ASPERCREME) 10 % cream Apply 1 Application topically as needed for muscle pain.    [provider]  vitamin C (ASCORBIC ACID) 500 MG tablet Take 500 mg by mouth daily.    [provider]      Allergies    Patient has no known allergies.    Review of Systems   Review of Systems  Physical Exam Updated Vital Signs BP (!) 143/48 (BP Location: Right Arm)   Pulse 66   Temp (!) 100.4 F (38 C) (Oral)   Resp 18   Ht 5' 9.5" (1.765 m)   Wt 93.4 kg   SpO2 93%   BMI 29.98 kg/m  Physical Exam Vitals and nursing note reviewed.  Constitutional:      General: He is not in acute distress.    Appearance: He is well-developed. He is ill-appearing.  HENT:     Head: Normocephalic and atraumatic.  Eyes:     Conjunctiva/sclera: Conjunctivae normal.  Cardiovascular:     Rate and Rhythm: Normal rate and regular rhythm.     Pulses: Normal pulses.     Heart sounds: Normal heart sounds. No murmur heard. Pulmonary:     Effort: No respiratory distress.     Comments: Mildly tachypneic Abdominal:     Palpations: Abdomen is soft.     Tenderness: There is no abdominal tenderness.  Musculoskeletal:        General: Swelling and tenderness present.     Cervical back: Neck supple.     Comments: There is redness and swelling to the left knee but surgical suture sight does not have any obvious purulent drainage  Skin:    General: Skin is warm and dry.     Capillary Refill: Capillary refill takes less than 2 seconds.  Neurological:     General: No focal deficit present.     Mental Status: He is alert.  Psychiatric:        Mood and Affect: Mood  normal.     ED Results / Procedures / Treatments   Labs (all labs ordered are listed, but only abnormal results are displayed) Labs Reviewed  CULTURE, BLOOD (ROUTINE X 2)  CULTURE, BLOOD (ROUTINE X 2)  URINE CULTURE  RESP PANEL BY RT-PCR (RSV, FLU A&B, COVID)  [id number]  LACTIC ACID, PLASMA  LACTIC ACID, PLASMA  COMPREHENSIVE METABOLIC PANEL  CBC  WITH DIFFERENTIAL/PLATELET  PROTIME-INR  APTT  URINALYSIS, ROUTINE W REFLEX MICROSCOPIC  BRAIN NATRIURETIC PEPTIDE  TROPONIN I (HIGH SENSITIVITY)    EKG EKG Interpretation  Date/Time:  Friday August 15 2022 19:10:11 EDT Ventricular Rate:  65 PR Interval:  132 QRS Duration: 102 QT Interval:  428 QTC Calculation: 445 R Axis:   72 Text Interpretation: Sinus rhythm with occasional Premature ventricular complexes Cannot rule out Inferior infarct , age undetermined Abnormal ECG No previous ECGs available Confirmed by Virgina Norfolk [phone]) on 08/15/2022 8:14:18 PM  Radiology DG Chest Port 1 View  Result Date: 08/15/2022 CLINICAL DATA:  Possible sepsis. EXAM: PORTABLE CHEST 1 VIEW COMPARISON:  08/03/2020 FINDINGS: Low volume film. The cardio pericardial silhouette is enlarged. The lungs are clear without focal pneumonia, edema, pneumothorax or pleural effusion. No acute bony abnormality. IMPRESSION: No active disease. Electronically Signed   By: Kennith Center M.D.   On: 08/15/2022 20:04   DG Knee 1-2 Views Left  Result Date: 08/14/2022 CLINICAL DATA:  Status post left total knee replacement. EXAM: LEFT KNEE - 1-2 VIEW COMPARISON:  None Available. FINDINGS: The left femoral and tibial components are well situated. Expected postoperative changes are noted in the soft tissues anteriorly. IMPRESSION: Status post left total knee arthroplasty. Electronically Signed   By: Lupita Raider M.D.   On: 08/14/2022 08:30    Procedures Procedures  {Document cardiac monitor, telemetry assessment procedure when appropriate:1}  Medications Ordered in  ED Medications - No data to display  ED Course/ Medical Decision Making/ A&P   {   Click here for ABCD2, HEART and other calculatorsREFRESH Note before signing :1}                          Medical Decision Making Amount and/or Complexity of Data Reviewed Labs: ordered. Radiology: ordered. ECG/medicine tests: ordered.   [name] is here with shortness of breath and cough and fever.  Patient febrile and hypoxic in triage.  Placed on 2 L of oxygen.  History of CAD.  Had knee replacement 2 days ago and was discharged yesterday.  He had redness and swelling to the left knee but I suspect that is fairly consistent with having surgery 2 days ago.  He has been on aspirin.  He has not had a blood clot before.  Differential diagnosis could be pneumonia/sepsis/PE/less likely ACS.  Will cast broad workup with infectious workup including CT scan of his chest to evaluate for PE.  He does have new hypoxia.  Does not have respiratory history.  Not sure if this is may be some atelectasis from recent surgery.  ***  This chart was dictated using voice recognition software.  Despite best efforts to proofread,  errors can occur which can change the documentation meaning.   {Document critical care time when appropriate:1} {Document review of labs and clinical decision tools ie heart score, Chads2Vasc2 etc:1}  {Document your independent review of radiology images, and any outside records:1} {Document your discussion with family members, caretakers, and with consultants:1} {Document social determinants of health affecting pt's care:1} {Document your decision making why or why not admission, treatments were needed:1} Final Clinical Impression(s) / ED Diagnoses Final diagnoses:  None    Rx / DC Orders ED Discharge Orders     None

## 2022-08-15 NOTE — Discharge Summary (Signed)
Physician Discharge Summary   Patient ID: Dakota Gibbs MRN: [id number] DOB/AGE: 74-Jan-1952 74 y.o.  Admit date: 08/13/2022 Discharge date: 08/14/2022  Primary Diagnosis: primary left knee osteoarthritis  Admission Diagnoses:  Past Medical History:  Diagnosis Date   Allergy    enviornmental   Anxiety    Anxiety and depression 10/25/2012   Arthritis    self dx   Back pain    L4-L5 bulging disc, L5-S1 - bulging disc, pinched nerve in neck   Cataract    bilateral sx   Coronary artery disease    hx of balloon angioplasty with DR Bishop Limbo   Depression    Diabetes mellitus    pt denies   History of shingles 2009   Hyperlipidemia    on meds   Hypertension    on meds   Myocardial infarction 1997   caused by a blood clot   Sleep apnea    cpap   Discharge Diagnoses:   Principal Problem:   S/P TKR (total knee replacement) using cement, left  Estimated body mass index is 29.98 kg/m as calculated from the following:   Height as of this encounter: 5' 9.5" (1.765 m).   Weight as of this encounter: 93.4 kg.  Procedure:  Procedure(s) (LRB): TOTAL KNEE ARTHROPLASTY (Left)   Consults: None  HPI: see H&P Laboratory Data: Admission on 08/13/2022, Discharged on 08/14/2022  Component Date Value Ref Range Status   Glucose-Capillary 08/13/2022 120 (H)  70 - 99 mg/dL Final   Glucose reference range applies only to samples taken after fasting for at least 8 hours.   Glucose-Capillary 08/13/2022 141 (H)  70 - 99 mg/dL Final   Glucose reference range applies only to samples taken after fasting for at least 8 hours.   Glucose-Capillary 08/13/2022 150 (H)  70 - 99 mg/dL Final   Glucose reference range applies only to samples taken after fasting for at least 8 hours.   Glucose-Capillary 08/13/2022 131 (H)  70 - 99 mg/dL Final   Glucose reference range applies only to samples taken after fasting for at least 8 hours.   WBC 08/14/2022 10.0  4.0 - 10.5 K/uL Final   RBC 08/14/2022 3.93  (L)  4.22 - 5.81 MIL/uL Final   Hemoglobin 08/14/2022 12.3 (L)  13.0 - 17.0 g/dL Final   HCT 40/98/1191 37.5 (L)  39.0 - 52.0 % Final   MCV 08/14/2022 95.4  80.0 - 100.0 fL Final   MCH 08/14/2022 31.3  26.0 - 34.0 pg Final   MCHC 08/14/2022 32.8  30.0 - 36.0 g/dL Final   RDW 47/82/9562 13.2  11.5 - 15.5 % Final   Platelets 08/14/2022 220  150 - 400 K/uL Final   nRBC 08/14/2022 0.0  0.0 - 0.2 % Final   Neutrophils Relative % 08/14/2022 76  % Final   Neutro Abs 08/14/2022 7.5  1.7 - 7.7 K/uL Final   Lymphocytes Relative 08/14/2022 14  % Final   Lymphs Abs 08/14/2022 1.4  0.7 - 4.0 K/uL Final   Monocytes Relative 08/14/2022 10  % Final   Monocytes Absolute 08/14/2022 1.0  0.1 - 1.0 K/uL Final   Eosinophils Relative 08/14/2022 0  % Final   Eosinophils Absolute 08/14/2022 0.0  0.0 - 0.5 K/uL Final   Basophils Relative 08/14/2022 0  % Final   Basophils Absolute 08/14/2022 0.0  0.0 - 0.1 K/uL Final   Immature Granulocytes 08/14/2022 0  % Final   Abs Immature Granulocytes 08/14/2022 0.04  0.00 -  0.07 K/uL Final   Performed at [facility], [address], [place], [place] [address]   Sodium 08/14/2022 131 (L)  135 - 145 mmol/L Final   Potassium 08/14/2022 4.4  3.5 - 5.1 mmol/L Final   Chloride 08/14/2022 99  98 - 111 mmol/L Final   CO2 08/14/2022 25  22 - 32 mmol/L Final   Glucose, Bld 08/14/2022 156 (H)  70 - 99 mg/dL Final   Glucose reference range applies only to samples taken after fasting for at least 8 hours.   BUN 08/14/2022 16  8 - 23 mg/dL Final   Creatinine, Ser 08/14/2022 0.82  0.61 - 1.24 mg/dL Final   Calcium 52/84/1324 8.0 (L)  8.9 - 10.3 mg/dL Final   GFR, Estimated 08/14/2022 >60  >60 mL/min Final   Comment: (NOTE) Calculated using the CKD-EPI Creatinine Equation (2021)    Anion gap 08/14/2022 7  5 - 15 Final   Performed at [facility], [address] [place], [place] [address]   Glucose-Capillary 08/14/2022 164 (H)  70 - 99 mg/dL  Final   Glucose reference range applies only to samples taken after fasting for at least 8 hours.   Glucose-Capillary 08/14/2022 185 (H)  70 - 99 mg/dL Final   Glucose reference range applies only to samples taken after fasting for at least 8 hours.  Hospital Outpatient Visit on 08/06/2022  Component Date Value Ref Range Status   MRSA, PCR 08/06/2022 NEGATIVE  NEGATIVE Final   Staphylococcus aureus 08/06/2022 POSITIVE (A)  NEGATIVE Final   Comment: (NOTE) The Xpert SA Assay (FDA approved for NASAL specimens in patients 46 years of age and older), is one component of a comprehensive surveillance program. It is not intended to diagnose infection nor to guide or monitor treatment. Performed at [facility], [address] [place], [place] [address]    Hgb A1c MFr Bld 08/06/2022 6.5 (H)  4.8 - 5.6 % Final   Comment: (NOTE)         Prediabetes: 5.7 - 6.4         Diabetes: >6.4         Glycemic control for adults with diabetes: <7.0    Mean Plasma Glucose 08/06/2022 140  mg/dL Final   Comment: (NOTE) Performed At: [facility] [address] [place], [place] [id number] Jolene Schimke MD VZ:[id number]    Sodium 08/06/2022 136  135 - 145 mmol/L Final   Potassium 08/06/2022 4.6  3.5 - 5.1 mmol/L Final   Chloride 08/06/2022 103  98 - 111 mmol/L Final   CO2 08/06/2022 24  22 - 32 mmol/L Final   Glucose, Bld 08/06/2022 131 (H)  70 - 99 mg/dL Final   Glucose reference range applies only to samples taken after fasting for at least 8 hours.   BUN 08/06/2022 16  8 - 23 mg/dL Final   Creatinine, Ser 08/06/2022 0.97  0.61 - 1.24 mg/dL Final   Calcium 29/51/8841 9.0  8.9 - 10.3 mg/dL Final   GFR, Estimated 08/06/2022 >60  >60 mL/min Final   Comment: (NOTE) Calculated using the CKD-EPI Creatinine Equation (2021)    Anion gap 08/06/2022 9  5 - 15 Final   Performed at [facility], [address] [place], [place] [address]   WBC 08/06/2022 6.3  4.0  - 10.5 K/uL Final   RBC 08/06/2022 4.25  4.22 - 5.81 MIL/uL Final   Hemoglobin 08/06/2022 13.1  13.0 - 17.0 g/dL Final  HCT 08/06/2022 40.6  39.0 - 52.0 % Final   MCV 08/06/2022 95.5  80.0 - 100.0 fL Final   MCH 08/06/2022 30.8  26.0 - 34.0 pg Final   MCHC 08/06/2022 32.3  30.0 - 36.0 g/dL Final   RDW 33/29/5188 13.3  11.5 - 15.5 % Final   Platelets 08/06/2022 223  150 - 400 K/uL Final   nRBC 08/06/2022 0.0  0.0 - 0.2 % Final   Performed at [facility], [address] [place], [place] [address]   Glucose-Capillary 08/06/2022 124 (H)  70 - 99 mg/dL Final   Glucose reference range applies only to samples taken after fasting for at least 8 hours.  Abstract on 08/06/2022  Component Date Value Ref Range Status   HM Diabetic Eye Exam 07/08/2022 No Retinopathy  No Retinopathy Final     X-Rays:DG Knee 1-2 Views Left  Result Date: 08/14/2022 CLINICAL DATA:  Status post left total knee replacement. EXAM: LEFT KNEE - 1-2 VIEW COMPARISON:  None Available. FINDINGS: The left femoral and tibial components are well situated. Expected postoperative changes are noted in the soft tissues anteriorly. IMPRESSION: Status post left total knee arthroplasty. Electronically Signed   By: Lupita Raider M.D.   On: 08/14/2022 08:30    EKG: Orders placed or performed in visit on 02/25/22   EKG 12-Lead     Hospital Course: Dakota Gibbs is a 72 y.o. who was admitted to [facility]. They were brought to the operating room on 08/13/2022 and underwent Procedure(s): TOTAL KNEE ARTHROPLASTY.  Patient tolerated the procedure well and was later transferred to the recovery room and then to the orthopaedic floor for postoperative care.  They were given PO and IV analgesics for pain control following their surgery.  They were given 24 hours of postoperative antibiotics of  Anti-infectives (From admission, onward)    Start     Dose/Rate Route Frequency Ordered Stop   08/13/22 1700   ceFAZolin (ANCEF) IVPB 2g/100 mL premix        2 g 200 mL/hr over 30 Minutes Intravenous Every 6 hours 08/13/22 1452 08/14/22 0103   08/13/22 0745  ceFAZolin (ANCEF) IVPB 2g/100 mL premix        2 g 200 mL/hr over 30 Minutes Intravenous On call to O.R. 08/13/22 6301 08/13/22 1049      and started on DVT prophylaxis in the form of Aspirin, TED hose, and SCDs .   PT and OT were ordered for total joint protocol.  Discharge planning consulted to help with postop disposition and equipment needs.  Patient had a good night on the evening of surgery.  They started to get up OOB with therapy on day one. Continued to work with therapy into day one.  By day one, the patient had progressed with therapy and meeting their goals.  Incision was healing well.  Patient was seen in rounds and was ready to go home.   Diet: Regular diet Activity:WBAT Follow-up:in 10-14 days Disposition - Home Discharged Condition: good   Discharge Instructions     Call MD / Call 911   Complete by: As directed    If you experience chest pain or shortness of breath, CALL 911 and be transported to the hospital emergency room.  If you develope a fever above 101 F, pus (white drainage) or increased drainage or redness at the wound, or calf pain, call your surgeon's office.   Constipation Prevention   Complete by: As directed  Drink plenty of fluids.  Prune juice may be helpful.  You may use a stool softener, such as Colace (over the counter) 100 mg twice a day.  Use MiraLax (over the counter) for constipation as needed.   Diet - low sodium heart healthy   Complete by: As directed    Driving restrictions   Complete by: As directed    No driving for 2 weeks   Increase activity slowly as tolerated   Complete by: As directed    Post-operative opioid taper instructions:   Complete by: As directed    POST-OPERATIVE OPIOID TAPER INSTRUCTIONS: It is important to wean off of your opioid medication as soon as possible. If you do  not need pain medication after your surgery it is ok to stop day one. Opioids include: Codeine, Hydrocodone(Norco, Vicodin), Oxycodone(Percocet, oxycontin) and hydromorphone amongst others.  Long term and even short term use of opiods can cause: Increased pain response Dependence Constipation Depression Respiratory depression And more.  Withdrawal symptoms can include Flu like symptoms Nausea, vomiting And more Techniques to manage these symptoms Hydrate well Eat regular healthy meals Stay active Use relaxation techniques(deep breathing, meditating, yoga) Do Not substitute Alcohol to help with tapering If you have been on opioids for less than two weeks and do not have pain than it is ok to stop all together.  Plan to wean off of opioids This plan should start within one week post op of your joint replacement. Maintain the same interval or time between taking each dose and first decrease the dose.  Cut the total daily intake of opioids by one tablet each day Next start to increase the time between doses. The last dose that should be eliminated is the evening dose.         Allergies as of 08/14/2022   No Known Allergies      Medication List     STOP taking these medications    acetaminophen 650 MG CR tablet Commonly known as: TYLENOL   atorvastatin 40 MG tablet Commonly known as: LIPITOR   ibuprofen 200 MG tablet Commonly known as: ADVIL   naproxen sodium 220 MG tablet Commonly known as: ALEVE   vitamin E 180 MG (400 UNITS) capsule       TAKE these medications    amLODipine 5 MG tablet Commonly known as: NORVASC TAKE ONE TABLET BY MOUTH DAILY What changed: when to take this   ascorbic acid 500 MG tablet Commonly known as: VITAMIN C Take 500 mg by mouth daily.   aspirin EC 81 MG tablet Take 1 tablet (81 mg total) by mouth 2 (two) times daily after a meal. Day after surgery What changed:  when to take this additional instructions   cetirizine 10  MG tablet Commonly known as: ZYRTEC Take 10 mg by mouth daily.   citalopram 20 MG tablet Commonly known as: CELEXA ( GENERIC FOR CELEXA) TAKE ONE AND ONE-HALF TABLETS BY MOUTH DAILY   cyanocobalamin 1000 MCG tablet Commonly known as: VITAMIN B12 Take 1,000 mcg by mouth daily.   docusate sodium 100 MG capsule Commonly known as: Colace Take 1 capsule (100 mg total) by mouth 2 (two) times daily as needed for mild constipation.   ezetimibe 10 MG tablet Commonly known as: ZETIA TAKE ONE TABLET BY MOUTH DAILY What changed: when to take this   fluticasone 50 MCG/ACT nasal spray Commonly known as: FLONASE Place 1 spray into both nostrils daily.   hydrochlorothiazide 12.5 MG capsule Commonly known as:  MICROZIDE TAKE 2 CAPSULES EVERY OTHER DAY ALTERNATING WITH TAKE 1 CAPSULE EVERY OTHER DAY AS DIRECTED   HYDROcodone-acetaminophen 10-325 MG tablet Commonly known as: Norco Take 1 tablet by mouth every 4 (four) hours as needed for up to 7 days for severe pain.   metFORMIN 500 MG 24 hr tablet Commonly known as: GLUCOPHAGE-XR TAKE ONE TABLET BY MOUTH EVERY DAY WITH BREAKFAST   methocarbamol 500 MG tablet Commonly known as: ROBAXIN Take 1 tablet (500 mg total) by mouth every 8 (eight) hours as needed for muscle spasms.   metoprolol tartrate 25 MG tablet Commonly known as: LOPRESSOR TAKE 1/2 TO 1 TABLET EVERY DAY  AS NEEDED (HEART PALPITATIONS). What changed: See the new instructions.   multivitamin tablet Take 1 tablet by mouth daily.   nitroGLYCERIN 0.4 MG SL tablet Commonly known as: NITROSTAT For chest pain, tightness, or pressure. While sitting, place 1 tablet under tongue. May be used every 5 minutes as needed, for up to 15 minutes. Do not use more than 3 tablets.   olmesartan 20 MG tablet Commonly known as: BENICAR Take 1 tablet (20 mg total) by mouth daily. What changed: when to take this   omeprazole 20 MG tablet Commonly known as: PRILOSEC OTC Take 20 mg by mouth  at bedtime.   oxymetazoline 0.05 % nasal spray Commonly known as: AFRIN Place 1 spray into both nostrils at bedtime.   polyethylene glycol 17 g packet Commonly known as: MIRALAX / GLYCOLAX Take 17 g by mouth daily.   pyridOXINE 100 MG tablet Commonly known as: VITAMIN B6 Take 100 mg by mouth daily.   tadalafil 20 MG tablet Commonly known as: CIALIS May take one or two up to every other day as needed for ed.   trolamine salicylate 10 % cream Commonly known as: ASPERCREME Apply 1 Application topically as needed for muscle pain.   Vitamin D 50 MCG (2000 UT) tablet Take 2,000 Units by mouth daily.        Follow-up Information     Jene Every, MD Follow up in 2 week(s).   Specialty: Orthopedic Surgery Contact information: [address] [place] [address] [phone]                 Signed: Andrez Grime, PA-C Orthopaedic Surgery 08/15/2022, 12:38 PM

## 2022-08-16 ENCOUNTER — Emergency Department (HOSPITAL_COMMUNITY): Payer: Medicare Other

## 2022-08-16 ENCOUNTER — Observation Stay (HOSPITAL_COMMUNITY): Payer: Medicare Other

## 2022-08-16 ENCOUNTER — Other Ambulatory Visit (HOSPITAL_COMMUNITY): Payer: Medicare Other

## 2022-08-16 ENCOUNTER — Observation Stay (HOSPITAL_BASED_OUTPATIENT_CLINIC_OR_DEPARTMENT_OTHER): Payer: Medicare Other

## 2022-08-16 DIAGNOSIS — M7989 Other specified soft tissue disorders: Secondary | ICD-10-CM | POA: Diagnosis not present

## 2022-08-16 DIAGNOSIS — R0609 Other forms of dyspnea: Secondary | ICD-10-CM

## 2022-08-16 DIAGNOSIS — E1142 Type 2 diabetes mellitus with diabetic polyneuropathy: Secondary | ICD-10-CM | POA: Diagnosis not present

## 2022-08-16 DIAGNOSIS — I10 Essential (primary) hypertension: Secondary | ICD-10-CM | POA: Diagnosis not present

## 2022-08-16 DIAGNOSIS — R0602 Shortness of breath: Secondary | ICD-10-CM | POA: Diagnosis not present

## 2022-08-16 DIAGNOSIS — R5082 Postprocedural fever: Secondary | ICD-10-CM | POA: Diagnosis present

## 2022-08-16 DIAGNOSIS — E871 Hypo-osmolality and hyponatremia: Secondary | ICD-10-CM | POA: Insufficient documentation

## 2022-08-16 LAB — BASIC METABOLIC PANEL
Anion gap: 11 (ref 5–15)
BUN: 11 mg/dL (ref 8–23)
CO2: 24 mmol/L (ref 22–32)
Calcium: 8.2 mg/dL — ABNORMAL LOW (ref 8.9–10.3)
Chloride: 93 mmol/L — ABNORMAL LOW (ref 98–111)
Creatinine, Ser: 0.84 mg/dL (ref 0.61–1.24)
GFR, Estimated: >60 mL/min (ref >60)
Glucose, Bld: 131 mg/dL — ABNORMAL HIGH (ref 70–99)
Potassium: 3.7 mmol/L (ref 3.5–5.1)
Sodium: 128 mmol/L — ABNORMAL LOW (ref 135–145)

## 2022-08-16 LAB — CBC WITH DIFFERENTIAL/PLATELET
Abs Immature Granulocytes: 0.05 10*3/uL (ref 0.00–0.07)
Basophils Absolute: 0 10*3/uL (ref 0.0–0.1)
Basophils Relative: 0 %
Eosinophils Absolute: 0.1 10*3/uL (ref 0.0–0.5)
Eosinophils Relative: 1 %
HCT: 28.7 % — ABNORMAL LOW (ref 39.0–52.0)
Hemoglobin: 10.1 g/dL — ABNORMAL LOW (ref 13.0–17.0)
Immature Granulocytes: 1 %
Lymphocytes Relative: 11 %
Lymphs Abs: 1.2 10*3/uL (ref 0.7–4.0)
MCH: 32.4 pg (ref 26.0–34.0)
MCHC: 35.2 g/dL (ref 30.0–36.0)
MCV: 92 fL (ref 80.0–100.0)
Monocytes Absolute: 1 10*3/uL (ref 0.1–1.0)
Monocytes Relative: 9 %
Neutro Abs: 8.7 10*3/uL — ABNORMAL HIGH (ref 1.7–7.7)
Neutrophils Relative %: 78 %
Platelets: 180 10*3/uL (ref 150–400)
RBC: 3.12 MIL/uL — ABNORMAL LOW (ref 4.22–5.81)
RDW: 13.2 % (ref 11.5–15.5)
WBC: 11 10*3/uL — ABNORMAL HIGH (ref 4.0–10.5)
nRBC: 0 % (ref 0.0–0.2)

## 2022-08-16 LAB — LACTIC ACID, PLASMA
Lactic Acid, Venous: 1 mmol/L (ref 0.5–1.9)
Lactic Acid, Venous: 1.2 mmol/L (ref 0.5–1.9)

## 2022-08-16 LAB — ECHOCARDIOGRAM COMPLETE
Area-P 1/2: 3.05 cm2
Height: 69.5 in
S' Lateral: 3.5 cm
Weight: 3296 oz

## 2022-08-16 LAB — OSMOLALITY: Osmolality: 271 mOsm/kg — ABNORMAL LOW (ref 275–295)

## 2022-08-16 LAB — GLUCOSE, CAPILLARY
Glucose-Capillary: 161 mg/dL — ABNORMAL HIGH (ref 70–99)
Glucose-Capillary: 125 mg/dL — ABNORMAL HIGH (ref 70–99)
Glucose-Capillary: 133 mg/dL — ABNORMAL HIGH (ref 70–99)
Glucose-Capillary: 117 mg/dL — ABNORMAL HIGH (ref 70–99)

## 2022-08-16 LAB — TROPONIN I (HIGH SENSITIVITY): Troponin I (High Sensitivity): 14 ng/L (ref <18)

## 2022-08-16 LAB — PROCALCITONIN: Procalcitonin: 0.19 ng/mL

## 2022-08-16 LAB — MAGNESIUM: Magnesium: 1.9 mg/dL (ref 1.7–2.4)

## 2022-08-16 MED ORDER — METOPROLOL TARTRATE 25 MG PO TABS
25.0000 mg | ORAL_TABLET | Freq: Every day | ORAL | Status: DC
  Administered 2022-08-16 – 2022-08-17 (×2): 25 mg via ORAL
  Filled 2022-08-16 (×2): qty 1

## 2022-08-16 MED ORDER — PERFLUTREN LIPID MICROSPHERE
1.0000 mL | INTRAVENOUS | Status: AC | PRN
  Administered 2022-08-16: 3 mL via INTRAVENOUS

## 2022-08-16 MED ORDER — LACTATED RINGERS IV SOLN
150.0000 mL/h | INTRAVENOUS | Status: DC
Start: 2022-08-16 — End: 2022-08-16

## 2022-08-16 MED ORDER — PIPERACILLIN-TAZOBACTAM 3.375 G IVPB 30 MIN
3.3750 g | Freq: Once | INTRAVENOUS | Status: AC
  Administered 2022-08-16: 3.375 g via INTRAVENOUS
  Filled 2022-08-16: qty 50

## 2022-08-16 MED ORDER — VANCOMYCIN HCL IN DEXTROSE 1-5 GM/200ML-% IV SOLN
1000.0000 mg | Freq: Two times a day (BID) | INTRAVENOUS | Status: AC
  Administered 2022-08-16 – 2022-08-18 (×5): 1000 mg via INTRAVENOUS
  Filled 2022-08-16 (×5): qty 200

## 2022-08-16 MED ORDER — ACETAMINOPHEN 325 MG PO TABS
650.0000 mg | ORAL_TABLET | Freq: Four times a day (QID) | ORAL | Status: DC | PRN
  Administered 2022-08-16: 650 mg via ORAL
  Filled 2022-08-16: qty 2

## 2022-08-16 MED ORDER — VANCOMYCIN HCL IN DEXTROSE 1-5 GM/200ML-% IV SOLN
1000.0000 mg | Freq: Once | INTRAVENOUS | Status: AC
  Administered 2022-08-16: 1000 mg via INTRAVENOUS
  Filled 2022-08-16: qty 200

## 2022-08-16 MED ORDER — ENOXAPARIN SODIUM 40 MG/0.4ML IJ SOSY
40.0000 mg | PREFILLED_SYRINGE | INTRAMUSCULAR | Status: DC
  Administered 2022-08-16 – 2022-08-19 (×4): 40 mg via SUBCUTANEOUS
  Filled 2022-08-16 (×4): qty 0.4

## 2022-08-16 MED ORDER — INSULIN ASPART 100 UNIT/ML IJ SOLN
0.0000 [IU] | INTRAMUSCULAR | Status: DC
  Administered 2022-08-16: 2 [IU] via SUBCUTANEOUS
  Administered 2022-08-16 – 2022-08-17 (×6): 1 [IU] via SUBCUTANEOUS
  Administered 2022-08-18: 2 [IU] via SUBCUTANEOUS
  Administered 2022-08-18: 3 [IU] via SUBCUTANEOUS
  Administered 2022-08-18 (×2): 2 [IU] via SUBCUTANEOUS
  Administered 2022-08-18 (×2): 1 [IU] via SUBCUTANEOUS
  Administered 2022-08-19: 2 [IU] via SUBCUTANEOUS
  Administered 2022-08-19: 1 [IU] via SUBCUTANEOUS
  Administered 2022-08-19: 2 [IU] via SUBCUTANEOUS
  Administered 2022-08-19: 1 [IU] via SUBCUTANEOUS

## 2022-08-16 MED ORDER — LORATADINE 10 MG PO TABS
10.0000 mg | ORAL_TABLET | Freq: Every day | ORAL | Status: DC
  Administered 2022-08-16 – 2022-08-19 (×4): 10 mg via ORAL
  Filled 2022-08-16 (×4): qty 1

## 2022-08-16 MED ORDER — OXYMETAZOLINE HCL 0.05 % NA SOLN
1.0000 | Freq: Every day | NASAL | Status: AC
  Administered 2022-08-16 – 2022-08-18 (×3): 1 via NASAL
  Filled 2022-08-16: qty 30

## 2022-08-16 MED ORDER — EZETIMIBE 10 MG PO TABS
10.0000 mg | ORAL_TABLET | Freq: Every day | ORAL | Status: DC
  Administered 2022-08-16 – 2022-08-18 (×3): 10 mg via ORAL
  Filled 2022-08-16 (×3): qty 1

## 2022-08-16 MED ORDER — IOHEXOL 350 MG/ML SOLN
75.0000 mL | Freq: Once | INTRAVENOUS | Status: AC | PRN
  Administered 2022-08-16: 75 mL via INTRAVENOUS

## 2022-08-16 MED ORDER — FLUTICASONE PROPIONATE 50 MCG/ACT NA SUSP
1.0000 | Freq: Every day | NASAL | Status: DC
  Administered 2022-08-16 – 2022-08-19 (×4): 1 via NASAL
  Filled 2022-08-16: qty 16

## 2022-08-16 MED ORDER — PIPERACILLIN-TAZOBACTAM 3.375 G IVPB
3.3750 g | Freq: Three times a day (TID) | INTRAVENOUS | Status: AC
  Administered 2022-08-16 – 2022-08-18 (×8): 3.375 g via INTRAVENOUS
  Filled 2022-08-16 (×8): qty 50

## 2022-08-16 MED ORDER — CITALOPRAM HYDROBROMIDE 20 MG PO TABS
30.0000 mg | ORAL_TABLET | Freq: Every day | ORAL | Status: DC
  Administered 2022-08-16 – 2022-08-19 (×4): 30 mg via ORAL
  Filled 2022-08-16 (×4): qty 2

## 2022-08-16 MED ORDER — ACETAMINOPHEN 650 MG RE SUPP: 650.0000 mg | Freq: Four times a day (QID) | RECTAL | Status: DC | PRN

## 2022-08-16 MED ORDER — POLYETHYLENE GLYCOL 3350 17 G PO PACK
17.0000 g | PACK | Freq: Every day | ORAL | Status: DC
  Administered 2022-08-16 – 2022-08-19 (×3): 17 g via ORAL
  Filled 2022-08-16 (×4): qty 1

## 2022-08-16 MED ORDER — VITAMIN D 25 MCG (1000 UNIT) PO TABS
2000.0000 [IU] | ORAL_TABLET | Freq: Every day | ORAL | Status: DC
  Administered 2022-08-16 – 2022-08-19 (×4): 2000 [IU] via ORAL
  Filled 2022-08-16 (×4): qty 2

## 2022-08-16 MED ORDER — METHOCARBAMOL 500 MG PO TABS
500.0000 mg | ORAL_TABLET | Freq: Three times a day (TID) | ORAL | Status: DC | PRN
  Administered 2022-08-17 – 2022-08-19 (×2): 500 mg via ORAL
  Filled 2022-08-16 (×3): qty 1

## 2022-08-16 MED ORDER — FUROSEMIDE 10 MG/ML IJ SOLN
20.0000 mg | Freq: Two times a day (BID) | INTRAMUSCULAR | Status: DC
  Administered 2022-08-16 – 2022-08-18 (×4): 20 mg via INTRAVENOUS
  Filled 2022-08-16 (×4): qty 2

## 2022-08-16 MED ORDER — VITAMIN B-12 1000 MCG PO TABS
1000.0000 ug | ORAL_TABLET | Freq: Every day | ORAL | Status: DC
  Administered 2022-08-16 – 2022-08-19 (×4): 1000 ug via ORAL
  Filled 2022-08-16 (×4): qty 1

## 2022-08-16 MED ORDER — LACTATED RINGERS IV BOLUS
1000.0000 mL | Freq: Once | INTRAVENOUS | Status: AC
  Administered 2022-08-16: 1000 mL via INTRAVENOUS

## 2022-08-16 MED ORDER — ONDANSETRON HCL 4 MG/2ML IJ SOLN: 4.0000 mg | Freq: Four times a day (QID) | INTRAMUSCULAR | Status: DC | PRN

## 2022-08-16 MED ORDER — OMEPRAZOLE 20 MG PO CPDR
20.0000 mg | DELAYED_RELEASE_CAPSULE | Freq: Every day | ORAL | Status: DC
  Administered 2022-08-16 – 2022-08-18 (×3): 20 mg via ORAL
  Filled 2022-08-16 (×4): qty 1

## 2022-08-16 MED ORDER — TRAMADOL HCL 50 MG PO TABS
100.0000 mg | ORAL_TABLET | Freq: Four times a day (QID) | ORAL | Status: DC | PRN
  Administered 2022-08-16 – 2022-08-19 (×7): 100 mg via ORAL
  Filled 2022-08-16 (×8): qty 2

## 2022-08-16 MED ORDER — ONDANSETRON HCL 4 MG PO TABS: 4.0000 mg | ORAL_TABLET | Freq: Four times a day (QID) | ORAL | Status: DC | PRN

## 2022-08-16 NOTE — H&P (Addendum)
History and Physical    Patient: Dakota Gibbs:[id number] DOB: Oct 29, 1950 DOA: 08/15/2022 DOS: the patient was seen and examined on 08/16/2022 PCP: Mliss Sax, MD  Patient coming from: Home  Chief Complaint:  Chief Complaint  Patient presents with   Post-op Problem   HPI: Dakota Gibbs is a 72 y.o. male with medical history significant of DM2, HTN.  Pt underwent L TKA x2 days ago.  Pt in to ED with c/o generalized weakness, cough, SOB.  Pt hypoxic in triage and needing 2L O2.  Fever today of 101 at home, was 100.4 here in triage.  Pain in L knee with some redness and swelling.  No dysuria, no N/V/D, no sick contacts, no h/o blood clots.   Review of Systems: As mentioned in the history of present illness. All other systems reviewed and are negative. Past Medical History:  Diagnosis Date   Allergy    enviornmental   Anxiety    Anxiety and depression 10/25/2012   Arthritis    self dx   Back pain    L4-L5 bulging disc, L5-S1 - bulging disc, pinched nerve in neck   Cataract    bilateral sx   Coronary artery disease    hx of balloon angioplasty with DR Bishop Limbo   Depression    Diabetes mellitus    pt denies   History of shingles 2009   Hyperlipidemia    on meds   Hypertension    on meds   Myocardial infarction 1997   caused by a blood clot   Sleep apnea    cpap   Past Surgical History:  Procedure Laterality Date   ANGIOPLASTY  1997   BACK SURGERY  04/02/2022   basel cell     skin carcinoma removed x 4   BILATERAL CARPAL TUNNEL RELEASE     CARDIAC CATHETERIZATION     10/04/1999   COLONOSCOPY  2018   SA-MAC-suprep(good)-TA   DOPPLER ECHOCARDIOGRAPHY  07/09/2006   EF 50 to 55 %, LA mildy dilated   HERNIA REPAIR  1951   RIGHT   KNEE ARTHROSCOPY Right 2001   R   NM MYOCAR PERF WALL MOTION  08/22/2011   Mets 13,low risk study   POLYPECTOMY  2018   TA   TOTAL KNEE ARTHROPLASTY Left 08/13/2022   Procedure: TOTAL KNEE ARTHROPLASTY;   Surgeon: Jene Every, MD;  Location: [facility];  Service: Orthopedics;  Laterality: Left;   WRIST SURGERY  1984   right   Social History:  reports that he quit smoking about 1 years ago. His smoking use included cigars. He has never used smokeless tobacco. He reports current alcohol use of about 21.0 standard drinks of alcohol per week. He reports that he does not use drugs.  No Known Allergies  Family History  Problem Relation Age of Onset   Stroke Mother        TIA   COPD Mother    Hypertension Sister    Prostate cancer Father 36   Coronary artery disease Father        GM   Diabetes Brother    Prostate cancer Brother 4   Diabetes Maternal Grandmother    Colon cancer Neg Hx    Esophageal cancer Neg Hx    Colon polyps Neg Hx    Rectal cancer Neg Hx    Stomach cancer Neg Hx     Prior to Admission medications   Medication Sig Start Date End Date Taking? Authorizing  Provider  amLODipine (NORVASC) 5 MG tablet TAKE ONE TABLET BY MOUTH DAILY Patient taking differently: Take 5 mg by mouth at bedtime. 01/01/22   Mliss Sax, MD  aspirin EC 81 MG tablet Take 1 tablet (81 mg total) by mouth 2 (two) times daily after a meal. Day after surgery 08/13/22   Jene Every, MD  cetirizine (ZYRTEC) 10 MG tablet Take 10 mg by mouth daily.    [provider]  Cholecalciferol (VITAMIN D) 50 MCG (2000 UT) tablet Take 2,000 Units by mouth daily.    [provider]  citalopram (CELEXA) 20 MG tablet ( GENERIC FOR CELEXA) TAKE ONE AND ONE-HALF TABLETS BY MOUTH DAILY 08/06/22   Mliss Sax, MD  cyanocobalamin (VITAMIN B12) 1000 MCG tablet Take 1,000 mcg by mouth daily.    [provider]  docusate sodium (COLACE) 100 MG capsule Take 1 capsule (100 mg total) by mouth 2 (two) times daily as needed for mild constipation. 08/13/22   Jene Every, MD  ezetimibe (ZETIA) 10 MG tablet TAKE ONE TABLET BY MOUTH DAILY Patient taking differently: Take 10 mg by mouth at  bedtime. 04/29/22   Lennette Bihari, MD  fluticasone (FLONASE) 50 MCG/ACT nasal spray Place 1 spray into both nostrils daily.    [provider]  hydrochlorothiazide (MICROZIDE) 12.5 MG capsule TAKE 2 CAPSULES EVERY OTHER DAY ALTERNATING WITH TAKE 1 CAPSULE EVERY OTHER DAY AS DIRECTED 06/30/22   Lennette Bihari, MD  HYDROcodone-acetaminophen (NORCO) 10-325 MG tablet Take 1 tablet by mouth every 4 (four) hours as needed for up to 7 days for severe pain. 08/13/22 08/20/22  Jene Every, MD  metFORMIN (GLUCOPHAGE-XR) 500 MG 24 hr tablet TAKE ONE TABLET BY MOUTH EVERY DAY WITH BREAKFAST 05/02/22   Mliss Sax, MD  methocarbamol (ROBAXIN) 500 MG tablet Take 1 tablet (500 mg total) by mouth every 8 (eight) hours as needed for muscle spasms. 08/13/22   Jene Every, MD  metoprolol tartrate (LOPRESSOR) 25 MG tablet TAKE 1/2 TO 1 TABLET EVERY DAY  AS NEEDED (HEART PALPITATIONS). Patient taking differently: Take 25 mg by mouth at bedtime. 10/22/20   Lennette Bihari, MD  Multiple Vitamin (MULTIVITAMIN) tablet Take 1 tablet by mouth daily.    [provider]  nitroGLYCERIN (NITROSTAT) 0.4 MG SL tablet For chest pain, tightness, or pressure. While sitting, place 1 tablet under tongue. May be used every 5 minutes as needed, for up to 15 minutes. Do not use more than 3 tablets. 06/16/22   Lennette Bihari, MD  olmesartan (BENICAR) 20 MG tablet Take 1 tablet (20 mg total) by mouth daily. Patient taking differently: Take 20 mg by mouth at bedtime. 06/05/22   Mliss Sax, MD  omeprazole (PRILOSEC OTC) 20 MG tablet Take 20 mg by mouth at bedtime.    [provider]  oxymetazoline (AFRIN) 0.05 % nasal spray Place 1 spray into both nostrils at bedtime.    [provider]  polyethylene glycol (MIRALAX / GLYCOLAX) 17 g packet Take 17 g by mouth daily. 08/13/22   Jene Every, MD  pyridOXINE (VITAMIN B6) 100 MG tablet Take 100 mg by mouth daily.    [provider]  tadalafil (CIALIS) 20 MG tablet May take one or two up to every other day as needed for ed. 08/27/20   Mliss Sax, MD  trolamine salicylate (ASPERCREME) 10 % cream Apply 1 Application topically as needed for muscle pain.    [provider]  vitamin C (ASCORBIC ACID) 500 MG tablet Take 500 mg by mouth daily.    [provider]    Physical Exam: Vitals:   08/16/22 0200 08/16/22 0330 08/16/22 0339 08/16/22 0500  BP: (!) 125/46 (!) 150/50  (!) 154/67  Pulse: 64 (!) 58  62  Resp: 20 19  19   Temp:   98.4 F (36.9 C)   TempSrc:   Oral   SpO2: 98% 99%  96%  Weight:      Height:       Constitutional: NAD, calm, comfortable Respiratory: clear to auscultation bilaterally, no wheezing, no crackles. Normal respiratory effort. No accessory muscle use.  Cardiovascular: Regular rate and rhythm, no murmurs / rubs / gallops. No extremity edema. 2+ pedal pulses. No carotid bruits.  Abdomen: no tenderness, no masses palpated. No hepatosplenomegaly. Bowel sounds positive.  Skin: L knee swollen and slightly red.  No obvious purulent drainage however,  wound dressing C/D/I Neurologic: CN 2-12 grossly intact. Sensation intact, DTR normal. Strength 5/5 in all 4.  Psychiatric: Normal judgment and insight. Alert and oriented x 3. Normal mood.   Data Reviewed:        Latest Ref Rng & Units 08/15/2022    8:03 PM 08/14/2022    8:05 AM 08/06/2022    9:22 AM  CBC  WBC 4.0 - 10.5 K/uL 11.9  10.0  6.3   Hemoglobin 13.0 - 17.0 g/dL 46.9  62.9  52.8   Hematocrit 39.0 - 52.0 % 31.4  37.5  40.6   Platelets 150 - 400 K/uL 203  220  223       Latest Ref Rng & Units 08/15/2022    8:03 PM 08/14/2022    8:05 AM 08/06/2022    9:22 AM  CMP  Glucose 70 - 99 mg/dL 413  244  010   BUN 8 - 23 mg/dL 14  16  16    Creatinine 0.61 - 1.24 mg/dL 2.72  5.36  6.44   Sodium 135 - 145 mmol/L 126  131  136   Potassium 3.5 - 5.1 mmol/L 4.2  4.4  4.6   Chloride 98 - 111 mmol/L 91  99  103   CO2 22 -  32 mmol/L 24  25  24    Calcium 8.9 - 10.3 mg/dL 8.6  8.0  9.0   Total Protein 6.5 - 8.1 g/dL 6.2     Total Bilirubin 0.3 - 1.2 mg/dL 1.2     Alkaline Phos 38 - 126 U/L 54     AST 15 - 41 U/L 25     ALT 0 - 44 U/L 16      Urinalysis    Component Value Date/Time   COLORURINE YELLOW 08/15/2022 2050   APPEARANCEUR HAZY (A) 08/15/2022 2050   LABSPEC 1.020 08/15/2022 2050   PHURINE 5.0 08/15/2022 2050   GLUCOSEU NEGATIVE 08/15/2022 2050   GLUCOSEU NEGATIVE 06/02/2022 1035   HGBUR NEGATIVE 08/15/2022 2050   BILIRUBINUR NEGATIVE 08/15/2022 2050   BILIRUBINUR Neg 08/31/2012 1416   KETONESUR NEGATIVE 08/15/2022 2050   PROTEINUR 30 (A) 08/15/2022 2050   UROBILINOGEN 0.2 06/02/2022 1035   NITRITE NEGATIVE 08/15/2022 2050   LEUKOCYTESUR NEGATIVE 08/15/2022 2050   CTA chest: IMPRESSION: 1. No evidence of pulmonary embolism or other acute intrathoracic process. 2. Moderate severity coronary artery calcification. 3. Aortic atherosclerosis.   Aortic Atherosclerosis (ICD10-I70.0).  COVID, flu, RSV = negative  Lactate 1.4   Assessment and Plan: * Fever postop Fever of 100.4 +  WBC 11k, technically meets SIRS criteria. The questionable source of infection (if present) would be his knee. Not 100% clear that this is infected at this point though. EDP started empiric zosyn / vanc, ill continue this for now Sepsis pathway IVF: 1L LR bolus, will hold off on further IVF for the moment BCx pending Ortho to come and evaluate knee Other common causes worked up: CTA chest neg for PNA findings nor PE UA neg Will order DVT US of LLE Procalcitonin pending Tramadol (pt requested) PRN knee pain.  Essential hypertension Will hold home BP meds for the moment, at least until we are convinced he does not have sepsis.  DM2 (diabetes mellitus, type 2) Sensitive SSI Q4H for the moment, holding metformin.      Advance Care Planning:   Code Status: Full Code  Consults: EDP d/w emerge  ortho  Family Communication: No family in room  Severity of Illness: The appropriate patient status for this patient is OBSERVATION. Observation status is judged to be reasonable and necessary in order to provide the required intensity of service to ensure the patient's safety. The patient's presenting symptoms, physical exam findings, and initial radiographic and laboratory data in the context of their medical condition is felt to place them at decreased risk for further clinical deterioration. Furthermore, it is anticipated that the patient will be medically stable for discharge from the hospital within 2 midnights of admission.   Author: Hillary Bow., DO 08/16/2022 6:53 AM  For on call review [email or web address].

## 2022-08-16 NOTE — Assessment & Plan Note (Addendum)
Patient on presentation had presented with fever of 100.4 + WBC 11k, technically meets SIRS criteria. Unknown source of infection.  Concern for left knee infection on admission. Patient was placed on the sepsis pathway.   CT angiogram chest negative for PE or infiltrate. Lower extremity Dopplers negative for DVT. Urinalysis nitrite negative leukocytes negative. Blood cultures obtained pending with no growth to date x 4 days..   Patient initially empirically placed on IV vancomycin and IV Zosyn and received 3 days of IV antibiotics. Procalcitonin noted at 0.19. IV antibiotics were subsequently discontinued and patient remained afebrile, with a normal white count and remained in stable condition. Patient seen in consultation by orthopedics who assessed patient does not feel patient with an acute infection at this time and recommended outpatient follow-up with orthopedics, Dr. Shelle Iron this week. Pulmonary toileting/incentive spirometry ordered. Outpatient follow-up with orthopedics and PCP.

## 2022-08-16 NOTE — Progress Notes (Signed)
Left lower extremity venous study completed.   Preliminary results relayed to RN.  Please see CV Procedures for preliminary results.  Christene Lye, RVT  9:36 AM 08/16/22

## 2022-08-16 NOTE — Progress Notes (Signed)
*  PRELIMINARY RESULTS* Echocardiogram 2D Echocardiogram has been performed with Definity.  Stacey Drain 08/16/2022, 4:30 PM

## 2022-08-16 NOTE — Progress Notes (Signed)
Pharmacy Antibiotic Note  Dakota Gibbs is a 72 y.o. male admitted on 08/15/2022 with sepsis.  Pharmacy has been consulted for vancomycin and Zosyn dosing.  Plan: Vancomycin 1000mg  in ED; give additional 1g now for total load 2g, then 1000mg  IV Q12H. Goal AUC 400-550.  Expected AUC 450. Zosyn 3.375g IV Q8H (4-hour infusion).  Height: 5' 9.5" (176.5 cm) Weight: 93.4 kg (206 lb) IBW/kg (Calculated) : 71.85  Temp (24hrs), Avg:99.4 F (37.4 C), Min:98.4 F (36.9 C), Max:100.4 F (38 C)  Recent Labs  Lab 08/14/22 0805 08/15/22 2003  WBC 10.0 11.9*  CREATININE 0.82 0.91  LATICACIDVEN  --  1.4    Estimated Creatinine Clearance: 82.3 mL/min (by C-G formula based on SCr of 0.91 mg/dL).    No Known Allergies   Thank you for allowing pharmacy to be a part of this patient's care.  Vernard Gambles, PharmD, BCPS  08/16/2022 6:48 AM

## 2022-08-16 NOTE — ED Provider Notes (Signed)
Care assumed from previous team.  Patient with shortness of breath, cough and fever and hypoxia.  He is 2 days status post left knee replacement  Concern for respiratory source of his fever.  Noted to have no pneumonia on chest x-ray.  Mild hyponatremia.  CT scan is negative for pulmonary embolism or pneumonia.  Possible atelectasis.  Dr. Lockie Mola discussed with Dr. Thomasena Edis of orthopedics who felt that patient's fever was unlikely from his knee.  The knee does appear to be mildly warm and erythematous.  Reduced range of motion.  Dr. Thomasena Edis and not recommend arthrocentesis at this soon postoperatively.   Broad-spectrum antibiotics initiated.  Plan observation admission overnight with orthopedic evaluation in the morning.  Discussed with Dr. Julian Reil.   Glynn Octave, MD 08/16/22 [phone]

## 2022-08-16 NOTE — Assessment & Plan Note (Signed)
Sensitive SSI Q4H for the moment, holding metformin.

## 2022-08-16 NOTE — Progress Notes (Signed)
I have seen and assessed patient and agree with Dr. [place] Service assessment and plan. Patient is a pleasant 72 year old gentleman history of diabetes, hypertension, status post recent left TKA 2 days prior to admission presented to the ED with generalized weakness, cough, shortness of breath.  Patient noted to have had a fever of 101 at home prior to admission however noted to be 100.4 in the ED.  CT angiogram chest negative for PE or pneumonia.  Lower extremity Dopplers ordered which was read for DVT.  Patient also states recently has been short of breath on exertion and having to sit down and rest.  Blood cultures obtained and pending.  Urinalysis unremarkable, urine cultures pending.  Patient admitted for postop fever with no source noted at this time.  Concern for possible left knee infection with recent left TKA.  Orthopedics consulted for further evaluation.  Check a 2D echo.  Lasix 20 mg IV every 12 hours.  No charge.

## 2022-08-16 NOTE — ED Notes (Signed)
ED TO INPATIENT HANDOFF REPORT  ED Nurse Name and Phone #: Efrain Sella RN   S Name/Age/Gender Dakota Gibbs 72 y.o. male Room/Bed: 008C/008C  Code Status   Code Status: Full Code  Home/SNF/Other Home Patient oriented to: self, place time and situation Is this baseline? Yes   Triage Complete: Triage complete  Chief Complaint Sepsis [A41.9]  Triage Note Pt BIB [facility] from home c/o [facility] and generalized weakness. Pt had left knee surgery last wednesday and was discharged yesterday. Per EMS pt was 88% on RA,pt placed on 2L. Pt states the [facility] is worse with exertion.    Allergies No Known Allergies  Level of Care/Admitting Diagnosis ED Disposition     ED Disposition  Admit   Condition  --   Comment  Hospital Area: [facility] [[id number]]  Level of Care: Med-Surg [16]  May place patient in observation at [facility] or [facility] if equivalent level of care is available:: No  Covid Evaluation: Asymptomatic - no recent exposure (last 10 days) testing not required  Diagnosis: Sepsis [1610960]  Admitting Physician: Hillary Bow [[id number]]  Attending Physician: Hillary Bow [[id number]]          B Medical/Surgery History Past Medical History:  Diagnosis Date   Allergy    enviornmental   Anxiety    Anxiety and depression 10/25/2012   Arthritis    self dx   Back pain    L4-L5 bulging disc, L5-S1 - bulging disc, pinched nerve in neck   Cataract    bilateral sx   Coronary artery disease    hx of balloon angioplasty with DR Bishop Limbo   Depression    Diabetes mellitus    pt denies   History of shingles 2009   Hyperlipidemia    on meds   Hypertension    on meds   Myocardial infarction 1997   caused by a blood clot   Sleep apnea    cpap   Past Surgical History:  Procedure Laterality Date   ANGIOPLASTY  1997   BACK SURGERY  04/02/2022   basel cell     skin carcinoma removed x 4   BILATERAL CARPAL TUNNEL RELEASE     CARDIAC CATHETERIZATION      10/04/1999   COLONOSCOPY  2018   SA-MAC-suprep(good)-TA   DOPPLER ECHOCARDIOGRAPHY  07/09/2006   EF 50 to 55 %, LA mildy dilated   HERNIA REPAIR  1951   RIGHT   KNEE ARTHROSCOPY Right 2001   R   NM MYOCAR PERF WALL MOTION  08/22/2011   Mets 13,low risk study   POLYPECTOMY  2018   TA   TOTAL KNEE ARTHROPLASTY Left 08/13/2022   Procedure: TOTAL KNEE ARTHROPLASTY;  Surgeon: Jene Every, MD;  Location: [facility];  Service: Orthopedics;  Laterality: Left;   WRIST SURGERY  1984   right     A IV Location/Drains/Wounds Patient Lines/Drains/Airways Status     Active Line/Drains/Airways     Name Placement date Placement time Site Days   Peripheral IV 08/15/22 20 G Right Antecubital 08/15/22  2113  Antecubital  1            Intake/Output Last 24 hours  Intake/Output Summary (Last 24 hours) at 08/16/2022 0640 Last data filed at 08/16/2022 0257 Gross per 24 hour  Intake 45.77 ml  Output 450 ml  Net -404.23 ml    Labs/Imaging Results for orders placed or performed during the hospital encounter of 08/15/22 (from the past  48 hour(s))  Resp panel by RT-PCR (RSV, Flu A&B, Covid)     Status: None   Collection Time: 08/15/22  7:42 PM   Specimen: Nasal Swab  Result Value Ref Range   SARS Coronavirus 2 by RT PCR NEGATIVE NEGATIVE   Influenza A by PCR NEGATIVE NEGATIVE   Influenza B by PCR NEGATIVE NEGATIVE    Comment: (NOTE) The Xpert Xpress SARS-CoV-2/FLU/RSV plus assay is intended as an aid in the diagnosis of influenza from Nasopharyngeal swab specimens and should not be used as a sole basis for treatment. Nasal washings and aspirates are unacceptable for Xpert Xpress SARS-CoV-2/FLU/RSV testing.  Fact Sheet for Patients: BloggerCourse.com  Fact Sheet for Healthcare Providers: SeriousBroker.it  This test is not yet approved or cleared by the [place] FDA and has been authorized for detection and/or diagnosis of  SARS-CoV-2 by FDA under an Emergency Use Authorization (EUA). This EUA will remain in effect (meaning this test can be used) for the duration of the COVID-19 declaration under Section 564(b)(1) of the Act, 21 U.S.C. section 360bbb-3(b)(1), unless the authorization is terminated or revoked.     Resp Syncytial Virus by PCR NEGATIVE NEGATIVE    Comment: (NOTE) Fact Sheet for Patients: BloggerCourse.com  Fact Sheet for Healthcare Providers: SeriousBroker.it  This test is not yet approved or cleared by the [place] FDA and has been authorized for detection and/or diagnosis of SARS-CoV-2 by FDA under an Emergency Use Authorization (EUA). This EUA will remain in effect (meaning this test can be used) for the duration of the COVID-19 declaration under Section 564(b)(1) of the Act, 21 U.S.C. section 360bbb-3(b)(1), unless the authorization is terminated or revoked.  Performed at [facility], [address], [place], [place] [address]   Lactic acid, plasma     Status: None   Collection Time: 08/15/22  8:03 PM  Result Value Ref Range   Lactic Acid, Venous 1.4 0.5 - 1.9 mmol/L    Comment: Performed at [facility], [address], [place], [place] [address]  Comprehensive metabolic panel     Status: Abnormal   Collection Time: 08/15/22  8:03 PM  Result Value Ref Range   Sodium 126 (L) 135 - 145 mmol/L   Potassium 4.2 3.5 - 5.1 mmol/L   Chloride 91 (L) 98 - 111 mmol/L   CO2 24 22 - 32 mmol/L   Glucose, Bld 132 (H) 70 - 99 mg/dL    Comment: Glucose reference range applies only to samples taken after fasting for at least 8 hours.   BUN 14 8 - 23 mg/dL   Creatinine, Ser 7.82 0.61 - 1.24 mg/dL   Calcium 8.6 (L) 8.9 - 10.3 mg/dL   Total Protein 6.2 (L) 6.5 - 8.1 g/dL   Albumin 2.9 (L) 3.5 - 5.0 g/dL   AST 25 15 - 41 U/L   ALT 16 0 - 44 U/L   Alkaline Phosphatase 54 38 - 126 U/L   Total Bilirubin 1.2 0.3 - 1.2 mg/dL    GFR, Estimated >95 >62 mL/min    Comment: (NOTE) Calculated using the CKD-EPI Creatinine Equation (2021)    Anion gap 11 5 - 15    Comment: Performed at [facility], [address], [place], [place] [address]  CBC with Differential     Status: Abnormal   Collection Time: 08/15/22  8:03 PM  Result Value Ref Range   WBC 11.9 (H) 4.0 - 10.5 K/uL   RBC 3.42 (L) 4.22 -  5.81 MIL/uL   Hemoglobin 10.9 (L) 13.0 - 17.0 g/dL   HCT 16.1 (L) 09.6 - 04.5 %   MCV 91.8 80.0 - 100.0 fL   MCH 31.9 26.0 - 34.0 pg   MCHC 34.7 30.0 - 36.0 g/dL   RDW 40.9 81.1 - 91.4 %   Platelets 203 150 - 400 K/uL   nRBC 0.0 0.0 - 0.2 %   Neutrophils Relative % 79 %   Neutro Abs 9.3 (H) 1.7 - 7.7 K/uL   Lymphocytes Relative 11 %   Lymphs Abs 1.4 0.7 - 4.0 K/uL   Monocytes Relative 10 %   Monocytes Absolute 1.2 (H) 0.1 - 1.0 K/uL   Eosinophils Relative 0 %   Eosinophils Absolute 0.0 0.0 - 0.5 K/uL   Basophils Relative 0 %   Basophils Absolute 0.0 0.0 - 0.1 K/uL   Immature Granulocytes 0 %   Abs Immature Granulocytes 0.05 0.00 - 0.07 K/uL    Comment: Performed at [facility], [address], [place], [place] [address]  Protime-INR     Status: None   Collection Time: 08/15/22  8:03 PM  Result Value Ref Range   Prothrombin Time 15.0 11.4 - 15.2 seconds   INR 1.2 0.8 - 1.2    Comment: (NOTE) INR goal varies based on device and disease states. Performed at [facility], [address] [place], [place] [address]   APTT     Status: None   Collection Time: 08/15/22  8:03 PM  Result Value Ref Range   aPTT 33 24 - 36 seconds    Comment: Performed at [facility], [address], [place], [place] [address]  Brain natriuretic peptide     Status: Abnormal   Collection Time: 08/15/22  8:03 PM  Result Value Ref Range   B Natriuretic Peptide 312.3 (H) 0.0 - 100.0 pg/mL    Comment: Performed at [facility], [address] [place], [place] [address]  Troponin I (High Sensitivity)      Status: None   Collection Time: 08/15/22  8:03 PM  Result Value Ref Range   Troponin I (High Sensitivity) 13 <18 ng/L    Comment: (NOTE) Elevated high sensitivity troponin I (hsTnI) values and significant  changes across serial measurements may suggest ACS but many other  chronic and acute conditions are known to elevate hsTnI results.  Refer to the "Links" section for chest pain algorithms and additional  guidance. Performed at [facility], [address], [place], [place] [address]   Urinalysis, Routine w reflex microscopic -Urine, Clean Catch     Status: Abnormal   Collection Time: 08/15/22  8:50 PM  Result Value Ref Range   Color, Urine YELLOW YELLOW   APPearance HAZY (A) CLEAR   Specific Gravity, Urine 1.020 1.005 - 1.030   pH 5.0 5.0 - 8.0   Glucose, UA NEGATIVE NEGATIVE mg/dL   Hgb urine dipstick NEGATIVE NEGATIVE   Bilirubin Urine NEGATIVE NEGATIVE   Ketones, ur NEGATIVE NEGATIVE mg/dL   Protein, ur 30 (A) NEGATIVE mg/dL   Nitrite NEGATIVE NEGATIVE   Leukocytes,Ua NEGATIVE NEGATIVE   RBC / HPF 0-5 0 - 5 RBC/hpf   WBC, UA 0-5 0 - 5 WBC/hpf   Bacteria, UA NONE SEEN NONE SEEN   Squamous Epithelial / HPF 0-5 0 - 5 /HPF   Mucus PRESENT    Hyaline Casts, UA PRESENT     Comment: Performed at [facility],  [address], [place], [place] [address]  Troponin I (High Sensitivity)     Status: None   Collection Time: 08/15/22 11:20 PM  Result Value Ref Range   Troponin I (High Sensitivity) 14 <18 ng/L    Comment: (NOTE) Elevated high sensitivity troponin I (hsTnI) values and significant  changes across serial measurements may suggest ACS but many other  chronic and acute conditions are known to elevate hsTnI results.  Refer to the "Links" section for chest pain algorithms and additional  guidance. Performed at [facility], [address] [place], [place] [address]    CT Angio Chest PE W and/or Wo Contrast  Result Date: 08/16/2022 CLINICAL DATA:   Suspected pulmonary embolism. EXAM: CT ANGIOGRAPHY CHEST WITH CONTRAST TECHNIQUE: Multidetector CT imaging of the chest was performed using the standard protocol during bolus administration of intravenous contrast. Multiplanar CT image reconstructions and MIPs were obtained to evaluate the vascular anatomy. RADIATION DOSE REDUCTION: This exam was performed according to the departmental dose-optimization program which includes automated exposure control, adjustment of the mA and/or kV according to patient size and/or use of iterative reconstruction technique. CONTRAST:  82mL OMNIPAQUE IOHEXOL 350 MG/ML SOLN COMPARISON:  None Available. FINDINGS: Cardiovascular: There is marked severity calcification of the aortic arch and descending thoracic aorta, without evidence of aortic aneurysm. Satisfactory opacification of the pulmonary arteries to the segmental level. No evidence of pulmonary embolism. Normal heart size with moderate severity coronary artery calcification. No pericardial effusion. Mediastinum/Nodes: No enlarged mediastinal, hilar, or axillary lymph nodes. Thyroid gland, trachea, and esophagus demonstrate no significant findings. Lungs/Pleura: Mild atelectasis is seen within the bilateral lung bases. There is no evidence of an acute infiltrate, pleural effusion or pneumothorax. Upper Abdomen: No acute abnormality. Musculoskeletal: No chest wall abnormality. No acute or significant osseous findings. Review of the MIP images confirms the above findings. IMPRESSION: 1. No evidence of pulmonary embolism or other acute intrathoracic process. 2. Moderate severity coronary artery calcification. 3. Aortic atherosclerosis. Aortic Atherosclerosis (ICD10-I70.0). Electronically Signed   By: Aram Candela M.D.   On: 08/16/2022 01:01   DG Chest Port 1 View  Result Date: 08/15/2022 CLINICAL DATA:  Possible sepsis. EXAM: PORTABLE CHEST 1 VIEW COMPARISON:  08/03/2020 FINDINGS: Low volume film. The cardio pericardial  silhouette is enlarged. The lungs are clear without focal pneumonia, edema, pneumothorax or pleural effusion. No acute bony abnormality. IMPRESSION: No active disease. Electronically Signed   By: Kennith Center M.D.   On: 08/15/2022 20:04   DG Knee 1-2 Views Left  Result Date: 08/14/2022 CLINICAL DATA:  Status post left total knee replacement. EXAM: LEFT KNEE - 1-2 VIEW COMPARISON:  None Available. FINDINGS: The left femoral and tibial components are well situated. Expected postoperative changes are noted in the soft tissues anteriorly. IMPRESSION: Status post left total knee arthroplasty. Electronically Signed   By: Lupita Raider M.D.   On: 08/14/2022 08:30    Pending Labs Unresulted Labs (From admission, onward)     Start     Ordered   08/17/22 0500  Protime-INR  Tomorrow morning,   R        08/16/22 0601   08/17/22 0500  Cortisol-am, blood  Tomorrow morning,   R        08/16/22 0601   08/17/22 0500  Procalcitonin  Tomorrow morning,   R       References:    Procalcitonin Lower Respiratory Tract Infection AND Sepsis Procalcitonin Algorithm   08/16/22 0601   08/17/22  0500  CBC  Tomorrow morning,   R        08/16/22 0601   08/17/22 0500  Comprehensive metabolic panel  Tomorrow morning,   R        08/16/22 0601   08/16/22 0635  Procalcitonin  Once,   R       References:    Procalcitonin Lower Respiratory Tract Infection AND Sepsis Procalcitonin Algorithm   08/16/22 0634   08/15/22 1941  Lactic acid, plasma  (Undifferentiated presentation (screening labs and basic nursing orders))  Now then every 2 hours,   R      08/15/22 1941   08/15/22 1941  Blood Culture (routine x 2)  (Undifferentiated presentation (screening labs and basic nursing orders))  BLOOD CULTURE X 2,   STAT      08/15/22 1941   08/15/22 1941  Urine Culture  (Undifferentiated presentation (screening labs and basic nursing orders))  Once,   URGENT       Question:  Indication  Answer:  Dysuria   08/15/22 1941             Vitals/Pain Today's Vitals   08/16/22 0200 08/16/22 0330 08/16/22 0339 08/16/22 0500  BP: (!) 125/46 (!) 150/50  (!) 154/67  Pulse: 64 (!) 58  62  Resp: Temp:   98.4 F (36.9 C)   TempSrc:   Oral   SpO2: 98% 99%  96%  Weight:      Height:      PainSc:        Isolation Precautions No active isolations  Medications Medications  enoxaparin (LOVENOX) injection 40 mg (has no administration in time range)  acetaminophen (TYLENOL) tablet 650 mg (has no administration in time range)    Or  acetaminophen (TYLENOL) suppository 650 mg (has no administration in time range)  ondansetron (ZOFRAN) tablet 4 mg (has no administration in time range)    Or  ondansetron (ZOFRAN) injection 4 mg (has no administration in time range)  vancomycin (VANCOCIN) IVPB 1000 mg/200 mL premix (has no administration in time range)  polyethylene glycol (MIRALAX / GLYCOLAX) packet 17 g (has no administration in time range)  omeprazole (PRILOSEC) capsule 20 mg (has no administration in time range)  ezetimibe (ZETIA) tablet 10 mg (has no administration in time range)  traMADol (ULTRAM) tablet 100 mg (has no administration in time range)  iohexol (OMNIPAQUE) 350 MG/ML injection 75 mL (75 mLs Intravenous Contrast Given 08/16/22 0043)  vancomycin (VANCOCIN) IVPB 1000 mg/200 mL premix (0 mg Intravenous Stopped 08/16/22 0415)  piperacillin-tazobactam (ZOSYN) IVPB 3.375 g (0 g Intravenous Stopped 08/16/22 0249)  lactated ringers bolus 1,000 mL (0 mLs Intravenous Stopped 08/16/22 0415)    Mobility walks with device, non weight bearing on left leg, transfers with walker and wheelchair     Focused Assessments Neuro Assessment Handoff:  Swallow screen pass? Yes          Neuro Assessment:   Neuro Checks:      Has TPA been given? No If patient is a Neuro Trauma and patient is going to OR before floor call report to 4N Charge nurse: [phone] or [phone]   R Recommendations: See Admitting  Provider Note  Report given to:   Additional Notes:

## 2022-08-16 NOTE — Progress Notes (Signed)
Total knee sx incision from 4/10 all other skin intact

## 2022-08-16 NOTE — Assessment & Plan Note (Signed)
Will hold home BP meds for the moment, at least until we are convinced he does not have sepsis.

## 2022-08-17 ENCOUNTER — Encounter: Payer: Self-pay | Admitting: Cardiovascular Disease

## 2022-08-17 DIAGNOSIS — Z87891 Personal history of nicotine dependence: Secondary | ICD-10-CM | POA: Diagnosis not present

## 2022-08-17 DIAGNOSIS — E876 Hypokalemia: Secondary | ICD-10-CM | POA: Diagnosis not present

## 2022-08-17 DIAGNOSIS — Z79899 Other long term (current) drug therapy: Secondary | ICD-10-CM | POA: Diagnosis not present

## 2022-08-17 DIAGNOSIS — E1141 Type 2 diabetes mellitus with diabetic mononeuropathy: Secondary | ICD-10-CM | POA: Diagnosis present

## 2022-08-17 DIAGNOSIS — R0902 Hypoxemia: Secondary | ICD-10-CM | POA: Diagnosis present

## 2022-08-17 DIAGNOSIS — I252 Old myocardial infarction: Secondary | ICD-10-CM | POA: Diagnosis not present

## 2022-08-17 DIAGNOSIS — A419 Sepsis, unspecified organism: Secondary | ICD-10-CM | POA: Diagnosis present

## 2022-08-17 DIAGNOSIS — Z823 Family history of stroke: Secondary | ICD-10-CM | POA: Diagnosis not present

## 2022-08-17 DIAGNOSIS — Z7982 Long term (current) use of aspirin: Secondary | ICD-10-CM | POA: Diagnosis not present

## 2022-08-17 DIAGNOSIS — E8770 Fluid overload, unspecified: Secondary | ICD-10-CM | POA: Diagnosis not present

## 2022-08-17 DIAGNOSIS — E669 Obesity, unspecified: Secondary | ICD-10-CM | POA: Diagnosis present

## 2022-08-17 DIAGNOSIS — E871 Hypo-osmolality and hyponatremia: Secondary | ICD-10-CM | POA: Diagnosis present

## 2022-08-17 DIAGNOSIS — Z7984 Long term (current) use of oral hypoglycemic drugs: Secondary | ICD-10-CM | POA: Diagnosis not present

## 2022-08-17 DIAGNOSIS — K219 Gastro-esophageal reflux disease without esophagitis: Secondary | ICD-10-CM

## 2022-08-17 DIAGNOSIS — Z1152 Encounter for screening for COVID-19: Secondary | ICD-10-CM | POA: Diagnosis not present

## 2022-08-17 DIAGNOSIS — G4733 Obstructive sleep apnea (adult) (pediatric): Secondary | ICD-10-CM | POA: Diagnosis present

## 2022-08-17 DIAGNOSIS — I10 Essential (primary) hypertension: Secondary | ICD-10-CM | POA: Diagnosis present

## 2022-08-17 DIAGNOSIS — T502X5A Adverse effect of carbonic-anhydrase inhibitors, benzothiadiazides and other diuretics, initial encounter: Secondary | ICD-10-CM | POA: Diagnosis not present

## 2022-08-17 DIAGNOSIS — Z96652 Presence of left artificial knee joint: Secondary | ICD-10-CM

## 2022-08-17 DIAGNOSIS — Z8249 Family history of ischemic heart disease and other diseases of the circulatory system: Secondary | ICD-10-CM | POA: Diagnosis not present

## 2022-08-17 DIAGNOSIS — E785 Hyperlipidemia, unspecified: Secondary | ICD-10-CM | POA: Diagnosis present

## 2022-08-17 DIAGNOSIS — R5082 Postprocedural fever: Secondary | ICD-10-CM | POA: Diagnosis present

## 2022-08-17 DIAGNOSIS — Z6829 Body mass index (BMI) 29.0-29.9, adult: Secondary | ICD-10-CM | POA: Diagnosis not present

## 2022-08-17 DIAGNOSIS — R0602 Shortness of breath: Secondary | ICD-10-CM | POA: Diagnosis not present

## 2022-08-17 DIAGNOSIS — I493 Ventricular premature depolarization: Secondary | ICD-10-CM | POA: Diagnosis not present

## 2022-08-17 DIAGNOSIS — I2510 Atherosclerotic heart disease of native coronary artery without angina pectoris: Secondary | ICD-10-CM | POA: Diagnosis present

## 2022-08-17 LAB — URINE CULTURE: Culture: NO GROWTH

## 2022-08-17 LAB — CBC WITH DIFFERENTIAL/PLATELET
Abs Immature Granulocytes: 0.04 10*3/uL (ref 0.00–0.07)
Basophils Absolute: 0.1 10*3/uL (ref 0.0–0.1)
Basophils Relative: 1 %
Eosinophils Absolute: 0.2 10*3/uL (ref 0.0–0.5)
Eosinophils Relative: 2 %
HCT: 29.6 % — ABNORMAL LOW (ref 39.0–52.0)
Hemoglobin: 10.5 g/dL — ABNORMAL LOW (ref 13.0–17.0)
Immature Granulocytes: 0 %
Lymphocytes Relative: 12 %
Lymphs Abs: 1.1 10*3/uL (ref 0.7–4.0)
MCH: 32.5 pg (ref 26.0–34.0)
MCHC: 35.5 g/dL (ref 30.0–36.0)
MCV: 91.6 fL (ref 80.0–100.0)
Monocytes Absolute: 1 10*3/uL (ref 0.1–1.0)
Monocytes Relative: 10 %
Neutro Abs: 7.5 10*3/uL (ref 1.7–7.7)
Neutrophils Relative %: 75 %
Platelets: 212 10*3/uL (ref 150–400)
RBC: 3.23 MIL/uL — ABNORMAL LOW (ref 4.22–5.81)
RDW: 13.3 % (ref 11.5–15.5)
WBC: 9.9 10*3/uL (ref 4.0–10.5)
nRBC: 0 % (ref 0.0–0.2)

## 2022-08-17 LAB — GLUCOSE, CAPILLARY
Glucose-Capillary: 117 mg/dL — ABNORMAL HIGH (ref 70–99)
Glucose-Capillary: 121 mg/dL — ABNORMAL HIGH (ref 70–99)
Glucose-Capillary: 127 mg/dL — ABNORMAL HIGH (ref 70–99)
Glucose-Capillary: 131 mg/dL — ABNORMAL HIGH (ref 70–99)
Glucose-Capillary: 143 mg/dL — ABNORMAL HIGH (ref 70–99)
Glucose-Capillary: 145 mg/dL — ABNORMAL HIGH (ref 70–99)

## 2022-08-17 LAB — COMPREHENSIVE METABOLIC PANEL
ALT: 18 U/L (ref 0–44)
AST: 24 U/L (ref 15–41)
Albumin: 2.4 g/dL — ABNORMAL LOW (ref 3.5–5.0)
Alkaline Phosphatase: 51 U/L (ref 38–126)
Anion gap: 8 (ref 5–15)
BUN: 8 mg/dL (ref 8–23)
CO2: 24 mmol/L (ref 22–32)
Calcium: 7.9 mg/dL — ABNORMAL LOW (ref 8.9–10.3)
Chloride: 96 mmol/L — ABNORMAL LOW (ref 98–111)
Creatinine, Ser: 0.91 mg/dL (ref 0.61–1.24)
GFR, Estimated: >60 mL/min (ref >60)
Glucose, Bld: 121 mg/dL — ABNORMAL HIGH (ref 70–99)
Potassium: 3.3 mmol/L — ABNORMAL LOW (ref 3.5–5.1)
Sodium: 128 mmol/L — ABNORMAL LOW (ref 135–145)
Total Bilirubin: 1.2 mg/dL (ref 0.3–1.2)
Total Protein: 5.7 g/dL — ABNORMAL LOW (ref 6.5–8.1)

## 2022-08-17 LAB — PROCALCITONIN: Procalcitonin: 0.19 ng/mL

## 2022-08-17 LAB — BRAIN NATRIURETIC PEPTIDE: B Natriuretic Peptide: 265.8 pg/mL — ABNORMAL HIGH (ref 0.0–100.0)

## 2022-08-17 LAB — PROTIME-INR
INR: 1.1 (ref 0.8–1.2)
Prothrombin Time: 14.6 seconds (ref 11.4–15.2)

## 2022-08-17 LAB — CULTURE, BLOOD (ROUTINE X 2): Culture: NO GROWTH

## 2022-08-17 LAB — CORTISOL-AM, BLOOD: Cortisol - AM: 11.1 ug/dL (ref 6.7–22.6)

## 2022-08-17 MED ORDER — POTASSIUM CHLORIDE CRYS ER 10 MEQ PO TBCR
40.0000 meq | EXTENDED_RELEASE_TABLET | Freq: Every day | ORAL | Status: DC
  Administered 2022-08-17 – 2022-08-19 (×3): 40 meq via ORAL
  Filled 2022-08-17 (×3): qty 4

## 2022-08-17 NOTE — Assessment & Plan Note (Signed)
-  Patient assessed by orthopedics. -PT/OT. -Outpatient follow-up with orthopedics.

## 2022-08-17 NOTE — Assessment & Plan Note (Signed)
-   Sepsis ruled out 

## 2022-08-17 NOTE — Consult Note (Signed)
Patient ID: Dakota Gibbs MRN: [id number] DOB/AGE: [birth year] 74 y.o.  Admit date: 08/15/2022  Admission Diagnoses:  Principal Problem:   Fever postop Active Problems:   DM2 (diabetes mellitus, type 2)   Essential hypertension   Hyponatremia   SOB (shortness of breath)   HPI: Ortho consult for postop check s/p left TKA (Dr. Shelle Iron - 08/13/22). Readmission for fevers and pain control. Pt has been stable on RNF during admission.  PMH notable for CAD (s/p balloon angioplasty), T2DM, HTN. Currently on IV abx by primary team.  Denies numbness/tingling. Notes no pain at rest, only with weightbearing. Accompanied by wife at bedside.  Past Medical History: Past Medical History:  Diagnosis Date   Allergy    enviornmental   Anxiety    Anxiety and depression 10/25/2012   Arthritis    self dx   Back pain    L4-L5 bulging disc, L5-S1 - bulging disc, pinched nerve in neck   Cataract    bilateral sx   Coronary artery disease    hx of balloon angioplasty with DR Bishop Limbo   Depression    Diabetes mellitus    pt denies   History of shingles 2009   Hyperlipidemia    on meds   Hypertension    on meds   Myocardial infarction 1997   caused by a blood clot   Sleep apnea    cpap    Surgical History: Past Surgical History:  Procedure Laterality Date   ANGIOPLASTY  1997   BACK SURGERY  04/02/2022   basel cell     skin carcinoma removed x 4   BILATERAL CARPAL TUNNEL RELEASE     CARDIAC CATHETERIZATION     10/04/1999   COLONOSCOPY  2018   SA-MAC-suprep(good)-TA   DOPPLER ECHOCARDIOGRAPHY  07/09/2006   EF 50 to 55 %, LA mildy dilated   HERNIA REPAIR  1951   RIGHT   KNEE ARTHROSCOPY Right 2001   R   NM MYOCAR PERF WALL MOTION  08/22/2011   Mets 13,low risk study   POLYPECTOMY  2018   TA   TOTAL KNEE ARTHROPLASTY Left 08/13/2022   Procedure: TOTAL KNEE ARTHROPLASTY;  Surgeon: Jene Every, MD;  Location: [facility];  Service: Orthopedics;  Laterality: Left;   WRIST  SURGERY  1984   right    Family History: Family History  Problem Relation Age of Onset   Stroke Mother        TIA   COPD Mother    Hypertension Sister    Prostate cancer Father 63   Coronary artery disease Father        GM   Diabetes Brother    Prostate cancer Brother 44   Diabetes Maternal Grandmother    Colon cancer Neg Hx    Esophageal cancer Neg Hx    Colon polyps Neg Hx    Rectal cancer Neg Hx    Stomach cancer Neg Hx     Social History: Social History   Socioeconomic History   Marital status: Married    Spouse name: Not on file   Number of children: 2   Years of education: Not on file   Highest education level: Not on file  Occupational History   Occupation: executive recruter   Tobacco Use   Smoking status: Former    Types: Cigars    Quit date: 09/2020    Years since quitting: 1.9   Smokeless tobacco: Never  Vaping Use   Vaping Use: Never  used  Substance and Sexual Activity   Alcohol use: Yes    Alcohol/week: 21.0 standard drinks of alcohol    Types: 21 Glasses of wine per week    Comment: 2 glasses wine daily   Drug use: No   Sexual activity: Yes  Other Topics Concern   Not on file  Social History Narrative   Lives w/ wife      Right Handed    Lives in a two story home   Drinks two cups of coffee daily      Social Determinants of Health   Financial Resource Strain: Not on file  Food Insecurity: No Food Insecurity (08/16/2022)   Hunger Vital Sign    Worried About Running Out of Food in the Last Year: Never true    Ran Out of Food in the Last Year: Never true  Transportation Needs: No Transportation Needs (08/16/2022)   PRAPARE - Administrator, Civil Service (Medical): No    Lack of Transportation (Non-Medical): No  Physical Activity: Not on file  Stress: Not on file  Social Connections: Not on file  Intimate Partner Violence: Not At Risk (08/16/2022)   Humiliation, Afraid, Rape, and Kick questionnaire    Fear of Current or  Ex-Partner: No    Emotionally Abused: No    Physically Abused: No    Sexually Abused: No    Allergies: Patient has no known allergies.  Medications: I have reviewed the patient's current medications.  Vital Signs: Patient Vitals for the past 24 hrs:  BP Temp Temp src Pulse Resp SpO2  08/17/22 0717 -- 98 F (36.7 C) -- -- -- --  08/17/22 0443 -- -- -- (!) 58 16 96 %  08/17/22 0442 -- -- -- 60 19 96 %  08/17/22 0441 -- -- -- 60 19 96 %  08/17/22 0440 -- -- -- (!) 59 19 96 %  08/17/22 0439 -- -- -- 61 18 96 %  08/17/22 0438 -- -- -- 63 19 96 %  08/17/22 0437 -- -- -- 61 16 97 %  08/17/22 0436 -- -- -- 60 18 96 %  08/17/22 0435 -- -- -- 60 17 97 %  08/17/22 0434 (!) 146/63 98.5 F (36.9 C) Oral 63 19 96 %  08/17/22 0433 -- -- -- (!) 57 20 96 %  08/17/22 0432 -- -- -- 61 18 96 %  08/17/22 0431 -- -- -- 60 18 96 %  08/17/22 0430 -- -- -- 61 15 96 %  08/17/22 0429 -- -- -- 60 18 94 %  08/17/22 0428 -- -- -- 60 14 96 %  08/17/22 0427 -- -- -- (!) 59 18 96 %  08/17/22 0426 -- -- -- 60 18 96 %  08/17/22 0425 -- -- -- 62 16 96 %  08/17/22 0424 -- -- -- (!) 59 18 96 %  08/17/22 0423 -- -- -- 60 19 95 %  08/17/22 0422 -- -- -- 60 19 95 %  08/17/22 0421 -- -- -- (!) 58 18 97 %  08/17/22 0420 -- -- -- 63 18 98 %  08/17/22 0419 -- -- -- (!) 59 19 99 %  08/17/22 0418 -- -- -- (!) 58 14 98 %  08/17/22 0417 -- -- -- (!) 59 17 98 %  08/17/22 0416 -- -- -- (!) 59 18 98 %  08/17/22 0415 -- -- -- 60 20 97 %  08/17/22 0414 -- -- -- (!) 59 16 97 %  08/17/22 0413 -- -- -- 60 18 98 %  08/17/22 0412 -- -- -- (!) 57 16 97 %  08/17/22 0411 -- -- -- (!) 56 15 96 %  08/17/22 0410 -- -- -- (!) 58 18 96 %  08/17/22 0409 -- -- -- 61 16 98 %  08/17/22 0408 -- -- -- (!) 59 17 96 %  08/17/22 0407 -- -- -- 62 18 97 %  08/17/22 0406 -- -- -- (!) 59 16 96 %  08/17/22 0405 -- -- -- 60 20 96 %  08/17/22 0404 -- -- -- 61 18 96 %  08/17/22 0403 -- -- -- 60 16 97 %  08/17/22 0402 -- -- -- 60 16 96 %   08/17/22 0401 -- -- -- (!) 59 17 95 %  08/17/22 0400 -- -- -- 62 18 96 %  08/17/22 0359 -- -- -- 61 19 96 %  08/17/22 0358 -- -- -- 60 17 96 %  08/17/22 0357 -- -- -- (!) 59 19 97 %  08/17/22 0356 -- -- -- (!) 59 (!) 27 96 %  08/17/22 0355 -- -- -- (!) 59 17 97 %  08/17/22 0354 -- -- -- (!) 59 19 97 %  08/17/22 0353 -- -- -- (!) 59 19 97 %  08/17/22 0352 -- -- -- (!) 59 16 97 %  08/17/22 0351 -- -- -- (!) 59 20 95 %  08/17/22 0350 -- -- -- (!) 58 18 96 %  08/17/22 0349 -- -- -- (!) 58 14 96 %  08/17/22 0348 -- -- -- 61 19 96 %  08/17/22 0347 -- -- -- (!) 58 19 96 %  08/17/22 0346 -- -- -- (!) 59 17 96 %  08/17/22 0345 -- -- -- (!) 59 18 96 %  08/17/22 0344 -- -- -- (!) 59 20 96 %  08/17/22 0343 -- -- -- (!) 59 17 95 %  08/17/22 0342 -- -- -- 61 (!) 21 97 %  08/17/22 0341 -- -- -- 62 19 95 %  08/17/22 0340 -- -- -- (!) 58 18 96 %  08/17/22 0339 -- -- -- 60 17 94 %  08/17/22 0338 -- -- -- 61 19 97 %  08/17/22 0337 -- -- -- 60 17 97 %  08/17/22 0336 -- -- -- (!) 58 18 97 %  08/17/22 0335 -- -- -- (!) 57 17 96 %  08/17/22 0334 -- -- -- 61 18 96 %  08/17/22 0333 -- -- -- (!) 58 15 97 %  08/17/22 0332 -- -- -- 60 17 96 %  08/17/22 0331 -- -- -- (!) 59 17 96 %  08/17/22 0330 -- -- -- (!) 57 19 97 %  08/17/22 0329 -- -- -- (!) 58 18 99 %  08/17/22 0328 -- -- -- (!) 56 18 95 %  08/17/22 0327 -- -- -- (!) 58 19 96 %  08/17/22 0326 -- -- -- (!) 57 18 96 %  08/17/22 0325 -- -- -- (!) 58 16 96 %  08/17/22 0324 -- -- -- (!) 58 16 96 %  08/17/22 0323 -- -- -- (!) 57 17 98 %  08/17/22 0322 -- -- -- (!) 57 17 98 %  08/17/22 0321 -- -- -- (!) 57 18 97 %  08/17/22 0320 -- -- -- (!) 59 19 97 %  08/17/22 0319 -- -- -- (!) 58 17 96 %  08/17/22 0318 -- -- -- 60 20 96 %    08/17/22 0317 -- -- -- (!) 58 17 97 %  08/17/22 0316 -- -- -- (!) 58 18 97 %  08/17/22 0315 -- -- -- (!) 57 17 96 %  08/17/22 0314 -- -- -- 60 17 97 %  08/17/22 0313 -- -- -- (!) 59 20 96 %  08/17/22 0312 -- -- -- (!) 57  19 97 %  08/17/22 0311 -- -- -- (!) 58 18 98 %  08/17/22 0310 -- -- -- (!) 59 19 99 %  08/17/22 0309 -- -- -- (!) 59 17 97 %  08/17/22 0308 -- -- -- (!) 58 19 99 %  08/17/22 0307 -- -- -- (!) 59 16 99 %  08/17/22 0306 -- -- -- (!) 58 18 99 %  08/17/22 0305 -- -- -- (!) 58 18 99 %  08/17/22 0304 -- -- -- (!) 57 17 99 %  08/17/22 0303 -- -- -- (!) 59 20 99 %  08/17/22 0302 -- -- -- (!) 57 19 98 %  08/17/22 0301 -- -- -- (!) 56 20 99 %  08/17/22 0300 -- -- -- (!) 58 17 98 %  08/17/22 0259 -- -- -- (!) 59 16 99 %  08/17/22 0258 -- -- -- (!) 59 20 98 %  08/17/22 0257 -- -- -- 60 19 98 %  08/17/22 0256 -- -- -- (!) 57 17 99 %  08/17/22 0255 -- -- -- (!) 57 17 98 %  08/17/22 0254 -- -- -- (!) 59 18 98 %  08/17/22 0253 -- -- -- (!) 58 19 98 %  08/17/22 0252 -- -- -- (!) 59 18 98 %  08/17/22 0251 -- -- -- (!) 57 17 98 %  08/17/22 0250 -- -- -- (!) 56 18 98 %  08/17/22 0249 -- -- -- (!) 58 18 98 %  08/17/22 0248 -- -- -- (!) 57 19 98 %  08/17/22 0247 -- -- -- (!) 58 19 97 %  08/17/22 0246 -- -- -- (!) 59 16 98 %  08/17/22 0245 -- -- -- (!) 59 19 98 %  08/17/22 0244 -- -- -- (!) 58 16 98 %  08/17/22 0243 -- -- -- (!) 59 18 98 %  08/17/22 0242 -- -- -- (!) 59 18 97 %  08/17/22 0241 -- -- -- (!) 59 18 98 %  08/17/22 0240 -- -- -- (!) 59 17 97 %  08/17/22 0239 -- -- -- (!) 59 14 97 %  08/17/22 0238 -- -- -- (!) 58 20 97 %  08/17/22 0237 -- -- -- 61 18 97 %  08/17/22 0236 -- -- -- (!) 58 17 97 %  08/17/22 0235 -- -- -- 62 17 97 %  08/17/22 0234 -- -- -- 60 17 97 %  08/17/22 0233 -- -- -- (!) 59 (!) 23 97 %  08/17/22 0232 -- -- -- 62 16 97 %  08/17/22 0231 -- -- -- 62 18 97 %  08/17/22 0230 -- -- -- 61 16 98 %  08/17/22 0229 -- -- -- 63 20 98 %  08/17/22 0228 -- -- -- 61 20 97 %  08/17/22 0227 -- -- -- (!) 59 18 97 %  08/17/22 0226 -- -- -- 61 17 97 %  08/17/22 0225 -- -- -- 61 19 95 %  08/17/22 0224 -- -- -- 66 15 97 %  08/17/22 0223 -- -- -- 71 (!) 24 95 %  08/17/22 0222 -- -- --    69 19 98 %  08/17/22 0221 -- -- -- 64 20 98 %  08/17/22 0220 -- -- -- 62 20 97 %  08/17/22 0219 -- -- -- 61 19 97 %  08/17/22 0218 -- -- -- (!) 58 18 97 %  08/17/22 0217 -- -- -- (!) 59 (!) 21 96 %  08/17/22 0216 -- -- -- 61 15 99 %  08/17/22 0215 -- -- -- (!) 59 19 98 %  08/17/22 0214 -- -- -- (!) 58 18 99 %  08/17/22 0213 -- -- -- (!) 58 (!) 21 98 %  08/17/22 0212 -- -- -- (!) 58 17 97 %  08/17/22 0211 (!) 147/46 -- -- (!) 59 19 98 %  08/17/22 0210 -- -- -- (!) 58 13 99 %  08/17/22 0209 -- -- -- (!) 59 17 98 %  08/17/22 0208 -- -- -- 61 17 98 %  08/17/22 0207 -- -- -- 61 19 98 %  08/17/22 0206 -- -- -- (!) 59 19 98 %  08/17/22 0205 -- -- -- (!) 59 19 98 %  08/17/22 0204 -- -- -- (!) 57 19 98 %  08/17/22 0203 -- -- -- (!) 58 20 99 %  08/17/22 0202 -- -- -- (!) 57 17 99 %  08/17/22 0201 -- -- -- (!) 57 19 97 %  08/17/22 0200 -- -- -- (!) 59 17 99 %  08/17/22 0159 -- -- -- (!) 59 19 98 %  08/17/22 0158 -- -- -- (!) 59 19 98 %  08/17/22 0157 -- -- -- (!) 57 19 98 %  08/17/22 0156 -- -- -- (!) 59 16 99 %  08/17/22 0155 -- -- -- (!) 59 18 98 %  08/17/22 0154 -- -- -- (!) 58 17 98 %  08/17/22 0153 -- -- -- (!) 59 20 97 %  08/17/22 0152 -- -- -- (!) 58 19 99 %  08/17/22 0151 -- -- -- 60 19 99 %  08/17/22 0150 -- -- -- (!) 58 18 97 %  08/17/22 0149 -- -- -- (!) 59 17 97 %  08/17/22 0148 -- -- -- (!) 57 16 97 %  08/17/22 0147 -- -- -- (!) 59 17 98 %  08/17/22 0146 -- -- -- (!) 58 18 98 %  08/17/22 0145 -- -- -- (!) 58 19 98 %  08/17/22 0144 -- -- -- (!) 58 17 98 %  08/17/22 0143 -- -- -- 60 18 98 %  08/17/22 0142 -- -- -- (!) 58 18 98 %  08/17/22 0141 -- -- -- (!) 58 18 98 %  08/17/22 0140 -- -- -- (!) 58 17 98 %  08/17/22 0139 -- -- -- 60 18 97 %  08/17/22 0138 -- -- -- 61 18 98 %  08/17/22 0137 -- -- -- (!) 58 14 98 %  08/17/22 0136 -- -- -- (!) 58 19 98 %  08/17/22 0135 -- -- -- (!) 57 (!) 21 99 %  08/17/22 0134 -- -- -- (!) 59 18 98 %  08/17/22 0133 -- -- -- (!) 59 19 98  %  08/17/22 0132 -- -- -- (!) 58 19 98 %  08/17/22 0131 -- -- -- (!) 59 19 99 %  08/17/22 0130 -- -- -- 60 17 98 %  08/17/22 0129 -- -- -- 62 18 96 %  08/17/22 0128 -- -- -- 66 (!) 21 94 %  08/17/22 0127 -- -- -- 69 (!) 29   94 %  08/17/22 0126 -- -- -- 71 19 94 %  08/17/22 0125 -- -- -- 70 (!) 22 97 %  08/17/22 0124 -- -- -- 63 (!) 21 94 %  08/17/22 0123 -- -- -- (!) 59 19 94 %  08/17/22 0122 -- -- -- (!) 59 (!) 21 95 %  08/17/22 0121 -- -- -- (!) 59 20 95 %  08/17/22 0120 -- -- -- 61 19 95 %  08/17/22 0119 -- -- -- (!) 58 (!) 23 95 %  08/17/22 0118 -- -- -- (!) 59 16 96 %  08/17/22 0117 -- -- -- (!) 55 19 96 %  08/17/22 0116 -- -- -- 63 17 97 %  08/17/22 0115 -- -- -- (!) 59 18 96 %  08/17/22 0114 -- -- -- (!) 59 20 95 %  08/17/22 0113 -- -- -- (!) 59 19 95 %  08/17/22 0112 -- -- -- (!) 58 19 96 %  08/17/22 0111 -- -- -- (!) 58 17 95 %  08/17/22 0110 -- -- -- 60 (!) 22 96 %  08/17/22 0109 -- -- -- 60 17 95 %  08/17/22 0108 -- -- -- 61 19 95 %  08/17/22 0107 -- -- -- 60 20 96 %  08/17/22 0106 -- -- -- (!) 58 17 94 %  08/17/22 0105 -- -- -- (!) 59 20 94 %  08/17/22 0104 -- -- -- (!) 58 17 96 %  08/17/22 0103 -- -- -- (!) 59 16 95 %  08/17/22 0102 -- -- -- 61 19 95 %  08/17/22 0101 -- -- -- (!) 59 12 93 %  08/17/22 0100 (!) 141/62 98.8 F (37.1 C) Oral 61 19 94 %  08/17/22 0059 -- -- -- 61 18 92 %  08/17/22 0058 -- -- -- 62 15 (!) 87 %  08/17/22 0057 -- -- -- 61 19 (!) 87 %  08/17/22 0056 -- -- -- 61 18 90 %  08/17/22 0055 -- -- -- (!) 56 14 90 %  08/17/22 0054 -- -- -- (!) 58 15 90 %  08/17/22 0053 -- -- -- 62 18 (!) 85 %  08/17/22 0052 -- -- -- 62 (!) 22 90 %  08/17/22 0051 -- -- -- 61 (!) 21 90 %  08/17/22 0050 -- -- -- 62 19 90 %  08/17/22 0049 -- -- -- 60 17 (!) 89 %  08/17/22 0048 -- -- -- 61 16 (!) 88 %  08/17/22 0047 -- -- -- (!) 59 20 91 %  08/17/22 0046 -- -- -- 62 (!) 21 (!) 88 %  08/17/22 0045 -- -- -- 60 18 (!) 89 %  08/17/22 0044 -- -- -- 62 (!) 21 (!) 84  %  08/17/22 0043 -- -- -- 61 19 92 %  08/17/22 0042 -- -- -- 61 20 (!) 89 %  08/17/22 0041 -- -- -- 62 17 90 %  08/17/22 0040 -- -- -- 63 20 (!) 86 %  08/17/22 0039 -- -- -- 62 16 90 %  08/17/22 0038 -- -- -- 61 18 91 %  08/17/22 0037 -- -- -- 62 (!) 21 (!) 88 %  08/17/22 0036 -- -- -- 61 17 90 %  08/17/22 0035 -- -- -- 61 13 90 %  08/17/22 0034 -- -- -- 63 20 (!) 88 %  08/17/22 0033 -- -- -- 65 19 90 %  08/17/22 0032 -- -- -- 61 (!) 21 (!) 87 %  08/17/22 0031 -- -- -- 63 15 (!) 85 %  08/17/22 0030 -- -- -- 62 20 (!) 88 %  08/17/22 0029 -- -- -- 62 18 (!) 87 %  08/17/22 0028 -- -- -- 62 20 (!) 87 %  08/17/22 0027 -- -- -- 63 19 (!) 87 %  08/17/22 0026 -- -- -- 65 18 (!) 88 %  08/17/22 0025 -- -- -- 63 (!) 21 (!) 86 %  08/17/22 0024 -- -- -- 65 (!) 21 (!) 86 %  08/17/22 0023 -- -- -- 64 15 (!) 88 %  08/17/22 0022 -- -- -- 64 18 (!) 88 %  08/17/22 0021 -- -- -- 65 (!) 23 (!) 88 %  08/17/22 0020 -- -- -- 63 17 (!) 87 %  08/17/22 0019 -- -- -- 62 16 90 %  08/17/22 0018 -- -- -- 63 19 (!) 86 %  08/17/22 0017 -- -- -- 62 19 (!) 89 %  08/17/22 0016 -- -- -- 63 (!) 21 (!) 89 %  08/17/22 0015 -- -- -- 63 19 (!) 88 %  08/17/22 0014 -- -- -- 63 (!) 21 90 %  08/17/22 0013 -- -- -- 65 (!) 21 (!) 89 %  08/17/22 0012 -- -- -- 66 19 90 %  08/17/22 0011 -- -- -- 64 18 (!) 86 %  08/17/22 0010 -- -- -- 65 19 (!) 88 %  08/17/22 0009 -- -- -- 64 19 (!) 89 %  08/17/22 0008 -- -- -- 66 20 91 %  08/17/22 0007 -- -- -- 66 18 90 %  08/17/22 0006 -- -- -- 67 19 93 %  08/17/22 0005 -- -- -- 67 18 90 %  08/17/22 0004 -- -- -- 66 19 90 %  08/17/22 0003 -- -- -- 68 15 (!) 89 %  08/17/22 0002 -- -- -- 66 (!) 22 92 %  08/17/22 0001 -- -- -- 67 15 90 %  08/17/22 0000 -- -- -- 66 18 92 %  08/16/22 2359 -- -- -- 66 17 93 %  08/16/22 2358 -- -- -- 67 18 94 %  08/16/22 2357 -- -- -- 66 16 93 %  08/16/22 2356 -- -- -- 67 13 95 %  08/16/22 2355 -- -- -- 67 20 95 %  08/16/22 2354 -- -- -- 68 17 95 %   08/16/22 2353 -- -- -- 69 19 95 %  08/16/22 2352 -- -- -- 69 19 98 %  08/16/22 2351 -- -- -- 68 17 97 %  08/16/22 2350 -- -- -- 69 17 96 %  08/16/22 2349 -- -- -- 69 (!) 21 97 %  08/16/22 2348 -- -- -- 70 18 97 %  08/16/22 2347 -- -- -- 68 18 97 %  08/16/22 2346 -- -- -- 69 19 97 %  08/16/22 2345 -- -- -- 67 (!) 21 97 %  08/16/22 2344 -- -- -- 72 16 97 %  08/16/22 2343 -- -- -- 69 (!) 22 97 %  08/16/22 2342 -- -- -- 70 20 96 %  08/16/22 2341 -- -- -- 71 19 97 %  08/16/22 2340 -- -- -- 71 18 97 %  08/16/22 2339 -- -- -- 72 20 97 %  08/16/22 2338 -- -- -- 72 16 97 %  08/16/22 2337 -- -- -- 72 16 97 %  08/16/22 2336 -- -- -- 71 18 97 %  08/16/22 2335 -- -- -- 70 20 97 %    08/16/22 2334 -- -- -- 73 19 97 %  08/16/22 2333 -- -- -- 73 20 96 %  08/16/22 2332 -- -- -- 75 (!) 21 96 %  08/16/22 2331 -- -- -- 71 (!) 22 97 %  08/16/22 2330 -- -- -- 72 18 97 %  08/16/22 2329 -- -- -- 74 (!) 21 97 %  08/16/22 2328 -- -- -- 72 16 97 %  08/16/22 2327 -- -- -- 73 20 97 %  08/16/22 2326 -- -- -- 73 19 97 %  08/16/22 2325 -- -- -- 77 16 97 %  08/16/22 2324 -- -- -- 72 19 97 %  08/16/22 2323 -- -- -- 74 (!) 22 97 %  08/16/22 2322 -- -- -- 73 16 97 %  08/16/22 2321 -- -- -- 75 17 98 %  08/16/22 2320 -- -- -- 74 18 96 %  08/16/22 2319 -- -- -- 74 20 97 %  08/16/22 2318 -- -- -- 72 15 96 %  08/16/22 2317 -- -- -- 76 20 96 %  08/16/22 2316 -- -- -- 75 19 96 %  08/16/22 2315 -- -- -- 73 19 97 %  08/16/22 2314 -- -- -- 76 (!) 21 97 %  08/16/22 2313 -- -- -- 74 17 95 %  08/16/22 2312 -- -- -- 76 (!) 22 97 %  08/16/22 2311 -- -- -- 76 16 97 %  08/16/22 2310 -- -- -- 78 20 97 %  08/16/22 2309 -- -- -- 76 (!) 22 99 %  08/16/22 2308 -- -- -- 79 20 100 %  08/16/22 2307 -- -- -- 75 20 97 %  08/16/22 2306 -- -- -- 75 20 97 %  08/16/22 2305 -- -- -- 74 19 98 %  08/16/22 2304 -- -- -- 77 17 97 %  08/16/22 2303 -- -- -- 75 20 98 %  08/16/22 2302 -- -- -- 77 20 97 %  08/16/22 2301 -- -- -- 77 19 97 %   08/16/22 2300 -- -- -- 76 (!) 21 97 %  08/16/22 2259 -- -- -- 76 19 97 %  08/16/22 2258 -- -- -- 75 (!) 21 97 %  08/16/22 2257 -- -- -- 76 (!) 23 97 %  08/16/22 2256 -- -- -- 76 18 97 %  08/16/22 2255 -- -- -- 77 20 97 %  08/16/22 2254 -- -- -- 76 (!) 22 97 %  08/16/22 2253 -- -- -- 77 (!) 21 97 %  08/16/22 2252 -- -- -- 77 20 96 %  08/16/22 2251 -- -- -- 76 (!) 23 97 %  08/16/22 2250 -- -- -- 76 (!) 21 98 %  08/16/22 2249 -- -- -- 77 (!) 21 97 %  08/16/22 2248 -- -- -- 75 (!) 22 98 %  08/16/22 2247 -- -- -- 77 (!) 24 97 %  08/16/22 2246 -- -- -- 77 (!) 22 97 %  08/16/22 2245 -- -- -- 77 16 97 %  08/16/22 2244 -- -- -- 77 20 97 %  08/16/22 2243 -- -- -- 77 (!) 24 98 %  08/16/22 2242 -- -- -- 77 (!) 22 97 %  08/16/22 2241 -- -- -- 76 (!) 22 97 %  08/16/22 2240 -- -- -- 78 19 97 %  08/16/22 2239 -- -- -- 73 (!) 22 97 %  08/16/22 2238 -- -- -- 76 17 97 %  08/16/22 2237 -- -- -- 76 (!)   21 97 %  08/16/22 2236 -- -- -- 75 (!) 22 96 %  08/16/22 2235 -- -- -- 76 19 96 %  08/16/22 2234 -- -- -- 75 (!) 22 97 %  08/16/22 2233 -- -- -- 77 (!) 22 97 %  08/16/22 2232 -- -- -- 77 (!) 25 98 %  08/16/22 2231 -- -- -- 77 (!) 21 97 %  08/16/22 2230 -- -- -- 77 (!) 23 98 %  08/16/22 2229 -- -- -- 74 19 97 %  08/16/22 2228 -- -- -- 77 19 97 %  08/16/22 2227 -- -- -- 75 (!) 21 97 %  08/16/22 2226 -- -- -- 78 (!) 27 97 %  08/16/22 2225 -- -- -- 77 (!) 21 96 %  08/16/22 2224 -- -- -- 77 (!) 21 95 %  08/16/22 2223 -- -- -- 81 (!) 21 96 %  08/16/22 2222 -- -- -- 77 (!) 22 94 %  08/16/22 2221 -- -- -- 78 20 98 %  08/16/22 2220 -- -- -- 81 20 96 %  08/16/22 2219 -- -- -- 78 18 96 %  08/16/22 2218 -- -- -- 80 17 97 %  08/16/22 2217 -- -- -- 80 18 99 %  08/16/22 2216 -- -- -- 74 (!) 21 98 %  08/16/22 2215 -- -- -- 76 18 97 %  08/16/22 2214 -- -- -- 77 19 97 %  08/16/22 2213 (!) 160/61 98.6 F (37 C) Oral 76 20 97 %  08/16/22 2211 -- -- -- 77 17 97 %  08/16/22 2210 -- -- -- 73 19 97 %  08/16/22  2209 -- -- -- 76 18 96 %  08/16/22 2208 -- -- -- 77 18 96 %  08/16/22 2207 -- -- -- 76 18 97 %  08/16/22 2206 -- -- -- 76 19 96 %  08/16/22 2205 -- -- -- 77 19 96 %  08/16/22 2204 -- -- -- 77 18 98 %  08/16/22 2203 -- -- -- 75 (!) 21 96 %  08/16/22 2202 -- -- -- 76 20 96 %  08/16/22 2201 -- -- -- 77 16 96 %  08/16/22 2200 -- -- -- 77 (!) 23 96 %  08/16/22 2018 -- 98 F (36.7 C) Oral 79 -- 95 %  08/16/22 1636 (!) 159/68 99.6 F (37.6 C) Oral 77 16 96 %  08/16/22 1203 (!) 135/59 98.6 F (37 C) Oral 71 18 96 %  08/16/22 1136 (!) 129/41 99.3 F (37.4 C) Oral 67 15 96 %    Radiology: VAS [place] LOWER EXTREMITY VENOUS (DVT)  Result Date: 08/17/2022  Lower Venous DVT Study Patient Name:  MARTAVIS LUNDER  Date of Exam:   08/16/2022 Medical Rec #: [id number]            Accession #:    [id number] Date of Birth: 19-Dec-1950             Patient Gender: M Patient Age:   26 years Exam Location:  [facility] Procedure:      VAS [place] LOWER EXTREMITY VENOUS (DVT) Referring Phys: Lyda Perone --------------------------------------------------------------------------------  Indications: Swelling, Left TKA 2 days ago, and SOB.  Limitations: Swelling and vessel depth. Comparison Study: No previous study. Performing Technologist: McKayla Maag RVT, [place]  Examination Guidelines: A complete evaluation includes B-mode imaging, spectral Doppler, color Doppler, and power Doppler as needed of all accessible portions of each vessel. Bilateral testing is considered an integral part of a  complete examination. Limited examinations for reoccurring indications may be performed as noted. The reflux portion of the exam is performed with the patient in reverse Trendelenburg.  +-----+---------------+---------+-----------+----------+--------------+ RIGHTCompressibilityPhasicitySpontaneityPropertiesThrombus Aging +-----+---------------+---------+-----------+----------+--------------+ CFV  Full           Yes      Yes                                  +-----+---------------+---------+-----------+----------+--------------+ SFJ  Full                                                        +-----+---------------+---------+-----------+----------+--------------+   +---------+---------------+---------+-----------+----------+-------------------+ LEFT     CompressibilityPhasicitySpontaneityPropertiesThrombus Aging      +---------+---------------+---------+-----------+----------+-------------------+ CFV      Full           Yes      Yes                                      +---------+---------------+---------+-----------+----------+-------------------+ SFJ      Full                                                             +---------+---------------+---------+-----------+----------+-------------------+ FV Prox  Full                                                             +---------+---------------+---------+-----------+----------+-------------------+ FV Mid   Full           Yes      Yes                                      +---------+---------------+---------+-----------+----------+-------------------+ FV Distal               Yes      Yes                  Not well visualized +---------+---------------+---------+-----------+----------+-------------------+ PFV      Full                                                             +---------+---------------+---------+-----------+----------+-------------------+ POP      Full                                                             +---------+---------------+---------+-----------+----------+-------------------+ PTV  Full                                                             +---------+---------------+---------+-----------+----------+-------------------+ PERO     Full                                         Not well visualized +---------+---------------+---------+-----------+----------+-------------------+      Summary: RIGHT: - No evidence of common femoral vein obstruction.  LEFT: - There is no evidence of deep vein thrombosis in the lower extremity. However, portions of this examination were limited- see technologist comments above.  - No cystic structure found in the popliteal fossa.  *See table(s) above for measurements and observations. Electronically signed by Gerarda Fraction on 08/17/2022 at 8:52:10 AM.    Final    ECHOCARDIOGRAM COMPLETE  Result Date: 08/16/2022    ECHOCARDIOGRAM REPORT   Patient Name:   PERFECTO PURDY Date of Exam: 08/16/2022 Medical Rec #:  [id number]           Height:       69.5 in Accession #:    [id number]          Weight:       206.0 lb Date of Birth:  Mar 01, 1951            BSA:          2.102 m Patient Age:    71 years            BP:           135/59 mmHg Patient Gender: M                   HR:           71 bpm. Exam Location:  Inpatient Procedure: 2D Echo, Cardiac Doppler and Color Doppler Indications:    Dyspnea R06.00  History:        Patient has prior history of Echocardiogram examinations, most                 recent 01/10/2020. Cardiomegaly, CAD and Previous Myocardial                 Infarction; Risk Factors:Hypertension, Diabetes, Dyslipidemia                 and Former Smoker.  Sonographer:    Celesta Gentile RCS Referring Phys: [phone] DANIEL V THOMPSON  Sonographer Comments: Suboptimal subcostal window. Patient abdomen is very distended and somewhat tight. Difficult to image. IMPRESSIONS  1. Left ventricular ejection fraction, by estimation, is 70 to 75%. The left ventricle has hyperdynamic function. The left ventricle has no regional wall motion abnormalities. Left ventricular diastolic parameters are consistent with Grade I diastolic dysfunction (impaired relaxation).  2. Right ventricular systolic function is hyperdynamic. The right ventricular size is normal. Tricuspid regurgitation signal is inadequate for assessing PA pressure.  3. Left atrial size was severely dilated.  4.  Right atrial size was moderately dilated.  5. The mitral valve is grossly normal. No evidence of mitral valve regurgitation. No evidence of mitral stenosis.  6. The aortic valve is tricuspid. Aortic valve regurgitation is not visualized. No aortic stenosis is present.  7. Aortic dilatation noted. There is borderline dilatation of the aortic root, measuring 39 mm. Comparison(s): No significant change from prior study. FINDINGS  Left Ventricle: Left ventricular ejection fraction, by estimation, is 70 to 75%. The left ventricle has hyperdynamic function. The left ventricle has no regional wall motion abnormalities. Definity contrast agent was given IV to delineate the left ventricular endocardial borders. The left ventricular internal cavity size was normal in size. There is no left ventricular hypertrophy. Left ventricular diastolic parameters are consistent with Grade I diastolic dysfunction (impaired relaxation). Right Ventricle: The right ventricular size is normal. No increase in right ventricular wall thickness. Right ventricular systolic function is hyperdynamic. Tricuspid regurgitation signal is inadequate for assessing PA pressure. Left Atrium: Left atrial size was severely dilated. Right Atrium: Right atrial size was moderately dilated. Pericardium: There is no evidence of pericardial effusion. Mitral Valve: The mitral valve is grossly normal. No evidence of mitral valve regurgitation. No evidence of mitral valve stenosis. Tricuspid Valve: The tricuspid valve is not well visualized. Tricuspid valve regurgitation is not demonstrated. No evidence of tricuspid stenosis. Aortic Valve: The aortic valve is tricuspid. Aortic valve regurgitation is not visualized. No aortic stenosis is present. Pulmonic Valve: The pulmonic valve was not well visualized. Pulmonic valve regurgitation is not visualized. No evidence of pulmonic stenosis. Aorta: Aortic dilatation noted. There is borderline dilatation of the aortic root,  measuring 39 mm. Venous: The inferior vena cava was not well visualized. IAS/Shunts: The interatrial septum was not well visualized.  LEFT VENTRICLE PLAX 2D LVIDd:         5.10 cm   Diastology LVIDs:         3.50 cm   LV e' medial:    8.70 cm/s LV PW:         1.10 cm   LV E/e' medial:  11.5 LV IVS:        1.00 cm   LV e' lateral:   14.00 cm/s LVOT diam:     2.10 cm   LV E/e' lateral: 7.1 LV SV:         106 LV SV Index:   50 LVOT Area:     3.46 cm  RIGHT VENTRICLE RV S prime:     23.50 cm/s TAPSE (M-mode): 3.2 cm LEFT ATRIUM              Index        RIGHT ATRIUM           Index LA diam:        4.40 cm  2.09 cm/m   RA Area:     24.60 cm LA Vol (A2C):   95.2 ml  45.28 ml/m  RA Volume:   81.00 ml  38.53 ml/m LA Vol (A4C):   109.0 ml 51.84 ml/m LA Biplane Vol: 102.0 ml 48.51 ml/m  AORTIC VALVE LVOT Vmax:   148.00 cm/s LVOT Vmean:  95.700 cm/s LVOT VTI:    0.305 m  AORTA Ao Root diam: 3.90 cm MITRAL VALVE MV Area (PHT): 3.05 cm     SHUNTS MV Decel Time: 249 msec     Systemic VTI:  0.30 m MV E velocity: 100.00 cm/s  Systemic Diam: 2.10 cm MV A velocity: 119.00 cm/s MV E/A ratio:  0.84 Vishnu Priya Mallipeddi Electronically signed by Winfield Rast Mallipeddi Signature Date/Time: 08/16/2022/4:56:45 PM    Final    CT Angio Chest PE W and/or Wo Contrast  Result Date: 08/16/2022 CLINICAL DATA:  Suspected pulmonary embolism.  EXAM: CT ANGIOGRAPHY CHEST WITH CONTRAST TECHNIQUE: Multidetector CT imaging of the chest was performed using the standard protocol during bolus administration of intravenous contrast. Multiplanar CT image reconstructions and MIPs were obtained to evaluate the vascular anatomy. RADIATION DOSE REDUCTION: This exam was performed according to the departmental dose-optimization program which includes automated exposure control, adjustment of the mA and/or kV according to patient size and/or use of iterative reconstruction technique. CONTRAST:  75mL OMNIPAQUE IOHEXOL 350 MG/ML SOLN COMPARISON:  None  Available. FINDINGS: Cardiovascular: There is marked severity calcification of the aortic arch and descending thoracic aorta, without evidence of aortic aneurysm. Satisfactory opacification of the pulmonary arteries to the segmental level. No evidence of pulmonary embolism. Normal heart size with moderate severity coronary artery calcification. No pericardial effusion. Mediastinum/Nodes: No enlarged mediastinal, hilar, or axillary lymph nodes. Thyroid gland, trachea, and esophagus demonstrate no significant findings. Lungs/Pleura: Mild atelectasis is seen within the bilateral lung bases. There is no evidence of an acute infiltrate, pleural effusion or pneumothorax. Upper Abdomen: No acute abnormality. Musculoskeletal: No chest wall abnormality. No acute or significant osseous findings. Review of the MIP images confirms the above findings. IMPRESSION: 1. No evidence of pulmonary embolism or other acute intrathoracic process. 2. Moderate severity coronary artery calcification. 3. Aortic atherosclerosis. Aortic Atherosclerosis (ICD10-I70.0). Electronically Signed   By: Aram Candela M.D.   On: 08/16/2022 01:01   DG Chest Port 1 View  Result Date: 08/15/2022 CLINICAL DATA:  Possible sepsis. EXAM: PORTABLE CHEST 1 VIEW COMPARISON:  08/03/2020 FINDINGS: Low volume film. The cardio pericardial silhouette is enlarged. The lungs are clear without focal pneumonia, edema, pneumothorax or pleural effusion. No acute bony abnormality. IMPRESSION: No active disease. Electronically Signed   By: Kennith Center M.D.   On: 08/15/2022 20:04   DG Knee 1-2 Views Left  Result Date: 08/14/2022 CLINICAL DATA:  Status post left total knee replacement. EXAM: LEFT KNEE - 1-2 VIEW COMPARISON:  None Available. FINDINGS: The left femoral and tibial components are well situated. Expected postoperative changes are noted in the soft tissues anteriorly. IMPRESSION: Status post left total knee arthroplasty. Electronically Signed   By: Lupita Raider M.D.   On: 08/14/2022 08:30    Labs: Recent Labs    08/16/22 1214 08/17/22 0123  WBC 11.0* 9.9  RBC 3.12* 3.23*  HCT 28.7* 29.6*  PLT 180 212   Recent Labs    08/16/22 1214 08/17/22 0123  NA 128* 128*  K 3.7 3.3*  CL 93* 96*  CO2 24 24  BUN 11 8  CREATININE 0.84 0.91  GLUCOSE 131* 121*  CALCIUM 8.2* 7.9*   Recent Labs    08/15/22 2003 08/17/22 0123  INR 1.2 1.1    Review of Systems: ROS as detailed in HPI  Physical Exam: Body mass index is 29.98 kg/m.  Physical Exam   Gen: AAOx3, NAD Comfortable at rest  Left Lower Extremity: Skin intact, dressings c/d/I Compartments soft/compressible Pt able to actively ROM left knee No pain with small arcs of motion ADF/APF/EHL 5/5 SILT throughout DP, PT 2+ to palp CR < 2s   Assessment and Plan: Ortho consult for postop check s/p left TKA (Dr. Shelle Iron - 08/13/22)  -history, exam and imaging reviewed at length with patient/family -appropriate postop appearance of left knee now POD4 -well padded knee immobilizer when OOB as per postop instructions -PT/OT to help mobilize -he has follow up scheduled with Dr. Shelle Iron for 08/27/22, advised to reschedule for this week with Dr. Orpha Bur PA (  [phone])  Netta Cedars, MD Orthopaedic Surgeon [facility] [phone]

## 2022-08-17 NOTE — Progress Notes (Signed)
PROGRESS NOTE    Dakota Gibbs  ZOX:[id number] DOB: 10/10/1950 DOA: 08/15/2022 PCP: Mliss Sax, MD    Chief Complaint  Patient presents with   Post-op Problem    Brief Narrative:  Patient 72 year old gentleman history of type 2 diabetes, hypertension underwent left TKA 2 days prior to admission presented to the ED with generalized weakness, cough, shortness of breath.  Patient noted to be hypoxic in triage requiring O2.  Patient noted to have had a fever of 101 at home prior to admission and noted to have a temp of 100.4 on presentation to the ED.  Patient noted to have some pain in the left knee with redness and swelling.  Patient admitted for further evaluation and workup.  Lower extremity Dopplers done negative for DVT.  CT angiogram chest negative for PE or infiltrate.  Orthopedics consulted.  Cardiology consulted.    Assessment & Plan:  Principal Problem:   Fever postop Active Problems:   DM2 (diabetes mellitus, type 2)   Essential hypertension   GERD   S/P TKR (total knee replacement) using cement, left   Hyponatremia   SOB (shortness of breath)   Sepsis   Hypokalemia    Assessment and Plan: * Fever postop Fever of 100.4 + WBC 11k, technically meets SIRS criteria. Unknown source of infection.  Concern for left knee infection. Patient was placed on the sepsis pathway.   CT angiogram chest negative for PE or infiltrate. Lower extremity Dopplers negative for DVT. Urinalysis nitrite negative leukocytes negative. Blood cultures obtained pending.   Continue empiric IV vancomycin, IV Zosyn. Procalcitonin noted at 0.19. Patient seen in consultation by orthopedics who assessed patient does not feel patient with an acute infection at this time and recommended outpatient follow-up with orthopedics, Dr. Shelle Iron this week. Pulmonary toileting/incentive spirometry. If blood cultures remain negative through tomorrow patient remains afebrile may consider  discontinuation of antibiotics and monitoring off antibiotics.  Essential hypertension -BP currently stable.   -Placed on IV Lasix with urine output of 3.250 L over the past 24 hours. -Continue home regimen Lopressor.  -Continue to hold Norvasc, HCTZ, Benicar.   DM2 (diabetes mellitus, type 2) -Hemoglobin A1c 6.5 (08/06/2022 ). -CBG 143 this morning.   -Patient was on clears, will advance diet to heart healthy.   -Continue to hold oral hypoglycemic agents during the hospitalization and resume on discharge.    Hypokalemia -Secondary to diuretics. -Replete.  Sepsis -Sepsis ruled out  SOB (shortness of breath) -Questionable etiology. -Likely secondary to fluid resuscitation during recent hospitalization perioperatively. -Patient denies any chest pain. -CT angiogram chest done negative for PE or infiltrate. -Lower extremity Dopplers done negative for DVT. -2D echo with EF of 70 to 75%, grade 1 diastolic dysfunction, hyperdynamic right ventricular systolic function, severely dilated left atrial size, moderately dilated right atrial size. -Patient does state prior to surgery was having some shortness of breath with exertion. -Patient placed on Lasix 20 mg IV every 12 hours on 08/16/2022 with urine output of 3.250 L over the past 24 hours. -Cardiology consulted for further evaluation and management.  Hyponatremia -Likely secondary to hypervolemic hyponatremia in the setting of HCTZ. -Continue to hold HCTZ and likely will not resume on discharge. -Continue IV diuretics. -Repeat labs in the AM.  S/P TKR (total knee replacement) using cement, left -Patient assessed by orthopedics this morning. -PT/OT. -Outpatient follow-up with orthopedics.  GERD -PPI.         DVT prophylaxis: Lovenox Code Status: Full Family Communication: Updated patient.  No family at bedside. Disposition: Home with home health  Status is: Inpatient The patient will require care spanning > 2 midnights  and should be moved to inpatient because: Severity of illness   Consultants:  Orthopedics: Dr. Odis Hollingshead 08/17/2022 Cardiology: Dr. Elberta Fortis 08/17/2022  Procedures:  CT angiogram chest 08/16/2022 Chest x-ray 08/15/2022 2D echo 08/16/2022 Lower extremity Dopplers 08/16/2018:   Antimicrobials:  IV Zosyn 08/16/2022>>> IV vancomycin 08/16/2022>>>>   Subjective: Sitting up in recliner.  Overall feeling better.  States some improvement with shortness of breath on minimal ambulation however did require help going back and forth to the bathroom.  Denies any bleeding.  Patient noted to have significant urine output over the past 24 hours.  Afebrile over the past 24 hours.  Objective: Vitals:   08/17/22 0442 08/17/22 0443 08/17/22 0717 08/17/22 1423  BP:    (!) 146/68  Pulse: 60 (!) 58  98  Resp: 19 16    Temp:   98 F (36.7 C) 98 F (36.7 C)  TempSrc:      SpO2: 96% 96%  97%  Weight:      Height:        Intake/Output Summary (Last 24 hours) at 08/17/2022 1915 Last data filed at 08/17/2022 1834 Gross per 24 hour  Intake 1104 ml  Output 2375 ml  Net -1271 ml   Filed Weights   08/15/22 1906  Weight: 93.4 kg    Examination:  General exam: NAD. Respiratory system: CTAB.  No wheezes, no crackles, no rhonchi.  Fair air movement.  Speaking in full sentences. Cardiovascular system: Regular rate and rhythm no murmurs rubs or gallops.  No JVD.  No lower extremity edema.   Gastrointestinal system: Abdomen is soft, nontender, nondistended, positive bowel sounds.  No rebound.  No guarding.   Central nervous system: Alert and oriented. No focal neurological deficits. Extremities: Left lower extremity with 1-2+ edema, left knee with improved erythema, decreased warmth, no significant tenderness to palpation.  Knee immobilizer in place.  Skin: No rashes, lesions or ulcers Psychiatry: Judgement and insight appear normal. Mood & affect appropriate.     Data Reviewed:   CBC: Recent Labs   Lab 08/14/22 0805 08/15/22 2003 08/16/22 1214 08/17/22 0123  WBC 10.0 11.9* 11.0* 9.9  NEUTROABS 7.5 9.3* 8.7* 7.5  HGB 12.3* 10.9* 10.1* 10.5*  HCT 37.5* 31.4* 28.7* 29.6*  MCV 95.4 91.8 92.0 91.6  PLT 220 203 180 212    Basic Metabolic Panel: Recent Labs  Lab 08/14/22 0805 08/15/22 2003 08/16/22 1214 08/17/22 0123  NA 131* 126* 128* 128*  K 4.4 4.2 3.7 3.3*  CL 99 91* 93* 96*  CO2 25 24 24 24   GLUCOSE 156* 132* 131* 121*  BUN 16 14 11 8   CREATININE 0.82 0.91 0.84 0.91  CALCIUM 8.0* 8.6* 8.2* 7.9*  MG  --   --  1.9  --     GFR: Estimated Creatinine Clearance: 82.3 mL/min (by C-G formula based on SCr of 0.91 mg/dL).  Liver Function Tests: Recent Labs  Lab 08/15/22 2003 08/17/22 0123  AST 25 24  ALT 16 18  ALKPHOS 54 51  BILITOT 1.2 1.2  PROT 6.2* 5.7*  ALBUMIN 2.9* 2.4*    CBG: Recent Labs  Lab 08/17/22 0012 08/17/22 0429 08/17/22 0715 08/17/22 1157 08/17/22 1619  GLUCAP 117* 131* 143* 121* 127*     Recent Results (from the past 240 hour(s))  Urine Culture     Status: None   Collection Time: 08/15/22  7:41 PM   Specimen: Urine, Clean Catch  Result Value Ref Range Status   Specimen Description URINE, CLEAN CATCH  Final   Special Requests NONE  Final   Culture   Final    NO GROWTH Performed at [facility], [address], [place], [place] [address]    Report Status 08/17/2022 FINAL  Final  Resp panel by RT-PCR (RSV, Flu A&B, Covid)     Status: None   Collection Time: 08/15/22  7:42 PM   Specimen: Nasal Swab  Result Value Ref Range Status   SARS Coronavirus 2 by RT PCR NEGATIVE NEGATIVE Final   Influenza A by PCR NEGATIVE NEGATIVE Final   Influenza B by PCR NEGATIVE NEGATIVE Final    Comment: (NOTE) The Xpert Xpress SARS-CoV-2/FLU/RSV plus assay is intended as an aid in the diagnosis of influenza from Nasopharyngeal swab specimens and should not be used as a sole basis for treatment. Nasal washings and aspirates are unacceptable  for Xpert Xpress SARS-CoV-2/FLU/RSV testing.  Fact Sheet for Patients: BloggerCourse.com  Fact Sheet for Healthcare Providers: SeriousBroker.it  This test is not yet approved or cleared by the [place] FDA and has been authorized for detection and/or diagnosis of SARS-CoV-2 by FDA under an Emergency Use Authorization (EUA). This EUA will remain in effect (meaning this test can be used) for the duration of the COVID-19 declaration under Section 564(b)(1) of the Act, 21 U.S.C. section 360bbb-3(b)(1), unless the authorization is terminated or revoked.     Resp Syncytial Virus by PCR NEGATIVE NEGATIVE Final    Comment: (NOTE) Fact Sheet for Patients: BloggerCourse.com  Fact Sheet for Healthcare Providers: SeriousBroker.it  This test is not yet approved or cleared by the [place] FDA and has been authorized for detection and/or diagnosis of SARS-CoV-2 by FDA under an Emergency Use Authorization (EUA). This EUA will remain in effect (meaning this test can be used) for the duration of the COVID-19 declaration under Section 564(b)(1) of the Act, 21 U.S.C. section 360bbb-3(b)(1), unless the authorization is terminated or revoked.  Performed at [facility], [address] [place], [place] [address]   Blood Culture (routine x 2)     Status: None (Preliminary result)   Collection Time: 08/15/22  8:03 PM   Specimen: BLOOD  Result Value Ref Range Status   Specimen Description BLOOD RIGHT ANTECUBITAL  Final   Special Requests   Final    BOTTLES DRAWN AEROBIC AND ANAEROBIC Blood Culture results may not be optimal due to an excessive volume of blood received in culture bottles   Culture   Final    NO GROWTH 2 DAYS Performed at [facility], [address], [place], [place] [address]    Report Status PENDING  Incomplete  Blood Culture (routine x 2)     Status: None  (Preliminary result)   Collection Time: 08/15/22  8:45 PM   Specimen: BLOOD  Result Value Ref Range Status   Specimen Description BLOOD LEFT ANTECUBITAL  Final   Special Requests   Final    BOTTLES DRAWN AEROBIC AND ANAEROBIC Blood Culture adequate volume   Culture   Final    NO GROWTH 2 DAYS Performed at [facility], [address] [place], [place] [address]    Report Status PENDING  Incomplete         Radiology Studies: VAS [place] LOWER EXTREMITY VENOUS (DVT)  Result Date: 08/17/2022  Lower Venous DVT Study Patient Name:  MARSHA HILLMAN  Blass  Date of Exam:   08/16/2022 Medical Rec #: [id number]            Accession #:    [id number] Date of Birth: [birth year]             Patient Gender: M Patient Age:   1 years Exam Location:  [facility] Procedure:      VAS [place] LOWER EXTREMITY VENOUS (DVT) Referring Phys: Lyda Perone --------------------------------------------------------------------------------  Indications: Swelling, Left TKA 2 days ago, and SOB.  Limitations: Swelling and vessel depth. Comparison Study: No previous study. Performing Technologist: McKayla Maag RVT, [place]  Examination Guidelines: A complete evaluation includes B-mode imaging, spectral Doppler, color Doppler, and power Doppler as needed of all accessible portions of each vessel. Bilateral testing is considered an integral part of a complete examination. Limited examinations for reoccurring indications may be performed as noted. The reflux portion of the exam is performed with the patient in reverse Trendelenburg.  +-----+---------------+---------+-----------+----------+--------------+ RIGHTCompressibilityPhasicitySpontaneityPropertiesThrombus Aging +-----+---------------+---------+-----------+----------+--------------+ CFV  Full           Yes      Yes                                 +-----+---------------+---------+-----------+----------+--------------+ SFJ  Full                                                         +-----+---------------+---------+-----------+----------+--------------+   +---------+---------------+---------+-----------+----------+-------------------+ LEFT     CompressibilityPhasicitySpontaneityPropertiesThrombus Aging      +---------+---------------+---------+-----------+----------+-------------------+ CFV      Full           Yes      Yes                                      +---------+---------------+---------+-----------+----------+-------------------+ SFJ      Full                                                             +---------+---------------+---------+-----------+----------+-------------------+ FV Prox  Full                                                             +---------+---------------+---------+-----------+----------+-------------------+ FV Mid   Full           Yes      Yes                                      +---------+---------------+---------+-----------+----------+-------------------+ FV Distal               Yes      Yes                  Not well visualized +---------+---------------+---------+-----------+----------+-------------------+ PFV  Full                                                             +---------+---------------+---------+-----------+----------+-------------------+ POP      Full                                                             +---------+---------------+---------+-----------+----------+-------------------+ PTV      Full                                                             +---------+---------------+---------+-----------+----------+-------------------+ PERO     Full                                         Not well visualized +---------+---------------+---------+-----------+----------+-------------------+     Summary: RIGHT: - No evidence of common femoral vein obstruction.  LEFT: - There is no evidence of deep vein thrombosis in the lower extremity. However,  portions of this examination were limited- see technologist comments above.  - No cystic structure found in the popliteal fossa.  *See table(s) above for measurements and observations. Electronically signed by Gerarda Fraction on 08/17/2022 at 8:52:10 AM.    Final    ECHOCARDIOGRAM COMPLETE  Result Date: 08/16/2022    ECHOCARDIOGRAM REPORT   Patient Name:   WONG STEADHAM Date of Exam: 08/16/2022 Medical Rec #:  [id number]           Height:       69.5 in Accession #:    [id number]          Weight:       206.0 lb Date of Birth:  June 10, 1950            BSA:          2.102 m Patient Age:    71 years            BP:           135/59 mmHg Patient Gender: M                   HR:           71 bpm. Exam Location:  Inpatient Procedure: 2D Echo, Cardiac Doppler and Color Doppler Indications:    Dyspnea R06.00  History:        Patient has prior history of Echocardiogram examinations, most                 recent 01/10/2020. Cardiomegaly, CAD and Previous Myocardial                 Infarction; Risk Factors:Hypertension, Diabetes, Dyslipidemia                 and Former Smoker.  Sonographer:    Celesta Gentile RCS Referring Phys: [id number] Lovey Newcomer  Roxanne Orner  Sonographer Comments: Suboptimal subcostal window. Patient abdomen is very distended and somewhat tight. Difficult to image. IMPRESSIONS  1. Left ventricular ejection fraction, by estimation, is 70 to 75%. The left ventricle has hyperdynamic function. The left ventricle has no regional wall motion abnormalities. Left ventricular diastolic parameters are consistent with Grade I diastolic dysfunction (impaired relaxation).  2. Right ventricular systolic function is hyperdynamic. The right ventricular size is normal. Tricuspid regurgitation signal is inadequate for assessing PA pressure.  3. Left atrial size was severely dilated.  4. Right atrial size was moderately dilated.  5. The mitral valve is grossly normal. No evidence of mitral valve regurgitation. No evidence of mitral stenosis.   6. The aortic valve is tricuspid. Aortic valve regurgitation is not visualized. No aortic stenosis is present.  7. Aortic dilatation noted. There is borderline dilatation of the aortic root, measuring 39 mm. Comparison(s): No significant change from prior study. FINDINGS  Left Ventricle: Left ventricular ejection fraction, by estimation, is 70 to 75%. The left ventricle has hyperdynamic function. The left ventricle has no regional wall motion abnormalities. Definity contrast agent was given IV to delineate the left ventricular endocardial borders. The left ventricular internal cavity size was normal in size. There is no left ventricular hypertrophy. Left ventricular diastolic parameters are consistent with Grade I diastolic dysfunction (impaired relaxation). Right Ventricle: The right ventricular size is normal. No increase in right ventricular wall thickness. Right ventricular systolic function is hyperdynamic. Tricuspid regurgitation signal is inadequate for assessing PA pressure. Left Atrium: Left atrial size was severely dilated. Right Atrium: Right atrial size was moderately dilated. Pericardium: There is no evidence of pericardial effusion. Mitral Valve: The mitral valve is grossly normal. No evidence of mitral valve regurgitation. No evidence of mitral valve stenosis. Tricuspid Valve: The tricuspid valve is not well visualized. Tricuspid valve regurgitation is not demonstrated. No evidence of tricuspid stenosis. Aortic Valve: The aortic valve is tricuspid. Aortic valve regurgitation is not visualized. No aortic stenosis is present. Pulmonic Valve: The pulmonic valve was not well visualized. Pulmonic valve regurgitation is not visualized. No evidence of pulmonic stenosis. Aorta: Aortic dilatation noted. There is borderline dilatation of the aortic root, measuring 39 mm. Venous: The inferior vena cava was not well visualized. IAS/Shunts: The interatrial septum was not well visualized.  LEFT VENTRICLE PLAX 2D  LVIDd:         5.10 cm   Diastology LVIDs:         3.50 cm   LV e' medial:    8.70 cm/s LV PW:         1.10 cm   LV E/e' medial:  11.5 LV IVS:        1.00 cm   LV e' lateral:   14.00 cm/s LVOT diam:     2.10 cm   LV E/e' lateral: 7.1 LV SV:         106 LV SV Index:   50 LVOT Area:     3.46 cm  RIGHT VENTRICLE RV S prime:     23.50 cm/s TAPSE (M-mode): 3.2 cm LEFT ATRIUM              Index        RIGHT ATRIUM           Index LA diam:        4.40 cm  2.09 cm/m   RA Area:     24.60 cm LA Vol (A2C):   95.2 ml  45.28 ml/m  RA Volume:   81.00 ml  38.53 ml/m LA Vol (A4C):   109.0 ml 51.84 ml/m LA Biplane Vol: 102.0 ml 48.51 ml/m  AORTIC VALVE LVOT Vmax:   148.00 cm/s LVOT Vmean:  95.700 cm/s LVOT VTI:    0.305 m  AORTA Ao Root diam: 3.90 cm MITRAL VALVE MV Area (PHT): 3.05 cm     SHUNTS MV Decel Time: 249 msec     Systemic VTI:  0.30 m MV E velocity: 100.00 cm/s  Systemic Diam: 2.10 cm MV A velocity: 119.00 cm/s MV E/A ratio:  0.84 Vishnu Priya Mallipeddi Electronically signed by Winfield Rast Mallipeddi Signature Date/Time: 08/16/2022/4:56:45 PM    Final    CT Angio Chest PE W and/or Wo Contrast  Result Date: 08/16/2022 CLINICAL DATA:  Suspected pulmonary embolism. EXAM: CT ANGIOGRAPHY CHEST WITH CONTRAST TECHNIQUE: Multidetector CT imaging of the chest was performed using the standard protocol during bolus administration of intravenous contrast. Multiplanar CT image reconstructions and MIPs were obtained to evaluate the vascular anatomy. RADIATION DOSE REDUCTION: This exam was performed according to the departmental dose-optimization program which includes automated exposure control, adjustment of the mA and/or kV according to patient size and/or use of iterative reconstruction technique. CONTRAST:  75mL OMNIPAQUE IOHEXOL 350 MG/ML SOLN COMPARISON:  None Available. FINDINGS: Cardiovascular: There is marked severity calcification of the aortic arch and descending thoracic aorta, without evidence of aortic  aneurysm. Satisfactory opacification of the pulmonary arteries to the segmental level. No evidence of pulmonary embolism. Normal heart size with moderate severity coronary artery calcification. No pericardial effusion. Mediastinum/Nodes: No enlarged mediastinal, hilar, or axillary lymph nodes. Thyroid gland, trachea, and esophagus demonstrate no significant findings. Lungs/Pleura: Mild atelectasis is seen within the bilateral lung bases. There is no evidence of an acute infiltrate, pleural effusion or pneumothorax. Upper Abdomen: No acute abnormality. Musculoskeletal: No chest wall abnormality. No acute or significant osseous findings. Review of the MIP images confirms the above findings. IMPRESSION: 1. No evidence of pulmonary embolism or other acute intrathoracic process. 2. Moderate severity coronary artery calcification. 3. Aortic atherosclerosis. Aortic Atherosclerosis (ICD10-I70.0). Electronically Signed   By: Aram Candela M.D.   On: 08/16/2022 01:01   DG Chest Port 1 View  Result Date: 08/15/2022 CLINICAL DATA:  Possible sepsis. EXAM: PORTABLE CHEST 1 VIEW COMPARISON:  08/03/2020 FINDINGS: Low volume film. The cardio pericardial silhouette is enlarged. The lungs are clear without focal pneumonia, edema, pneumothorax or pleural effusion. No acute bony abnormality. IMPRESSION: No active disease. Electronically Signed   By: Kennith Center M.D.   On: 08/15/2022 20:04        Scheduled Meds:  cholecalciferol  2,000 Units Oral Daily   citalopram  30 mg Oral Daily   cyanocobalamin  1,000 mcg Oral Daily   enoxaparin (LOVENOX) injection  40 mg Subcutaneous Q24H   ezetimibe  10 mg Oral QHS   fluticasone  1 spray Each Nare Daily   furosemide  20 mg Intravenous Q12H   insulin aspart  0-9 Units Subcutaneous Q4H   loratadine  10 mg Oral Daily   metoprolol tartrate  25 mg Oral QHS   omeprazole  20 mg Oral QHS   oxymetazoline  1 spray Each Nare QHS   polyethylene glycol  17 g Oral Daily   potassium  chloride  40 mEq Oral Daily   Continuous Infusions:  piperacillin-tazobactam (ZOSYN)  IV 3.375 g (08/17/22 1829)   vancomycin 1,000 mg (08/17/22 1834)     LOS: 0 days    Time  spent: 35 minutes    Ramiro Harvest, MD [facility]   To contact the attending provider between 7A-7P or the covering provider during after hours 7P-7A, please log into the web site [email or web address] and access using universal [place] password for that web site. If you do not have the password, please call the hospital operator.  08/17/2022, 7:15 PM

## 2022-08-17 NOTE — Consult Note (Signed)
Cardiology Consultation   Patient ID: Dakota Gibbs MRN: [id number]; DOB: 08/05/1950  Admit date: 08/15/2022 Date of Consult: 08/17/2022  PCP:  Mliss Sax, MD   [facility] Providers Cardiologist:  Nicki Guadalajara, MD  Cardiology APP:  Marcelino Duster, [place]       Patient Profile:   Dakota Gibbs is a 72 y.o. male with a hx of coronary artery disease post inferior MI with PCI to the RCA, [name] stenosis, hypertension, hyperlipidemia, GERD who is being seen 08/17/2022 for the evaluation of shortness of breath at the request of Ramiro Harvest.  History of Present Illness:   Dakota Gibbs is postop day 4 from left knee replacement.  He was mildly short of breath prior to the knee replacement.  Since then, he has had more shortness of breath.  He feels that he cannot walk 20 feet with his walker since surgery without significant shortness of breath and fatigue.  He has generalized weakness.  At home, he was noted to be febrile with a temperature of 101, 100.4 in triage in the emergency room.  He states that he has been sleeping with his head elevated.  He does have a CPAP but has not been wearing it since surgery.  Prior to surgery, when he was doing yard work, he had to sit down frequently due to shortness of breath.   Past Medical History:  Diagnosis Date   Allergy    enviornmental   Anxiety    Anxiety and depression 10/25/2012   Arthritis    self dx   Back pain    L4-L5 bulging disc, L5-S1 - bulging disc, pinched nerve in neck   Cataract    bilateral sx   Coronary artery disease    hx of balloon angioplasty with DR Bishop Limbo   Depression    Diabetes mellitus    pt denies   History of shingles 2009   Hyperlipidemia    on meds   Hypertension    on meds   Myocardial infarction 1997   caused by a blood clot   Sleep apnea    cpap    Past Surgical History:  Procedure Laterality Date   ANGIOPLASTY  1997   BACK SURGERY  04/02/2022   basel  cell     skin carcinoma removed x 4   BILATERAL CARPAL TUNNEL RELEASE     CARDIAC CATHETERIZATION     10/04/1999   COLONOSCOPY  2018   SA-MAC-suprep(good)-TA   DOPPLER ECHOCARDIOGRAPHY  07/09/2006   EF 50 to 55 %, LA mildy dilated   HERNIA REPAIR  1951   RIGHT   KNEE ARTHROSCOPY Right 2001   R   NM MYOCAR PERF WALL MOTION  08/22/2011   Mets 13,low risk study   POLYPECTOMY  2018   TA   TOTAL KNEE ARTHROPLASTY Left 08/13/2022   Procedure: TOTAL KNEE ARTHROPLASTY;  Surgeon: Jene Every, MD;  Location: [facility];  Service: Orthopedics;  Laterality: Left;   WRIST SURGERY  1984   right     Home Medications:  Prior to Admission medications   Medication Sig Start Date End Date Taking? Authorizing Provider  amLODipine (NORVASC) 5 MG tablet TAKE ONE TABLET BY MOUTH DAILY Patient taking differently: Take 5 mg by mouth at bedtime. 01/01/22  Yes Mliss Sax, MD  aspirin EC 81 MG tablet Take 1 tablet (81 mg total) by mouth 2 (two) times daily after a meal. Day after surgery 08/13/22  Yes Beane,  Tinnie Gens, MD  cetirizine (ZYRTEC) 10 MG tablet Take 10 mg by mouth daily.   Yes [provider]  Cholecalciferol (VITAMIN D) 50 MCG (2000 UT) tablet Take 2,000 Units by mouth daily.   Yes [provider]  citalopram (CELEXA) 20 MG tablet ( GENERIC FOR CELEXA) TAKE ONE AND ONE-HALF TABLETS BY MOUTH DAILY 08/06/22  Yes Mliss Sax, MD  cyanocobalamin (VITAMIN B12) 1000 MCG tablet Take 1,000 mcg by mouth daily.   Yes [provider]  docusate sodium (COLACE) 100 MG capsule Take 1 capsule (100 mg total) by mouth 2 (two) times daily as needed for mild constipation. 08/13/22  Yes Jene Every, MD  ezetimibe (ZETIA) 10 MG tablet TAKE ONE TABLET BY MOUTH DAILY Patient taking differently: Take 10 mg by mouth at bedtime. 04/29/22  Yes Lennette Bihari, MD  fluticasone (FLONASE) 50 MCG/ACT nasal spray Place 1 spray into both nostrils daily.   Yes [provider]   HYDROcodone-acetaminophen (NORCO) 10-325 MG tablet Take 1 tablet by mouth every 4 (four) hours as needed for up to 7 days for severe pain. 08/13/22 08/20/22 Yes Jene Every, MD  metFORMIN (GLUCOPHAGE-XR) 500 MG 24 hr tablet TAKE ONE TABLET BY MOUTH EVERY DAY WITH BREAKFAST 05/02/22  Yes Mliss Sax, MD  methocarbamol (ROBAXIN) 500 MG tablet Take 1 tablet (500 mg total) by mouth every 8 (eight) hours as needed for muscle spasms. 08/13/22  Yes Jene Every, MD  metoprolol tartrate (LOPRESSOR) 25 MG tablet TAKE 1/2 TO 1 TABLET EVERY DAY  AS NEEDED (HEART PALPITATIONS). Patient taking differently: Take 25 mg by mouth at bedtime. 10/22/20  Yes Lennette Bihari, MD  Multiple Vitamin (MULTIVITAMIN) tablet Take 1 tablet by mouth daily.   Yes [provider]  olmesartan (BENICAR) 20 MG tablet Take 1 tablet (20 mg total) by mouth daily. Patient taking differently: Take 20 mg by mouth at bedtime. 06/05/22  Yes Mliss Sax, MD  omeprazole (PRILOSEC OTC) 20 MG tablet Take 20 mg by mouth at bedtime.   Yes [provider]  oxymetazoline (AFRIN) 0.05 % nasal spray Place 1 spray into both nostrils at bedtime.   Yes [provider]  pyridOXINE (VITAMIN B6) 100 MG tablet Take 100 mg by mouth daily.   Yes [provider]  tadalafil (CIALIS) 20 MG tablet May take one or two up to every other day as needed for ed. 08/27/20  Yes Mliss Sax, MD  trolamine salicylate (ASPERCREME) 10 % cream Apply 1 Application topically as needed for muscle pain.   Yes [provider]  vitamin C (ASCORBIC ACID) 500 MG tablet Take 500 mg by mouth daily.   Yes [provider]  hydrochlorothiazide (MICROZIDE) 12.5 MG capsule TAKE 2 CAPSULES EVERY OTHER DAY ALTERNATING WITH TAKE 1 CAPSULE EVERY OTHER DAY AS DIRECTED 06/30/22   Lennette Bihari, MD  nitroGLYCERIN (NITROSTAT) 0.4 MG SL tablet For chest pain, tightness, or pressure. While sitting, place 1 tablet  under tongue. May be used every 5 minutes as needed, for up to 15 minutes. Do not use more than 3 tablets. 06/16/22   Lennette Bihari, MD  polyethylene glycol (MIRALAX / GLYCOLAX) 17 g packet Take 17 g by mouth daily. 08/13/22   Jene Every, MD    Inpatient Medications: Scheduled Meds:  cholecalciferol  2,000 Units Oral Daily   citalopram  30 mg Oral Daily   cyanocobalamin  1,000 mcg Oral Daily   enoxaparin (LOVENOX) injection  40 mg Subcutaneous  Q24H   ezetimibe  10 mg Oral QHS   fluticasone  1 spray Each Nare Daily   furosemide  20 mg Intravenous Q12H   insulin aspart  0-9 Units Subcutaneous Q4H   loratadine  10 mg Oral Daily   metoprolol tartrate  25 mg Oral QHS   omeprazole  20 mg Oral QHS   oxymetazoline  1 spray Each Nare QHS   polyethylene glycol  17 g Oral Daily   potassium chloride  40 mEq Oral Daily   Continuous Infusions:  piperacillin-tazobactam (ZOSYN)  IV 3.375 g (08/17/22 0754)   vancomycin 1,000 mg (08/17/22 0549)   PRN Meds: acetaminophen **OR** acetaminophen, methocarbamol, ondansetron **OR** ondansetron (ZOFRAN) IV, traMADol  Allergies:   No Known Allergies  Social History:   Social History   Socioeconomic History   Marital status: Married    Spouse name: Not on file   Number of children: 2   Years of education: Not on file   Highest education level: Not on file  Occupational History   Occupation: executive recruter   Tobacco Use   Smoking status: Former    Types: Cigars    Quit date: 09/2020    Years since quitting: 1.9   Smokeless tobacco: Never  Vaping Use   Vaping Use: Never used  Substance and Sexual Activity   Alcohol use: Yes    Alcohol/week: 21.0 standard drinks of alcohol    Types: 21 Glasses of wine per week    Comment: 2 glasses wine daily   Drug use: No   Sexual activity: Yes  Other Topics Concern   Not on file  Social History Narrative   Lives w/ wife      Right Handed    Lives in a two story home   Drinks two cups of  coffee daily      Social Determinants of Health   Financial Resource Strain: Not on file  Food Insecurity: No Food Insecurity (08/16/2022)   Hunger Vital Sign    Worried About Running Out of Food in the Last Year: Never true    Ran Out of Food in the Last Year: Never true  Transportation Needs: No Transportation Needs (08/16/2022)   PRAPARE - Administrator, Civil Service (Medical): No    Lack of Transportation (Non-Medical): No  Physical Activity: Not on file  Stress: Not on file  Social Connections: Not on file  Intimate Partner Violence: Not At Risk (08/16/2022)   Humiliation, Afraid, Rape, and Kick questionnaire    Fear of Current or Ex-Partner: No    Emotionally Abused: No    Physically Abused: No    Sexually Abused: No    Family History:    Family History  Problem Relation Age of Onset   Stroke Mother        TIA   COPD Mother    Hypertension Sister    Prostate cancer Father 24   Coronary artery disease Father        GM   Diabetes Brother    Prostate cancer Brother 61   Diabetes Maternal Grandmother    Colon cancer Neg Hx    Esophageal cancer Neg Hx    Colon polyps Neg Hx    Rectal cancer Neg Hx    Stomach cancer Neg Hx      ROS:  Please see the history of present illness.   All other ROS reviewed and negative.     Physical Exam/Data:   Vitals:  08/17/22 0441 08/17/22 0442 08/17/22 0443 08/17/22 0717  BP:      Pulse: 60 60 (!) 58   Resp: 19 19 16    Temp:    98 F (36.7 C)  TempSrc:      SpO2: 96% 96% 96%   Weight:      Height:        Intake/Output Summary (Last 24 hours) at 08/17/2022 1018 Last data filed at 08/17/2022 0440 Gross per 24 hour  Intake 348.35 ml  Output 3250 ml  Net -2901.65 ml      08/15/2022    7:06 PM 08/13/2022    8:13 AM 08/06/2022    9:26 AM  Last 3 Weights  Weight (lbs) 206 lb 206 lb 206 lb  Weight (kg) 93.441 kg 93.441 kg 93.441 kg     Body mass index is 29.98 kg/m.  General:  Well nourished, well  developed, in no acute distress HEENT: normal Neck: no JVD Vascular: No carotid bruits; Distal pulses 2+ bilaterally Cardiac:  normal S1, S2; RRR; no murmur  Lungs:  clear to auscultation bilaterally, no wheezing, rhonchi or rales  Abd: soft, nontender, no hepatomegaly  Ext: no edema Musculoskeletal:  No deformities, BUE and BLE strength normal and equal Skin: warm and dry  Neuro:  CNs 2-12 intact, no focal abnormalities noted Psych:  Normal affect   EKG:  The EKG was personally reviewed and demonstrates:  sinus rhythm Telemetry:  Telemetry was personally reviewed and demonstrates:  sinus rhythm with PVCs  Relevant CV Studies: TTE  1. Left ventricular ejection fraction, by estimation, is 70 to 75%. The  left ventricle has hyperdynamic function. The left ventricle has no  regional wall motion abnormalities. Left ventricular diastolic parameters  are consistent with Grade I diastolic  dysfunction (impaired relaxation).   2. Right ventricular systolic function is hyperdynamic. The right  ventricular size is normal. Tricuspid regurgitation signal is inadequate  for assessing PA pressure.   3. Left atrial size was severely dilated.   4. Right atrial size was moderately dilated.   5. The mitral valve is grossly normal. No evidence of mitral valve  regurgitation. No evidence of mitral stenosis.   6. The aortic valve is tricuspid. Aortic valve regurgitation is not  visualized. No aortic stenosis is present.   7. Aortic dilatation noted. There is borderline dilatation of the aortic  root, measuring 39 mm.   Laboratory Data:  High Sensitivity Troponin:   Recent Labs  Lab 08/15/22 2003 08/15/22 2320  TROPONINIHS 13 14     Chemistry Recent Labs  Lab 08/15/22 2003 08/16/22 1214 08/17/22 0123  NA 126* 128* 128*  K 4.2 3.7 3.3*  CL 91* 93* 96*  CO2 24 24 24   GLUCOSE 132* 131* 121*  BUN 14 11 8   CREATININE 0.91 0.84 0.91  CALCIUM 8.6* 8.2* 7.9*  MG  --  1.9  --   GFRNONAA  >60 >60 >60  ANIONGAP 11 11 8     Recent Labs  Lab 08/15/22 2003 08/17/22 0123  PROT 6.2* 5.7*  ALBUMIN 2.9* 2.4*  AST 25 24  ALT 16 18  ALKPHOS 54 51  BILITOT 1.2 1.2   Lipids No results for input(s): "CHOL", "TRIG", "HDL", "LABVLDL", "LDLCALC", "CHOLHDL" in the last 168 hours.  Hematology Recent Labs  Lab 08/15/22 2003 08/16/22 1214 08/17/22 0123  WBC 11.9* 11.0* 9.9  RBC 3.42* 3.12* 3.23*  HGB 10.9* 10.1* 10.5*  HCT 31.4* 28.7* 29.6*  MCV 91.8 92.0 91.6  MCH  31.9 32.4 32.5  MCHC 34.7 35.2 35.5  RDW 13.2 13.2 13.3  PLT 203 180 212   Thyroid No results for input(s): "TSH", "FREET4" in the last 168 hours.  BNP Recent Labs  Lab 08/15/22 2003 08/17/22 0124  BNP 312.3* 265.8*    DDimer No results for input(s): "DDIMER" in the last 168 hours.   Radiology/Studies:  VAS [place] LOWER EXTREMITY VENOUS (DVT)  Result Date: 08/17/2022  Lower Venous DVT Study Patient Name:  NOAL ABSHIER  Date of Exam:   08/16/2022 Medical Rec #: [id number]            Accession #:    [id number] Date of Birth: 31-May-1950             Patient Gender: M Patient Age:   50 years Exam Location:  [facility] Procedure:      VAS [place] LOWER EXTREMITY VENOUS (DVT) Referring Phys: Lyda Perone --------------------------------------------------------------------------------  Indications: Swelling, Left TKA 2 days ago, and SOB.  Limitations: Swelling and vessel depth. Comparison Study: No previous study. Performing Technologist: McKayla Maag RVT, [place]  Examination Guidelines: A complete evaluation includes B-mode imaging, spectral Doppler, color Doppler, and power Doppler as needed of all accessible portions of each vessel. Bilateral testing is considered an integral part of a complete examination. Limited examinations for reoccurring indications may be performed as noted. The reflux portion of the exam is performed with the patient in reverse Trendelenburg.   +-----+---------------+---------+-----------+----------+--------------+ RIGHTCompressibilityPhasicitySpontaneityPropertiesThrombus Aging +-----+---------------+---------+-----------+----------+--------------+ CFV  Full           Yes      Yes                                 +-----+---------------+---------+-----------+----------+--------------+ SFJ  Full                                                        +-----+---------------+---------+-----------+----------+--------------+   +---------+---------------+---------+-----------+----------+-------------------+ LEFT     CompressibilityPhasicitySpontaneityPropertiesThrombus Aging      +---------+---------------+---------+-----------+----------+-------------------+ CFV      Full           Yes      Yes                                      +---------+---------------+---------+-----------+----------+-------------------+ SFJ      Full                                                             +---------+---------------+---------+-----------+----------+-------------------+ FV Prox  Full                                                             +---------+---------------+---------+-----------+----------+-------------------+ FV Mid   Full           Yes      Yes                                      +---------+---------------+---------+-----------+----------+-------------------+  FV Distal               Yes      Yes                  Not well visualized +---------+---------------+---------+-----------+----------+-------------------+ PFV      Full                                                             +---------+---------------+---------+-----------+----------+-------------------+ POP      Full                                                             +---------+---------------+---------+-----------+----------+-------------------+ PTV      Full                                                              +---------+---------------+---------+-----------+----------+-------------------+ PERO     Full                                         Not well visualized +---------+---------------+---------+-----------+----------+-------------------+     Summary: RIGHT: - No evidence of common femoral vein obstruction.  LEFT: - There is no evidence of deep vein thrombosis in the lower extremity. However, portions of this examination were limited- see technologist comments above.  - No cystic structure found in the popliteal fossa.  *See table(s) above for measurements and observations. Electronically signed by Gerarda Fraction on 08/17/2022 at 8:52:10 AM.    Final    ECHOCARDIOGRAM COMPLETE  Result Date: 08/16/2022    ECHOCARDIOGRAM REPORT   Patient Name:   LANIS HOTTE Date of Exam: 08/16/2022 Medical Rec #:  [id number]           Height:       69.5 in Accession #:    [id number]          Weight:       206.0 lb Date of Birth:  1950/12/22            BSA:          2.102 m Patient Age:    71 years            BP:           135/59 mmHg Patient Gender: M                   HR:           71 bpm. Exam Location:  Inpatient Procedure: 2D Echo, Cardiac Doppler and Color Doppler Indications:    Dyspnea R06.00  History:        Patient has prior history of Echocardiogram examinations, most                 recent 01/10/2020. Cardiomegaly, CAD and Previous Myocardial  Infarction; Risk Factors:Hypertension, Diabetes, Dyslipidemia                 and Former Smoker.  Sonographer:    Celesta Gentile RCS Referring Phys: [phone] DANIEL V THOMPSON  Sonographer Comments: Suboptimal subcostal window. Patient abdomen is very distended and somewhat tight. Difficult to image. IMPRESSIONS  1. Left ventricular ejection fraction, by estimation, is 70 to 75%. The left ventricle has hyperdynamic function. The left ventricle has no regional wall motion abnormalities. Left ventricular diastolic parameters are consistent with Grade I  diastolic dysfunction (impaired relaxation).  2. Right ventricular systolic function is hyperdynamic. The right ventricular size is normal. Tricuspid regurgitation signal is inadequate for assessing PA pressure.  3. Left atrial size was severely dilated.  4. Right atrial size was moderately dilated.  5. The mitral valve is grossly normal. No evidence of mitral valve regurgitation. No evidence of mitral stenosis.  6. The aortic valve is tricuspid. Aortic valve regurgitation is not visualized. No aortic stenosis is present.  7. Aortic dilatation noted. There is borderline dilatation of the aortic root, measuring 39 mm. Comparison(s): No significant change from prior study. FINDINGS  Left Ventricle: Left ventricular ejection fraction, by estimation, is 70 to 75%. The left ventricle has hyperdynamic function. The left ventricle has no regional wall motion abnormalities. Definity contrast agent was given IV to delineate the left ventricular endocardial borders. The left ventricular internal cavity size was normal in size. There is no left ventricular hypertrophy. Left ventricular diastolic parameters are consistent with Grade I diastolic dysfunction (impaired relaxation). Right Ventricle: The right ventricular size is normal. No increase in right ventricular wall thickness. Right ventricular systolic function is hyperdynamic. Tricuspid regurgitation signal is inadequate for assessing PA pressure. Left Atrium: Left atrial size was severely dilated. Right Atrium: Right atrial size was moderately dilated. Pericardium: There is no evidence of pericardial effusion. Mitral Valve: The mitral valve is grossly normal. No evidence of mitral valve regurgitation. No evidence of mitral valve stenosis. Tricuspid Valve: The tricuspid valve is not well visualized. Tricuspid valve regurgitation is not demonstrated. No evidence of tricuspid stenosis. Aortic Valve: The aortic valve is tricuspid. Aortic valve regurgitation is not  visualized. No aortic stenosis is present. Pulmonic Valve: The pulmonic valve was not well visualized. Pulmonic valve regurgitation is not visualized. No evidence of pulmonic stenosis. Aorta: Aortic dilatation noted. There is borderline dilatation of the aortic root, measuring 39 mm. Venous: The inferior vena cava was not well visualized. IAS/Shunts: The interatrial septum was not well visualized.  LEFT VENTRICLE PLAX 2D LVIDd:         5.10 cm   Diastology LVIDs:         3.50 cm   LV e' medial:    8.70 cm/s LV PW:         1.10 cm   LV E/e' medial:  11.5 LV IVS:        1.00 cm   LV e' lateral:   14.00 cm/s LVOT diam:     2.10 cm   LV E/e' lateral: 7.1 LV SV:         106 LV SV Index:   50 LVOT Area:     3.46 cm  RIGHT VENTRICLE RV S prime:     23.50 cm/s TAPSE (M-mode): 3.2 cm LEFT ATRIUM              Index        RIGHT ATRIUM  Index LA diam:        4.40 cm  2.09 cm/m   RA Area:     24.60 cm LA Vol (A2C):   95.2 ml  45.28 ml/m  RA Volume:   81.00 ml  38.53 ml/m LA Vol (A4C):   109.0 ml 51.84 ml/m LA Biplane Vol: 102.0 ml 48.51 ml/m  AORTIC VALVE LVOT Vmax:   148.00 cm/s LVOT Vmean:  95.700 cm/s LVOT VTI:    0.305 m  AORTA Ao Root diam: 3.90 cm MITRAL VALVE MV Area (PHT): 3.05 cm     SHUNTS MV Decel Time: 249 msec     Systemic VTI:  0.30 m MV E velocity: 100.00 cm/s  Systemic Diam: 2.10 cm MV A velocity: 119.00 cm/s MV E/A ratio:  0.84 Vishnu Priya Mallipeddi Electronically signed by Winfield Rast Mallipeddi Signature Date/Time: 08/16/2022/4:56:45 PM    Final    CT Angio Chest PE W and/or Wo Contrast  Result Date: 08/16/2022 CLINICAL DATA:  Suspected pulmonary embolism. EXAM: CT ANGIOGRAPHY CHEST WITH CONTRAST TECHNIQUE: Multidetector CT imaging of the chest was performed using the standard protocol during bolus administration of intravenous contrast. Multiplanar CT image reconstructions and MIPs were obtained to evaluate the vascular anatomy. RADIATION DOSE REDUCTION: This exam was performed  according to the departmental dose-optimization program which includes automated exposure control, adjustment of the mA and/or kV according to patient size and/or use of iterative reconstruction technique. CONTRAST:  75mL OMNIPAQUE IOHEXOL 350 MG/ML SOLN COMPARISON:  None Available. FINDINGS: Cardiovascular: There is marked severity calcification of the aortic arch and descending thoracic aorta, without evidence of aortic aneurysm. Satisfactory opacification of the pulmonary arteries to the segmental level. No evidence of pulmonary embolism. Normal heart size with moderate severity coronary artery calcification. No pericardial effusion. Mediastinum/Nodes: No enlarged mediastinal, hilar, or axillary lymph nodes. Thyroid gland, trachea, and esophagus demonstrate no significant findings. Lungs/Pleura: Mild atelectasis is seen within the bilateral lung bases. There is no evidence of an acute infiltrate, pleural effusion or pneumothorax. Upper Abdomen: No acute abnormality. Musculoskeletal: No chest wall abnormality. No acute or significant osseous findings. Review of the MIP images confirms the above findings. IMPRESSION: 1. No evidence of pulmonary embolism or other acute intrathoracic process. 2. Moderate severity coronary artery calcification. 3. Aortic atherosclerosis. Aortic Atherosclerosis (ICD10-I70.0). Electronically Signed   By: Aram Candela M.D.   On: 08/16/2022 01:01   DG Chest Port 1 View  Result Date: 08/15/2022 CLINICAL DATA:  Possible sepsis. EXAM: PORTABLE CHEST 1 VIEW COMPARISON:  08/03/2020 FINDINGS: Low volume film. The cardio pericardial silhouette is enlarged. The lungs are clear without focal pneumonia, edema, pneumothorax or pleural effusion. No acute bony abnormality. IMPRESSION: No active disease. Electronically Signed   By: Kennith Center M.D.   On: 08/15/2022 20:04   DG Knee 1-2 Views Left  Result Date: 08/14/2022 CLINICAL DATA:  Status post left total knee replacement. EXAM: LEFT  KNEE - 1-2 VIEW COMPARISON:  None Available. FINDINGS: The left femoral and tibial components are well situated. Expected postoperative changes are noted in the soft tissues anteriorly. IMPRESSION: Status post left total knee arthroplasty. Electronically Signed   By: Lupita Raider M.D.   On: 08/14/2022 08:30     Assessment and Plan:   Shortness of breath: Patient had an echo that showed a hyperdynamic LV function with grade 1 diastolic dysfunction.  He has had a CTPA that showed no evidence of PE and also no pulmonary edema.  His BNP also is reduced and  not significantly elevated from his prior BNP a few days ago.  On exam, he does not have lower extremity edema and does not have crackles in his lungs nor does he have JVD.  Is unclear to me as to the cause of his shortness of breath.  He is on Lasix currently.  Ashley Montminy continue his current dose of Lasix to see if this has any improvement. Coronary artery disease: No current chest pain.  Troponins have remained negative.  Continue aspirin. Hypertension: Blood pressure overall well-controlled PVCs: Noted on telemetry.  Wore a monitor in 2020 with a less than 1% burden.  Would have him wear a 2-week monitor at discharge for PVC burden.   Risk Assessment/Risk Scores:                For questions or updates, please contact [facility] Please consult [email or web address] for contact info under    Signed, Kierre Hintz Jorja Loa, MD  08/17/2022 10:18 AM

## 2022-08-17 NOTE — Hospital Course (Signed)
Patient 72 year old gentleman history of type 2 diabetes, hypertension underwent left TKA 2 days prior to admission presented to the ED with generalized weakness, cough, shortness of breath.  Patient noted to be hypoxic in triage requiring O2.  Patient noted to have had a fever of 101 at home prior to admission and noted to have a temp of 100.4 on presentation to the ED.  Patient noted to have some pain in the left knee with redness and swelling.  Patient admitted for further evaluation and workup.  Lower extremity Dopplers done negative for DVT.  CT angiogram chest negative for PE or infiltrate.  Orthopedics consulted.  Cardiology consulted.

## 2022-08-17 NOTE — Assessment & Plan Note (Signed)
-  Secondary to diuretics. -Repleted. -Patient placed on scheduled potassium supplementation daily while on diuretics. -Patient will get repeat labs done in 1 week with his cardiologist.

## 2022-08-17 NOTE — Assessment & Plan Note (Signed)
-  Questionable etiology. -Likely secondary to fluid resuscitation during recent hospitalization perioperatively. -Patient denies any chest pain. -CT angiogram chest done negative for PE or infiltrate. -Lower extremity Dopplers done negative for DVT. -2D echo with EF of 70 to 75%, grade 1 diastolic dysfunction, hyperdynamic right ventricular systolic function, severely dilated left atrial size, moderately dilated right atrial size. -Patient does state prior to surgery was having some shortness of breath with exertion. -Patient placed on Lasix 20 mg IV every 12 hours on 08/16/2022 with urine output of 3.250 L over the past 24 hours. -Cardiology consulted for further evaluation and management.

## 2022-08-17 NOTE — Assessment & Plan Note (Signed)
-  Patient maintained on PPI.       

## 2022-08-17 NOTE — Assessment & Plan Note (Signed)
-  Likely secondary to hypervolemic hyponatremia in the setting of HCTZ. -HCTZ was held and will not be resumed on discharge.   -Received IV Lasix which was subsequently transition to oral Lasix with improvement with hyponatremia.  -Outpatient follow-up with PCP.

## 2022-08-18 ENCOUNTER — Telehealth: Payer: Self-pay | Admitting: Cardiovascular Disease

## 2022-08-18 ENCOUNTER — Inpatient Hospital Stay (INDEPENDENT_AMBULATORY_CARE_PROVIDER_SITE_OTHER): Payer: Medicare Other

## 2022-08-18 ENCOUNTER — Other Ambulatory Visit: Payer: Self-pay | Admitting: Cardiology

## 2022-08-18 DIAGNOSIS — E876 Hypokalemia: Secondary | ICD-10-CM

## 2022-08-18 DIAGNOSIS — R21 Rash and other nonspecific skin eruption: Secondary | ICD-10-CM | POA: Diagnosis not present

## 2022-08-18 DIAGNOSIS — R0602 Shortness of breath: Secondary | ICD-10-CM | POA: Diagnosis not present

## 2022-08-18 DIAGNOSIS — I493 Ventricular premature depolarization: Secondary | ICD-10-CM

## 2022-08-18 DIAGNOSIS — K219 Gastro-esophageal reflux disease without esophagitis: Secondary | ICD-10-CM | POA: Diagnosis not present

## 2022-08-18 DIAGNOSIS — Z96652 Presence of left artificial knee joint: Secondary | ICD-10-CM | POA: Diagnosis not present

## 2022-08-18 DIAGNOSIS — R5082 Postprocedural fever: Secondary | ICD-10-CM | POA: Diagnosis not present

## 2022-08-18 LAB — GLUCOSE, CAPILLARY
Glucose-Capillary: 136 mg/dL — ABNORMAL HIGH (ref 70–99)
Glucose-Capillary: 141 mg/dL — ABNORMAL HIGH (ref 70–99)
Glucose-Capillary: 142 mg/dL — ABNORMAL HIGH (ref 70–99)
Glucose-Capillary: 144 mg/dL — ABNORMAL HIGH (ref 70–99)
Glucose-Capillary: 165 mg/dL — ABNORMAL HIGH (ref 70–99)
Glucose-Capillary: 180 mg/dL — ABNORMAL HIGH (ref 70–99)
Glucose-Capillary: 243 mg/dL — ABNORMAL HIGH (ref 70–99)

## 2022-08-18 LAB — CBC WITH DIFFERENTIAL/PLATELET
Abs Immature Granulocytes: 0.03 10*3/uL (ref 0.00–0.07)
Basophils Absolute: 0.1 10*3/uL (ref 0.0–0.1)
Basophils Relative: 1 %
Eosinophils Absolute: 0.3 10*3/uL (ref 0.0–0.5)
Eosinophils Relative: 4 %
HCT: 30.9 % — ABNORMAL LOW (ref 39.0–52.0)
Hemoglobin: 10.5 g/dL — ABNORMAL LOW (ref 13.0–17.0)
Immature Granulocytes: 0 %
Lymphocytes Relative: 13 %
Lymphs Abs: 1.1 10*3/uL (ref 0.7–4.0)
MCH: 31.5 pg (ref 26.0–34.0)
MCHC: 34 g/dL (ref 30.0–36.0)
MCV: 92.8 fL (ref 80.0–100.0)
Monocytes Absolute: 1.1 10*3/uL — ABNORMAL HIGH (ref 0.1–1.0)
Monocytes Relative: 12 %
Neutro Abs: 6 10*3/uL (ref 1.7–7.7)
Neutrophils Relative %: 70 %
Platelets: 252 10*3/uL (ref 150–400)
RBC: 3.33 MIL/uL — ABNORMAL LOW (ref 4.22–5.81)
RDW: 13.3 % (ref 11.5–15.5)
WBC: 8.6 10*3/uL (ref 4.0–10.5)
nRBC: 0 % (ref 0.0–0.2)

## 2022-08-18 LAB — BASIC METABOLIC PANEL
Anion gap: 10 (ref 5–15)
BUN: 12 mg/dL (ref 8–23)
CO2: 27 mmol/L (ref 22–32)
Calcium: 8.4 mg/dL — ABNORMAL LOW (ref 8.9–10.3)
Chloride: 96 mmol/L — ABNORMAL LOW (ref 98–111)
Creatinine, Ser: 1.23 mg/dL (ref 0.61–1.24)
GFR, Estimated: >60 mL/min (ref >60)
Glucose, Bld: 146 mg/dL — ABNORMAL HIGH (ref 70–99)
Potassium: 3.4 mmol/L — ABNORMAL LOW (ref 3.5–5.1)
Sodium: 133 mmol/L — ABNORMAL LOW (ref 135–145)

## 2022-08-18 LAB — CULTURE, BLOOD (ROUTINE X 2): Culture: NO GROWTH

## 2022-08-18 LAB — BRAIN NATRIURETIC PEPTIDE: B Natriuretic Peptide: 110.5 pg/mL — ABNORMAL HIGH (ref 0.0–100.0)

## 2022-08-18 LAB — MAGNESIUM: Magnesium: 2 mg/dL (ref 1.7–2.4)

## 2022-08-18 MED ORDER — DIPHENHYDRAMINE HCL 25 MG PO CAPS
25.0000 mg | ORAL_CAPSULE | Freq: Two times a day (BID) | ORAL | Status: DC
  Administered 2022-08-18 – 2022-08-19 (×2): 25 mg via ORAL
  Filled 2022-08-18 (×2): qty 1

## 2022-08-18 MED ORDER — METHYLPREDNISOLONE SODIUM SUCC 40 MG IJ SOLR
40.0000 mg | INTRAMUSCULAR | Status: DC
  Administered 2022-08-18: 40 mg via INTRAVENOUS
  Filled 2022-08-18: qty 1

## 2022-08-18 MED ORDER — FUROSEMIDE 40 MG PO TABS
40.0000 mg | ORAL_TABLET | Freq: Every day | ORAL | Status: DC
  Administered 2022-08-18 – 2022-08-19 (×2): 40 mg via ORAL
  Filled 2022-08-18 (×2): qty 1

## 2022-08-18 MED ORDER — IRBESARTAN 150 MG PO TABS
150.0000 mg | ORAL_TABLET | Freq: Every day | ORAL | Status: DC
  Administered 2022-08-18 – 2022-08-19 (×2): 150 mg via ORAL
  Filled 2022-08-18 (×2): qty 1

## 2022-08-18 MED ORDER — FAMOTIDINE 20 MG PO TABS
20.0000 mg | ORAL_TABLET | Freq: Two times a day (BID) | ORAL | Status: DC
  Administered 2022-08-18 – 2022-08-19 (×2): 20 mg via ORAL
  Filled 2022-08-18 (×2): qty 1

## 2022-08-18 MED ORDER — METOPROLOL TARTRATE 12.5 MG HALF TABLET
12.5000 mg | ORAL_TABLET | Freq: Two times a day (BID) | ORAL | Status: DC
  Administered 2022-08-18 – 2022-08-19 (×2): 12.5 mg via ORAL
  Filled 2022-08-18 (×2): qty 1

## 2022-08-18 MED ORDER — FAMOTIDINE IN NACL 20-0.9 MG/50ML-% IV SOLN
20.0000 mg | Freq: Two times a day (BID) | INTRAVENOUS | Status: DC
  Administered 2022-08-18: 20 mg via INTRAVENOUS
  Filled 2022-08-18 (×2): qty 50

## 2022-08-18 MED ORDER — DIPHENHYDRAMINE HCL 50 MG/ML IJ SOLN
25.0000 mg | Freq: Once | INTRAMUSCULAR | Status: AC
  Administered 2022-08-18: 25 mg via INTRAVENOUS
  Filled 2022-08-18: qty 1

## 2022-08-18 NOTE — Plan of Care (Signed)
  Problem: Activity: Goal: Ability to avoid complications of mobility impairment will improve Outcome: Progressing Goal: Range of joint motion will improve Outcome: Progressing   Problem: Pain Management: Goal: Pain level will decrease with appropriate interventions Outcome: Progressing   

## 2022-08-18 NOTE — Assessment & Plan Note (Signed)
-  Patient noted to have developed a rash on his back felt likely a contact dermatitis from bedsheets. -Patient with complaints of pruritus.   -Patient received a dose of IV Pepcid, IV Benadryl, IV Solu-Medrol and will be discharged home on a Pepcid and Benadryl taper.  -Outpatient follow-up with PCP.

## 2022-08-18 NOTE — Evaluation (Signed)
Physical Therapy Evaluation Patient Details Name: Dakota Gibbs MRN: [id number] DOB: 1951-02-12 Today's Date: 08/18/2022  History of Present Illness  72 yo male presents to [facility] on 4/12 with SOB, fevers, and weakness, s/p LTKR on 4/10. Imaging negative for PE, DVT; no cardiac source for SOB. PMH includes anxiety and depression, LBP s/p sx (2023), CAD, DM II, HDL, HTN, MI, and  OSA.  Clinical Impression   Pt presents with generalized weakness, impaired balance, min dyspnea on exertion but WFL SPO2 on RA post-gait (95% and greater) and pt reports much better than initial admission, antalgic gait, and decreased activity tolerance. Pt to benefit from acute PT to address deficits. Pt ambulated hallway distance with use of RW, requires light physical assist for bed mobility and transfers and states his wife is able to provide this at home. Pt endorses feeling much better than initial admission, but states he still does get out of breath with significant exertion. Pt also states this is his baseline even before surgery. PT to progress mobility as tolerated, and will continue to follow acutely.         Recommendations for follow up therapy are one component of a multi-disciplinary discharge planning process, led by the attending physician.  Recommendations may be updated based on patient status, additional functional criteria and insurance authorization.  Follow Up Recommendations       Assistance Recommended at Discharge Intermittent Supervision/Assistance  Patient can return home with the following  A little help with walking and/or transfers;A little help with bathing/dressing/bathroom;Assistance with cooking/housework;Help with stairs or ramp for entrance;Assist for transportation    Equipment Recommendations None recommended by PT  Recommendations for Other Services       Functional Status Assessment Patient has had a recent decline in their functional status and/or demonstrates limited  ability to make significant improvements in function in a reasonable and predictable amount of time     Precautions / Restrictions Precautions Precautions: Fall;Knee Restrictions Weight Bearing Restrictions: No LLE Weight Bearing: Weight bearing as tolerated      Mobility  Bed Mobility Overal bed mobility: Needs Assistance Bed Mobility: Supine to Sit, Sit to Supine     Supine to sit: Min assist Sit to supine: Min assist   General bed mobility comments: assist for trunk elevation, LE lift back into bed. pt using leg lifter    Transfers Overall transfer level: Needs assistance Equipment used: Rolling walker (2 wheels) Transfers: Sit to/from Stand Sit to Stand: Min guard           General transfer comment: for safety, slow to rise    Ambulation/Gait Ambulation/Gait assistance: Min guard Gait Distance (Feet): 85 Feet Assistive device: Rolling walker (2 wheels) Gait Pattern/deviations: Step-through pattern, Decreased stride length, Antalgic Gait velocity: decr     General Gait Details: cues for upright posture, DOE 2/4. SpO2 95% and greater on RA when assessed post-gait and during repeated transfers  Stairs            Wheelchair Mobility    Modified Rankin (Stroke Patients Only)       Balance Overall balance assessment: Needs assistance Sitting-balance support: No upper extremity supported, Feet supported Sitting balance-Leahy Scale: Fair     Standing balance support: Bilateral upper extremity supported, During functional activity, Reliant on assistive device for balance Standing balance-Leahy Scale: Poor  Pertinent Vitals/Pain Pain Assessment Pain Assessment: Faces Faces Pain Scale: Hurts little more Pain Location: L knee Pain Descriptors / Indicators: Operative site guarding, Aching, Sore Pain Intervention(s): Limited activity within patient's tolerance, Monitored during session, Repositioned    Home  Living Family/patient expects to be discharged to:: Private residence Living Arrangements: Spouse/significant other Available Help at Discharge: Family Type of Home: House Home Access: Stairs to enter Entrance Stairs-Rails: None Entrance Stairs-Number of Steps: 2   Home Layout: Two level;Able to live on main level with bedroom/bathroom Home Equipment: Rolling Walker (2 wheels)      Prior Function Prior Level of Function : Needs assist             Mobility Comments: using RW for mobility post-op       Hand Dominance   Dominant Hand: Right    Extremity/Trunk Assessment   Upper Extremity Assessment Upper Extremity Assessment: Defer to OT evaluation    Lower Extremity Assessment Lower Extremity Assessment: Generalized weakness;LLE deficits/detail LLE Deficits / Details: anticipated post-op weakness; able to perform ankle pumps, quad set, active assist heel slide to 30 deg flexion LLE: Unable to fully assess due to pain    Cervical / Trunk Assessment Cervical / Trunk Assessment: Normal  Communication      Cognition Arousal/Alertness: Awake/alert Behavior During Therapy: WFL for tasks assessed/performed Overall Cognitive Status: Within Functional Limits for tasks assessed                                          General Comments      Exercises     Assessment/Plan    PT Assessment Patient needs continued PT services  PT Problem List Decreased strength;Decreased range of motion;Decreased activity tolerance;Decreased balance;Decreased mobility;Decreased coordination;Pain       PT Treatment Interventions DME instruction;Gait training;Stair training;Functional mobility training;Therapeutic activities;Therapeutic exercise;Balance training;Neuromuscular re-education;Patient/family education;Modalities    PT Goals (Current goals can be found in the Care Plan section)  Acute Rehab PT Goals PT Goal Formulation: With patient Time For Goal  Achievement: 09/01/22 Potential to Achieve Goals: Good    Frequency Min 4X/week     Co-evaluation               AM-PAC PT "6 Clicks" Mobility  Outcome Measure Help needed turning from your back to your side while in a flat bed without using bedrails?: A Little Help needed moving from lying on your back to sitting on the side of a flat bed without using bedrails?: A Little Help needed moving to and from a bed to a chair (including a wheelchair)?: A Little Help needed standing up from a chair using your arms (e.g., wheelchair or bedside chair)?: A Little Help needed to walk in hospital room?: A Little Help needed climbing 3-5 steps with a railing? : A Little 6 Click Score: 18    End of Session Equipment Utilized During Treatment: Gait belt Activity Tolerance: Patient tolerated treatment well Patient left: with call bell/phone within reach;in bed;with bed alarm set Nurse Communication: Mobility status PT Visit Diagnosis: Difficulty in walking, not elsewhere classified (R26.2);Pain Pain - Right/Left: Left Pain - part of body: Knee    Time: 6213-0865 PT Time Calculation (min) (ACUTE ONLY): 23 min   Charges:   PT Evaluation $PT Eval Low Complexity: 1 Low PT Treatments $Therapeutic Activity: 8-22 mins        Zayleigh Stroh S, PT DPT  Acute Rehabilitation Services Pager [phone]  Office [phone]   Truddie Coco 08/18/2022, 2:05 PM

## 2022-08-18 NOTE — Assessment & Plan Note (Signed)
-  Stable -Patient maintained on beta-blocker, Lasix, Zetia.   -Outpatient follow-up with cardiology.

## 2022-08-18 NOTE — Progress Notes (Addendum)
Progress Note  Patient Name: Dakota Gibbs Date of Encounter: 08/18/2022  [facility] Cardiologist: Nicki Guadalajara, MD   Patient Profile  Cardiac Studies & Procedures     STRESS TESTS  MYOCARDIAL PERFUSION IMAGING 01/27/2019  Narrative  The left ventricular ejection fraction is mildly decreased (45-54%).  Nuclear stress EF: 50%. Inferior wall hypokinesis  There was no ST segment deviation noted during stress.  Defect 1: There is a medium defect of severe severity present in the basal inferior, mid inferior and apical inferior location.  Findings consistent with prior inferior myocardial infarction.  This is an overall low risk study. No ischemia.  Donato Schultz, MD   ECHOCARDIOGRAM  ECHOCARDIOGRAM COMPLETE 08/16/2022  Narrative ECHOCARDIOGRAM REPORT    Patient Name:   Dakota Gibbs Date of Exam: 08/16/2022 Medical Rec #:  [id number]           Height:       69.5 in Accession #:    [id number]          Weight:       206.0 lb Date of Birth:  1950/05/08            BSA:          2.102 m Patient Age:    72 years            BP:           135/59 mmHg Patient Gender: M                   HR:           71 bpm. Exam Location:  Inpatient  Procedure: 2D Echo, Cardiac Doppler and Color Doppler  Indications:    Dyspnea R06.00  History:        Patient has prior history of Echocardiogram examinations, most recent 01/10/2020. Cardiomegaly, CAD and Previous Myocardial Infarction; Risk Factors:Hypertension, Diabetes, Dyslipidemia and Former Smoker.  Sonographer:    Celesta Gentile RCS Referring Phys: [phone] DANIEL V THOMPSON   Sonographer Comments: Suboptimal subcostal window. Patient abdomen is very distended and somewhat tight. Difficult to image. IMPRESSIONS   1. Left ventricular ejection fraction, by estimation, is 70 to 75%. The left ventricle has hyperdynamic function. The left ventricle has no regional wall motion abnormalities. Left ventricular diastolic  parameters are consistent with Grade I diastolic dysfunction (impaired relaxation). 2. Right ventricular systolic function is hyperdynamic. The right ventricular size is normal. Tricuspid regurgitation signal is inadequate for assessing PA pressure. 3. Left atrial size was severely dilated. 4. Right atrial size was moderately dilated. 5. The mitral valve is grossly normal. No evidence of mitral valve regurgitation. No evidence of mitral stenosis. 6. The aortic valve is tricuspid. Aortic valve regurgitation is not visualized. No aortic stenosis is present. 7. Aortic dilatation noted. There is borderline dilatation of the aortic root, measuring 39 mm.  Comparison(s): No significant change from prior study.  FINDINGS Left Ventricle: Left ventricular ejection fraction, by estimation, is 70 to 75%. The left ventricle has hyperdynamic function. The left ventricle has no regional wall motion abnormalities. Definity contrast agent was given IV to delineate the left ventricular endocardial borders. The left ventricular internal cavity size was normal in size. There is no left ventricular hypertrophy. Left ventricular diastolic parameters are consistent with Grade I diastolic dysfunction (impaired relaxation).  Right Ventricle: The right ventricular size is normal. No increase in right ventricular wall thickness. Right ventricular systolic function is hyperdynamic. Tricuspid regurgitation signal is inadequate  for assessing PA pressure.  Left Atrium: Left atrial size was severely dilated.  Right Atrium: Right atrial size was moderately dilated.  Pericardium: There is no evidence of pericardial effusion.  Mitral Valve: The mitral valve is grossly normal. No evidence of mitral valve regurgitation. No evidence of mitral valve stenosis.  Tricuspid Valve: The tricuspid valve is not well visualized. Tricuspid valve regurgitation is not demonstrated. No evidence of tricuspid stenosis.  Aortic Valve: The  aortic valve is tricuspid. Aortic valve regurgitation is not visualized. No aortic stenosis is present.  Pulmonic Valve: The pulmonic valve was not well visualized. Pulmonic valve regurgitation is not visualized. No evidence of pulmonic stenosis.  Aorta: Aortic dilatation noted. There is borderline dilatation of the aortic root, measuring 39 mm.  Venous: The inferior vena cava was not well visualized.  IAS/Shunts: The interatrial septum was not well visualized.   LEFT VENTRICLE PLAX 2D LVIDd:         5.10 cm   Diastology LVIDs:         3.50 cm   LV e' medial:    8.70 cm/s LV PW:         1.10 cm   LV E/e' medial:  11.5 LV IVS:        1.00 cm   LV e' lateral:   14.00 cm/s LVOT diam:     2.10 cm   LV E/e' lateral: 7.1 LV SV:         106 LV SV Index:   50 LVOT Area:     3.46 cm   RIGHT VENTRICLE RV S prime:     23.50 cm/s TAPSE (M-mode): 3.2 cm  LEFT ATRIUM              Index        RIGHT ATRIUM           Index LA diam:        4.40 cm  2.09 cm/m   RA Area:     24.60 cm LA Vol (A2C):   95.2 ml  45.28 ml/m  RA Volume:   81.00 ml  38.53 ml/m LA Vol (A4C):   109.0 ml 51.84 ml/m LA Biplane Vol: 102.0 ml 48.51 ml/m AORTIC VALVE LVOT Vmax:   148.00 cm/s LVOT Vmean:  95.700 cm/s LVOT VTI:    0.305 m  AORTA Ao Root diam: 3.90 cm  MITRAL VALVE MV Area (PHT): 3.05 cm     SHUNTS MV Decel Time: 249 msec     Systemic VTI:  0.30 m MV E velocity: 100.00 cm/s  Systemic Diam: 2.10 cm MV A velocity: 119.00 cm/s MV E/A ratio:  0.84  Vishnu Priya Mallipeddi Electronically signed by Winfield Rast Mallipeddi Signature Date/Time: 08/16/2022/4:56:45 PM    Final    MONITORS  CARDIAC EVENT MONITOR 11/29/2018  Narrative The patient was monitored from June 30 through November 15, 2018.  The predominant rhythm was sinus rhythm with the fastest episode sinus tachycardia at 110 bpm at 8:28PM on July 4 and the slowest episode sinus bradycardia at 46 bpm while sleeping on July 8 at 3:50 AM.   There were occasional PVCs representing 1%.  There was one episode of 4 beats of multifocal ventricular tachycardia on November 05, 2018 at 9:51 AM at a rate of 225.  Otherwise, PVCs were isolated.  There were no episodes of atrial fibrillation.  There were no significant pauses.           Subjective   SOB has  improved.  Denies any chest pain  Inpatient Medications    Scheduled Meds:  cholecalciferol  2,000 Units Oral Daily   citalopram  30 mg Oral Daily   cyanocobalamin  1,000 mcg Oral Daily   enoxaparin (LOVENOX) injection  40 mg Subcutaneous Q24H   ezetimibe  10 mg Oral QHS   fluticasone  1 spray Each Nare Daily   furosemide  20 mg Intravenous Q12H   insulin aspart  0-9 Units Subcutaneous Q4H   loratadine  10 mg Oral Daily   metoprolol tartrate  25 mg Oral QHS   omeprazole  20 mg Oral QHS   oxymetazoline  1 spray Each Nare QHS   polyethylene glycol  17 g Oral Daily   potassium chloride  40 mEq Oral Daily   Continuous Infusions:  piperacillin-tazobactam (ZOSYN)  IV 3.375 g (08/18/22 0843)   vancomycin 1,000 mg (08/18/22 0606)   PRN Meds: acetaminophen **OR** acetaminophen, methocarbamol, ondansetron **OR** ondansetron (ZOFRAN) IV, traMADol   Vital Signs    Vitals:   08/17/22 2001 08/17/22 2100 08/18/22 0447 08/18/22 0807  BP: (!) 146/54 (!) 146/54  (!) 141/62  Pulse: 80 78 64 71  Resp: 18     Temp: 99 F (37.2 C) 98.1 F (36.7 C) 99.1 F (37.3 C) 98.4 F (36.9 C)  TempSrc: Oral Oral Oral Oral  SpO2: 91% 96% 92% 94%  Weight:   97.3 kg   Height:        Intake/Output Summary (Last 24 hours) at 08/18/2022 1028 Last data filed at 08/18/2022 0700 Gross per 24 hour  Intake 620.68 ml  Output 1902 ml  Net -1281.32 ml      08/18/2022    4:47 AM 08/15/2022    7:06 PM 08/13/2022    8:13 AM  Last 3 Weights  Weight (lbs) 214 lb 8.1 oz 206 lb 206 lb  Weight (kg) 97.3 kg 93.441 kg 93.441 kg      Telemetry    NSR with trigeminal PVCs - Personally Reviewed  ECG    No  new EKG to review - Personally Reviewed  Physical Exam   GEN: No acute distress.   Neck: No JVD Cardiac: RRR, no murmurs, rubs, or gallops.  Respiratory: Clear to auscultation bilaterally. GI: Soft, nontender, non-distended  MS: No edema; No deformity. Neuro:  Nonfocal  Psych: Normal affect   Labs    High Sensitivity Troponin:   Recent Labs  Lab 08/15/22 2003 08/15/22 2320  TROPONINIHS 13 14      Chemistry Recent Labs  Lab 08/15/22 2003 08/16/22 1214 08/17/22 0123 08/18/22 0210  NA 126* 128* 128* 133*  K 4.2 3.7 3.3* 3.4*  CL 91* 93* 96* 96*  CO2 24 24 24 27   GLUCOSE 132* 131* 121* 146*  BUN 14 11 8 12   CREATININE 0.91 0.84 0.91 1.23  CALCIUM 8.6* 8.2* 7.9* 8.4*  PROT 6.2*  --  5.7*  --   ALBUMIN 2.9*  --  2.4*  --   AST 25  --  24  --   ALT 16  --  18  --   ALKPHOS 54  --  51  --   BILITOT 1.2  --  1.2  --   GFRNONAA >60 >60 >60 >60  ANIONGAP 11 11 8 10      Hematology Recent Labs  Lab 08/16/22 1214 08/17/22 0123 08/18/22 0210  WBC 11.0* 9.9 8.6  RBC 3.12* 3.23* 3.33*  HGB 10.1* 10.5* 10.5*  HCT 28.7* 29.6* 30.9*  MCV 92.0 91.6 92.8  MCH 32.4 32.5 31.5  MCHC 35.2 35.5 34.0  RDW 13.2 13.3 13.3  PLT 180 212 252    BNP Recent Labs  Lab 08/15/22 2003 08/17/22 0124 08/18/22 0210  BNP 312.3* 265.8* 110.5*     DDimer No results for input(s): "DDIMER" in the last 168 hours.   Radiology    ECHOCARDIOGRAM COMPLETE  Result Date: 08/16/2022    ECHOCARDIOGRAM REPORT   Patient Name:   Dakota Gibbs Date of Exam: 08/16/2022 Medical Rec #:  [id number]           Height:       69.5 in Accession #:    [id number]          Weight:       206.0 lb Date of Birth:  Jan 17, 1951            BSA:          2.102 m Patient Age:    72 years            BP:           135/59 mmHg Patient Gender: M                   HR:           71 bpm. Exam Location:  Inpatient Procedure: 2D Echo, Cardiac Doppler and Color Doppler Indications:    Dyspnea R06.00  History:         Patient has prior history of Echocardiogram examinations, most                 recent 01/10/2020. Cardiomegaly, CAD and Previous Myocardial                 Infarction; Risk Factors:Hypertension, Diabetes, Dyslipidemia                 and Former Smoker.  Sonographer:    Celesta Gentile RCS Referring Phys: [phone] DANIEL V THOMPSON  Sonographer Comments: Suboptimal subcostal window. Patient abdomen is very distended and somewhat tight. Difficult to image. IMPRESSIONS  1. Left ventricular ejection fraction, by estimation, is 70 to 75%. The left ventricle has hyperdynamic function. The left ventricle has no regional wall motion abnormalities. Left ventricular diastolic parameters are consistent with Grade I diastolic dysfunction (impaired relaxation).  2. Right ventricular systolic function is hyperdynamic. The right ventricular size is normal. Tricuspid regurgitation signal is inadequate for assessing PA pressure.  3. Left atrial size was severely dilated.  4. Right atrial size was moderately dilated.  5. The mitral valve is grossly normal. No evidence of mitral valve regurgitation. No evidence of mitral stenosis.  6. The aortic valve is tricuspid. Aortic valve regurgitation is not visualized. No aortic stenosis is present.  7. Aortic dilatation noted. There is borderline dilatation of the aortic root, measuring 39 mm. Comparison(s): No significant change from prior study. FINDINGS  Left Ventricle: Left ventricular ejection fraction, by estimation, is 70 to 75%. The left ventricle has hyperdynamic function. The left ventricle has no regional wall motion abnormalities. Definity contrast agent was given IV to delineate the left ventricular endocardial borders. The left ventricular internal cavity size was normal in size. There is no left ventricular hypertrophy. Left ventricular diastolic parameters are consistent with Grade I diastolic dysfunction (impaired relaxation). Right Ventricle: The right ventricular size is normal. No  increase in right ventricular wall thickness. Right ventricular systolic function is hyperdynamic. Tricuspid regurgitation signal is inadequate for assessing PA  pressure. Left Atrium: Left atrial size was severely dilated. Right Atrium: Right atrial size was moderately dilated. Pericardium: There is no evidence of pericardial effusion. Mitral Valve: The mitral valve is grossly normal. No evidence of mitral valve regurgitation. No evidence of mitral valve stenosis. Tricuspid Valve: The tricuspid valve is not well visualized. Tricuspid valve regurgitation is not demonstrated. No evidence of tricuspid stenosis. Aortic Valve: The aortic valve is tricuspid. Aortic valve regurgitation is not visualized. No aortic stenosis is present. Pulmonic Valve: The pulmonic valve was not well visualized. Pulmonic valve regurgitation is not visualized. No evidence of pulmonic stenosis. Aorta: Aortic dilatation noted. There is borderline dilatation of the aortic root, measuring 39 mm. Venous: The inferior vena cava was not well visualized. IAS/Shunts: The interatrial septum was not well visualized.  LEFT VENTRICLE PLAX 2D LVIDd:         5.10 cm   Diastology LVIDs:         3.50 cm   LV e' medial:    8.70 cm/s LV PW:         1.10 cm   LV E/e' medial:  11.5 LV IVS:        1.00 cm   LV e' lateral:   14.00 cm/s LVOT diam:     2.10 cm   LV E/e' lateral: 7.1 LV SV:         106 LV SV Index:   50 LVOT Area:     3.46 cm  RIGHT VENTRICLE RV S prime:     23.50 cm/s TAPSE (M-mode): 3.2 cm LEFT ATRIUM              Index        RIGHT ATRIUM           Index LA diam:        4.40 cm  2.09 cm/m   RA Area:     24.60 cm LA Vol (A2C):   95.2 ml  45.28 ml/m  RA Volume:   81.00 ml  38.53 ml/m LA Vol (A4C):   109.0 ml 51.84 ml/m LA Biplane Vol: 102.0 ml 48.51 ml/m  AORTIC VALVE LVOT Vmax:   148.00 cm/s LVOT Vmean:  95.700 cm/s LVOT VTI:    0.305 m  AORTA Ao Root diam: 3.90 cm MITRAL VALVE MV Area (PHT): 3.05 cm     SHUNTS MV Decel Time: 249 msec      Systemic VTI:  0.30 m MV E velocity: 100.00 cm/s  Systemic Diam: 2.10 cm MV A velocity: 119.00 cm/s MV E/A ratio:  0.84 Vishnu Priya Mallipeddi Electronically signed by Winfield Rast Mallipeddi Signature Date/Time: 08/16/2022/4:56:45 PM    Final     Patient Profile     72 y.o. male  with a hx of coronary artery disease post inferior MI with PCI to the RCA, [name] stenosis, hypertension, hyperlipidemia, GERD who is being seen 08/17/2022 for the evaluation of shortness of breath at the request of Ramiro Harvest.   Assessment & Plan    Shortness of breath:  -Patient had an echo that showed a hyperdynamic LV function with grade 1 diastolic dysfunction.   -He has had a CTPA that showed no evidence of PE and also no pulmonary edema.   -His BNP was mildly elevated at 312 on admit and now down to near normal at 110 -he does not appear volume overloaded on exam today -he put out 1.9L yesterday and is net neg 4.2L since admission -currently on Lasix  IV BID -  will change to Lasix 40mg  PO Daily along with supplemental potassium until seen back in the office -If DOE continues may need to consider noninvasive ischemic workup as outpt  Coronary artery disease:  -denies any chest pain -hsTrop negative -continue ASA and Zetia  Hypertension:  -BP controlled on exam today -change Lopressor to 12.5mg  BID -had been on HCTZ and amlodipine as outpt but stopped and BB started due to PVCs -restart Benicar 20mg  daily (home dose)  PVCs: Noted on telemetry.   -Wore a monitor in 2020 with a less than 1% burden.   -recommend 2 week ziopatch to assesss PVC load>>this may be the etiology of his SOB -continue Lopressor 12.5mg  BID>>cannot increase further due to HR in the low 60's  5.  Hypokalemia -K+ 3.4 this am -will replete to keep>4 -Mag 2  I have spent a total of 30 minutes with patient reviewing 2D echo , telemetry, EKGs, labs and examining patient as well as establishing an assessment and plan that was  discussed with the patient.  > 50% of time was spent in direct patient care.     [facility] will sign off.   Medication Recommendations:  Lasix 40mg  daily, Lopressor 12.5mg  BID, Benicar 20mg  daily, ASA 81mg  daily, Zetia 10mg  daily,  Other recommendations (labs, testing, etc):  BMET in 1 week and 2 week ziopatch Follow up as an outpatient:  Followup with Dr. Tresa Endo or extender in 1 week  For questions or updates, please contact [facility] Please consult [email or web address] for contact info under        Signed, Armanda Magic, MD  08/18/2022, 10:28 AM

## 2022-08-18 NOTE — Progress Notes (Signed)
Subjective:   5 days s/p L TKA readmitted on medical service over the weekend with fever Patient reports pain as mild.  Tramadol seems to be controlling pain well.  Objective: Vital signs in last 24 hours: Temp:  [98 F (36.7 C)-99.1 F (37.3 C)] 98.4 F (36.9 C) (04/15 0807) Pulse Rate:  [64-98] 71 (04/15 0807) Resp:  [18] 18 (04/14 2001) BP: (141-146)/(54-68) 141/62 (04/15 0807) SpO2:  [91 %-97 %] 94 % (04/15 0807) Weight:  [97.3 kg] 97.3 kg (04/15 0447)  Intake/Output from previous day: 04/14 0701 - 04/15 0700 In: 980.7 [P.O.:720; IV Piggyback:260.7] Out: 1902 [Urine:1901; Stool:1] Intake/Output this shift: No intake/output data recorded.  Recent Labs    08/15/22 2003 08/16/22 1214 08/17/22 0123 08/18/22 0210  HGB 10.9* 10.1* 10.5* 10.5*   Recent Labs    08/17/22 0123 08/18/22 0210  WBC 9.9 8.6  RBC 3.23* 3.33*  HCT 29.6* 30.9*  PLT 212 252   Recent Labs    08/17/22 0123 08/18/22 0210  NA 128* 133*  K 3.3* 3.4*  CL 96* 96*  CO2 24 27  BUN 8 12  CREATININE 0.91 1.23  GLUCOSE 121* 146*  CALCIUM 7.9* 8.4*   Recent Labs    08/15/22 2003 08/17/22 0123  INR 1.2 1.1    Neurologically intact ABD soft Neurovascular intact Sensation intact distally Intact pulses distally Dorsiflexion/Plantar flexion intact Incision: dressing C/D/I and scant drainage No cellulitis present Compartment soft No sign of DVT No erythema Mild swelling, appropriate for 5 days post-op No sign of infection  Assessment/Plan:    Advance diet Up with therapy Ice and elevation for swelling Continue tramadol for pain, unable to tolerate Norco on D/C last week Will continue to follow  Dorothy Spark 08/18/2022, 8:25 AM   Netta Cedars, MD Orthopaedic Surgery [facility]

## 2022-08-18 NOTE — Progress Notes (Addendum)
PROGRESS NOTE    Dakota Gibbs  NFA:[id number] DOB: December 19, 1950 DOA: 08/15/2022 PCP: Mliss Sax, MD    Chief Complaint  Patient presents with   Post-op Problem    Brief Narrative:  Patient 72 year old gentleman history of type 2 diabetes, hypertension underwent left TKA 2 days prior to admission presented to the ED with generalized weakness, cough, shortness of breath.  Patient noted to be hypoxic in triage requiring O2.  Patient noted to have had a fever of 101 at home prior to admission and noted to have a temp of 100.4 on presentation to the ED.  Patient noted to have some pain in the left knee with redness and swelling.  Patient admitted for further evaluation and workup.  Lower extremity Dopplers done negative for DVT.  CT angiogram chest negative for PE or infiltrate.  Orthopedics consulted.  Cardiology consulted.    Assessment & Plan:  Principal Problem:   Fever postop Active Problems:   SOB (shortness of breath)   DM2 (diabetes mellitus, type 2)   Primary hypertension   GERD   Coronary artery disease involving native coronary artery of native heart without angina pectoris   S/P TKR (total knee replacement) using cement, left   Hyponatremia   Sepsis   Hypokalemia   PVC (premature ventricular contraction)   Rash of back    Assessment and Plan: * Fever postop Fever of 100.4 + WBC 11k, technically meets SIRS criteria. Unknown source of infection.  Concern for left knee infection. Patient was placed on the sepsis pathway.   CT angiogram chest negative for PE or infiltrate. Lower extremity Dopplers negative for DVT. Urinalysis nitrite negative leukocytes negative. Blood cultures obtained pending with no growth to date..   Continue empiric IV vancomycin, IV Zosyn through today and discontinue after today's doses and monitor off antibiotics.. Procalcitonin noted at 0.19. Patient seen in consultation by orthopedics who assessed patient does not feel patient  with an acute infection at this time and recommended outpatient follow-up with orthopedics, Dr. Shelle Iron this week. Pulmonary toileting/incentive spirometry.  SOB (shortness of breath) -Questionable etiology. -Likely secondary to fluid resuscitation during recent hospitalization perioperatively. -Patient denies any chest pain. -CT angiogram chest done negative for PE or infiltrate. -Lower extremity Dopplers done negative for DVT. -2D echo with EF of 70 to 75%, grade 1 diastolic dysfunction, hyperdynamic right ventricular systolic function, severely dilated left atrial size, moderately dilated right atrial size. -Patient does state prior to surgery was having some shortness of breath with exertion. -Patient placed on Lasix 20 mg IV every 12 hours on 08/16/2022 with urine output of 1.9 L over the past 24 hours. -Patient is -4.357 L during this hospitalization. -Patient seen in consultation by cardiology, patient now euvolemic on exam and IV Lasix transition to oral Lasix 40 mg daily per cardiology. -BNP initially elevated at 312 on presentation and now down to 110. -Per cardiology if shortness of breath continues to occur may need noninvasive ischemic workup in the outpatient setting. -Cardiology following and appreciate input and recommendations.  Primary hypertension -BP was stable and slightly uptrending.   -Patient was on IV Lasix urine output 1.9 L over the past 24 hours.  -Lasix dose has been changed to oral Lasix 40 mg daily per cardiology recommendations.  -Continue Lopressor dose which has been decreased per cardiology to 12.5 mg twice daily.   -Discontinue HCTZ and amlodipine.   -Allergy resume home regimen Benicar 20 mg daily.  -Follow.   DM2 (diabetes mellitus, type  2) -Hemoglobin A1c 6.5 (08/06/2022 ). -CBG 142 this morning.   -Patient tolerating current heart healthy diet.  -Continue to hold oral hypoglycemic agents during the hospitalization and resume on discharge.    -SSI.  Rash of back -Patient noted to have developed a rash on his back felt likely a contact dermatitis from bedsheets. -Patient states back is itching. -IV Pepcid, IV Benadryl, IV Solu-Medrol.  PVC (premature ventricular contraction) -Noted on telemetry. -Per cardiology patient wore a monitor in 2020 with less than 1% burden. -Cardiology recommending 2-week Zio patch to assess PVC load which they feel could potentially be the etiology of his shortness of breath. -Beta-blocker dose decreased to 12.5 mg twice daily per cardiology.  Hypokalemia -Secondary to diuretics. -Replete. -Patient placed on scheduled potassium supplementation daily while on diuretics.  Sepsis -Sepsis ruled out  Hyponatremia -Likely secondary to hypervolemic hyponatremia in the setting of HCTZ. -Continue to hold HCTZ and likely will not resume on discharge. -Patient was on IV diuretics and transition to oral diuretics today per cardiology.  -Hyponatremia improved.   S/P TKR (total knee replacement) using cement, left -Patient assessed by orthopedics. -PT/OT. -Outpatient follow-up with orthopedics.  Coronary artery disease involving native coronary artery of native heart without angina pectoris -Stable -Continue beta-blocker, Lasix, Zetia.  GERD -Continue PPI.         DVT prophylaxis: Lovenox, likely transition back to aspirin twice daily on discharge. Code Status: Full Family Communication: Updated patient.  Updated wife at bedside.   Disposition: Home with home health hopefully in the next 24 hours if patient remains afebrile with continued clinical improvement.  Status is: Inpatient The patient will require care spanning > 2 midnights and should be moved to inpatient because: Severity of illness   Consultants:  Orthopedics: Dr. Odis Hollingshead 08/17/2022 Cardiology: Dr. Elberta Fortis 08/17/2022  Procedures:  CT angiogram chest 08/16/2022 Chest x-ray 08/15/2022 2D echo 08/16/2022 Lower extremity  Dopplers 08/16/2022:   Antimicrobials:  IV Zosyn 08/16/2022>>> 08/18/2022 IV vancomycin 08/16/2022>>>> 08/18/2022   Subjective: Patient sitting up in bed.  Complaining of an itching rash on his back felt likely secondary to contact dermatitis from the bedsheets.  Denies any chest pain.  Shortness of breath is improved.  No abdominal pain.  No bleeding.  Tolerating current oral intake.  Wife at bedside.    Objective: Vitals:   08/17/22 2100 08/18/22 0447 08/18/22 0807 08/18/22 1532  BP: (!) 146/54  (!) 141/62 (!) 156/67  Pulse: 78 64 71 71  Resp:   18   Temp: 98.1 F (36.7 C) 99.1 F (37.3 C) 98.4 F (36.9 C) 99 F (37.2 C)  TempSrc: Oral Oral Oral Oral  SpO2: 96% 92% 94% 92%  Weight:  97.3 kg    Height:        Intake/Output Summary (Last 24 hours) at 08/18/2022 1855 Last data filed at 08/18/2022 1100 Gross per 24 hour  Intake 120 ml  Output 1252 ml  Net -1132 ml   Filed Weights   08/15/22 1906 08/18/22 0447  Weight: 93.4 kg 97.3 kg    Examination:  General exam: NAD. Respiratory system: Lungs clear to auscultation bilaterally.  No wheezes, no crackles, no rhonchi.  Fair air movement.  Speaking in full sentences.   Cardiovascular system: RRR no murmurs rubs or gallops.  No JVD.  No lower extremity edema.  Gastrointestinal system: Abdomen is soft, nontender, nondistended, positive bowel sounds.  No rebound.  No guarding.  Central nervous system: Alert and oriented. No focal neurological deficits. Extremities:  Left lower extremity with 1-2+ edema, left knee with improved erythema, decreased warmth, no significant tenderness to palpation.  Knee immobilizer in place.  Skin: Rash noted on back.   Psychiatry: Judgement and insight appear normal. Mood & affect appropriate.     Data Reviewed:   CBC: Recent Labs  Lab 08/14/22 0805 08/15/22 2003 08/16/22 1214 08/17/22 0123 08/18/22 0210  WBC 10.0 11.9* 11.0* 9.9 8.6  NEUTROABS 7.5 9.3* 8.7* 7.5 6.0  HGB 12.3* 10.9*  10.1* 10.5* 10.5*  HCT 37.5* 31.4* 28.7* 29.6* 30.9*  MCV 95.4 91.8 92.0 91.6 92.8  PLT 220 203 180 212 252    Basic Metabolic Panel: Recent Labs  Lab 08/14/22 0805 08/15/22 2003 08/16/22 1214 08/17/22 0123 08/18/22 0210  NA 131* 126* 128* 128* 133*  K 4.4 4.2 3.7 3.3* 3.4*  CL 99 91* 93* 96* 96*  CO2 GLUCOSE 156* 132* 131* 121* 146*  BUN CREATININE 0.82 0.91 0.84 0.91 1.23  CALCIUM 8.0* 8.6* 8.2* 7.9* 8.4*  MG  --   --  1.9  --  2.0    GFR: Estimated Creatinine Clearance: 62.1 mL/min (by C-G formula based on SCr of 1.23 mg/dL).  Liver Function Tests: Recent Labs  Lab 08/15/22 2003 08/17/22 0123  AST 25 24  ALT 16 18  ALKPHOS 54 51  BILITOT 1.2 1.2  PROT 6.2* 5.7*  ALBUMIN 2.9* 2.4*    CBG: Recent Labs  Lab 08/18/22 0000 08/18/22 0443 08/18/22 0808 08/18/22 1133 08/18/22 1619  GLUCAP 141* 136* 142* 144* 165*     Recent Results (from the past 240 hour(s))  Urine Culture     Status: None   Collection Time: 08/15/22  7:41 PM   Specimen: Urine, Clean Catch  Result Value Ref Range Status   Specimen Description URINE, CLEAN CATCH  Final   Special Requests NONE  Final   Culture   Final    NO GROWTH Performed at [facility], [address], [place], [place] [address]    Report Status 08/17/2022 FINAL  Final  Resp panel by RT-PCR (RSV, Flu A&B, Covid)     Status: None   Collection Time: 08/15/22  7:42 PM   Specimen: Nasal Swab  Result Value Ref Range Status   SARS Coronavirus 2 by RT PCR NEGATIVE NEGATIVE Final   Influenza A by PCR NEGATIVE NEGATIVE Final   Influenza B by PCR NEGATIVE NEGATIVE Final    Comment: (NOTE) The Xpert Xpress SARS-CoV-2/FLU/RSV plus assay is intended as an aid in the diagnosis of influenza from Nasopharyngeal swab specimens and should not be used as a sole basis for treatment. Nasal washings and aspirates are unacceptable for Xpert Xpress SARS-CoV-2/FLU/RSV testing.  Fact Sheet for  Patients: BloggerCourse.com  Fact Sheet for Healthcare Providers: SeriousBroker.it  This test is not yet approved or cleared by the [place] FDA and has been authorized for detection and/or diagnosis of SARS-CoV-2 by FDA under an Emergency Use Authorization (EUA). This EUA will remain in effect (meaning this test can be used) for the duration of the COVID-19 declaration under Section 564(b)(1) of the Act, 21 U.S.C. section 360bbb-3(b)(1), unless the authorization is terminated or revoked.     Resp Syncytial Virus by PCR NEGATIVE NEGATIVE Final    Comment: (NOTE) Fact Sheet for Patients: BloggerCourse.com  Fact Sheet for Healthcare Providers: SeriousBroker.it  This test is not yet approved or cleared by the [place] and  has been authorized for detection and/or diagnosis of SARS-CoV-2 by FDA under an Emergency Use Authorization (EUA). This EUA will remain in effect (meaning this test can be used) for the duration of the COVID-19 declaration under Section 564(b)(1) of the Act, 21 U.S.C. section 360bbb-3(b)(1), unless the authorization is terminated or revoked.  Performed at [facility], [address] [place], [place] [address]   Blood Culture (routine x 2)     Status: None (Preliminary result)   Collection Time: 08/15/22  8:03 PM   Specimen: BLOOD  Result Value Ref Range Status   Specimen Description BLOOD RIGHT ANTECUBITAL  Final   Special Requests   Final    BOTTLES DRAWN AEROBIC AND ANAEROBIC Blood Culture results may not be optimal due to an excessive volume of blood received in culture bottles   Culture   Final    NO GROWTH 3 DAYS Performed at [facility], [address], [place], [place] [address]    Report Status PENDING  Incomplete  Blood Culture (routine x 2)     Status: None (Preliminary result)   Collection Time: 08/15/22  8:45 PM    Specimen: BLOOD  Result Value Ref Range Status   Specimen Description BLOOD LEFT ANTECUBITAL  Final   Special Requests   Final    BOTTLES DRAWN AEROBIC AND ANAEROBIC Blood Culture adequate volume   Culture   Final    NO GROWTH 3 DAYS Performed at [facility], [address] [place], [place] [address]    Report Status PENDING  Incomplete         Radiology Studies: No results found.      Scheduled Meds:  cholecalciferol  2,000 Units Oral Daily   citalopram  30 mg Oral Daily   cyanocobalamin  1,000 mcg Oral Daily   enoxaparin (LOVENOX) injection  40 mg Subcutaneous Q24H   ezetimibe  10 mg Oral QHS   fluticasone  1 spray Each Nare Daily   furosemide  40 mg Oral Daily   insulin aspart  0-9 Units Subcutaneous Q4H   irbesartan  150 mg Oral Daily   loratadine  10 mg Oral Daily   methylPREDNISolone (SOLU-MEDROL) injection  40 mg Intravenous Q24H   metoprolol tartrate  12.5 mg Oral BID   omeprazole  20 mg Oral QHS   oxymetazoline  1 spray Each Nare QHS   polyethylene glycol  17 g Oral Daily   potassium chloride  40 mEq Oral Daily   Continuous Infusions:  famotidine (PEPCID) IV 20 mg (08/18/22 1433)   piperacillin-tazobactam (ZOSYN)  IV 3.375 g (08/18/22 1747)   vancomycin 1,000 mg (08/18/22 1836)     LOS: 1 day    Time spent: 35 minutes    Ramiro Harvest, MD [facility]   To contact the attending provider between 7A-7P or the covering provider during after hours 7P-7A, please log into the web site [email or web address] and access using universal [place] password for that web site. If you do not have the password, please call the hospital operator.  08/18/2022, 6:55 PM

## 2022-08-18 NOTE — Assessment & Plan Note (Signed)
-  Noted on telemetry. -Per cardiology patient wore a monitor in 2020 with less than 1% burden. -Cardiology recommending 2-week Zio patch to assess PVC load which they feel could potentially be the etiology of his shortness of breath. -Beta-blocker dose decreased to 12.5 mg twice daily per cardiology.

## 2022-08-18 NOTE — Discharge Instructions (Signed)
Dr. Landry Dyke office will contact you about a monitor to wear for 2 weeks.  You should hear something in next couple of days if not please call the office      Heart Monitor:   Length of Wear: 14 days   Your monitor will be mailed to your home address within 3-5 business days. However, if you have not received your monitor after 5 business days please send [place] a MyChart message or call the office at [phone], so we may follow up on this for you.    Your physician has recommended that you wear a Zio AT monitor.    This monitor is a medical device that records the heart's electrical activity. Doctors most often use these monitors to diagnose arrhythmias. Arrhythmias are problems with the speed or rhythm of the heartbeat. The monitor is a small device applied to your chest. You can wear one while you do your normal daily activities. While wearing this monitor if you have any symptoms to push the button and record what you felt. Once you have worn this monitor for the period of time provider prescribed (Usually 14 days), you will return the monitor device in the postage paid box. Once it is returned they will download the data collected and provide [place] with a report which the provider will then review and we will call you with those results. Important tips:   1. Avoid showering during the first 24 hours of wearing the monitor. 2. Avoid excessive sweating to help maximize wear time. 3. Do not submerge the device, no hot tubs, and no swimming pools. 4. Keep any lotions or oils away from the patch. 5. After 24 hours you may shower with the patch on. Take brief showers with your back facing the shower head.  6. Do not remove patch once it has been placed because that will interrupt data and decrease adhesive wear time. 7. Push the button when you have any symptoms and write down what you were feeling. 8. Once you have completed wearing your monitor, remove and place into box which has postage paid and  place in your outgoing mailbox.  9. If for some reason you have misplaced your box then call our office and we can provide another box and/or mail it off for you.

## 2022-08-18 NOTE — Telephone Encounter (Signed)
Patient called to let Dr. Tresa Endo know that he was admitted in the hospital for a cardiac emergency. Wants to know if Dr. Tresa Endo knew and what to see if Dr. Tresa Endo would stop by to see him.

## 2022-08-18 NOTE — Progress Notes (Unsigned)
Enrolled for [employer] to mail a ZIO XT long term holter monitor to the patients address on file. Requested to be delivered 08/25/22.  Dr. Tresa Endo to read.

## 2022-08-19 DIAGNOSIS — R5082 Postprocedural fever: Secondary | ICD-10-CM | POA: Diagnosis not present

## 2022-08-19 DIAGNOSIS — R21 Rash and other nonspecific skin eruption: Secondary | ICD-10-CM

## 2022-08-19 DIAGNOSIS — I2510 Atherosclerotic heart disease of native coronary artery without angina pectoris: Secondary | ICD-10-CM

## 2022-08-19 DIAGNOSIS — E876 Hypokalemia: Secondary | ICD-10-CM | POA: Diagnosis not present

## 2022-08-19 DIAGNOSIS — R0602 Shortness of breath: Secondary | ICD-10-CM | POA: Diagnosis not present

## 2022-08-19 DIAGNOSIS — I10 Essential (primary) hypertension: Secondary | ICD-10-CM | POA: Diagnosis not present

## 2022-08-19 LAB — CBC WITH DIFFERENTIAL/PLATELET
Abs Immature Granulocytes: 0.04 10*3/uL (ref 0.00–0.07)
Basophils Absolute: 0 10*3/uL (ref 0.0–0.1)
Basophils Relative: 0 %
Eosinophils Absolute: 0 10*3/uL (ref 0.0–0.5)
Eosinophils Relative: 0 %
HCT: 32.1 % — ABNORMAL LOW (ref 39.0–52.0)
Hemoglobin: 11.3 g/dL — ABNORMAL LOW (ref 13.0–17.0)
Immature Granulocytes: 1 %
Lymphocytes Relative: 9 %
Lymphs Abs: 0.7 10*3/uL (ref 0.7–4.0)
MCH: 31.9 pg (ref 26.0–34.0)
MCHC: 35.2 g/dL (ref 30.0–36.0)
MCV: 90.7 fL (ref 80.0–100.0)
Monocytes Absolute: 0.4 10*3/uL (ref 0.1–1.0)
Monocytes Relative: 5 %
Neutro Abs: 6.5 10*3/uL (ref 1.7–7.7)
Neutrophils Relative %: 85 %
Platelets: 302 10*3/uL (ref 150–400)
RBC: 3.54 MIL/uL — ABNORMAL LOW (ref 4.22–5.81)
RDW: 13.2 % (ref 11.5–15.5)
WBC: 7.6 10*3/uL (ref 4.0–10.5)
nRBC: 0 % (ref 0.0–0.2)

## 2022-08-19 LAB — BASIC METABOLIC PANEL
Anion gap: 8 (ref 5–15)
BUN: 15 mg/dL (ref 8–23)
CO2: 28 mmol/L (ref 22–32)
Calcium: 8.7 mg/dL — ABNORMAL LOW (ref 8.9–10.3)
Chloride: 97 mmol/L — ABNORMAL LOW (ref 98–111)
Creatinine, Ser: 1.15 mg/dL (ref 0.61–1.24)
GFR, Estimated: >60 mL/min (ref >60)
Glucose, Bld: 170 mg/dL — ABNORMAL HIGH (ref 70–99)
Potassium: 3.8 mmol/L (ref 3.5–5.1)
Sodium: 133 mmol/L — ABNORMAL LOW (ref 135–145)

## 2022-08-19 LAB — CULTURE, BLOOD (ROUTINE X 2)

## 2022-08-19 LAB — GLUCOSE, CAPILLARY
Glucose-Capillary: 156 mg/dL — ABNORMAL HIGH (ref 70–99)
Glucose-Capillary: 150 mg/dL — ABNORMAL HIGH (ref 70–99)
Glucose-Capillary: 147 mg/dL — ABNORMAL HIGH (ref 70–99)

## 2022-08-19 LAB — MAGNESIUM: Magnesium: 2.1 mg/dL (ref 1.7–2.4)

## 2022-08-19 MED ORDER — POTASSIUM CHLORIDE CRYS ER 20 MEQ PO TBCR: 40.0000 meq | EXTENDED_RELEASE_TABLET | Freq: Every day | ORAL | 2 refills | Status: DC

## 2022-08-19 MED ORDER — METOPROLOL TARTRATE 25 MG PO TABS: 12.5000 mg | ORAL_TABLET | Freq: Two times a day (BID) | ORAL | 1 refills | Status: DC

## 2022-08-19 MED ORDER — FUROSEMIDE 40 MG PO TABS: 40.0000 mg | ORAL_TABLET | Freq: Every day | ORAL | 2 refills | Status: DC

## 2022-08-19 MED ORDER — DIPHENHYDRAMINE HCL 25 MG PO CAPS: ORAL_CAPSULE | ORAL | 0 refills | Status: DC

## 2022-08-19 MED ORDER — HYDROCORTISONE 1 % EX CREA: 1.0000 | TOPICAL_CREAM | Freq: Two times a day (BID) | CUTANEOUS | 0 refills | Status: AC

## 2022-08-19 MED ORDER — FAMOTIDINE 20 MG PO TABS: ORAL_TABLET | ORAL | 0 refills | Status: DC

## 2022-08-19 NOTE — Progress Notes (Signed)
Pt discharged to home. DC instructions given. Verbalized no concerns. Pt left unit in wheelchair pushed by hospital volunteer. Left in stable condition.

## 2022-08-19 NOTE — Evaluation (Signed)
Occupational Therapy Evaluation Patient Details Name: Dakota Gibbs MRN: [id number] DOB: 08-18-1950 Today's Date: 08/19/2022   History of Present Illness 72 yo male presents to [facility] on 4/12 with SOB, fevers, and weakness, s/p LTKR on 4/10. Imaging negative for PE, DVT; no cardiac source for SOB. PMH includes anxiety and depression, LBP s/p sx (2023), CAD, DM II, HDL, HTN, MI, and  OSA.   Clinical Impression   Pt admitted due to fever and fatigue, hx of recent LTKR 4/10. Pt feeling much better, minimal pain/fatigue, able to perform ADLs/mobility without overexertion, denies need for [facility] therapy, good balance, mod I for mobility and ADLs today other than assistance from wife with L sock/shoe for LB dressing. Pt now has BSC to reduce need to frequently ambulate 20 feet to bathroom multiple times daily. No further OT needed, no follow up OT required. Pt has all DME at home needed to be mod I.     Recommendations for follow up therapy are one component of a multi-disciplinary discharge planning process, led by the attending physician.  Recommendations may be updated based on patient status, additional functional criteria and insurance authorization.   Assistance Recommended at Discharge PRN  Patient can return home with the following A little help with bathing/dressing/bathroom;Assistance with cooking/housework;Assist for transportation;Help with stairs or ramp for entrance    Functional Status Assessment  Patient has had a recent decline in their functional status and demonstrates the ability to make significant improvements in function in a reasonable and predictable amount of time.  Equipment Recommendations  None recommended by OT    Recommendations for Other Services       Precautions / Restrictions Precautions Precautions: Fall;Knee Restrictions Weight Bearing Restrictions: No LLE Weight Bearing: Weight bearing as tolerated      Mobility Bed Mobility Overal bed mobility:  Modified Independent             General bed mobility comments: able to use leg lifter to perform bed mobility mod I    Transfers Overall transfer level: Modified independent                 General transfer comment: able to stand without help of RW, good balance      Balance Overall balance assessment: Modified Independent Sitting-balance support: No upper extremity supported, Feet supported Sitting balance-Leahy Scale: Normal     Standing balance support: No upper extremity supported, During functional activity Standing balance-Leahy Scale: Good                             ADL either performed or assessed with clinical judgement   ADL Overall ADL's : Needs assistance/impaired Eating/Feeding: Modified independent   Grooming: Modified independent   Upper Body Bathing: Modified independent   Lower Body Bathing: Modified independent   Upper Body Dressing : Modified independent   Lower Body Dressing: Minimal assistance   Toilet Transfer: Modified Independent   Toileting- Clothing Manipulation and Hygiene: Modified independent   Tub/ Shower Transfer: Modified independent   Functional mobility during ADLs: Modified independent General ADL Comments: mod I for all activities except don/doff L sock/shoes due to recent knee surgery     Vision Baseline Vision/History: 1 Wears glasses;4 Cataracts (had corrective surgery, 20/20 now) Ability to See in Adequate Light: 0 Adequate Patient Visual Report: No change from baseline       Perception     Praxis      Pertinent Vitals/Pain Pain Assessment  Pain Assessment: 0-10 Pain Score: 2  Faces Pain Scale: Hurts a little bit Pain Location: L knee Pain Descriptors / Indicators: Operative site guarding, Aching, Sore Pain Intervention(s): Monitored during session     Hand Dominance Right   Extremity/Trunk Assessment Upper Extremity Assessment Upper Extremity Assessment: Overall WFL for tasks  assessed           Communication Communication Communication: HOH   Cognition Arousal/Alertness: Awake/alert Behavior During Therapy: WFL for tasks assessed/performed Overall Cognitive Status: Within Functional Limits for tasks assessed                                       General Comments       Exercises     Shoulder Instructions      Home Living Family/patient expects to be discharged to:: Private residence Living Arrangements: Spouse/significant other Available Help at Discharge: Family Type of Home: House Home Access: Stairs to enter Secretary/administrator of Steps: 2 Entrance Stairs-Rails: None Home Layout: Two level;Able to live on main level with bedroom/bathroom               Home Equipment: Rolling Walker (2 wheels);Shower seat;BSC/3in1          Prior Functioning/Environment Prior Level of Function : Needs assist       Physical Assist : Mobility (physical);ADLs (physical) Mobility (physical): Transfers;Gait;Stairs ADLs (physical): Dressing;IADLs Mobility Comments: used RW post L knee surgery during recovery ADLs Comments: min A for LB dressing post L knee surgery        OT Problem List: Decreased activity tolerance;Pain      OT Treatment/Interventions:      OT Goals(Current goals can be found in the care plan section) Acute Rehab OT Goals Patient Stated Goal: to return to PLOF OT Goal Formulation: With patient Time For Goal Achievement: 09/02/22 Potential to Achieve Goals: Good  OT Frequency:      Co-evaluation              AM-PAC OT "6 Clicks" Daily Activity     Outcome Measure Help from another person eating meals?: None Help from another person taking care of personal grooming?: None Help from another person toileting, which includes using toliet, bedpan, or urinal?: None Help from another person bathing (including washing, rinsing, drying)?: None Help from another person to put on and taking off regular  upper body clothing?: None Help from another person to put on and taking off regular lower body clothing?: A Little 6 Click Score: 23   End of Session Equipment Utilized During Treatment: Gait belt;Rolling walker (2 wheels) Nurse Communication: Mobility status  Activity Tolerance: Patient tolerated treatment well Patient left: in chair;with family/visitor present;with call bell/phone within reach  OT Visit Diagnosis: Other abnormalities of gait and mobility (R26.89);Pain Pain - Right/Left: Left Pain - part of body: Knee                Time: 1610-9604 OT Time Calculation (min): 23 min Charges:  OT General Charges $OT Visit: 1 Visit OT Evaluation $OT Eval Low Complexity: 1 Low OT Treatments $Self Care/Home Management : 8-22 mins  [place], OTR/L   Alexis Goodell 08/19/2022, 11:08 AM

## 2022-08-19 NOTE — Progress Notes (Signed)
Physical Therapy Treatment Patient Details Name: Dakota Gibbs MRN: [id number] DOB: January 31, 1951 Today's Date: 08/19/2022   History of Present Illness 72 yo male presents to [facility] on 4/12 with SOB, fevers, and weakness, s/p LTKR on 4/10. Imaging negative for PE, DVT; no cardiac source for SOB. PMH includes anxiety and depression, LBP s/p sx (2023), CAD, DM II, HDL, HTN, MI, and  OSA.    PT Comments    Pt received sitting in recliner and agreeable to session with wife present. Pt able to tolerate increased gait distance with mild DOE and SpO2 WFL. Pt able to perform stair trial with min A for balance and cues for safety and sequence. Discussed guarding techniques with wife. Education provided on exercises, navigating home set up, and use of knee immobilizer. Pt requesting to don KI prior to ambulation, however pt able to perform SLR without extension lag. Pt able to perform gait trial without KI and no noted L knee buckling or instability. Anticipate pt and wife will be able to manage pt's mobility needs at home once medically ready for discharge.    Recommendations for follow up therapy are one component of a multi-disciplinary discharge planning process, led by the attending physician.  Recommendations may be updated based on patient status, additional functional criteria and insurance authorization.     Assistance Recommended at Discharge Intermittent Supervision/Assistance  Patient can return home with the following A little help with walking and/or transfers;A little help with bathing/dressing/bathroom;Assistance with cooking/housework;Help with stairs or ramp for entrance;Assist for transportation   Equipment Recommendations  None recommended by PT    Recommendations for Other Services       Precautions / Restrictions Precautions Precautions: Fall;Knee Restrictions Weight Bearing Restrictions: No LLE Weight Bearing: Weight bearing as tolerated     Mobility  Bed Mobility                General bed mobility comments: Pt beginning and ending session in recliner    Transfers Overall transfer level: Needs assistance Equipment used: Rolling walker (2 wheels) Transfers: Sit to/from Stand Sit to Stand: Supervision           General transfer comment: from recliner and bench in hallway with increased time for power up and supervision for safety    Ambulation/Gait Ambulation/Gait assistance: Supervision, Min guard Gait Distance (Feet): 150 Feet Assistive device: Rolling walker (2 wheels) Gait Pattern/deviations: Step-through pattern, Decreased stride length, Antalgic, Decreased stance time - left       General Gait Details: Pt demonstrating antalgic pattern with improvement with increased distance. Initially min guard progressing to supervision.   Stairs Stairs: Yes Stairs assistance: Min assist Stair Management: With walker Number of Stairs: 5 General stair comments: Cues for sequence and technique. Min A for balance intermittently RW stabilization. Cues for safety due to pt attempting to turn around on step without RW support and demonstrating unsteadiness.      Balance Overall balance assessment: Modified Independent Sitting-balance support: No upper extremity supported, Feet supported Sitting balance-Leahy Scale: Normal     Standing balance support: During functional activity, Bilateral upper extremity supported Standing balance-Leahy Scale: Fair Standing balance comment: with RW support                            Cognition Arousal/Alertness: Awake/alert Behavior During Therapy: WFL for tasks assessed/performed Overall Cognitive Status: Within Functional Limits for tasks assessed  Exercises      General Comments General comments (skin integrity, edema, etc.): VSS. SpO2 96% during gait trial on RA. Pt reporting mild DOE.      Pertinent Vitals/Pain Pain  Assessment Pain Assessment: Faces Faces Pain Scale: Hurts a little bit Pain Location: L knee Pain Descriptors / Indicators: Operative site guarding, Aching, Sore Pain Intervention(s): Limited activity within patient's tolerance, Monitored during session     PT Goals (current goals can now be found in the care plan section) Acute Rehab PT Goals Patient Stated Goal: to be able to golf again and walk 2 miles without pain PT Goal Formulation: With patient Time For Goal Achievement: 09/01/22 Potential to Achieve Goals: Good Progress towards PT goals: Progressing toward goals    Frequency    Min 4X/week      PT Plan Current plan remains appropriate    AM-PAC PT "6 Clicks" Mobility   Outcome Measure  Help needed turning from your back to your side while in a flat bed without using bedrails?: A Little Help needed moving from lying on your back to sitting on the side of a flat bed without using bedrails?: A Little Help needed moving to and from a bed to a chair (including a wheelchair)?: A Little Help needed standing up from a chair using your arms (e.g., wheelchair or bedside chair)?: A Little Help needed to walk in hospital room?: A Little Help needed climbing 3-5 steps with a railing? : A Little 6 Click Score: 18    End of Session Equipment Utilized During Treatment: Gait belt Activity Tolerance: Patient tolerated treatment well Patient left: in chair;with family/visitor present;with call bell/phone within reach Nurse Communication: Mobility status PT Visit Diagnosis: Difficulty in walking, not elsewhere classified (R26.2);Pain     Time: 1345-1416 PT Time Calculation (min) (ACUTE ONLY): 31 min  Charges:  $Gait Training: 23-37 mins                     Johny Shock, PTA Acute Rehabilitation Services Secure Chat Preferred  [phone]    Johny Shock 08/19/2022, 2:30 PM

## 2022-08-19 NOTE — Discharge Summary (Signed)
Physician Discharge Summary  Dakota Gibbs ZOX:[id number] DOB: [birth year] DOA: 08/15/2022  PCP: Mliss Sax, MD  Admit date: 08/15/2022 Discharge date: 08/19/2022  Time spent: 60 minutes  Recommendations for Outpatient Follow-up:  Follow-up with Dr. Nicki Guadalajara, cardiology 08/27/2022. Follow-up with Dr. Shelle Iron, orthopedics in 1 week. Follow-up with Mliss Sax, MD in 2 to 3 weeks.   Discharge Diagnoses:  Principal Problem:   Fever postop Active Problems:   SOB (shortness of breath)   DM2 (diabetes mellitus, type 2)   Primary hypertension   GERD   Coronary artery disease involving native coronary artery of native heart without angina pectoris   S/P TKR (total knee replacement) using cement, left   Hyponatremia   Sepsis   Hypokalemia   PVC (premature ventricular contraction)   Rash of back   Discharge Condition: Stable and improved.  Diet recommendation: Heart healthy  Filed Weights   08/15/22 1906 08/18/22 0447  Weight: 93.4 kg 97.3 kg    History of present illness:  HPI per Dr. Marylee Floras is a 72 y.o. male with medical history significant of DM2, HTN.   Pt underwent L TKA x2 days ago.   Pt in to ED with c/o generalized weakness, cough, SOB.   Pt hypoxic in triage and needing 2L O2.   Fever today of 101 at home, was 100.4 here in triage.   Pain in L knee with some redness and swelling.   No dysuria, no N/V/D, no sick contacts, no h/o blood clots. Hospital Course:   Assessment and Plan: * Fever postop Patient on presentation had presented with fever of 100.4 + WBC 11k, technically meets SIRS criteria. Unknown source of infection.  Concern for left knee infection on admission. Patient was placed on the sepsis pathway.   CT angiogram chest negative for PE or infiltrate. Lower extremity Dopplers negative for DVT. Urinalysis nitrite negative leukocytes negative. Blood cultures obtained pending with no growth to date x  4 days..   Patient initially empirically placed on IV vancomycin and IV Zosyn and received 3 days of IV antibiotics. Procalcitonin noted at 0.19. IV antibiotics were subsequently discontinued and patient remained afebrile, with a normal white count and remained in stable condition. Patient seen in consultation by orthopedics who assessed patient does not feel patient with an acute infection at this time and recommended outpatient follow-up with orthopedics, Dr. Shelle Iron this week. Pulmonary toileting/incentive spirometry ordered. Outpatient follow-up with orthopedics and PCP.  SOB (shortness of breath) -Questionable etiology. -Likely secondary to fluid resuscitation during recent hospitalization perioperatively. -Patient denied any chest pain. -CT angiogram chest done negative for PE or infiltrate. -Lower extremity Dopplers done negative for DVT. -2D echo with EF of 70 to 75%, grade 1 diastolic dysfunction, hyperdynamic right ventricular systolic function, severely dilated left atrial size, moderately dilated right atrial size. -Patient does state prior to surgery was having some shortness of breath with exertion. -Patient placed on Lasix 20 mg IV every 12 hours on 08/16/2022 with excellent urine output and patient was -4.397 L during this hospitalization.  -Patient seen in consultation by cardiology, patient now euvolemic on exam and IV Lasix transitioned to oral Lasix 40 mg daily per cardiology. -BNP initially elevated at 312 on presentation and trended down to 110. -Per cardiology if shortness of breath continues to occur may need noninvasive ischemic workup in the outpatient setting. -Patient improved clinically during the hospitalization and will be discharged in stable and improved condition. -Outpatient follow-up with cardiology.  Primary hypertension -BP was stable and slightly uptrending.   -Patient was on IV Lasix with good urine output during the hospitalization and was -4.397 L  during this hospitalization.   -Lasix IV dose was changed to oral Lasix 40 mg daily per cardiology recommendations.  -Lopressor dose decreased to 12.5 mg twice daily per cardiology.   -HCTZ and amlodipine discontinued during the hospitalization.  -Patient resumed back on home regimen of Benicar.   -Outpatient follow-up with cardiology and PCP.   DM2 (diabetes mellitus, type 2) -Hemoglobin A1c 6.5 (08/06/2022 ). -Patient's oral hypoglycemic agents were held during the hospitalization and patient maintained on sliding scale insulin.   -Oral hypoglycemic agents will be resumed on discharge.   Rash of back -Patient noted to have developed a rash on his back felt likely a contact dermatitis from bedsheets. -Patient with complaints of pruritus.   -Patient received a dose of IV Pepcid, IV Benadryl, IV Solu-Medrol and will be discharged home on a Pepcid and Benadryl taper.  -Outpatient follow-up with PCP.    PVC (premature ventricular contraction) -Noted on telemetry. -Per cardiology patient wore a monitor in 2020 with less than 1% burden. -Cardiology recommended 2-week Zio patch to assess PVC load which they feel could potentially be the etiology of his shortness of breath. -Beta-blocker dose decreased to 12.5 mg twice daily per cardiology. -Outpatient follow-up with cardiology.  Hypokalemia -Secondary to diuretics. -Repleted. -Patient placed on scheduled potassium supplementation daily while on diuretics. -Patient will get repeat labs done in 1 week with his cardiologist.  Sepsis -Sepsis ruled out  Hyponatremia -Likely secondary to hypervolemic hyponatremia in the setting of HCTZ. -HCTZ was held and will not be resumed on discharge.   -Received IV Lasix which was subsequently transition to oral Lasix with improvement with hyponatremia.  -Outpatient follow-up with PCP.   S/P TKR (total knee replacement) using cement, left -Patient assessed by orthopedics. -PT/OT. -Outpatient follow-up  with orthopedics.  Coronary artery disease involving native coronary artery of native heart without angina pectoris -Stable -Patient maintained on beta-blocker, Lasix, Zetia.   -Outpatient follow-up with cardiology.   GERD -Patient maintained on PPI.         Procedures: CT angiogram chest 08/16/2022 Chest x-ray 08/15/2022 2D echo 08/16/2022 Lower extremity Dopplers 08/16/2022:  Consultations: Orthopedics: Dr. Odis Hollingshead 08/17/2022 Cardiology: Dr. Elberta Fortis 08/17/2022    Discharge Exam: Vitals:   08/19/22 1002 08/19/22 1444  BP:  (!) 122/107  Pulse: 72 65  Resp:  17  Temp:  97.8 F (36.6 C)  SpO2: 96% 95%    General: NAD Cardiovascular: Regular rate and rhythm no murmurs rubs or gallops.  No JVD.  No lower extremity edema. Respiratory: Clear to auscultation bilaterally.  No wheezes, no crackles, no rhonchi.  Fair air movement.  Speaking in full sentences.  Discharge Instructions   Discharge Instructions     Diet - low sodium heart healthy   Complete by: As directed    Increase activity slowly   Complete by: As directed       Allergies as of 08/19/2022   No Known Allergies      Medication List     STOP taking these medications    amLODipine 5 MG tablet Commonly known as: NORVASC   hydrochlorothiazide 12.5 MG capsule Commonly known as: MICROZIDE       TAKE these medications    ascorbic acid 500 MG tablet Commonly known as: VITAMIN C Take 500 mg by mouth daily.   aspirin EC 81 MG tablet  Take 1 tablet (81 mg total) by mouth 2 (two) times daily after a meal. Day after surgery   cetirizine 10 MG tablet Commonly known as: ZYRTEC Take 10 mg by mouth daily.   citalopram 20 MG tablet Commonly known as: CELEXA ( GENERIC FOR CELEXA) TAKE ONE AND ONE-HALF TABLETS BY MOUTH DAILY   cyanocobalamin 1000 MCG tablet Commonly known as: VITAMIN B12 Take 1,000 mcg by mouth daily.   diphenhydrAMINE 25 mg capsule Commonly known as: BENADRYL Take 1 capsule  (25 mg total) by mouth 2 (two) times daily for 3 days, THEN 1 capsule (25 mg total) daily for 3 days. Start taking on: August 19, 2022   docusate sodium 100 MG capsule Commonly known as: Colace Take 1 capsule (100 mg total) by mouth 2 (two) times daily as needed for mild constipation.   ezetimibe 10 MG tablet Commonly known as: ZETIA TAKE ONE TABLET BY MOUTH DAILY What changed: when to take this   famotidine 20 MG tablet Commonly known as: PEPCID Take 1 tablet (20 mg total) by mouth 2 (two) times daily for 3 days, THEN 1 tablet (20 mg total) daily for 3 days. Start taking on: August 19, 2022   fluticasone 50 MCG/ACT nasal spray Commonly known as: FLONASE Place 1 spray into both nostrils daily.   furosemide 40 MG tablet Commonly known as: LASIX Take 1 tablet (40 mg total) by mouth daily. Start taking on: August 20, 2022   HYDROcodone-acetaminophen 10-325 MG tablet Commonly known as: Norco Take 1 tablet by mouth every 4 (four) hours as needed for up to 7 days for severe pain.   hydrocortisone cream 1 % Apply 1 Application topically 2 (two) times daily for 7 days. Apply to back   metFORMIN 500 MG 24 hr tablet Commonly known as: GLUCOPHAGE-XR TAKE ONE TABLET BY MOUTH EVERY DAY WITH BREAKFAST   methocarbamol 500 MG tablet Commonly known as: ROBAXIN Take 1 tablet (500 mg total) by mouth every 8 (eight) hours as needed for muscle spasms.   metoprolol tartrate 25 MG tablet Commonly known as: LOPRESSOR Take 0.5 tablets (12.5 mg total) by mouth 2 (two) times daily. What changed: See the new instructions.   multivitamin tablet Take 1 tablet by mouth daily.   nitroGLYCERIN 0.4 MG SL tablet Commonly known as: NITROSTAT For chest pain, tightness, or pressure. While sitting, place 1 tablet under tongue. May be used every 5 minutes as needed, for up to 15 minutes. Do not use more than 3 tablets.   olmesartan 20 MG tablet Commonly known as: BENICAR Take 1 tablet (20 mg total) by  mouth daily. What changed: when to take this   omeprazole 20 MG tablet Commonly known as: PRILOSEC OTC Take 20 mg by mouth at bedtime.   oxymetazoline 0.05 % nasal spray Commonly known as: AFRIN Place 1 spray into both nostrils at bedtime.   polyethylene glycol 17 g packet Commonly known as: MIRALAX / GLYCOLAX Take 17 g by mouth daily.   potassium chloride SA 20 MEQ tablet Commonly known as: KLOR-CON M Take 2 tablets (40 mEq total) by mouth daily. Start taking on: August 20, 2022   pyridOXINE 100 MG tablet Commonly known as: VITAMIN B6 Take 100 mg by mouth daily.   tadalafil 20 MG tablet Commonly known as: CIALIS May take one or two up to every other day as needed for ed.   trolamine salicylate 10 % cream Commonly known as: ASPERCREME Apply 1 Application topically as needed for muscle pain.  Vitamin D 50 MCG (2000 UT) tablet Take 2,000 Units by mouth daily.       No Known Allergies  Follow-up Information     Lennette Bihari, MD Follow up on 08/27/2022.   Specialty: Cardiology Why: at 10:05 AM with his NP  Arley Phenix information: [address] [place] [address] [phone]         [facility] Follow up on 08/25/2022.   Specialty: Cardiology Why: please have lab work done at Dr. Landry Dyke office next Monday Contact information: [address] [place] [address] [phone]        [facility] Follow up.   Specialty: Home Health Services Why: [facility] agency for PT Contact information: [address] [place] [address] [phone]         Jene Every, MD. Schedule an appointment as soon as possible for a visit.   Specialty: Orthopedic Surgery Why: Next week Contact information: [address] [place] [address] [phone]         Mliss Sax, MD. Schedule an appointment as soon as possible for a visit in 2  week(s).   Specialty: Family Medicine Why: f/u in 2-3 weeks Contact information: [address] [place] [address] [phone]                  The results of significant diagnostics from this hospitalization (including imaging, microbiology, ancillary and laboratory) are listed below for reference.    Significant Diagnostic Studies: VAS [place] LOWER EXTREMITY VENOUS (DVT)  Result Date: 08/17/2022  Lower Venous DVT Study Patient Name:  Dakota Gibbs  Date of Exam:   08/16/2022 Medical Rec #: [id number]            Accession #:    [id number] Date of Birth: [birth year]             Patient Gender: M Patient Age:   75 years Exam Location:  [facility] Procedure:      VAS [place] LOWER EXTREMITY VENOUS (DVT) Referring Phys: Lyda Perone --------------------------------------------------------------------------------  Indications: Swelling, Left TKA 2 days ago, and SOB.  Limitations: Swelling and vessel depth. Comparison Study: No previous study. Performing Technologist: McKayla Maag RVT, [place]  Examination Guidelines: A complete evaluation includes B-mode imaging, spectral Doppler, color Doppler, and power Doppler as needed of all accessible portions of each vessel. Bilateral testing is considered an integral part of a complete examination. Limited examinations for reoccurring indications may be performed as noted. The reflux portion of the exam is performed with the patient in reverse Trendelenburg.  +-----+---------------+---------+-----------+----------+--------------+ RIGHTCompressibilityPhasicitySpontaneityPropertiesThrombus Aging +-----+---------------+---------+-----------+----------+--------------+ CFV  Full           Yes      Yes                                 +-----+---------------+---------+-----------+----------+--------------+ SFJ  Full                                                         +-----+---------------+---------+-----------+----------+--------------+   +---------+---------------+---------+-----------+----------+-------------------+ LEFT     CompressibilityPhasicitySpontaneityPropertiesThrombus Aging      +---------+---------------+---------+-----------+----------+-------------------+ CFV      Full  Yes      Yes                                      +---------+---------------+---------+-----------+----------+-------------------+ SFJ      Full                                                             +---------+---------------+---------+-----------+----------+-------------------+ FV Prox  Full                                                             +---------+---------------+---------+-----------+----------+-------------------+ FV Mid   Full           Yes      Yes                                      +---------+---------------+---------+-----------+----------+-------------------+ FV Distal               Yes      Yes                  Not well visualized +---------+---------------+---------+-----------+----------+-------------------+ PFV      Full                                                             +---------+---------------+---------+-----------+----------+-------------------+ POP      Full                                                             +---------+---------------+---------+-----------+----------+-------------------+ PTV      Full                                                             +---------+---------------+---------+-----------+----------+-------------------+ PERO     Full                                         Not well visualized +---------+---------------+---------+-----------+----------+-------------------+     Summary: RIGHT: - No evidence of common femoral vein obstruction.  LEFT: - There is no evidence of deep vein thrombosis in the lower extremity. However, portions of this  examination were limited- see technologist comments above.  - No cystic structure found in the popliteal fossa.  *See table(s) above for measurements and observations. Electronically signed by Gerarda Fraction  on 08/17/2022 at 8:52:10 AM.    Final    ECHOCARDIOGRAM COMPLETE  Result Date: 08/16/2022    ECHOCARDIOGRAM REPORT   Patient Name:   Dakota Gibbs Date of Exam: 08/16/2022 Medical Rec #:  [id number]           Height:       69.5 in Accession #:    [id number]          Weight:       206.0 lb Date of Birth:  08/24/1950            BSA:          2.102 m Patient Age:    71 years            BP:           135/59 mmHg Patient Gender: M                   HR:           71 bpm. Exam Location:  Inpatient Procedure: 2D Echo, Cardiac Doppler and Color Doppler Indications:    Dyspnea R06.00  History:        Patient has prior history of Echocardiogram examinations, most                 recent 01/10/2020. Cardiomegaly, CAD and Previous Myocardial                 Infarction; Risk Factors:Hypertension, Diabetes, Dyslipidemia                 and Former Smoker.  Sonographer:    Celesta Gentile RCS Referring Phys: [phone] [place] Wainwright [place]  Sonographer Comments: Suboptimal subcostal window. Patient abdomen is very distended and somewhat tight. Difficult to image. IMPRESSIONS  1. Left ventricular ejection fraction, by estimation, is 70 to 75%. The left ventricle has hyperdynamic function. The left ventricle has no regional wall motion abnormalities. Left ventricular diastolic parameters are consistent with Grade I diastolic dysfunction (impaired relaxation).  2. Right ventricular systolic function is hyperdynamic. The right ventricular size is normal. Tricuspid regurgitation signal is inadequate for assessing PA pressure.  3. Left atrial size was severely dilated.  4. Right atrial size was moderately dilated.  5. The mitral valve is grossly normal. No evidence of mitral valve regurgitation. No evidence of mitral stenosis.  6. The aortic  valve is tricuspid. Aortic valve regurgitation is not visualized. No aortic stenosis is present.  7. Aortic dilatation noted. There is borderline dilatation of the aortic root, measuring 39 mm. Comparison(s): No significant change from prior study. FINDINGS  Left Ventricle: Left ventricular ejection fraction, by estimation, is 70 to 75%. The left ventricle has hyperdynamic function. The left ventricle has no regional wall motion abnormalities. Definity contrast agent was given IV to delineate the left ventricular endocardial borders. The left ventricular internal cavity size was normal in size. There is no left ventricular hypertrophy. Left ventricular diastolic parameters are consistent with Grade I diastolic dysfunction (impaired relaxation). Right Ventricle: The right ventricular size is normal. No increase in right ventricular wall thickness. Right ventricular systolic function is hyperdynamic. Tricuspid regurgitation signal is inadequate for assessing PA pressure. Left Atrium: Left atrial size was severely dilated. Right Atrium: Right atrial size was moderately dilated. Pericardium: There is no evidence of pericardial effusion. Mitral Valve: The mitral valve is grossly normal. No evidence of mitral valve regurgitation. No evidence of mitral valve stenosis. Tricuspid Valve: The tricuspid valve is not well  visualized. Tricuspid valve regurgitation is not demonstrated. No evidence of tricuspid stenosis. Aortic Valve: The aortic valve is tricuspid. Aortic valve regurgitation is not visualized. No aortic stenosis is present. Pulmonic Valve: The pulmonic valve was not well visualized. Pulmonic valve regurgitation is not visualized. No evidence of pulmonic stenosis. Aorta: Aortic dilatation noted. There is borderline dilatation of the aortic root, measuring 39 mm. Venous: The inferior vena cava was not well visualized. IAS/Shunts: The interatrial septum was not well visualized.  LEFT VENTRICLE PLAX 2D LVIDd:          5.10 cm   Diastology LVIDs:         3.50 cm   LV e' medial:    8.70 cm/s LV PW:         1.10 cm   LV E/e' medial:  11.5 LV IVS:        1.00 cm   LV e' lateral:   14.00 cm/s LVOT diam:     2.10 cm   LV E/e' lateral: 7.1 LV SV:         106 LV SV Index:   50 LVOT Area:     3.46 cm  RIGHT VENTRICLE RV S prime:     23.50 cm/s TAPSE (M-mode): 3.2 cm LEFT ATRIUM              Index        RIGHT ATRIUM           Index LA diam:        4.40 cm  2.09 cm/m   RA Area:     24.60 cm LA Vol (A2C):   95.2 ml  45.28 ml/m  RA Volume:   81.00 ml  38.53 ml/m LA Vol (A4C):   109.0 ml 51.84 ml/m LA Biplane Vol: 102.0 ml 48.51 ml/m  AORTIC VALVE LVOT Vmax:   148.00 cm/s LVOT Vmean:  95.700 cm/s LVOT VTI:    0.305 m  AORTA Ao Root diam: 3.90 cm MITRAL VALVE MV Area (PHT): 3.05 cm     SHUNTS MV Decel Time: 249 msec     Systemic VTI:  0.30 m MV E velocity: 100.00 cm/s  Systemic Diam: 2.10 cm MV A velocity: 119.00 cm/s MV E/A ratio:  0.84 Vishnu Priya Mallipeddi Electronically signed by Winfield Rast Mallipeddi Signature Date/Time: 08/16/2022/4:56:45 PM    Final    CT Angio Chest PE W and/or Wo Contrast  Result Date: 08/16/2022 CLINICAL DATA:  Suspected pulmonary embolism. EXAM: CT ANGIOGRAPHY CHEST WITH CONTRAST TECHNIQUE: Multidetector CT imaging of the chest was performed using the standard protocol during bolus administration of intravenous contrast. Multiplanar CT image reconstructions and MIPs were obtained to evaluate the vascular anatomy. RADIATION DOSE REDUCTION: This exam was performed according to the departmental dose-optimization program which includes automated exposure control, adjustment of the mA and/or kV according to patient size and/or use of iterative reconstruction technique. CONTRAST:  75mL OMNIPAQUE IOHEXOL 350 MG/ML SOLN COMPARISON:  None Available. FINDINGS: Cardiovascular: There is marked severity calcification of the aortic arch and descending thoracic aorta, without evidence of aortic aneurysm.  Satisfactory opacification of the pulmonary arteries to the segmental level. No evidence of pulmonary embolism. Normal heart size with moderate severity coronary artery calcification. No pericardial effusion. Mediastinum/Nodes: No enlarged mediastinal, hilar, or axillary lymph nodes. Thyroid gland, trachea, and esophagus demonstrate no significant findings. Lungs/Pleura: Mild atelectasis is seen within the bilateral lung bases. There is no evidence of an acute infiltrate, pleural effusion or pneumothorax. Upper Abdomen: No  acute abnormality. Musculoskeletal: No chest wall abnormality. No acute or significant osseous findings. Review of the MIP images confirms the above findings. IMPRESSION: 1. No evidence of pulmonary embolism or other acute intrathoracic process. 2. Moderate severity coronary artery calcification. 3. Aortic atherosclerosis. Aortic Atherosclerosis (ICD10-I70.0). Electronically Signed   By: Aram Candela M.D.   On: 08/16/2022 01:01   DG Chest Port 1 View  Result Date: 08/15/2022 CLINICAL DATA:  Possible sepsis. EXAM: PORTABLE CHEST 1 VIEW COMPARISON:  08/03/2020 FINDINGS: Low volume film. The cardio pericardial silhouette is enlarged. The lungs are clear without focal pneumonia, edema, pneumothorax or pleural effusion. No acute bony abnormality. IMPRESSION: No active disease. Electronically Signed   By: Kennith Center M.D.   On: 08/15/2022 20:04   DG Knee 1-2 Views Left  Result Date: 08/14/2022 CLINICAL DATA:  Status post left total knee replacement. EXAM: LEFT KNEE - 1-2 VIEW COMPARISON:  None Available. FINDINGS: The left femoral and tibial components are well situated. Expected postoperative changes are noted in the soft tissues anteriorly. IMPRESSION: Status post left total knee arthroplasty. Electronically Signed   By: Lupita Raider M.D.   On: 08/14/2022 08:30    Microbiology: Recent Results (from the past 240 hour(s))  Urine Culture     Status: None   Collection Time: 08/15/22   7:41 PM   Specimen: Urine, Clean Catch  Result Value Ref Range Status   Specimen Description URINE, CLEAN CATCH  Final   Special Requests NONE  Final   Culture   Final    NO GROWTH Performed at [facility], [address], [place], [place] [address]    Report Status 08/17/2022 FINAL  Final  Resp panel by RT-PCR (RSV, Flu A&B, Covid)     Status: None   Collection Time: 08/15/22  7:42 PM   Specimen: Nasal Swab  Result Value Ref Range Status   SARS Coronavirus 2 by RT PCR NEGATIVE NEGATIVE Final   Influenza A by PCR NEGATIVE NEGATIVE Final   Influenza B by PCR NEGATIVE NEGATIVE Final    Comment: (NOTE) The Xpert Xpress SARS-CoV-2/FLU/RSV plus assay is intended as an aid in the diagnosis of influenza from Nasopharyngeal swab specimens and should not be used as a sole basis for treatment. Nasal washings and aspirates are unacceptable for Xpert Xpress SARS-CoV-2/FLU/RSV testing.  Fact Sheet for Patients: BloggerCourse.com  Fact Sheet for Healthcare Providers: SeriousBroker.it  This test is not yet approved or cleared by the [place] FDA and has been authorized for detection and/or diagnosis of SARS-CoV-2 by FDA under an Emergency Use Authorization (EUA). This EUA will remain in effect (meaning this test can be used) for the duration of the COVID-19 declaration under Section 564(b)(1) of the Act, 21 U.S.C. section 360bbb-3(b)(1), unless the authorization is terminated or revoked.     Resp Syncytial Virus by PCR NEGATIVE NEGATIVE Final    Comment: (NOTE) Fact Sheet for Patients: BloggerCourse.com  Fact Sheet for Healthcare Providers: SeriousBroker.it  This test is not yet approved or cleared by the [place] FDA and has been authorized for detection and/or diagnosis of SARS-CoV-2 by FDA under an Emergency Use Authorization (EUA). This EUA will remain in effect  (meaning this test can be used) for the duration of the COVID-19 declaration under Section 564(b)(1) of the Act, 21 U.S.C. section 360bbb-3(b)(1), unless the authorization is terminated or revoked.  Performed at [facility], [address] [place], [place] [address]   Blood Culture (routine x 2)  Status: None (Preliminary result)   Collection Time: 08/15/22  8:03 PM   Specimen: BLOOD  Result Value Ref Range Status   Specimen Description BLOOD RIGHT ANTECUBITAL  Final   Special Requests   Final    BOTTLES DRAWN AEROBIC AND ANAEROBIC Blood Culture results may not be optimal due to an excessive volume of blood received in culture bottles   Culture   Final    NO GROWTH 4 DAYS Performed at [facility], [address] [place], [place] [address]    Report Status PENDING  Incomplete  Blood Culture (routine x 2)     Status: None (Preliminary result)   Collection Time: 08/15/22  8:45 PM   Specimen: BLOOD  Result Value Ref Range Status   Specimen Description BLOOD LEFT ANTECUBITAL  Final   Special Requests   Final    BOTTLES DRAWN AEROBIC AND ANAEROBIC Blood Culture adequate volume   Culture   Final    NO GROWTH 4 DAYS Performed at [facility], [address], [place], [place] [address]    Report Status PENDING  Incomplete     Labs: Basic Metabolic Panel: Recent Labs  Lab 08/15/22 2003 08/16/22 1214 08/17/22 0123 08/18/22 0210 08/19/22 0210  NA 126* 128* 128* 133* 133*  K 4.2 3.7 3.3* 3.4* 3.8  CL 91* 93* 96* 96* 97*  CO2 24 24 24 27 28   GLUCOSE 132* 131* 121* 146* 170*  BUN 14 11 8 12 15   CREATININE 0.91 0.84 0.91 1.23 1.15  CALCIUM 8.6* 8.2* 7.9* 8.4* 8.7*  MG  --  1.9  --  2.0 2.1   Liver Function Tests: Recent Labs  Lab 08/15/22 2003 08/17/22 0123  AST 25 24  ALT 16 18  ALKPHOS 54 51  BILITOT 1.2 1.2  PROT 6.2* 5.7*  ALBUMIN 2.9* 2.4*   No results for input(s): "LIPASE", "AMYLASE" in the last 168 hours. No results for input(s):  "AMMONIA" in the last 168 hours. CBC: Recent Labs  Lab 08/15/22 2003 08/16/22 1214 08/17/22 0123 08/18/22 0210 08/19/22 0210  WBC 11.9* 11.0* 9.9 8.6 7.6  NEUTROABS 9.3* 8.7* 7.5 6.0 6.5  HGB 10.9* 10.1* 10.5* 10.5* 11.3*  HCT 31.4* 28.7* 29.6* 30.9* 32.1*  MCV 91.8 92.0 91.6 92.8 90.7  PLT 203 180 212 252 302   Cardiac Enzymes: No results for input(s): "CKTOTAL", "CKMB", "CKMBINDEX", "TROPONINI" in the last 168 hours. BNP: BNP (last 3 results) Recent Labs    08/15/22 2003 08/17/22 0124 08/18/22 0210  BNP 312.3* 265.8* 110.5*    ProBNP (last 3 results) No results for input(s): "PROBNP" in the last 8760 hours.  CBG: Recent Labs  Lab 08/18/22 2010 08/18/22 2354 08/19/22 0405 08/19/22 0754 08/19/22 1136  GLUCAP 243* 180* 156* 150* 147*       Signed:  Ramiro Harvest MD.  [facility] 08/19/2022, 3:00 PM

## 2022-08-20 ENCOUNTER — Telehealth: Payer: Self-pay

## 2022-08-20 LAB — CULTURE, BLOOD (ROUTINE X 2): Special Requests: ADEQUATE

## 2022-08-20 NOTE — Transitions of Care (Post Inpatient/ED Visit) (Signed)
   08/20/2022  Name: Dakota Gibbs MRN: [id number] DOB: 1951/01/28  Today's TOC FU Call Status: Today's TOC FU Call Status:: Unsuccessul Call (1st Attempt) Unsuccessful Call (1st Attempt) Date: 08/20/22  Attempted to reach the patient regarding the most recent Inpatient/ED visit.  Follow Up Plan: Additional outreach attempts will be made to reach the patient to complete the Transitions of Care (Post Inpatient/ED visit) call.   Jodelle Gross, RN, BSN, CCM Care Management Coordinator [place] Phone: [phone]/Fax: [phone]

## 2022-08-20 NOTE — Telephone Encounter (Signed)
Patient was discharged.  Will need to review hospitalization.  He has follow-up appointment scheduled next week

## 2022-08-21 ENCOUNTER — Telehealth: Payer: Self-pay

## 2022-08-21 DIAGNOSIS — I493 Ventricular premature depolarization: Secondary | ICD-10-CM | POA: Diagnosis not present

## 2022-08-21 NOTE — Transitions of Care (Post Inpatient/ED Visit) (Signed)
   08/21/2022  Name: Dakota Gibbs MRN: [id number] DOB: August 20, 1950  Today's TOC FU Call Status: Today's TOC FU Call Status:: Successful TOC FU Call Competed TOC FU Call Complete Date: 08/21/22  Transition Care Management Follow-up Telephone Call Date of Discharge: 08/19/22 Discharge Facility: [facility]) Type of Discharge: Inpatient Admission Primary Inpatient Discharge Diagnosis:: Fever Post LTKA How have you been since you were released from the hospital?: Better Any questions or concerns?: No  Items Reviewed: Did you receive and understand the discharge instructions provided?: Yes Medications obtained and verified?: Yes (Medications Reviewed) Any new allergies since your discharge?: No Dietary orders reviewed?: No Do you have support at home?: Yes People in Home: spouse Name of Support/Comfort Primary Source: [facility] and Equipment/Supplies: Were Home Health Services Ordered?: No Any new equipment or medical supplies ordered?: No  Functional Questionnaire: Do you need assistance with bathing/showering or dressing?: Yes Do you need assistance with meal preparation?: Yes Do you need assistance with eating?: No Do you have difficulty maintaining continence: No Do you need assistance with getting out of bed/getting out of a chair/moving?: Yes Do you have difficulty managing or taking your medications?: No  Follow up appointments reviewed: PCP Follow-up appointment confirmed?: NA Specialist Hospital Follow-up appointment confirmed?: Yes Date of Specialist follow-up appointment?: 08/27/22 Follow-Up Specialty Provider:: Carlyon Shadow, NP Do you need transportation to your follow-up appointment?: No Do you understand care options if your condition(s) worsen?: Yes-patient verbalized understanding  SDOH Interventions Today    Flowsheet Row Most Recent Value  SDOH Interventions   Food Insecurity Interventions Intervention Not Indicated  Housing  Interventions Intervention Not Indicated  Financial Strain Interventions Intervention Not Indicated      Interventions Today    Flowsheet Row Most Recent Value  Chronic Disease   Chronic disease during today's visit Other  [Fever Post LTKA]  General Interventions   General Interventions Discussed/Reviewed General Interventions Discussed  Pharmacy Interventions   Pharmacy Dicussed/Reviewed Pharmacy Topics Discussed       Jodelle Gross, RN, BSN, CCM Care Management Coordinator [facility] Phone: [phone]/Fax: [phone]

## 2022-08-22 DIAGNOSIS — G4733 Obstructive sleep apnea (adult) (pediatric): Secondary | ICD-10-CM | POA: Diagnosis not present

## 2022-08-22 DIAGNOSIS — E119 Type 2 diabetes mellitus without complications: Secondary | ICD-10-CM | POA: Diagnosis not present

## 2022-08-22 DIAGNOSIS — Z87891 Personal history of nicotine dependence: Secondary | ICD-10-CM | POA: Diagnosis not present

## 2022-08-22 DIAGNOSIS — Z96652 Presence of left artificial knee joint: Secondary | ICD-10-CM | POA: Diagnosis not present

## 2022-08-22 DIAGNOSIS — Z7984 Long term (current) use of oral hypoglycemic drugs: Secondary | ICD-10-CM | POA: Diagnosis not present

## 2022-08-22 DIAGNOSIS — F32A Depression, unspecified: Secondary | ICD-10-CM | POA: Diagnosis not present

## 2022-08-22 DIAGNOSIS — F419 Anxiety disorder, unspecified: Secondary | ICD-10-CM | POA: Diagnosis not present

## 2022-08-22 DIAGNOSIS — Z85828 Personal history of other malignant neoplasm of skin: Secondary | ICD-10-CM | POA: Diagnosis not present

## 2022-08-22 DIAGNOSIS — I10 Essential (primary) hypertension: Secondary | ICD-10-CM | POA: Diagnosis not present

## 2022-08-22 DIAGNOSIS — Z79899 Other long term (current) drug therapy: Secondary | ICD-10-CM | POA: Diagnosis not present

## 2022-08-22 DIAGNOSIS — I2510 Atherosclerotic heart disease of native coronary artery without angina pectoris: Secondary | ICD-10-CM | POA: Diagnosis not present

## 2022-08-22 DIAGNOSIS — E785 Hyperlipidemia, unspecified: Secondary | ICD-10-CM | POA: Diagnosis not present

## 2022-08-22 DIAGNOSIS — Z471 Aftercare following joint replacement surgery: Secondary | ICD-10-CM | POA: Diagnosis not present

## 2022-08-22 DIAGNOSIS — Z7982 Long term (current) use of aspirin: Secondary | ICD-10-CM | POA: Diagnosis not present

## 2022-08-22 DIAGNOSIS — I252 Old myocardial infarction: Secondary | ICD-10-CM | POA: Diagnosis not present

## 2022-08-22 DIAGNOSIS — K219 Gastro-esophageal reflux disease without esophagitis: Secondary | ICD-10-CM | POA: Diagnosis not present

## 2022-08-25 ENCOUNTER — Other Ambulatory Visit: Payer: Self-pay

## 2022-08-25 DIAGNOSIS — Z471 Aftercare following joint replacement surgery: Secondary | ICD-10-CM | POA: Diagnosis not present

## 2022-08-25 DIAGNOSIS — E876 Hypokalemia: Secondary | ICD-10-CM

## 2022-08-25 DIAGNOSIS — Z87891 Personal history of nicotine dependence: Secondary | ICD-10-CM | POA: Diagnosis not present

## 2022-08-25 DIAGNOSIS — F32A Depression, unspecified: Secondary | ICD-10-CM | POA: Diagnosis not present

## 2022-08-25 DIAGNOSIS — Z7982 Long term (current) use of aspirin: Secondary | ICD-10-CM | POA: Diagnosis not present

## 2022-08-25 DIAGNOSIS — I10 Essential (primary) hypertension: Secondary | ICD-10-CM | POA: Diagnosis not present

## 2022-08-25 DIAGNOSIS — G4733 Obstructive sleep apnea (adult) (pediatric): Secondary | ICD-10-CM | POA: Diagnosis not present

## 2022-08-25 DIAGNOSIS — E119 Type 2 diabetes mellitus without complications: Secondary | ICD-10-CM | POA: Diagnosis not present

## 2022-08-25 DIAGNOSIS — E785 Hyperlipidemia, unspecified: Secondary | ICD-10-CM | POA: Diagnosis not present

## 2022-08-25 DIAGNOSIS — Z79899 Other long term (current) drug therapy: Secondary | ICD-10-CM | POA: Diagnosis not present

## 2022-08-25 DIAGNOSIS — I2510 Atherosclerotic heart disease of native coronary artery without angina pectoris: Secondary | ICD-10-CM | POA: Diagnosis not present

## 2022-08-25 DIAGNOSIS — Z96652 Presence of left artificial knee joint: Secondary | ICD-10-CM | POA: Diagnosis not present

## 2022-08-25 DIAGNOSIS — K219 Gastro-esophageal reflux disease without esophagitis: Secondary | ICD-10-CM | POA: Diagnosis not present

## 2022-08-25 DIAGNOSIS — F419 Anxiety disorder, unspecified: Secondary | ICD-10-CM | POA: Diagnosis not present

## 2022-08-25 DIAGNOSIS — I252 Old myocardial infarction: Secondary | ICD-10-CM | POA: Diagnosis not present

## 2022-08-25 DIAGNOSIS — Z85828 Personal history of other malignant neoplasm of skin: Secondary | ICD-10-CM | POA: Diagnosis not present

## 2022-08-25 DIAGNOSIS — Z7984 Long term (current) use of oral hypoglycemic drugs: Secondary | ICD-10-CM | POA: Diagnosis not present

## 2022-08-25 NOTE — Progress Notes (Unsigned)
Cardiology Clinic Note   Date: 08/27/2022 ID: Dakota Gibbs, DOB 10/14/1950, MRN [id number]  Primary Cardiologist:  Nicki Guadalajara, MD  Patient Profile    Dakota Gibbs is a 72 y.o. male who presents to the clinic today for hospital follow-up.  Past medical history significant for: CAD. LHC 05/10/1995 (STEMI): RCA 100%.  PLA 50 to 60%.  OM1 75%.  OM2 95%.  [name] 60%.  Diagonal 70%.  PTCA of RCA. Stage intervention January 1997: PCI to OM1 and OM 2. LHC 10/04/1999 (angina): No significant restenosis at prior coronary intervention sites including the RCA as well as OM1 and OM 2.  50% smooth narrowing D1.  50% smooth [name] narrowing.  Ostial OM 320% (unchanged).  Distal RCA with 50% narrowing being smooth at the crux of the origin at the PLA and 40% mid PLA. Nuclear stress test 01/27/2019: There is a medium defect of severe severity present in the basal inferior, mid inferior and apical inferior location consistent with prior inferior MI.  Overall low risk study.  No ischemia. Palpitations/PVCs. 14-day ZIO 11/22/2018: Predominant rhythm was sinus rhythm.  Max HR 110 bpm, min HR 46 bpm (while sleeping).  Occasional PVCs.  1 episode of 4 beats of multifocal V. tach at rate of 225 bpm.  No A-fib.  No significant pauses. Shortness of breath. Echo 08/16/2022: EF 70 to 75%.  Grade I DD.  RV systolic function hyperdynamic.  Severe LAE.  Moderate RAE.  Borderline dilatation of aortic root 39 mm.  No significant changes from prior study September 2021. Hypertension. Hyperlipidemia. Lipid panel 05/30/2021: LDL 47, HDL 60, TG 152, total 137. T2DM. GERD.   History of Present Illness    Dakota Gibbs is a longtime patient of cardiology followed by Dr. Tresa Endo for the above outlined history.  Patient was last seen in the office by Bettina Gavia, PA-C on 02/25/2022 for preop evaluation prior to right shoulder surgery.  He was doing well at that time and no further cardiac testing was required prior to  surgery.    Patient presented to the ED via EMS on 08/15/2022 (2 days after left knee replacement) with complaints of shortness of breath and generalized weakness with an O2 sat of 88% on room air.  Patient was febrile.  He was negative for PE and DVT.  Troponin x 2 negative.  BNP elevated at 312.3.  EKG showed sinus rhythm with PVCs.  Patient was discharged 08/19/2022 to follow-up with PCP, orthopedics, cardiology as an outpatient.  2-week ZIO was ordered to evaluate PVC burden as seen on cardiac monitoring during hospitalization.  Today, patient is accompanied by his wife.  He reports increased fatigue and dizziness with position changes.  The symptoms have been progressing over the last year but are now at its worst.  He reports small amount of exertion will cause dyspnea followed by diaphoresis and fatigue.  He is orthopneic with the symptoms.  He states the symptoms are exactly like symptoms he had with MI in 1997.  Patient cannot walk 20 feet from bed to bathroom and back without breaking out into a sweat and feeling short of breath.  Attending PT and is tolerating it well doing mostly seated exercises with some sit to stands.  He does not experience the same amount of dyspnea that he does if he is simply walking or getting dressed.  He was started on Lasix 40 mg daily with K-Dur 40 mEq.  He reports brisk diuresis with Lasix stating "I  think it is too much.  I can barely take anything and before it is coming out."  Labs drawn 2 days ago showed hyperkalemia with potassium 5.8.  He was instructed to stop K-Dur, which he did 1 day ago.  He denies palpitations, feeling as though his heart is out of rhythm or muscle weakness. Patient is still wearing Zio monitor.     ROS: All other systems reviewed and are otherwise negative except as noted in History of Present Illness.  Studies Reviewed    ECG personally reviewed by me today: Sinus bradycardia, 56 bpm.  No significant changes from 08/15/2022.  No ectopy  today.       Physical Exam    VS:  BP (!) 110/58 (BP Location: Left Arm, Patient Position: Sitting, Cuff Size: Small)   Pulse 65   Ht 5' 9.5" (1.765 m)   Wt 201 lb (91.2 kg)   SpO2 96%   BMI 29.26 kg/m  , BMI Body mass index is 29.26 kg/m.  Orthostatic VS for the past 24 hrs (Last 3 readings):  BP- Lying Pulse- Lying BP- Sitting Pulse- Sitting BP- Standing at 0 minutes Pulse- Standing at 0 minutes  08/27/22 1108 132/54 58 124/58 123 90/48 79    GEN: Well nourished, well developed, in no acute distress. Neck: No JVD or carotid bruits. Cardiac:  RRR. No murmurs. No rubs or gallops.   Respiratory:  Respirations regular and unlabored. Clear to auscultation without rales, wheezing or rhonchi. GI: Soft, nontender, nondistended. Extremities: Radials/DP/PT 2+ and equal bilaterally. No clubbing or cyanosis. No edema.  Skin: Warm and dry, no rash. Neuro: Strength intact.  Assessment & Plan     CAD/shortness of breath/fatigue.  S/p PCI January 1997.  LHC June 2001 showed no significant restenosis of prior coronary intervention sites.  Nuclear stress testing September 2020 was a low risk study. Echo April 2024 showed hyperdynamic LV/RV function, no significant changes from prior study September 2021.  Patient reports increased fatigue, dyspnea, diaphoresis and orthopnea with a small amount of exertion.  Simply walking 20 feet to his bathroom and back will cause him to break out in a sweat, becomes dyspneic, and need to lay down and rest.  He reports same exact symptoms with MI 1997.  Given anginal equivalent we will get nuclear stress test for further evaluation.  Continue aspirin, Zetia, metoprolol as needed SL NTG. Hyperkalemia.  Labs drawn 08/25/2022 showed potassium 5.8.  Patient was instructed to stop K-Dur.  Patient denies palpitations, feeling as though his heart is out of rhythm, or muscle weakness.  EKG shows sinus bradycardia, 56 bpm.  Will give 1 dose of Lokelma (sample provided).   Patient will return on Monday, 09/01/2022 for repeat lab.  Patient to continue daily Lasix (see #3). Orthostatic hypotension.  Patient complains of dizziness with position changes.  Patient with significant drop in BP from sitting (124/58) to standing (90/48).  Will decrease Lasix to 20 mg daily for now.  I will discuss symptoms with patient early next week to see if they have improved with decreased dose of Lasix. Palpitations/PVCs.  14-day ZIO July 2020 showed occasional PVCs.  PVCs seen on monitor during hospitalization April 2024.  2-week ZIO ordered.  Patient is still wearing ZIO monitor.  He understands to [facility] at the end of 2 weeks. Hypertension.  BP today 110/58.  Patient denies headaches.  He has experiencing orthostatic hypotension (see #3).  Continue metoprolol, olmesartan. Hyperlipidemia.  LDL January 2023 47, at goal.  Continue Zetia.   Disposition: Nuclear stress test. Decrease lasix 20 mg daily. Lokelma X 1 dose (sample provided). 09/01/2022 for repeat BMP. Return in 3-4 weeks or sooner as needed.      Shared Decision Making/Informed Consent The risks [chest pain, shortness of breath, cardiac arrhythmias, dizziness, blood pressure fluctuations, myocardial infarction, stroke/transient ischemic attack, nausea, vomiting, allergic reaction, radiation exposure, metallic taste sensation and life-threatening complications (estimated to be 1 in 10,000)], benefits (risk stratification, diagnosing coronary artery disease, treatment guidance) and alternatives of a nuclear stress test were discussed in detail with Dakota Gibbs and he agrees to proceed.   Signed, Etta Grandchild. Anniah Glick, DNP, NP-C

## 2022-08-26 ENCOUNTER — Telehealth: Payer: Self-pay

## 2022-08-26 DIAGNOSIS — Z79899 Other long term (current) drug therapy: Secondary | ICD-10-CM | POA: Diagnosis not present

## 2022-08-26 DIAGNOSIS — Z7984 Long term (current) use of oral hypoglycemic drugs: Secondary | ICD-10-CM | POA: Diagnosis not present

## 2022-08-26 DIAGNOSIS — E875 Hyperkalemia: Secondary | ICD-10-CM

## 2022-08-26 DIAGNOSIS — I10 Essential (primary) hypertension: Secondary | ICD-10-CM | POA: Diagnosis not present

## 2022-08-26 DIAGNOSIS — Z471 Aftercare following joint replacement surgery: Secondary | ICD-10-CM | POA: Diagnosis not present

## 2022-08-26 DIAGNOSIS — I2510 Atherosclerotic heart disease of native coronary artery without angina pectoris: Secondary | ICD-10-CM | POA: Diagnosis not present

## 2022-08-26 DIAGNOSIS — Z87891 Personal history of nicotine dependence: Secondary | ICD-10-CM | POA: Diagnosis not present

## 2022-08-26 DIAGNOSIS — F32A Depression, unspecified: Secondary | ICD-10-CM | POA: Diagnosis not present

## 2022-08-26 DIAGNOSIS — Z96652 Presence of left artificial knee joint: Secondary | ICD-10-CM | POA: Diagnosis not present

## 2022-08-26 DIAGNOSIS — K219 Gastro-esophageal reflux disease without esophagitis: Secondary | ICD-10-CM | POA: Diagnosis not present

## 2022-08-26 DIAGNOSIS — E785 Hyperlipidemia, unspecified: Secondary | ICD-10-CM | POA: Diagnosis not present

## 2022-08-26 DIAGNOSIS — Z85828 Personal history of other malignant neoplasm of skin: Secondary | ICD-10-CM | POA: Diagnosis not present

## 2022-08-26 DIAGNOSIS — I252 Old myocardial infarction: Secondary | ICD-10-CM | POA: Diagnosis not present

## 2022-08-26 DIAGNOSIS — G4733 Obstructive sleep apnea (adult) (pediatric): Secondary | ICD-10-CM | POA: Diagnosis not present

## 2022-08-26 DIAGNOSIS — E119 Type 2 diabetes mellitus without complications: Secondary | ICD-10-CM | POA: Diagnosis not present

## 2022-08-26 DIAGNOSIS — Z7982 Long term (current) use of aspirin: Secondary | ICD-10-CM | POA: Diagnosis not present

## 2022-08-26 DIAGNOSIS — F419 Anxiety disorder, unspecified: Secondary | ICD-10-CM | POA: Diagnosis not present

## 2022-08-26 LAB — BASIC METABOLIC PANEL
BUN/Creatinine Ratio: 13 (ref 10–24)
BUN: 14 mg/dL (ref 8–27)
CO2: 22 mmol/L (ref 20–29)
Calcium: 9.9 mg/dL (ref 8.6–10.2)
Chloride: 99 mmol/L (ref 96–106)
Creatinine, Ser: 1.1 mg/dL (ref 0.76–1.27)
Glucose: 144 mg/dL — ABNORMAL HIGH (ref 70–99)
Potassium: 5.8 mmol/L (ref 3.5–5.2)
Sodium: 137 mmol/L (ref 134–144)
eGFR: 71 mL/min/{1.73_m2} (ref >59)

## 2022-08-26 NOTE — Telephone Encounter (Signed)
Noted, thanks!   Will send to MD.

## 2022-08-26 NOTE — Telephone Encounter (Signed)
Spoke to patient received a call from [facility] elevated potassium 5.8.Spoke to DOD Dr.Hilty he advised to stop Kdur.Repeat bmet in 1 week.Order placed.

## 2022-08-27 ENCOUNTER — Telehealth (HOSPITAL_COMMUNITY): Payer: Self-pay | Admitting: *Deleted

## 2022-08-27 ENCOUNTER — Ambulatory Visit: Payer: Medicare Other | Attending: Student | Admitting: Student

## 2022-08-27 ENCOUNTER — Encounter: Payer: Self-pay | Admitting: Student

## 2022-08-27 VITALS — BP 110/58 | HR 65 | Ht 69.5 in | Wt 201.0 lb

## 2022-08-27 DIAGNOSIS — E785 Hyperlipidemia, unspecified: Secondary | ICD-10-CM

## 2022-08-27 DIAGNOSIS — R5383 Other fatigue: Secondary | ICD-10-CM | POA: Diagnosis not present

## 2022-08-27 DIAGNOSIS — I25118 Atherosclerotic heart disease of native coronary artery with other forms of angina pectoris: Secondary | ICD-10-CM | POA: Diagnosis not present

## 2022-08-27 DIAGNOSIS — R0602 Shortness of breath: Secondary | ICD-10-CM

## 2022-08-27 DIAGNOSIS — Z96652 Presence of left artificial knee joint: Secondary | ICD-10-CM | POA: Diagnosis not present

## 2022-08-27 DIAGNOSIS — R002 Palpitations: Secondary | ICD-10-CM

## 2022-08-27 DIAGNOSIS — I951 Orthostatic hypotension: Secondary | ICD-10-CM

## 2022-08-27 DIAGNOSIS — I493 Ventricular premature depolarization: Secondary | ICD-10-CM

## 2022-08-27 DIAGNOSIS — I10 Essential (primary) hypertension: Secondary | ICD-10-CM

## 2022-08-27 DIAGNOSIS — E875 Hyperkalemia: Secondary | ICD-10-CM | POA: Diagnosis not present

## 2022-08-27 MED ORDER — FUROSEMIDE 20 MG PO TABS: 20.0000 mg | ORAL_TABLET | Freq: Every day | ORAL | 3 refills | Status: DC

## 2022-08-27 MED ORDER — LOKELMA 10 G PO PACK: 10.0000 g | PACK | Freq: Once | ORAL | 0 refills | Status: AC

## 2022-08-27 NOTE — Telephone Encounter (Signed)
Patient given detailed instructions per Myocardial Perfusion Study Information Sheet for the test on 09/03/22 Patient notified to arrive 15 minutes early and that it is imperative to arrive on time for appointment to keep from having the test rescheduled.  If you need to cancel or reschedule your appointment, please call the office within 24 hours of your appointment. . Patient verbalized understanding.Dakota Gibbs

## 2022-08-27 NOTE — Patient Instructions (Signed)
Medication Instructions:  Your physician has recommended you make the following change in your medication:  DECREASE: Lasix  daily.  ONE TIME DOSAGE: Lokelma 10g (1 packet). Please take this medication today.  *If you need a refill on your cardiac medications before your next appointment, please call your pharmacy*   Lab Work: Your physician recommends that you return MONDAY to have the following lab drawn: BMET  If you have labs (blood work) drawn today and your tests are completely normal, you will receive your results only by: MyChart Message (if you have MyChart) OR A paper copy in the mail If you have any lab test that is abnormal or we need to change your treatment, we will call you to review the results.   Testing/Procedures: Your physician has requested that you have a lexiscan myoview. For further information please visit [email or web address] Please follow instruction sheet, as given.    Follow-Up: At [facility], you and your health needs are our priority.  As part of our continuing mission to provide you with exceptional heart care, we have created designated Provider Care Teams.  These Care Teams include your primary Cardiologist (physician) and Advanced Practice Providers (APPs -  Physician Assistants and Nurse Practitioners) who all work together to provide you with the care you need, when you need it.  We recommend signing up for the patient portal called "MyChart".  Sign up information is provided on this After Visit Summary.  MyChart is used to connect with patients for Virtual Visits (Telemedicine).  Patients are able to view lab/test results, encounter notes, upcoming appointments, etc.  Non-urgent messages can be sent to your provider as well.   To learn more about what you can do with MyChart, go to [email or web address].    Your next appointment:   3-4 week(s) Make sure his appointment is after his testing has been done.   Provider:   Carlos Levering, NP   Other Instructions How to Prepare for Your Myoview Test (stress test):  Please do not take these medications before your test: Metoprolol (please note if this is an exercise test pt should hold beta blocker prior) Your remaining medications may be taken with water. Nothing to eat or drink, except water, 4 hours prior to arrival time.  NO caffeine/decaffeinated products, or chocolate 12 hours prior to arrival. [place], please do not wear dresses.  Skirts or pants are approprate, please wear a short sleeve shirt. NO perfume, cologne or lotion Wear comfortable walking shoes.  NO HEELS! Total time is 3 to 4 hours; you may want to bring reading material for the waiting time. Please report to [facility] for your test  What to expect after you arrive:  Once you arrive and check in for your appointment an IV will be started in your arm.  Then the Technoligist will inject a small amount of radioactive tracer.  There will be a 1 hour waiting period after this injection.  A series of pictures will be taken of your heart following this waiting period.  You will be prepped for the stress portion of the test.  During the stress portion of your test you will either walk on a treadmill or receive a small, safe amount of radioactive tracer injected in your IV.  After the stress portion, there is a short rest period during which time your heart and blood pressure will be monitored.  After the short rest period the Technologist will begin your second set of pictures.  Your doctor  will inform you of your test results within 7-10 business days.  In preparation for your appointment, medication and supplies will be purchased.  Appointment availability is limited, so if you need to cancel or reschedule please call the office at [phone] 24 hours in advance to avoid a cancellation fee of $100.00  IF YOU THINK YOU MAY BE PREGNANT, PLEASE INFORM THE TECHNOLOGIST.

## 2022-08-28 DIAGNOSIS — I2510 Atherosclerotic heart disease of native coronary artery without angina pectoris: Secondary | ICD-10-CM | POA: Diagnosis not present

## 2022-08-28 DIAGNOSIS — I10 Essential (primary) hypertension: Secondary | ICD-10-CM | POA: Diagnosis not present

## 2022-08-28 DIAGNOSIS — E119 Type 2 diabetes mellitus without complications: Secondary | ICD-10-CM | POA: Diagnosis not present

## 2022-08-28 DIAGNOSIS — Z85828 Personal history of other malignant neoplasm of skin: Secondary | ICD-10-CM | POA: Diagnosis not present

## 2022-08-28 DIAGNOSIS — Z96652 Presence of left artificial knee joint: Secondary | ICD-10-CM | POA: Diagnosis not present

## 2022-08-28 DIAGNOSIS — E785 Hyperlipidemia, unspecified: Secondary | ICD-10-CM | POA: Diagnosis not present

## 2022-08-28 DIAGNOSIS — K219 Gastro-esophageal reflux disease without esophagitis: Secondary | ICD-10-CM | POA: Diagnosis not present

## 2022-08-28 DIAGNOSIS — Z7984 Long term (current) use of oral hypoglycemic drugs: Secondary | ICD-10-CM | POA: Diagnosis not present

## 2022-08-28 DIAGNOSIS — Z87891 Personal history of nicotine dependence: Secondary | ICD-10-CM | POA: Diagnosis not present

## 2022-08-28 DIAGNOSIS — Z79899 Other long term (current) drug therapy: Secondary | ICD-10-CM | POA: Diagnosis not present

## 2022-08-28 DIAGNOSIS — G4733 Obstructive sleep apnea (adult) (pediatric): Secondary | ICD-10-CM | POA: Diagnosis not present

## 2022-08-28 DIAGNOSIS — Z471 Aftercare following joint replacement surgery: Secondary | ICD-10-CM | POA: Diagnosis not present

## 2022-08-28 DIAGNOSIS — F419 Anxiety disorder, unspecified: Secondary | ICD-10-CM | POA: Diagnosis not present

## 2022-08-28 DIAGNOSIS — Z7982 Long term (current) use of aspirin: Secondary | ICD-10-CM | POA: Diagnosis not present

## 2022-08-28 DIAGNOSIS — F32A Depression, unspecified: Secondary | ICD-10-CM | POA: Diagnosis not present

## 2022-08-28 DIAGNOSIS — I252 Old myocardial infarction: Secondary | ICD-10-CM | POA: Diagnosis not present

## 2022-08-29 DIAGNOSIS — Z471 Aftercare following joint replacement surgery: Secondary | ICD-10-CM | POA: Diagnosis not present

## 2022-08-29 DIAGNOSIS — K219 Gastro-esophageal reflux disease without esophagitis: Secondary | ICD-10-CM | POA: Diagnosis not present

## 2022-08-29 DIAGNOSIS — I10 Essential (primary) hypertension: Secondary | ICD-10-CM | POA: Diagnosis not present

## 2022-08-29 DIAGNOSIS — F32A Depression, unspecified: Secondary | ICD-10-CM | POA: Diagnosis not present

## 2022-08-29 DIAGNOSIS — Z79899 Other long term (current) drug therapy: Secondary | ICD-10-CM | POA: Diagnosis not present

## 2022-08-29 DIAGNOSIS — Z96652 Presence of left artificial knee joint: Secondary | ICD-10-CM | POA: Diagnosis not present

## 2022-08-29 DIAGNOSIS — Z7984 Long term (current) use of oral hypoglycemic drugs: Secondary | ICD-10-CM | POA: Diagnosis not present

## 2022-08-29 DIAGNOSIS — E785 Hyperlipidemia, unspecified: Secondary | ICD-10-CM | POA: Diagnosis not present

## 2022-08-29 DIAGNOSIS — G4733 Obstructive sleep apnea (adult) (pediatric): Secondary | ICD-10-CM | POA: Diagnosis not present

## 2022-08-29 DIAGNOSIS — I2510 Atherosclerotic heart disease of native coronary artery without angina pectoris: Secondary | ICD-10-CM | POA: Diagnosis not present

## 2022-08-29 DIAGNOSIS — Z85828 Personal history of other malignant neoplasm of skin: Secondary | ICD-10-CM | POA: Diagnosis not present

## 2022-08-29 DIAGNOSIS — F419 Anxiety disorder, unspecified: Secondary | ICD-10-CM | POA: Diagnosis not present

## 2022-08-29 DIAGNOSIS — I252 Old myocardial infarction: Secondary | ICD-10-CM | POA: Diagnosis not present

## 2022-08-29 DIAGNOSIS — E119 Type 2 diabetes mellitus without complications: Secondary | ICD-10-CM | POA: Diagnosis not present

## 2022-08-29 DIAGNOSIS — Z7982 Long term (current) use of aspirin: Secondary | ICD-10-CM | POA: Diagnosis not present

## 2022-08-29 DIAGNOSIS — Z87891 Personal history of nicotine dependence: Secondary | ICD-10-CM | POA: Diagnosis not present

## 2022-09-01 DIAGNOSIS — E875 Hyperkalemia: Secondary | ICD-10-CM | POA: Diagnosis not present

## 2022-09-02 LAB — BASIC METABOLIC PANEL
BUN/Creatinine Ratio: 13 (ref 10–24)
BUN: 14 mg/dL (ref 8–27)
CO2: 21 mmol/L (ref 20–29)
Calcium: 9.4 mg/dL (ref 8.6–10.2)
Chloride: 99 mmol/L (ref 96–106)
Creatinine, Ser: 1.04 mg/dL (ref 0.76–1.27)
Glucose: 128 mg/dL — ABNORMAL HIGH (ref 70–99)
Potassium: 5.3 mmol/L — ABNORMAL HIGH (ref 3.5–5.2)
Sodium: 137 mmol/L (ref 134–144)
eGFR: 76 mL/min/{1.73_m2} (ref >59)

## 2022-09-03 ENCOUNTER — Ambulatory Visit (HOSPITAL_COMMUNITY): Payer: Medicare Other | Attending: Cardiovascular Disease

## 2022-09-03 DIAGNOSIS — I25118 Atherosclerotic heart disease of native coronary artery with other forms of angina pectoris: Secondary | ICD-10-CM

## 2022-09-03 LAB — MYOCARDIAL PERFUSION IMAGING
LV dias vol: 83 mL (ref 62–150)
LV sys vol: 43 mL
Nuc Stress EF: 48 %
Peak HR: 86 {beats}/min
Rest HR: 56 {beats}/min
Rest Nuclear Isotope Dose: 10.9 mCi
SDS: 3
SRS: 3
SSS: 6
ST Depression (mm): 0 mm
Stress Nuclear Isotope Dose: 32.2 mCi
TID: 0.81

## 2022-09-03 MED ORDER — TECHNETIUM TC 99M TETROFOSMIN IV KIT
32.2000 | PACK | Freq: Once | INTRAVENOUS | Status: AC | PRN
  Administered 2022-09-03: 32.2 via INTRAVENOUS

## 2022-09-03 MED ORDER — TECHNETIUM TC 99M TETROFOSMIN IV KIT
10.9000 | PACK | Freq: Once | INTRAVENOUS | Status: AC | PRN
  Administered 2022-09-03: 10.9 via INTRAVENOUS

## 2022-09-03 MED ORDER — REGADENOSON 0.4 MG/5ML IV SOLN
0.4000 mg | Freq: Once | INTRAVENOUS | Status: AC
Start: 2022-09-03 — End: 2022-09-03
  Administered 2022-09-03: 0.4 mg via INTRAVENOUS

## 2022-09-05 DIAGNOSIS — I493 Ventricular premature depolarization: Secondary | ICD-10-CM | POA: Diagnosis not present

## 2022-09-05 DIAGNOSIS — I10 Essential (primary) hypertension: Secondary | ICD-10-CM

## 2022-09-05 MED ORDER — OLMESARTAN MEDOXOMIL 40 MG PO TABS
40.0000 mg | ORAL_TABLET | Freq: Every day | ORAL | 1 refills | Status: DC
Start: 2022-09-05 — End: 2023-07-07

## 2022-09-05 NOTE — Telephone Encounter (Signed)
Spoke with pt he states that since his appt with Gavin Pound his "spells" have been getting worse from "sometimes to multiple times daiily.  he is having dizziness and feeling like he is going to passout.and his BP/HR has been over the place within minutes and with minimal exertion/rest. BP/HR has been ranging with activity/rest 193/95, 188'ish, 150/92, 125/78( hr 145 after 30 rest), 152/80 and so on.  Will discuss with DOD and see what is next. Discussed all this the BP/HR with DR Croitoru he states that pt has had PVC/palpitations in the past so have pt take 3 BP/HR in a row and average out these for a more accurate BP/HR. Ok to increase Olmesartan 40mg  and then see how it goes. Document these averages and call back if it does not get any better. If symptoms increase /worsen go to the ER. Pt does have an appointment 5-15 with DW.  Pt informed of providers result & recommendations. Pt verbalized understanding. All questions, if any, were answered. He will keep a log of these numbers for review. He will let us know if symptoms persist/worsen. He will send a mychart message on Monday with update on these BP/HR, symptoms etc. Pt "has plenty of medication" so I sent new rx to his mail-in pharmacy to be filled.  Will await update

## 2022-09-08 NOTE — Telephone Encounter (Signed)
Chest pain comes and goes.  Normally has none.  Taken nitroglycerin 3x in two years. . This pain has gone away "so fast I didn't have the need it.  Has had it twice in 5 days, lasting a minute or so.  Pain is dead center of chest,  Does get SOB, and dizzy with lightheadedness  when getting up and doing anything. Checks BP when feeling this and sees that it is low,  90/58 this morning. These symptoms are same as last visit but the pain in center chest is new.  Advised if pain continues and not resolving as has in the past or has symptoms of nausea and vomiting, sweating, SOB with the  chest pain he should go to the ED. Patient states understanding  Olmesartan 40 mg at bedtime was just increased over the last few days Metoprolol Tartrate 25 mg  1/2 tab Twice a day Lasix 20 mg Daily  Reduced at last visit   Heart monitor completed as of last Tuesday  Appt. Made for sooner by two days to be seen

## 2022-09-10 ENCOUNTER — Other Ambulatory Visit: Payer: Self-pay

## 2022-09-10 ENCOUNTER — Ambulatory Visit: Payer: Medicare Other | Attending: Orthopedic Surgery

## 2022-09-10 DIAGNOSIS — Z9889 Other specified postprocedural states: Secondary | ICD-10-CM | POA: Insufficient documentation

## 2022-09-10 DIAGNOSIS — R29898 Other symptoms and signs involving the musculoskeletal system: Secondary | ICD-10-CM | POA: Diagnosis not present

## 2022-09-10 DIAGNOSIS — M25562 Pain in left knee: Secondary | ICD-10-CM | POA: Insufficient documentation

## 2022-09-10 DIAGNOSIS — R262 Difficulty in walking, not elsewhere classified: Secondary | ICD-10-CM | POA: Diagnosis not present

## 2022-09-10 DIAGNOSIS — G8929 Other chronic pain: Secondary | ICD-10-CM | POA: Diagnosis not present

## 2022-09-10 DIAGNOSIS — Z96659 Presence of unspecified artificial knee joint: Secondary | ICD-10-CM

## 2022-09-10 DIAGNOSIS — M25662 Stiffness of left knee, not elsewhere classified: Secondary | ICD-10-CM | POA: Insufficient documentation

## 2022-09-10 NOTE — Therapy (Signed)
OUTPATIENT PHYSICAL THERAPY LOWER EXTREMITY EVALUATION   Patient Name: Dakota Gibbs MRN: [id number] DOB:[birth year], 72 y.o., male Today's Date: 09/10/2022  END OF SESSION:  PT End of Session - 09/10/22 1312     Visit Number 1    Date for PT Re-Evaluation 11/05/22    PT Start Time 1015    PT Stop Time 1100    PT Time Calculation (min) 45 min    Activity Tolerance Patient tolerated treatment well;Other (comment)   limited due to cardiac sx, hypotension   Behavior During Therapy [facility] for tasks assessed/performed             Past Medical History:  Diagnosis Date   Allergy    enviornmental   Anxiety    Anxiety and depression 10/25/2012   Arthritis    self dx   Back pain    L4-L5 bulging disc, L5-S1 - bulging disc, pinched nerve in neck   Cataract    bilateral sx   Coronary artery disease    hx of balloon angioplasty with DR Bishop Limbo   Depression    Diabetes mellitus    pt denies   History of shingles 2009   Hyperlipidemia    on meds   Hypertension    on meds   Myocardial infarction (HCC) 1997   caused by a blood clot   Sleep apnea    cpap   Past Surgical History:  Procedure Laterality Date   ANGIOPLASTY  1997   BACK SURGERY  04/02/2022   basel cell     skin carcinoma removed x 4   BILATERAL CARPAL TUNNEL RELEASE     CARDIAC CATHETERIZATION     10/04/1999   COLONOSCOPY  2018   SA-MAC-suprep(good)-TA   DOPPLER ECHOCARDIOGRAPHY  07/09/2006   EF 50 to 55 %, LA mildy dilated   HERNIA REPAIR  1951   RIGHT   KNEE ARTHROSCOPY Right 2001   R   NM MYOCAR PERF WALL MOTION  08/22/2011   Mets 13,low risk study   POLYPECTOMY  2018   TA   TOTAL KNEE ARTHROPLASTY Left 08/13/2022   Procedure: TOTAL KNEE ARTHROPLASTY;  Surgeon: Jene Every, MD;  Location: [facility];  Service: Orthopedics;  Laterality: Left;   WRIST SURGERY  1984   right   Patient Active Problem List   Diagnosis Date Noted   PVC (premature ventricular contraction) 08/18/2022   Rash of back  08/18/2022   Sepsis (HCC) 08/17/2022   Hypokalemia 08/17/2022   Fever postop 08/16/2022   Hyponatremia 08/16/2022   SOB (shortness of breath) 08/16/2022   S/P TKR (total knee replacement) using cement, left 08/13/2022   Cardiomegaly 06/23/2022   Hepatic steatosis 06/23/2022   Abdominal bloating 06/02/2022   Leg pain 02/18/2022   Injury of right shoulder 02/17/2022   Neuropathy associated with endocrine disorder (HCC) 02/17/2022   Need for influenza vaccination 02/17/2022   Prediabetes 12/03/2021   Bilateral carpal tunnel syndrome 10/01/2021   Abnormal urinalysis 06/10/2021   Receptive aphasia 05/30/2021   Memory loss 05/30/2021   Cough due to ACE inhibitor 08/27/2020   Cough 08/03/2020   Lower abdominal pain 08/03/2020   Benign prostatic hyperplasia with post-void dribbling 08/03/2020   Paresthesias 08/03/2020   Anemia 08/03/2020   Alcohol use 07/22/2019   Primary hypertension 07/22/2019   Basal cell carcinoma (BCC) in situ of skin 05/14/2017   Sinus bradycardia 03/15/2015   Coronary artery disease involving native coronary artery of native heart without angina pectoris 03/15/2015  PCP NOTES >>> 02/21/2015   Hyperlipidemia with target LDL less than 70 06/08/2013   Palpitations 06/08/2013   DJD (degenerative joint disease) 03/16/2013   Healthcare maintenance 03/16/2013   Anxiety and depression 10/25/2012   Lipoma 03/20/2011   DM2 (diabetes mellitus, type 2) (HCC) 07/10/2009   ERECTILE DYSFUNCTION 02/21/2008   CORONARY ARTERY DISEASE 02/21/2008   GERD 02/21/2008    PCP: Vonna Kotyk,   REFERRING PROVIDER: Jene Every, MD  REFERRING DIAG: s/p L TKA  THERAPY DIAG:  History of revision of total knee arthroplasty  Difficulty in walking, not elsewhere classified  Chronic pain of left knee  Weakness of both legs  Rationale for Evaluation and Treatment: Rehabilitation  ONSET DATE: 08/13/22  SUBJECTIVE:   SUBJECTIVE STATEMENT: I had the knee surgery, but  unfortunately I have developed some new cardiac Sx.  To see cardiologist at beginning of next week.  My blood pressure fluctuates between 190's systolic and 90's diastolic, then will drop to 90 /40.  I have several episodes a day in which my heart races and I feel short of breath, and I almost pass out.  Happened in the lobby again today .  Have worn a monitor and hope to have some medication changes and /or clarity about my heart on Monday.  Also twisted my L knee in bed a couple of nights ago so it is really sore/tender med and lateral knee.  PERTINENT HISTORY: Underwent L TKA 08/13/22, prior to that time underwent  LS surgery due to L lumbar radiculopathy PAIN:  Are you having pain? Yes: NPRS scale: 4/10 Pain location: L knee Pain description: consistent Aggravating factors: twisted in bed, any twisting motion L knee Relieving factors: meds, rest  PRECAUTIONS: Other: new cardiac/postural hypotensive issues since this surgery  WEIGHT BEARING RESTRICTIONS: No  FALLS:  Has patient fallen in last 6 months? Yes. Number of falls 1  LIVING ENVIRONMENT: Lives with: lives with their spouse Lives in: House/apartment Stairs: Yes: Internal: 11 steps; on right going up Has following equipment at home: Single point cane, r walker  OCCUPATION: desk job, on computer  PLOF: Independent  PATIENT GOALS: return to pain free walking, able to travel  NEXT MD VISIT: unclear  OBJECTIVE:   DIAGNOSTIC FINDINGS: na  PATIENT SURVEYS:  LEFS 21/80, 26%  COGNITION: Overall cognitive status: Within functional limits for tasks assessed     SENSATION: WFL  EDEMA: mild L ant knee   POSTURE:  habitually flexed L knee  PALPATION: Point tender lat tibia attachments of TFL L Normal patellar mobility  LOWER EXTREMITY ROM:  Active ROM Right eval Left eval  Hip flexion    Hip extension    Hip abduction    Hip adduction    Hip internal rotation    Hip external rotation    Knee flexion  103   Knee extension  -13  Ankle dorsiflexion    Ankle plantarflexion    Ankle inversion    Ankle eversion     All testing wfl unless otherwise noted  LOWER EXTREMITY MMT:  MMT Right eval Left eval  Hip flexion  4  Hip extension    Hip abduction    Hip adduction    Hip internal rotation    Hip external rotation    Knee flexion  4-  Knee extension  4-  Ankle dorsiflexion    Ankle plantarflexion  4/5  Ankle inversion    Ankle eversion       FUNCTIONAL TESTS: not performed  today due to fluctuating hypotension, including episode in lobby today   GAIT: Distance walked: 42' in clinic Assistive device utilized: Single point cane Level of assistance: Modified independence Comments: L knee flexed , decreased stance time L and decreased step length R   TODAY'S TREATMENT:                                                                                                                              DATE: 09/10/22  Therapeutic exercise:  Instructed patient in the following exercises to add at home: see HEP described at home.  Also inst in seated L knee  post capsule stretch with L foot on stool, ankle below knee and knee below hip.  Did not utilized any upright activities or bicycle, Nustep due to cardiac Sx. Also kinesiotape, 2 I Pieces extending from tibial tubercle to mid thigh, surrounding patella, to provide patient with more stability, support for sleep, rolling in bed  PATIENT EDUCATION:  Education details: POC, GOALS, expectations for outcomes Person educated: Patient Education method: Medical illustrator Education comprehension: verbalized understanding, returned demonstration, and verbal cues required  HOME EXERCISE PROGRAM: Access Code: [email or web address] URL: [email or web address] Date: 09/10/2022 Prepared by: Katharin Schneider  Exercises - Supine Quad Set on Towel Roll  - 1 x daily - 7 x weekly - 3 sets - 10 reps - Supine Calf Stretch with Strap  - 1 x daily - 7 x  weekly - 3 sets - 10 reps - Long Sitting Ankle Plantar Flexion with Resistance  - 1 x daily - 7 x weekly - 3 sets - 10 reps  ASSESSMENT:  CLINICAL IMPRESSION: Patient is a 72 y.o. male who was seen today for physical therapy evaluation and treatment for s/p L TKA.  His evaluation was limited to seated and supine activities due to his new onset of cardiac sx and risk for syncope.  He is limited with flexibility L knee as expected, more pronounced with extension, which is affecting his gait efficiency.  He also demonstrates limited endurance.  Has good quad contraction, was preparing with strengthening LE's prior to this surgery.  Will benefit from skilled PT to address his Sx.  Will see his cardiologist prior to his next appt here and hopefully will have more clarity regarding how to safely exercise.   OBJECTIVE IMPAIRMENTS: cardiopulmonary status limiting activity, decreased activity tolerance, decreased mobility, difficulty walking, decreased ROM, decreased strength, increased edema, impaired flexibility, and pain.   ACTIVITY LIMITATIONS: carrying, lifting, bending, standing, squatting, sleeping, stairs, transfers, bed mobility, and locomotion level  PARTICIPATION LIMITATIONS: cleaning, laundry, shopping, community activity, occupation, and yard work  PERSONAL FACTORS: Age, Past/current experiences, Time since onset of injury/illness/exacerbation, and 1-2 comorbidities: coronary heard disease, recent L lumbar fusion  are also affecting patient's functional outcome.   REHAB POTENTIAL: Good  CLINICAL DECISION MAKING: Stable/uncomplicated  EVALUATION COMPLEXITY: Low   GOALS: Goals reviewed with patient? Yes  SHORT TERM GOALS: Target date: 2 weeks  09/24/22  I HEP Baseline:established at eval Goal status: INITIAL   LONG TERM GOALS: Target date: 11/05/22  Improve L knee flexibility to -5 extension to 115 flexion Baseline: -13 to 102 Goal status: INITIAL  2.  FGA 28/30 Baseline: not  assessed initially due to cardiac Sx Goal status: INITIAL  3.  LEFS improve from 21/80 to 40/80 Baseline: 21/80 Goal status: INITIAL  4.  Strength L quads, hamstrings, hip all planes 5/5 for normal transfers, gait over even and uneven terrain Baseline: 4-/5 Goal status: INITIAL    PLAN:  PT FREQUENCY: 2x/week  PT DURATION: 8 weeks  PLANNED INTERVENTIONS: Therapeutic exercises, Therapeutic activity, Neuromuscular re-education, Balance training, Gait training, Patient/Family education, Self Care, and Joint mobilization  PLAN FOR NEXT SESSION: get clear guideline on activity level as patient will have gone to cardiologist, initiate strengthening and gym activities for L knee, manual techniques, stretch L post knee   Yitty Roads L Jettie Lazare, PT 09/10/2022, 4:28 PM

## 2022-09-11 NOTE — Progress Notes (Signed)
Cardiology Clinic Note   Date: 09/15/2022 ID: Dakota Gibbs, DOB [birth year], MRN [id number]  Primary Cardiologist:  Dakota Guadalajara, MD  Patient Profile    Dakota Gibbs is a 72 y.o. male who presents to the clinic today for follow-up.   Past medical history significant for: CAD. LHC 05/10/1995 (STEMI): RCA 100%.  PLA 50 to 60%.  OM1 75%.  OM2 95%.  [name] 60%.  Diagonal 70%.  PTCA of RCA. Stage intervention January 1997: PCI to OM1 and OM 2. LHC 10/04/1999 (angina): No significant restenosis at prior coronary intervention sites including the RCA as well as OM1 and OM 2.  50% smooth narrowing D1.  50% smooth [name] narrowing.  Ostial OM 320% (unchanged).  Distal RCA with 50% narrowing being smooth at the crux of the origin at the PLA and 40% mid PLA. Nuclear stress test 09/03/2022: Low risk study with old mid-- basal inferior wall infarction, otherwise normal perfusion with no evidence of reversible ischemia.  Mildly reduced LV function due to inferior wall hypokinesis.  Unchanged from prior study 01/27/2019. Palpitations/PVCs. 14-day ZIO 09/09/2022: Preliminary report showed min HR 42 bpm, max HR 164 bpm, average HR 59 bpm.  Predominant underlying rhythm was sinus.  9 SVT runs, fastest 8 beats with max rate 164 bpm, longest 11.8 seconds average rate 113 bpm.  Junctional rhythm detected within +/- 45 seconds of triggered events.  Rare ectopy. Shortness of breath. Echo 08/16/2022: EF 70 to 75%.  Grade I DD.  RV systolic function hyperdynamic.  Severe LAE.  Moderate RAE.  Borderline dilatation of aortic root 39 mm.  No significant changes from prior study September 2021. Hypertension. Hyperlipidemia. Lipid panel 05/30/2021: LDL 47, HDL 60, TG 152, total 137. T2DM. GERD.   History of Present Illness    Dakota Gibbs is a longtime patient of cardiology followed by Dakota. Tresa Gibbs for the above outlined history.   Patient was last seen in the office by me on 08/27/2022 for hospital follow-up.   Patient was seen in the ED on 08/15/2018 for 2 days after left knee replacement with complaints of shortness of breath and generalized weakness with an O2 sat of 88% on room air.  He was febrile.  Negative for PE and DVT.  Troponin negative x 2.  BNP elevated at 312.3.  EKG showed sinus rhythm with PVCs.  2-week ZIO was ordered to evaluate PVC burden.  When seen in the office patient continued to complain of increased fatigue and positional dizziness.  He had actually been experiencing the symptoms for a year but were reported at their worst at time of visit.  Patient reported dyspnea with a small amount of exertion followed by diaphoresis and fatigue.  Also reports orthopnea.  Symptoms felt to be anginal equivalent.  He was started on Lasix 40 mg but felt it was too much.  He was orthostatic and Lasix dose was decreased to 20 mg daily.  He was also hyperkalemic with potassium at 5.8 and had been instructed to stop K-Dur.  He denied palpitations, feeling of arrhythmia, or muscle weakness.  Was given a sample of Lokelma to help reduce potassium.  Repeat BMP showed potassium of 5.3.  Patient underwent nuclear stress test secondary to anginal symptoms which showed low risk study unchanged from prior study September 2020.  Patient contacted the office on 09/05/2022 with complaints of increased dizziness and presyncope: " Spoke with pt he states that since his appt with Dakota Gibbs his "spells" have been getting worse  from "sometimes to multiple times daiily.  he is having dizziness and feeling like he is going to passout.and his BP/HR has been over the place within minutes and with minimal exertion/rest. BP/HR has been ranging with activity/rest 193/95, 188'ish, 150/92, 125/78( hr 145 after 30 rest), 152/80 and so on.  Will discuss with DOD and see what is next. Discussed all this the BP/HR with Dakota Gibbs he states that pt has had PVC/palpitations in the past so have pt take 3 BP/HR in a row and average out these for a  more accurate BP/HR. Ok to increase Olmesartan 40mg  and then see how it goes. Document these averages and call back if it does not get any better. If symptoms increase /worsen go to the ER. Pt does have an appointment 5-15 with DW.  Pt informed of providers result & recommendations. Pt verbalized understanding. All questions, if any, were answered. He will keep a log of these numbers for review. He will let us know if symptoms persist/worsen. He will send a mychart message on Monday with update on these BP/HR, symptoms etc."  Contacted office on 09/08/2022: Per patient "When not stressed my BP is 165/85. When I get up, 80%of the time, my BP drops to 90/50. Then I am lightheaded, dizzy, short of breath, fatigue , and recently periodic chest pain, twice in last 3 days. I am very limited, pretty much, chair or bedridden. I feel that my condition is more serious than I am being treated for. I have to wait 10 more days in limbo until my next appointment."  Triage nurse spoke with patient:  Chest pain comes and goes.  Normally has none.  Taken nitroglycerin 3x in two years. This pain has gone away "so fast I didn't have the need it.  Has had it twice in 5 days, lasting a minute or so.  Pain is dead center of chest,  Does get SOB, and dizzy with lightheadedness  when getting up and doing anything. Checks BP when feeling this and sees that it is low,  90/58 this morning. These symptoms are same as last visit but the pain in center chest is new.  Advised if pain continues and not resolving as has in the past or has symptoms of nausea and vomiting, sweating, SOB with the  chest pain he should go to the ED. Patient states understanding. Moved office visit up two days sooner.   Today, patient is accompanied by his wife.  He stopped Lasix per my instructions over the weekend and has not had much improvement. He reports some mild edema in left leg (operative side). Weight was up 1 lb. He was not previously on Lasix prior to  ED visit in April.  He continues to have episodes of lightheadedness and dizziness getting up from sit to stand or after he starts walking for short period of time.  He is able to participate in physical therapy and is tolerating that well.  He denies continued episodes of chest pain.  He is very concerned over increased BP in the mornings.  He has been getting readings 170-180/85-95.  He is unsure if he ever increased his olmesartan.    ROS: All other systems reviewed and are otherwise negative except as noted in History of Present Illness.  Studies Reviewed    ECG personally reviewed by me today: Sinus bradycardia, 52 bpm.  No significant changes from 08/27/2022.       Physical Exam    VS:  Ht 5'  9" (1.753 m)   Wt 199 lb (90.3 kg)   SpO2 98%   BMI 29.39 kg/m  , BMI Body mass index is 29.39 kg/m.  Orthostatic VS for the past 24 hrs (Last 3 readings):  BP- Lying Pulse- Lying BP- Sitting Pulse- Sitting BP- Standing at 0 minutes Pulse- Standing at 0 minutes BP- Standing at 3 minutes Pulse- Standing at 3 minutes  09/15/22 0920 161/69 54 146/64 58 129/72 60 129/72 60  09/15/22 0911 161/69 97 146/64 58 129/72 60 129/72 60     GEN: Well nourished, well developed, in no acute distress. Neck: No JVD or carotid bruits. Cardiac:  RRR. No murmurs. No rubs or gallops.   Respiratory:  Respirations regular and unlabored. Clear to auscultation without rales, wheezing or rhonchi. GI: Soft, nontender, nondistended. Extremities: Radials/DP/PT 2+ and equal bilaterally. No clubbing or cyanosis.  Mild edema left lower extremity. Skin: Warm and dry, no rash. Neuro: Strength intact.  Assessment & Plan    Orthostasis.  Patient complains of lightheadedness dizziness with position changes and after walking a short distance.  He stopped Lasix over the weekend but does not feel it has improved his symptoms much.  He is orthostatic but not hypotensive today with complaints of mild dizziness from sit to  stand and then again after standing for 3 minutes.  Discussed case with Dakota. Jacques Navy.  Will stop metoprolol for 1 to 2 weeks to see if symptoms improve.  If no change he may restart it.  Instructed patient to wear compression socks and abdominal binder.  He will also decrease tramadol. CAD.  S/p PCI January 1997.  LHC June 2001 showed no significant restenosis of prior coronary intervention sites.  Nuclear stress testing May 2024 low risk study unchanged from September 2020.  Patient denies further episodes of chest pain. He is participating physical therapy and reports he is tolerating it well. Will trial stopping metoprolol (see #1). Continue aspirin, Zetia. Palpitations.  14-day ZIO May 2024 showed rare ectopy, 9 SVT runs, junctional rhythm +/- 45 seconds of triggered events.  Patient reports some episodes of fast heart rate.  He is typically bradycardic without symptoms.  I do not feel current episodes of lightheadedness/dizziness are associated with his heart rate as much as a drop in his BP.  He is going to trial going off metoprolol (see #1). Hypertension.  BP today 146/66 while lying down.  Orthostatic vitals performed today (see physical exam).  Final BP after standing for 3 minutes 129/72.  Patient reports morning BP has been running 170-180/85-95 and has him very concerned.  He denies headaches in the morning but does get afternoon headaches for the past couple weeks that resolved with Tylenol.  He is uncertain if he ever increased olmesartan.  He will double check that today and get started with the increased dose if he has not already.  He will trial stopping metoprolol (see #1).  Disposition: Stop metoprolol for 1 to 2 weeks to see if symptoms improve.  May restart if symptoms are unchanged.  Do not restart Lasix; continue to weigh daily.  Compression socks and abdominal binder.  Return in 2 months with me and in September with Dakota. Tresa Gibbs.         Signed, Etta Grandchild. Reggie Welge, DNP, NP-C

## 2022-09-12 ENCOUNTER — Telehealth: Payer: Self-pay | Admitting: Cardiovascular Disease

## 2022-09-12 ENCOUNTER — Telehealth: Payer: Self-pay | Admitting: Student

## 2022-09-12 NOTE — Telephone Encounter (Signed)
Spoke with patient via telephone. He continues to have orthostasis and DOE. He has been weighing daily and it has been decreasing/stable. Patient will stop lasix over the weekend. Will keep office visit for Monday.   Etta Grandchild. Olivine Hiers, DNP, NP-C  09/12/2022, 4:51 PM [facility] [address] Office [phone] Fax [phone]

## 2022-09-12 NOTE — Telephone Encounter (Signed)
Patient is calling for sleep study results. Have you reviewed them yet? Looking in the chart the results does not seemed to be finalized.

## 2022-09-12 NOTE — Telephone Encounter (Signed)
Patient stated he wants a call back regarding his heart monitor results.

## 2022-09-15 ENCOUNTER — Ambulatory Visit: Payer: Medicare Other | Attending: Student | Admitting: Student

## 2022-09-15 ENCOUNTER — Encounter: Payer: Self-pay | Admitting: Student

## 2022-09-15 VITALS — Ht 69.0 in | Wt 199.0 lb

## 2022-09-15 DIAGNOSIS — I951 Orthostatic hypotension: Secondary | ICD-10-CM | POA: Diagnosis not present

## 2022-09-15 DIAGNOSIS — I2510 Atherosclerotic heart disease of native coronary artery without angina pectoris: Secondary | ICD-10-CM | POA: Diagnosis not present

## 2022-09-15 DIAGNOSIS — R002 Palpitations: Secondary | ICD-10-CM | POA: Diagnosis not present

## 2022-09-15 DIAGNOSIS — I10 Essential (primary) hypertension: Secondary | ICD-10-CM | POA: Diagnosis not present

## 2022-09-15 NOTE — Patient Instructions (Addendum)
Medication Instructions:  Your physician has recommended you make the following change in your medication:  TRIAL STOP: Metoprolol 1-2 weeks to see if your symptoms get better. (Will not remove medication off of your medication list)  *If you need a refill on your cardiac medications before your next appointment, please call your pharmacy*  Please purchase an abdominal binder and some compression stockings to wear.    Lab Work: NONE If you have labs (blood work) drawn today and your tests are completely normal, you will receive your results only by: MyChart Message (if you have MyChart) OR A paper copy in the mail If you have any lab test that is abnormal or we need to change your treatment, we will call you to review the results.   Testing/Procedures: NONE   Follow-Up: At [facility], you and your health needs are our priority.  As part of our continuing mission to provide you with exceptional heart care, we have created designated Provider Care Teams.  These Care Teams include your primary Cardiologist (physician) and Advanced Practice Providers (APPs -  Physician Assistants and Nurse Practitioners) who all work together to provide you with the care you need, when you need it.  We recommend signing up for the patient portal called "MyChart".  Sign up information is provided on this After Visit Summary.  MyChart is used to connect with patients for Virtual Visits (Telemedicine).  Patients are able to view lab/test results, encounter notes, upcoming appointments, etc.  Non-urgent messages can be sent to your provider as well.   To learn more about what you can do with MyChart, go to [email or web address].    Your next appointment:   Both of your follow-up appointments has been scheduled.  2 Months f/u with Carlos Levering, NP 4 Month f/u with Nicki Guadalajara MD

## 2022-09-16 NOTE — Telephone Encounter (Signed)
Pt informed that awaiting Dr. Elberta Fortis to review monitor results. Aware will call back this week w/ his interpretation. Patient verbalized understanding and agreeable to plan.

## 2022-09-17 ENCOUNTER — Other Ambulatory Visit: Payer: Self-pay

## 2022-09-17 ENCOUNTER — Ambulatory Visit: Payer: Medicare Other

## 2022-09-17 ENCOUNTER — Ambulatory Visit: Payer: Medicare Other | Admitting: Student

## 2022-09-17 DIAGNOSIS — G8929 Other chronic pain: Secondary | ICD-10-CM | POA: Diagnosis not present

## 2022-09-17 DIAGNOSIS — M25662 Stiffness of left knee, not elsewhere classified: Secondary | ICD-10-CM | POA: Diagnosis not present

## 2022-09-17 DIAGNOSIS — R262 Difficulty in walking, not elsewhere classified: Secondary | ICD-10-CM | POA: Diagnosis not present

## 2022-09-17 DIAGNOSIS — R29898 Other symptoms and signs involving the musculoskeletal system: Secondary | ICD-10-CM

## 2022-09-17 DIAGNOSIS — Z96659 Presence of unspecified artificial knee joint: Secondary | ICD-10-CM | POA: Diagnosis not present

## 2022-09-17 DIAGNOSIS — Z9889 Other specified postprocedural states: Secondary | ICD-10-CM | POA: Diagnosis not present

## 2022-09-17 DIAGNOSIS — M25562 Pain in left knee: Secondary | ICD-10-CM | POA: Diagnosis not present

## 2022-09-17 NOTE — Therapy (Signed)
OUTPATIENT PHYSICAL THERAPY LOWER EXTREMITY EVALUATION   Patient Name: Dakota Gibbs MRN: [id number] DOB:11/25/1950, 72 y.o., male Today's Date: 09/17/2022  END OF SESSION:  PT End of Session - 09/17/22 0929     Visit Number 2    Date for PT Re-Evaluation 11/05/22    PT Start Time 0804    PT Stop Time 0845    PT Time Calculation (min) 41 min    Activity Tolerance Patient tolerated treatment well;Other (comment)    Behavior During Therapy WFL for tasks assessed/performed              Past Medical History:  Diagnosis Date   Allergy    enviornmental   Anxiety    Anxiety and depression 10/25/2012   Arthritis    self dx   Back pain    L4-L5 bulging disc, L5-S1 - bulging disc, pinched nerve in neck   Cataract    bilateral sx   Coronary artery disease    hx of balloon angioplasty with DR Bishop Limbo   Depression    Diabetes mellitus    pt denies   History of shingles 2009   Hyperlipidemia    on meds   Hypertension    on meds   Myocardial infarction (HCC) 1997   caused by a blood clot   Sleep apnea    cpap   Past Surgical History:  Procedure Laterality Date   ANGIOPLASTY  1997   BACK SURGERY  04/02/2022   basel cell     skin carcinoma removed x 4   BILATERAL CARPAL TUNNEL RELEASE     CARDIAC CATHETERIZATION     10/04/1999   COLONOSCOPY  2018   SA-MAC-suprep(good)-TA   DOPPLER ECHOCARDIOGRAPHY  07/09/2006   EF 50 to 55 %, LA mildy dilated   HERNIA REPAIR  1951   RIGHT   KNEE ARTHROSCOPY Right 2001   R   NM MYOCAR PERF WALL MOTION  08/22/2011   Mets 13,low risk study   POLYPECTOMY  2018   TA   TOTAL KNEE ARTHROPLASTY Left 08/13/2022   Procedure: TOTAL KNEE ARTHROPLASTY;  Surgeon: Jene Every, MD;  Location: [facility];  Service: Orthopedics;  Laterality: Left;   WRIST SURGERY  1984   right   Patient Active Problem List   Diagnosis Date Noted   PVC (premature ventricular contraction) 08/18/2022   Rash of back 08/18/2022   Sepsis (HCC) 08/17/2022    Hypokalemia 08/17/2022   Fever postop 08/16/2022   Hyponatremia 08/16/2022   SOB (shortness of breath) 08/16/2022   S/P TKR (total knee replacement) using cement, left 08/13/2022   Cardiomegaly 06/23/2022   Hepatic steatosis 06/23/2022   Abdominal bloating 06/02/2022   Leg pain 02/18/2022   Injury of right shoulder 02/17/2022   Neuropathy associated with endocrine disorder (HCC) 02/17/2022   Need for influenza vaccination 02/17/2022   Prediabetes 12/03/2021   Bilateral carpal tunnel syndrome 10/01/2021   Abnormal urinalysis 06/10/2021   Receptive aphasia 05/30/2021   Memory loss 05/30/2021   Cough due to ACE inhibitor 08/27/2020   Cough 08/03/2020   Lower abdominal pain 08/03/2020   Benign prostatic hyperplasia with post-void dribbling 08/03/2020   Paresthesias 08/03/2020   Anemia 08/03/2020   Alcohol use 07/22/2019   Primary hypertension 07/22/2019   Basal cell carcinoma (BCC) in situ of skin 05/14/2017   Sinus bradycardia 03/15/2015   Coronary artery disease involving native coronary artery of native heart without angina pectoris 03/15/2015   PCP NOTES >>> 02/21/2015  Hyperlipidemia with target LDL less than 70 06/08/2013   Palpitations 06/08/2013   DJD (degenerative joint disease) 03/16/2013   Healthcare maintenance 03/16/2013   Anxiety and depression 10/25/2012   Lipoma 03/20/2011   DM2 (diabetes mellitus, type 2) (HCC) 07/10/2009   ERECTILE DYSFUNCTION 02/21/2008   CORONARY ARTERY DISEASE 02/21/2008   GERD 02/21/2008    PCP: Vonna Kotyk,   REFERRING PROVIDER: Jene Every, MD  REFERRING DIAG: s/p L TKA  THERAPY DIAG:  History of revision of total knee arthroplasty  Difficulty in walking, not elsewhere classified  Chronic pain of left knee  Weakness of both legs  Rationale for Evaluation and Treatment: Rehabilitation  ONSET DATE: 08/13/22  SUBJECTIVE:   SUBJECTIVE STATEMENT: 09/17/22: Since I saw the cardiologist and they made all of the  medication changes, I feel much better, able to remain upright most of yesterday without problems.    Eval: I had the knee surgery, but unfortunately I have developed some new cardiac Sx.  To see cardiologist at beginning of next week.  My blood pressure fluctuates between 190's systolic and 90's diastolic, then will drop to 90 /40.  I have several episodes a day in which my heart races and I feel short of breath, and I almost pass out.  Happened in the lobby again today .  Have worn a monitor and hope to have some medication changes and /or clarity about my heart on Monday.  Also twisted my L knee in bed a couple of nights ago so it is really sore/tender med and lateral knee.  PERTINENT HISTORY: Underwent L TKA 08/13/22, prior to that time underwent  LS surgery due to L lumbar radiculopathy PAIN:  Are you having pain? Yes: NPRS scale: 4/10 Pain location: L knee Pain description: consistent Aggravating factors: twisted in bed, any twisting motion L knee Relieving factors: meds, rest  PRECAUTIONS: Other: new cardiac/postural hypotensive issues since this surgery  WEIGHT BEARING RESTRICTIONS: No  FALLS:  Has patient fallen in last 6 months? Yes. Number of falls 1  LIVING ENVIRONMENT: Lives with: lives with their spouse Lives in: House/apartment Stairs: Yes: Internal: 11 steps; on right going up Has following equipment at home: Single point cane, r walker  OCCUPATION: desk job, on computer  PLOF: Independent  PATIENT GOALS: return to pain free walking, able to travel  NEXT MD VISIT: unclear  OBJECTIVE:   DIAGNOSTIC FINDINGS: na  PATIENT SURVEYS:  LEFS 21/80, 26%  COGNITION: Overall cognitive status: Within functional limits for tasks assessed     SENSATION: WFL  EDEMA: mild L ant knee   POSTURE:  habitually flexed L knee  PALPATION: Point tender lat tibia attachments of TFL L Normal patellar mobility  LOWER EXTREMITY ROM:  Active ROM Right eval Left eval  Hip  flexion    Hip extension    Hip abduction    Hip adduction    Hip internal rotation    Hip external rotation    Knee flexion  103  Knee extension  -13  Ankle dorsiflexion    Ankle plantarflexion    Ankle inversion    Ankle eversion     All testing wfl unless otherwise noted  LOWER EXTREMITY MMT:  MMT Right eval Left eval  Hip flexion  4  Hip extension    Hip abduction    Hip adduction    Hip internal rotation    Hip external rotation    Knee flexion  4-  Knee extension  4-  Ankle dorsiflexion  Ankle plantarflexion  4/5  Ankle inversion    Ankle eversion       FUNCTIONAL TESTS: not performed today due to fluctuating hypotension, including episode in lobby today   GAIT: Distance walked: 78' in clinic Assistive device utilized: Single point cane Level of assistance: Modified independence Comments: L knee flexed , decreased stance time L and decreased step length R   TODAY'S TREATMENT:                                                                                                                              DATE: 09/17/22 : Therapeutic exercise: instructed in the following to engage B LE strength, flexibility L knee. Again emphasized achieving L knee extension flexibility and strength  Nustep level 3 Ue's and LE's 6 min  L knee flexion sitting : 110, L knee extension supine with overpressure: -10 Supine L hamstring stretch, therapist assisted , applying overpressure L post knee capsule Prone for cross friction massage L hamstring Prone TKE's 5 sec holds, 10 reps Standing forefeet on metal bar, B heel raises and calf stretch, mass practice Kinesiotaping L ant knee distal attachment L tibial tubercle, proximal attachment L quads, surrounding patella for tracking support, proprioception.     09/10/22  Therapeutic exercise:  Instructed patient in the following exercises to add at home: see HEP described at home.  Also inst in seated L knee  post capsule stretch with L  foot on stool, ankle below knee and knee below hip.  Did not utilized any upright activities or bicycle, Nustep due to cardiac Sx. Also kinesiotape, 2 I Pieces extending from tibial tubercle to mid thigh, surrounding patella, to provide patient with more stability, support for sleep, rolling in bed  PATIENT EDUCATION:  Education details: POC, GOALS, expectations for outcomes Person educated: Patient Education method: Medical illustrator Education comprehension: verbalized understanding, returned demonstration, and verbal cues required  HOME EXERCISE PROGRAM: Access Code: [email or web address] URL: [email or web address] Date: 09/10/2022 Prepared by: Ragan Duhon  Exercises - Supine Quad Set on Towel Roll  - 1 x daily - 7 x weekly - 3 sets - 10 reps - Supine Calf Stretch with Strap  - 1 x daily - 7 x weekly - 3 sets - 10 reps - Long Sitting Ankle Plantar Flexion with Resistance  - 1 x daily - 7 x weekly - 3 sets - 10 reps  ASSESSMENT:  CLINICAL IMPRESSION: Patient is a 72 y.o. male who was seen today for physical therapy  treatment for s/p L TKA.  He has had many of his medications adjusted by the cardiologist, since his PT evaluation and therefore we were able to  perform more aggressive stretching and upright activities today.  His largest issue still is L posterior capsular tightness.  Did not add any additional therex today.   Will benefit from skilled PT to address his function and recovery.    OBJECTIVE IMPAIRMENTS: cardiopulmonary status limiting activity, decreased activity tolerance,  decreased mobility, difficulty walking, decreased ROM, decreased strength, increased edema, impaired flexibility, and pain.   ACTIVITY LIMITATIONS: carrying, lifting, bending, standing, squatting, sleeping, stairs, transfers, bed mobility, and locomotion level  PARTICIPATION LIMITATIONS: cleaning, laundry, shopping, community activity, occupation, and yard work  PERSONAL FACTORS: Age,  Past/current experiences, Time since onset of injury/illness/exacerbation, and 1-2 comorbidities: coronary heard disease, recent L lumbar fusion  are also affecting patient's functional outcome.   REHAB POTENTIAL: Good  CLINICAL DECISION MAKING: Stable/uncomplicated  EVALUATION COMPLEXITY: Low   GOALS: Goals reviewed with patient? Yes  SHORT TERM GOALS: Target date: 2 weeks 09/24/22  I HEP Baseline:established at eval Goal status: IN PROGRESS   LONG TERM GOALS: Target date: 11/05/22  Improve L knee flexibility to -5 extension to 115 flexion Baseline: -13 to 102 Goal status: INITIAL  2.  FGA 28/30 Baseline: not assessed initially due to cardiac Sx Goal status: INITIAL  3.  LEFS improve from 21/80 to 40/80 Baseline: 21/80 Goal status: INITIAL  4.  Strength L quads, hamstrings, hip all planes 5/5 for normal transfers, gait over even and uneven terrain Baseline: 4-/5 Goal status: INITIAL    PLAN:  PT FREQUENCY: 2x/week  PT DURATION: 8 weeks  PLANNED INTERVENTIONS: Therapeutic exercises, Therapeutic activity, Neuromuscular re-education, Balance training, Gait training, Patient/Family education, Self Care, and Joint mobilization  PLAN FOR NEXT SESSION: strengthening and gym activities for L knee, manual techniques, stretch L post knee   Styles Fambro L Joash Tony, PT 09/17/2022, 11:03 AM

## 2022-09-19 ENCOUNTER — Ambulatory Visit: Payer: Medicare Other | Admitting: Physical Therapy

## 2022-09-19 DIAGNOSIS — G8929 Other chronic pain: Secondary | ICD-10-CM | POA: Diagnosis not present

## 2022-09-19 DIAGNOSIS — R29898 Other symptoms and signs involving the musculoskeletal system: Secondary | ICD-10-CM

## 2022-09-19 DIAGNOSIS — Z9889 Other specified postprocedural states: Secondary | ICD-10-CM | POA: Diagnosis not present

## 2022-09-19 DIAGNOSIS — M25562 Pain in left knee: Secondary | ICD-10-CM | POA: Diagnosis not present

## 2022-09-19 DIAGNOSIS — Z96659 Presence of unspecified artificial knee joint: Secondary | ICD-10-CM | POA: Diagnosis not present

## 2022-09-19 DIAGNOSIS — R262 Difficulty in walking, not elsewhere classified: Secondary | ICD-10-CM | POA: Diagnosis not present

## 2022-09-19 DIAGNOSIS — M25662 Stiffness of left knee, not elsewhere classified: Secondary | ICD-10-CM | POA: Diagnosis not present

## 2022-09-19 NOTE — Therapy (Signed)
OUTPATIENT PHYSICAL THERAPY LOWER EXTREMITY    Patient Name: Dakota Gibbs MRN: [id number] DOB:1950/08/18, 72 y.o., male Today's Date: 09/19/2022  END OF SESSION:  PT End of Session - 09/19/22 0928     Visit Number 3    Date for PT Re-Evaluation 11/05/22    PT Start Time 0930    PT Stop Time 1015    PT Time Calculation (min) 45 min              Past Medical History:  Diagnosis Date   Allergy    enviornmental   Anxiety    Anxiety and depression 10/25/2012   Arthritis    self dx   Back pain    L4-L5 bulging disc, L5-S1 - bulging disc, pinched nerve in neck   Cataract    bilateral sx   Coronary artery disease    hx of balloon angioplasty with DR Bishop Limbo   Depression    Diabetes mellitus    pt denies   History of shingles 2009   Hyperlipidemia    on meds   Hypertension    on meds   Myocardial infarction (HCC) 1997   caused by a blood clot   Sleep apnea    cpap   Past Surgical History:  Procedure Laterality Date   ANGIOPLASTY  1997   BACK SURGERY  04/02/2022   basel cell     skin carcinoma removed x 4   BILATERAL CARPAL TUNNEL RELEASE     CARDIAC CATHETERIZATION     10/04/1999   COLONOSCOPY  2018   SA-MAC-suprep(good)-TA   DOPPLER ECHOCARDIOGRAPHY  07/09/2006   EF 50 to 55 %, LA mildy dilated   HERNIA REPAIR  1951   RIGHT   KNEE ARTHROSCOPY Right 2001   R   NM MYOCAR PERF WALL MOTION  08/22/2011   Mets 13,low risk study   POLYPECTOMY  2018   TA   TOTAL KNEE ARTHROPLASTY Left 08/13/2022   Procedure: TOTAL KNEE ARTHROPLASTY;  Surgeon: Jene Every, MD;  Location: [facility];  Service: Orthopedics;  Laterality: Left;   WRIST SURGERY  1984   right   Patient Active Problem List   Diagnosis Date Noted   PVC (premature ventricular contraction) 08/18/2022   Rash of back 08/18/2022   Sepsis (HCC) 08/17/2022   Hypokalemia 08/17/2022   Fever postop 08/16/2022   Hyponatremia 08/16/2022   SOB (shortness of breath) 08/16/2022   S/P TKR (total knee  replacement) using cement, left 08/13/2022   Cardiomegaly 06/23/2022   Hepatic steatosis 06/23/2022   Abdominal bloating 06/02/2022   Leg pain 02/18/2022   Injury of right shoulder 02/17/2022   Neuropathy associated with endocrine disorder (HCC) 02/17/2022   Need for influenza vaccination 02/17/2022   Prediabetes 12/03/2021   Bilateral carpal tunnel syndrome 10/01/2021   Abnormal urinalysis 06/10/2021   Receptive aphasia 05/30/2021   Memory loss 05/30/2021   Cough due to ACE inhibitor 08/27/2020   Cough 08/03/2020   Lower abdominal pain 08/03/2020   Benign prostatic hyperplasia with post-void dribbling 08/03/2020   Paresthesias 08/03/2020   Anemia 08/03/2020   Alcohol use 07/22/2019   Primary hypertension 07/22/2019   Basal cell carcinoma (BCC) in situ of skin 05/14/2017   Sinus bradycardia 03/15/2015   Coronary artery disease involving native coronary artery of native heart without angina pectoris 03/15/2015   PCP NOTES >>> 02/21/2015   Hyperlipidemia with target LDL less than 70 06/08/2013   Palpitations 06/08/2013   DJD (degenerative joint disease) 03/16/2013  Healthcare maintenance 03/16/2013   Anxiety and depression 10/25/2012   Lipoma 03/20/2011   DM2 (diabetes mellitus, type 2) (HCC) 07/10/2009   ERECTILE DYSFUNCTION 02/21/2008   CORONARY ARTERY DISEASE 02/21/2008   GERD 02/21/2008    PCP: Vonna Kotyk,   REFERRING PROVIDER: Jene Every, MD  REFERRING DIAG: s/p L TKA  THERAPY DIAG:  History of revision of total knee arthroplasty  Difficulty in walking, not elsewhere classified  Chronic pain of left knee  Weakness of both legs  Rationale for Evaluation and Treatment: Rehabilitation  ONSET DATE: 08/13/22  SUBJECTIVE: 09/19/22 everything is better and all meds seem to be working. Amb in without AD and good gait  SUBJECTIVE STATEMENT: 09/17/22: Since I saw the cardiologist and they made all of the medication changes, I feel much better, able to remain  upright most of yesterday without problems.    Eval: I had the knee surgery, but unfortunately I have developed some new cardiac Sx.  To see cardiologist at beginning of next week.  My blood pressure fluctuates between 190's systolic and 90's diastolic, then will drop to 90 /40.  I have several episodes a day in which my heart races and I feel short of breath, and I almost pass out.  Happened in the lobby again today .  Have worn a monitor and hope to have some medication changes and /or clarity about my heart on Monday.  Also twisted my L knee in bed a couple of nights ago so it is really sore/tender med and lateral knee.  PERTINENT HISTORY: Underwent L TKA 08/13/22, prior to that time underwent  LS surgery due to L lumbar radiculopathy PAIN:  Are you having pain? Yes: NPRS scale: 2/10 Pain location: L knee Pain description: consistent Aggravating factors: twisted in bed, any twisting motion L knee Relieving factors: meds, rest  PRECAUTIONS: Other: new cardiac/postural hypotensive issues since this surgery  WEIGHT BEARING RESTRICTIONS: No  FALLS:  Has patient fallen in last 6 months? Yes. Number of falls 1  LIVING ENVIRONMENT: Lives with: lives with their spouse Lives in: House/apartment Stairs: Yes: Internal: 11 steps; on right going up Has following equipment at home: Single point cane, r walker  OCCUPATION: desk job, on computer  PLOF: Independent  PATIENT GOALS: return to pain free walking, able to travel  NEXT MD VISIT: unclear  OBJECTIVE:   DIAGNOSTIC FINDINGS: na  PATIENT SURVEYS:  LEFS 21/80, 26%  COGNITION: Overall cognitive status: Within functional limits for tasks assessed     SENSATION: WFL  EDEMA: mild L ant knee   POSTURE:  habitually flexed L knee  PALPATION: Point tender lat tibia attachments of TFL L Normal patellar mobility  LOWER EXTREMITY ROM:  Active ROM Right eval Left eval  Hip flexion    Hip extension    Hip abduction    Hip  adduction    Hip internal rotation    Hip external rotation    Knee flexion  103  Knee extension  -13  Ankle dorsiflexion    Ankle plantarflexion    Ankle inversion    Ankle eversion     All testing wfl unless otherwise noted  LOWER EXTREMITY MMT:  MMT Right eval Left eval  Hip flexion  4  Hip extension    Hip abduction    Hip adduction    Hip internal rotation    Hip external rotation    Knee flexion  4-  Knee extension  4-  Ankle dorsiflexion    Ankle  plantarflexion  4/5  Ankle inversion    Ankle eversion       FUNCTIONAL TESTS: not performed today due to fluctuating hypotension, including episode in lobby today   GAIT: Distance walked: 74' in clinic Assistive device utilized: Single point cane Level of assistance: Modified independence Comments: L knee flexed , decreased stance time L and decreased step length R   TODAY'S TREATMENT:                                                                                                                              DATE:  09/19/22 Nustep L 5 Gtband HS curls 2 sets 10 Gtband TKE sitting 2 sets 10 hold 3 sec Ext stretching AROM sitting 7-110 LAQ 3# hold 3 sec 2 sets 10 Leg Press 2 sets 10 30#, then RT leg only unlocked for ROM Calf Raises 30# 2 sets 15 Educ on elevation for swelling above heart 2 x a day VASO for swelling    09/17/22 : Therapeutic exercise: instructed in the following to engage B LE strength, flexibility L knee. Again emphasized achieving L knee extension flexibility and strength  Nustep level 3 Ue's and LE's 6 min  L knee flexion sitting : 110, L knee extension supine with overpressure: -10 Supine L hamstring stretch, therapist assisted , applying overpressure L post knee capsule Prone for cross friction massage L hamstring Prone TKE's 5 sec holds, 10 reps Standing forefeet on metal bar, B heel raises and calf stretch, mass practice Kinesiotaping L ant knee distal attachment L tibial  tubercle, proximal attachment L quads, surrounding patella for tracking support, proprioception.     09/10/22  Therapeutic exercise:  Instructed patient in the following exercises to add at home: see HEP described at home.  Also inst in seated L knee  post capsule stretch with L foot on stool, ankle below knee and knee below hip.  Did not utilized any upright activities or bicycle, Nustep due to cardiac Sx. Also kinesiotape, 2 I Pieces extending from tibial tubercle to mid thigh, surrounding patella, to provide patient with more stability, support for sleep, rolling in bed  PATIENT EDUCATION:  Education details: POC, GOALS, expectations for outcomes Person educated: Patient Education method: Medical illustrator Education comprehension: verbalized understanding, returned demonstration, and verbal cues required  HOME EXERCISE PROGRAM: Access Code: [email or web address] URL: [email or web address] Date: 09/10/2022 Prepared by: Amy Speaks  Exercises - Supine Quad Set on Towel Roll  - 1 x daily - 7 x weekly - 3 sets - 10 reps - Supine Calf Stretch with Strap  - 1 x daily - 7 x weekly - 3 sets - 10 reps - Long Sitting Ankle Plantar Flexion with Resistance  - 1 x daily - 7 x weekly - 3 sets - 10 reps  ASSESSMENT:  CLINICAL IMPRESSION:  Pt arrived feeling much better and we were able to progress ex for strength and ROM and he did very well. Cuing to  engage quad vs hip flexor with some ex and cued to work on full ROM OBJECTIVE IMPAIRMENTS: cardiopulmonary status limiting activity, decreased activity tolerance, decreased mobility, difficulty walking, decreased ROM, decreased strength, increased edema, impaired flexibility, and pain.   ACTIVITY LIMITATIONS: carrying, lifting, bending, standing, squatting, sleeping, stairs, transfers, bed mobility, and locomotion level  PARTICIPATION LIMITATIONS: cleaning, laundry, shopping, community activity, occupation, and yard work  PERSONAL  FACTORS: Age, Past/current experiences, Time since onset of injury/illness/exacerbation, and 1-2 comorbidities: coronary heard disease, recent L lumbar fusion  are also affecting patient's functional outcome.   REHAB POTENTIAL: Good  CLINICAL DECISION MAKING: Stable/uncomplicated  EVALUATION COMPLEXITY: Low   GOALS: Goals reviewed with patient? Yes  SHORT TERM GOALS: Target date: 2 weeks 09/24/22  I HEP Baseline:established at eval Goal status: IN PROGRESS   LONG TERM GOALS: Target date: 11/05/22  Improve L knee flexibility to -5 extension to 115 flexion Baseline: -13 to 102 Goal status: INITIAL  2.  FGA 28/30 Baseline: not assessed initially due to cardiac Sx Goal status: INITIAL  3.  LEFS improve from 21/80 to 40/80 Baseline: 21/80 Goal status: INITIAL  4.  Strength L quads, hamstrings, hip all planes 5/5 for normal transfers, gait over even and uneven terrain Baseline: 4-/5 Goal status: INITIAL    PLAN:  PT FREQUENCY: 2x/week  PT DURATION: 8 weeks  PLANNED INTERVENTIONS: Therapeutic exercises, Therapeutic activity, Neuromuscular re-education, Balance training, Gait training, Patient/Family education, Self Care, and Joint mobilization  PLAN FOR NEXT SESSION: strengthening and gym activities for L knee, manual techniques, stretch L post knee   Maleeyah Mccaughey,ANGIE, PTA 09/19/2022, 9:28 AM [facility] at [facility] [address] [facility]. [place], [place], [address] Phone: [phone]   Fax:  [phone]  Patient Details  Name: Dakota Gibbs MRN: [id number] Date of Birth: [birth year] Referring Provider:  Mliss Sax,*  Encounter Date: 09/19/2022   Suanne Marker, PTA 09/19/2022, 9:28 AM  [place] at [facility] [address] [address] [facility] [place], [place], [address] Phone: [phone]   Fax:  [phone]

## 2022-09-19 NOTE — Telephone Encounter (Signed)
Pt updated that MD will review next week. Preliminary findings given to pt. Patient verbalized understanding and agreeable to plan.

## 2022-09-24 ENCOUNTER — Other Ambulatory Visit: Payer: Self-pay

## 2022-09-24 ENCOUNTER — Ambulatory Visit: Payer: Medicare Other

## 2022-09-24 DIAGNOSIS — Z9889 Other specified postprocedural states: Secondary | ICD-10-CM

## 2022-09-24 DIAGNOSIS — G8929 Other chronic pain: Secondary | ICD-10-CM

## 2022-09-24 DIAGNOSIS — Z96659 Presence of unspecified artificial knee joint: Secondary | ICD-10-CM

## 2022-09-24 DIAGNOSIS — M25662 Stiffness of left knee, not elsewhere classified: Secondary | ICD-10-CM | POA: Diagnosis not present

## 2022-09-24 DIAGNOSIS — R29898 Other symptoms and signs involving the musculoskeletal system: Secondary | ICD-10-CM | POA: Diagnosis not present

## 2022-09-24 DIAGNOSIS — R262 Difficulty in walking, not elsewhere classified: Secondary | ICD-10-CM | POA: Diagnosis not present

## 2022-09-24 DIAGNOSIS — M25562 Pain in left knee: Secondary | ICD-10-CM | POA: Diagnosis not present

## 2022-09-24 NOTE — Therapy (Signed)
OUTPATIENT PHYSICAL THERAPY LOWER EXTREMITY TREATMENT   Patient Name: Dakota Gibbs MRN: [id number] DOB:11/29/1950, 72 y.o., male Today's Date: 09/24/2022  END OF SESSION:  PT End of Session - 09/24/22 0808     Visit Number 4    Date for PT Re-Evaluation 11/05/22    PT Start Time 0804    PT Stop Time 0845    PT Time Calculation (min) 41 min    Activity Tolerance Patient tolerated treatment well;Other (comment)    Behavior During Therapy WFL for tasks assessed/performed               Past Medical History:  Diagnosis Date   Allergy    enviornmental   Anxiety    Anxiety and depression 10/25/2012   Arthritis    self dx   Back pain    L4-L5 bulging disc, L5-S1 - bulging disc, pinched nerve in neck   Cataract    bilateral sx   Coronary artery disease    hx of balloon angioplasty with DR Bishop Limbo   Depression    Diabetes mellitus    pt denies   History of shingles 2009   Hyperlipidemia    on meds   Hypertension    on meds   Myocardial infarction (HCC) 1997   caused by a blood clot   Sleep apnea    cpap   Past Surgical History:  Procedure Laterality Date   ANGIOPLASTY  1997   BACK SURGERY  04/02/2022   basel cell     skin carcinoma removed x 4   BILATERAL CARPAL TUNNEL RELEASE     CARDIAC CATHETERIZATION     10/04/1999   COLONOSCOPY  2018   SA-MAC-suprep(good)-TA   DOPPLER ECHOCARDIOGRAPHY  07/09/2006   EF 50 to 55 %, LA mildy dilated   HERNIA REPAIR  1951   RIGHT   KNEE ARTHROSCOPY Right 2001   R   NM MYOCAR PERF WALL MOTION  08/22/2011   Mets 13,low risk study   POLYPECTOMY  2018   TA   TOTAL KNEE ARTHROPLASTY Left 08/13/2022   Procedure: TOTAL KNEE ARTHROPLASTY;  Surgeon: Jene Every, MD;  Location: [facility];  Service: Orthopedics;  Laterality: Left;   WRIST SURGERY  1984   right   Patient Active Problem List   Diagnosis Date Noted   PVC (premature ventricular contraction) 08/18/2022   Rash of back 08/18/2022   Sepsis (HCC) 08/17/2022    Hypokalemia 08/17/2022   Fever postop 08/16/2022   Hyponatremia 08/16/2022   SOB (shortness of breath) 08/16/2022   S/P TKR (total knee replacement) using cement, left 08/13/2022   Cardiomegaly 06/23/2022   Hepatic steatosis 06/23/2022   Abdominal bloating 06/02/2022   Leg pain 02/18/2022   Injury of right shoulder 02/17/2022   Neuropathy associated with endocrine disorder (HCC) 02/17/2022   Need for influenza vaccination 02/17/2022   Prediabetes 12/03/2021   Bilateral carpal tunnel syndrome 10/01/2021   Abnormal urinalysis 06/10/2021   Receptive aphasia 05/30/2021   Memory loss 05/30/2021   Cough due to ACE inhibitor 08/27/2020   Cough 08/03/2020   Lower abdominal pain 08/03/2020   Benign prostatic hyperplasia with post-void dribbling 08/03/2020   Paresthesias 08/03/2020   Anemia 08/03/2020   Alcohol use 07/22/2019   Primary hypertension 07/22/2019   Basal cell carcinoma (BCC) in situ of skin 05/14/2017   Sinus bradycardia 03/15/2015   Coronary artery disease involving native coronary artery of native heart without angina pectoris 03/15/2015   PCP NOTES >>> 02/21/2015  Hyperlipidemia with target LDL less than 70 06/08/2013   Palpitations 06/08/2013   DJD (degenerative joint disease) 03/16/2013   Healthcare maintenance 03/16/2013   Anxiety and depression 10/25/2012   Lipoma 03/20/2011   DM2 (diabetes mellitus, type 2) (HCC) 07/10/2009   ERECTILE DYSFUNCTION 02/21/2008   CORONARY ARTERY DISEASE 02/21/2008   GERD 02/21/2008    PCP: Vonna Kotyk,   REFERRING PROVIDER: Jene Every, MD  REFERRING DIAG: s/p L TKA  THERAPY DIAG:  History of revision of total knee arthroplasty  Difficulty in walking, not elsewhere classified  Chronic pain of left knee  Weakness of both legs  History of lumbar laminectomy for spinal cord decompression  Rationale for Evaluation and Treatment: Rehabilitation  ONSET DATE: 08/13/22  SUBJECTIVE:  SUBJECTIVE STATEMENT: 09/24/22:  overall better, all the medication changes in regard to my heart have worked.  My R medial hamstring(points to medial psot thigh) has been hurting.  Eval: I had the knee surgery, but unfortunately I have developed some new cardiac Sx.  To see cardiologist at beginning of next week.  My blood pressure fluctuates between 190's systolic and 90's diastolic, then will drop to 90 /40.  I have several episodes a day in which my heart races and I feel short of breath, and I almost pass out.  Happened in the lobby again today .  Have worn a monitor and hope to have some medication changes and /or clarity about my heart on Monday.  Also twisted my L knee in bed a couple of nights ago so it is really sore/tender med and lateral knee.  PERTINENT HISTORY: Underwent L TKA 08/13/22, prior to that time underwent  LS surgery due to L lumbar radiculopathy PAIN:  Are you having pain? Yes: NPRS scale: 2/10 Pain location: L knee Pain description: consistent Aggravating factors: twisted in bed, any twisting motion L knee Relieving factors: meds, rest  PRECAUTIONS: Other: new cardiac/postural hypotensive issues since this surgery  WEIGHT BEARING RESTRICTIONS: No  FALLS:  Has patient fallen in last 6 months? Yes. Number of falls 1  LIVING ENVIRONMENT: Lives with: lives with their spouse Lives in: House/apartment Stairs: Yes: Internal: 11 steps; on right going up Has following equipment at home: Single point cane, r walker  OCCUPATION: desk job, on computer  PLOF: Independent  PATIENT GOALS: return to pain free walking, able to travel  NEXT MD VISIT: unclear  OBJECTIVE:   DIAGNOSTIC FINDINGS: na  PATIENT SURVEYS:  LEFS 21/80, 26%  COGNITION: Overall cognitive status: Within functional limits for tasks assessed     SENSATION: WFL  EDEMA: mild L ant knee   POSTURE:  habitually flexed L knee  PALPATION: Point tender lat tibia attachments of TFL L Normal patellar mobility  LOWER EXTREMITY  ROM:  Active ROM Right eval Left eval  Hip flexion    Hip extension    Hip abduction    Hip adduction    Hip internal rotation    Hip external rotation    Knee flexion  103  Knee extension  -13  Ankle dorsiflexion    Ankle plantarflexion    Ankle inversion    Ankle eversion     All testing wfl unless otherwise noted  LOWER EXTREMITY MMT:  MMT Right eval Left eval  Hip flexion  4  Hip extension    Hip abduction    Hip adduction    Hip internal rotation    Hip external rotation    Knee flexion  4-  Knee extension  4-  Ankle dorsiflexion    Ankle plantarflexion  4/5  Ankle inversion    Ankle eversion       FUNCTIONAL TESTS: not performed today due to fluctuating hypotension, including episode in lobby today   GAIT: Distance walked: 23' in clinic Assistive device utilized: Single point cane Level of assistance: Modified independence Comments: L knee flexed , decreased stance time L and decreased step length R   TODAY'S TREATMENT:                                                                                                                              DATE: 09/24/22:  Nustep level 5 Le's only 6 min Prone for massage gun L hamstrings Prone TKE's ankles over bolster 5 sec holds, 10 re[s Prone L knee flexion stretch, with contract/ relax , overpressure provided by PT LAQ 3# hold 3 sec 2 sets 10 In ll bars for lateral heel taps with R leg from 2 " step to isolate distal quads L In ll bars for heel to rocks, patient needed frequent UE support to avoid loss of balance posterior In ll bars slides R LE cw, ccw with R LE weight bearing. Mass practice.  Toe raises, plantarflexor stretch B ankles, forefeet on black bar  Leg press, B 30#, 2 sets 10,  Leg press, ankle pumps 30#, 2 sets 10  Vasopneumatic: Game ready at end of session, 10 min at 34 degrees to ease edema and post ex soreness/pain, with L LE elevated above heart 09/19/22 Nustep L 5 Gtband HS curls 2  sets 10 Gtband TKE sitting 2 sets 10 hold 3 sec Ext stretching AROM sitting 7-110 LAQ 3# hold 3 sec 2 sets 10 Leg Press 2 sets 10 30#, then RT leg only unlocked for ROM Calf Raises 30# 2 sets 15 Educ on elevation for swelling above heart 2 x a day VASO for swelling    09/17/22 : Therapeutic exercise: instructed in the following to engage B LE strength, flexibility L knee. Again emphasized achieving L knee extension flexibility and strength  Nustep level 3 Ue's and LE's 6 min  L knee flexion sitting : 110, L knee extension supine with overpressure: -10 Supine L hamstring stretch, therapist assisted , applying overpressure L post knee capsule Prone for cross friction massage L hamstring Prone TKE's 5 sec holds, 10 reps Standing forefeet on metal bar, B heel raises and calf stretch, mass practice Kinesiotaping L ant knee distal attachment L tibial tubercle, proximal attachment L quads, surrounding patella for tracking support, proprioception.     09/10/22  Therapeutic exercise:  Instructed patient in the following exercises to add at home: see HEP described at home.  Also inst in seated L knee  post capsule stretch with L foot on stool, ankle below knee and knee below hip.  Did not utilized any upright activities or bicycle, Nustep due to cardiac Sx. Also kinesiotape, 2 I Pieces extending from tibial  tubercle to mid thigh, surrounding patella, to provide patient with more stability, support for sleep, rolling in bed  PATIENT EDUCATION:  Education details: POC, GOALS, expectations for outcomes Person educated: Patient Education method: Medical illustrator Education comprehension: verbalized understanding, returned demonstration, and verbal cues required  HOME EXERCISE PROGRAM: Access Code: [email or web address] URL: [email or web address] Date: 09/10/2022 Prepared by: Linton Rump Cortne Amara  Exercises - Supine Quad Set on Towel Roll  - 1 x daily - 7 x weekly - 3 sets - 10 reps -  Supine Calf Stretch with Strap  - 1 x daily - 7 x weekly - 3 sets - 10 reps - Long Sitting Ankle Plantar Flexion with Resistance  - 1 x daily - 7 x weekly - 3 sets - 10 reps  ASSESSMENT:  CLINICAL IMPRESSION: recovering well from L TKA, still main restriction is L knee extension flexibility, has habitually flexed L knee.  Tolerated all activities well.  Progressed more balance and proprioceptive training today.  OBJECTIVE IMPAIRMENTS: cardiopulmonary status limiting activity, decreased activity tolerance, decreased mobility, difficulty walking, decreased ROM, decreased strength, increased edema, impaired flexibility, and pain.   ACTIVITY LIMITATIONS: carrying, lifting, bending, standing, squatting, sleeping, stairs, transfers, bed mobility, and locomotion level  PARTICIPATION LIMITATIONS: cleaning, laundry, shopping, community activity, occupation, and yard work  PERSONAL FACTORS: Age, Past/current experiences, Time since onset of injury/illness/exacerbation, and 1-2 comorbidities: coronary heard disease, recent L lumbar fusion  are also affecting patient's functional outcome.   REHAB POTENTIAL: Good  CLINICAL DECISION MAKING: Stable/uncomplicated  EVALUATION COMPLEXITY: Low   GOALS: Goals reviewed with patient? Yes  SHORT TERM GOALS: Target date: 2 weeks 09/24/22  I HEP Baseline:established at eval Goal status: IN PROGRESS   LONG TERM GOALS: Target date: 11/05/22  Improve L knee flexibility to -5 extension to 115 flexion Baseline: -13 to 102 Goal status: INITIAL  2.  FGA 28/30 Baseline: not assessed initially due to cardiac Sx Goal status: INITIAL  3.  LEFS improve from 21/80 to 40/80 Baseline: 21/80 Goal status: INITIAL  4.  Strength L quads, hamstrings, hip all planes 5/5 for normal transfers, gait over even and uneven terrain Baseline: 4-/5 Goal status: INITIAL    PLAN:  PT FREQUENCY: 2x/week  PT DURATION: 8 weeks  PLANNED INTERVENTIONS: Therapeutic  exercises, Therapeutic activity, Neuromuscular re-education, Balance training, Gait training, Patient/Family education, Self Care, and Joint mobilization  PLAN FOR NEXT SESSION: strengthening and gym activities for L knee, manual techniques, stretch L post knee   Alvine Mostafa L Susie Ehresman, PT 09/24/2022, 12:45 PM [facility] at [facility] [address] [facility] [place], [place], [address] Phone: [phone]   Fax:  [phone]  Patient Details  Name: JAYZION JANKIEWICZ MRN: [id number] Date of Birth: 26-Feb-1951 Referring Provider:  Mliss Sax,*  Encounter Date: 09/24/2022   Early Chars, PT 09/24/2022, 12:45 PM  [facility] [address] [facility] [place], [place], [address] Phone: [phone]   Fax:  [phone]

## 2022-09-26 ENCOUNTER — Ambulatory Visit: Payer: Medicare Other

## 2022-09-26 DIAGNOSIS — M25662 Stiffness of left knee, not elsewhere classified: Secondary | ICD-10-CM | POA: Diagnosis not present

## 2022-09-26 DIAGNOSIS — Z9889 Other specified postprocedural states: Secondary | ICD-10-CM | POA: Diagnosis not present

## 2022-09-26 DIAGNOSIS — Z96659 Presence of unspecified artificial knee joint: Secondary | ICD-10-CM

## 2022-09-26 DIAGNOSIS — R29898 Other symptoms and signs involving the musculoskeletal system: Secondary | ICD-10-CM

## 2022-09-26 DIAGNOSIS — M25562 Pain in left knee: Secondary | ICD-10-CM | POA: Diagnosis not present

## 2022-09-26 DIAGNOSIS — R262 Difficulty in walking, not elsewhere classified: Secondary | ICD-10-CM | POA: Diagnosis not present

## 2022-09-26 DIAGNOSIS — G8929 Other chronic pain: Secondary | ICD-10-CM

## 2022-09-26 NOTE — Therapy (Signed)
OUTPATIENT PHYSICAL THERAPY LOWER EXTREMITY TREATMENT   Patient Name: Dakota Gibbs MRN: [id number] DOB:12/20/1950, 72 y.o., male Today's Date: 09/26/2022  END OF SESSION:  PT End of Session - 09/26/22 0848     Visit Number 5    Date for PT Re-Evaluation 11/05/22    PT Start Time 0845    PT Stop Time 0935    PT Time Calculation (min) 50 min    Activity Tolerance Patient tolerated treatment well    Behavior During Therapy [facility] for tasks assessed/performed                Past Medical History:  Diagnosis Date   Allergy    enviornmental   Anxiety    Anxiety and depression 10/25/2012   Arthritis    self dx   Back pain    L4-L5 bulging disc, L5-S1 - bulging disc, pinched nerve in neck   Cataract    bilateral sx   Coronary artery disease    hx of balloon angioplasty with DR Bishop Limbo   Depression    Diabetes mellitus    pt denies   History of shingles 2009   Hyperlipidemia    on meds   Hypertension    on meds   Myocardial infarction (HCC) 1997   caused by a blood clot   Sleep apnea    cpap   Past Surgical History:  Procedure Laterality Date   ANGIOPLASTY  1997   BACK SURGERY  04/02/2022   basel cell     skin carcinoma removed x 4   BILATERAL CARPAL TUNNEL RELEASE     CARDIAC CATHETERIZATION     10/04/1999   COLONOSCOPY  2018   SA-MAC-suprep(good)-TA   DOPPLER ECHOCARDIOGRAPHY  07/09/2006   EF 50 to 55 %, LA mildy dilated   HERNIA REPAIR  1951   RIGHT   KNEE ARTHROSCOPY Right 2001   R   NM MYOCAR PERF WALL MOTION  08/22/2011   Mets 13,low risk study   POLYPECTOMY  2018   TA   TOTAL KNEE ARTHROPLASTY Left 08/13/2022   Procedure: TOTAL KNEE ARTHROPLASTY;  Surgeon: Jene Every, MD;  Location: [facility];  Service: Orthopedics;  Laterality: Left;   WRIST SURGERY  1984   right   Patient Active Problem List   Diagnosis Date Noted   PVC (premature ventricular contraction) 08/18/2022   Rash of back 08/18/2022   Sepsis (HCC) 08/17/2022   Hypokalemia  08/17/2022   Fever postop 08/16/2022   Hyponatremia 08/16/2022   SOB (shortness of breath) 08/16/2022   S/P TKR (total knee replacement) using cement, left 08/13/2022   Cardiomegaly 06/23/2022   Hepatic steatosis 06/23/2022   Abdominal bloating 06/02/2022   Leg pain 02/18/2022   Injury of right shoulder 02/17/2022   Neuropathy associated with endocrine disorder (HCC) 02/17/2022   Need for influenza vaccination 02/17/2022   Prediabetes 12/03/2021   Bilateral carpal tunnel syndrome 10/01/2021   Abnormal urinalysis 06/10/2021   Receptive aphasia 05/30/2021   Memory loss 05/30/2021   Cough due to ACE inhibitor 08/27/2020   Cough 08/03/2020   Lower abdominal pain 08/03/2020   Benign prostatic hyperplasia with post-void dribbling 08/03/2020   Paresthesias 08/03/2020   Anemia 08/03/2020   Alcohol use 07/22/2019   Primary hypertension 07/22/2019   Basal cell carcinoma (BCC) in situ of skin 05/14/2017   Sinus bradycardia 03/15/2015   Coronary artery disease involving native coronary artery of native heart without angina pectoris 03/15/2015   PCP NOTES >>> 02/21/2015  Hyperlipidemia with target LDL less than 70 06/08/2013   Palpitations 06/08/2013   DJD (degenerative joint disease) 03/16/2013   Healthcare maintenance 03/16/2013   Anxiety and depression 10/25/2012   Lipoma 03/20/2011   DM2 (diabetes mellitus, type 2) (HCC) 07/10/2009   ERECTILE DYSFUNCTION 02/21/2008   CORONARY ARTERY DISEASE 02/21/2008   GERD 02/21/2008    PCP: Vonna Kotyk,   REFERRING PROVIDER: Jene Every, MD  REFERRING DIAG: s/p L TKA  THERAPY DIAG:  History of revision of total knee arthroplasty  Difficulty in walking, not elsewhere classified  Chronic pain of left knee  Weakness of both legs  History of lumbar laminectomy for spinal cord decompression  Rationale for Evaluation and Treatment: Rehabilitation  ONSET DATE: 08/13/22  SUBJECTIVE:  SUBJECTIVE STATEMENT: 09/26/22: Pt reports a  good response to last PT session. He gets pain near the end of the day. He had a hard time last night and needed to compress it to sleep. He feels pretty good now and only has 2/10 pain.  Eval: I had the knee surgery, but unfortunately I have developed some new cardiac Sx.  To see cardiologist at beginning of next week.  My blood pressure fluctuates between 190's systolic and 90's diastolic, then will drop to 90 /40.  I have several episodes a day in which my heart races and I feel short of breath, and I almost pass out.  Happened in the lobby again today .  Have worn a monitor and hope to have some medication changes and /or clarity about my heart on Monday.  Also twisted my L knee in bed a couple of nights ago so it is really sore/tender med and lateral knee.  PERTINENT HISTORY: Underwent L TKA 08/13/22, prior to that time underwent  LS surgery due to L lumbar radiculopathy PAIN:  Are you having pain? Yes: NPRS scale: 2/10 Pain location: L knee Pain description: consistent Aggravating factors: twisted in bed, any twisting motion L knee Relieving factors: meds, rest  PRECAUTIONS: Other: new cardiac/postural hypotensive issues since this surgery  WEIGHT BEARING RESTRICTIONS: No  FALLS:  Has patient fallen in last 6 months? Yes. Number of falls 1  LIVING ENVIRONMENT: Lives with: lives with their spouse Lives in: House/apartment Stairs: Yes: Internal: 11 steps; on right going up Has following equipment at home: Single point cane, r walker  OCCUPATION: desk job, on computer  PLOF: Independent  PATIENT GOALS: return to pain free walking, able to travel  NEXT MD VISIT: unclear  OBJECTIVE:   DIAGNOSTIC FINDINGS: na  PATIENT SURVEYS:  LEFS 21/80, 26%  COGNITION: Overall cognitive status: Within functional limits for tasks assessed     SENSATION: WFL  EDEMA: mild L ant knee   POSTURE:  habitually flexed L knee  PALPATION: Point tender lat tibia attachments of TFL L Normal  patellar mobility  LOWER EXTREMITY ROM:  Active ROM Right eval Left eval  Hip flexion    Hip extension    Hip abduction    Hip adduction    Hip internal rotation    Hip external rotation    Knee flexion  103  Knee extension  -13  Ankle dorsiflexion    Ankle plantarflexion    Ankle inversion    Ankle eversion     All testing wfl unless otherwise noted  LOWER EXTREMITY MMT:  MMT Right eval Left eval  Hip flexion  4  Hip extension    Hip abduction    Hip adduction    Hip internal  rotation    Hip external rotation    Knee flexion  4-  Knee extension  4-  Ankle dorsiflexion    Ankle plantarflexion  4/5  Ankle inversion    Ankle eversion       FUNCTIONAL TESTS: not performed today due to fluctuating hypotension, including episode in lobby today   GAIT: Distance walked: 3' in clinic Assistive device utilized: Single point cane Level of assistance: Modified independence Comments: L knee flexed , decreased stance time L and decreased step length R   TODAY'S TREATMENT:                                                                                                                              DATE: [facility] Adult PT Treatment:                                                DATE: 09/26/22 Therapeutic Exercise: NuStep BLE only L5 x6 min Holding posterior thigh, flex/ext x3" each x12 3# LAQ 3" hold, 3" eccentric x15 Calf Raises with light BUE support x15 with quad squeeze Eccentric STS to low table with focus on knee extension during concentric x15 Manual Therapy: Tibial block with block and grade 3 AP femur mobs into extension (to tolerance) End range knee flexion with tib on femur mobs for knee extension grade 3-4 PROM knee extension and flexion Patellar glides multi-directional Gait: 3# weight and no weight, 3 large laps, focusing on heel strike and push-off with quad squeeze during swing phase  Modalities: Vasopneumatic: Game ready at end of session, 10 min at  34 degrees, medium pressure, to ease edema and post ex soreness/pain, with L LE elevated above heart   09/24/22:  Nustep level 5 Le's only 6 min Prone for massage gun L hamstrings Prone TKE's ankles over bolster 5 sec holds, 10 re[s Prone L knee flexion stretch, with contract/ relax , overpressure provided by PT LAQ 3# hold 3 sec 2 sets 10 In ll bars for lateral heel taps with R leg from 2 " step to isolate distal quads L In ll bars for heel to rocks, patient needed frequent UE support to avoid loss of balance posterior In ll bars slides R LE cw, ccw with R LE weight bearing. Mass practice.  Toe raises, plantarflexor stretch B ankles, forefeet on black bar  Leg press, B 30#, 2 sets 10,  Leg press, ankle pumps 30#, 2 sets 10  Vasopneumatic: Game ready at end of session, 10 min at 34 degrees to ease edema and post ex soreness/pain, with L LE elevated above heart 09/19/22 Nustep L 5 Gtband HS curls 2 sets 10 Gtband TKE sitting 2 sets 10 hold 3 sec Ext stretching AROM sitting 7-110 LAQ 3# hold 3 sec 2 sets 10 Leg Press 2 sets 10 30#,  then RT leg only unlocked for ROM Calf Raises 30# 2 sets 15 Educ on elevation for swelling above heart 2 x a day VASO for swelling    09/17/22 : Therapeutic exercise: instructed in the following to engage B LE strength, flexibility L knee. Again emphasized achieving L knee extension flexibility and strength  Nustep level 3 Ue's and LE's 6 min  L knee flexion sitting : 110, L knee extension supine with overpressure: -10 Supine L hamstring stretch, therapist assisted , applying overpressure L post knee capsule Prone for cross friction massage L hamstring Prone TKE's 5 sec holds, 10 reps Standing forefeet on metal bar, B heel raises and calf stretch, mass practice Kinesiotaping L ant knee distal attachment L tibial tubercle, proximal attachment L quads, surrounding patella for tracking support, proprioception.     09/10/22  Therapeutic exercise:   Instructed patient in the following exercises to add at home: see HEP described at home.  Also inst in seated L knee  post capsule stretch with L foot on stool, ankle below knee and knee below hip.  Did not utilized any upright activities or bicycle, Nustep due to cardiac Sx. Also kinesiotape, 2 I Pieces extending from tibial tubercle to mid thigh, surrounding patella, to provide patient with more stability, support for sleep, rolling in bed  PATIENT EDUCATION:  Education details: POC, GOALS, expectations for outcomes Person educated: Patient Education method: Medical illustrator Education comprehension: verbalized understanding, returned demonstration, and verbal cues required  HOME EXERCISE PROGRAM: Access Code: [email or web address] URL: [email or web address] Date: 09/10/2022 Prepared by: Amy Speaks  Exercises - Supine Quad Set on Towel Roll  - 1 x daily - 7 x weekly - 3 sets - 10 reps - Supine Calf Stretch with Strap  - 1 x daily - 7 x weekly - 3 sets - 10 reps - Long Sitting Ankle Plantar Flexion with Resistance  - 1 x daily - 7 x weekly - 3 sets - 10 reps  ASSESSMENT:  CLINICAL IMPRESSION: Pt presents with minimal pain today. He tolerated treatment well with no adverse reactions. He is limited by pain with manual techniques, extension > flexion, and continues to lack 5-10 degrees of extension. Educated pt to focus on extension more than flexion at home. He will continue to benefit from further skilled PT to restore ROM, strength, and normalize gait.  OBJECTIVE IMPAIRMENTS: cardiopulmonary status limiting activity, decreased activity tolerance, decreased mobility, difficulty walking, decreased ROM, decreased strength, increased edema, impaired flexibility, and pain.   ACTIVITY LIMITATIONS: carrying, lifting, bending, standing, squatting, sleeping, stairs, transfers, bed mobility, and locomotion level  PARTICIPATION LIMITATIONS: cleaning, laundry, shopping, community  activity, occupation, and yard work  PERSONAL FACTORS: Age, Past/current experiences, Time since onset of injury/illness/exacerbation, and 1-2 comorbidities: coronary heard disease, recent L lumbar fusion  are also affecting patient's functional outcome.   REHAB POTENTIAL: Good  CLINICAL DECISION MAKING: Stable/uncomplicated  EVALUATION COMPLEXITY: Low   GOALS: Goals reviewed with patient? Yes  SHORT TERM GOALS: Target date: 2 weeks 09/24/22  I HEP Baseline:established at eval Goal status: IN PROGRESS   LONG TERM GOALS: Target date: 11/05/22  Improve L knee flexibility to -5 extension to 115 flexion Baseline: -13 to 102 Goal status: INITIAL  2.  FGA 28/30 Baseline: not assessed initially due to cardiac Sx Goal status: INITIAL  3.  LEFS improve from 21/80 to 40/80 Baseline: 21/80 Goal status: INITIAL  4.  Strength L quads, hamstrings, hip all planes 5/5 for normal transfers, gait over even and uneven  terrain Baseline: 4-/5 Goal status: INITIAL    PLAN:  PT FREQUENCY: 2x/week  PT DURATION: 8 weeks  PLANNED INTERVENTIONS: Therapeutic exercises, Therapeutic activity, Neuromuscular re-education, Balance training, Gait training, Patient/Family education, Self Care, and Joint mobilization  PLAN FOR NEXT SESSION: strengthening and gym activities for L knee, manual techniques, stretch L post knee    Marcelline Mates, PT 09/26/2022, 10:54 AM  [facility] [address] [facility]. [place], [place], [address] Phone: [phone]   Fax:  [phone]

## 2022-09-26 NOTE — Telephone Encounter (Signed)
Pt reports that someone gave him result findings. I informed him that Dr. Elberta Fortis said showed less than 1% PVC, and majority was normal sinus rhythm Pt appreciates my follow up on this and ensuring he got result findings.

## 2022-09-30 ENCOUNTER — Other Ambulatory Visit: Payer: Self-pay | Admitting: Family Medicine

## 2022-09-30 DIAGNOSIS — E1142 Type 2 diabetes mellitus with diabetic polyneuropathy: Secondary | ICD-10-CM

## 2022-10-01 ENCOUNTER — Other Ambulatory Visit: Payer: Self-pay

## 2022-10-01 ENCOUNTER — Ambulatory Visit: Payer: Medicare Other

## 2022-10-01 DIAGNOSIS — M25562 Pain in left knee: Secondary | ICD-10-CM | POA: Diagnosis not present

## 2022-10-01 DIAGNOSIS — Z96659 Presence of unspecified artificial knee joint: Secondary | ICD-10-CM | POA: Diagnosis not present

## 2022-10-01 DIAGNOSIS — R262 Difficulty in walking, not elsewhere classified: Secondary | ICD-10-CM | POA: Diagnosis not present

## 2022-10-01 DIAGNOSIS — G8929 Other chronic pain: Secondary | ICD-10-CM

## 2022-10-01 DIAGNOSIS — M25662 Stiffness of left knee, not elsewhere classified: Secondary | ICD-10-CM | POA: Diagnosis not present

## 2022-10-01 DIAGNOSIS — R29898 Other symptoms and signs involving the musculoskeletal system: Secondary | ICD-10-CM | POA: Diagnosis not present

## 2022-10-01 DIAGNOSIS — Z9889 Other specified postprocedural states: Secondary | ICD-10-CM | POA: Diagnosis not present

## 2022-10-01 NOTE — Therapy (Signed)
OUTPATIENT PHYSICAL THERAPY LOWER EXTREMITY TREATMENT   Patient Name: Dakota Gibbs MRN: [id number] DOB:1950-10-24, 72 y.o., male Today's Date: 10/01/2022  END OF SESSION:       Past Medical History:  Diagnosis Date   Allergy    enviornmental   Anxiety    Anxiety and depression 10/25/2012   Arthritis    self dx   Back pain    L4-L5 bulging disc, L5-S1 - bulging disc, pinched nerve in neck   Cataract    bilateral sx   Coronary artery disease    hx of balloon angioplasty with DR Bishop Limbo   Depression    Diabetes mellitus    pt denies   History of shingles 2009   Hyperlipidemia    on meds   Hypertension    on meds   Myocardial infarction (HCC) 1997   caused by a blood clot   Sleep apnea    cpap   Past Surgical History:  Procedure Laterality Date   ANGIOPLASTY  1997   BACK SURGERY  04/02/2022   basel cell     skin carcinoma removed x 4   BILATERAL CARPAL TUNNEL RELEASE     CARDIAC CATHETERIZATION     10/04/1999   COLONOSCOPY  2018   SA-MAC-suprep(good)-TA   DOPPLER ECHOCARDIOGRAPHY  07/09/2006   EF 50 to 55 %, LA mildy dilated   HERNIA REPAIR  1951   RIGHT   KNEE ARTHROSCOPY Right 2001   R   NM MYOCAR PERF WALL MOTION  08/22/2011   Mets 13,low risk study   POLYPECTOMY  2018   TA   TOTAL KNEE ARTHROPLASTY Left 08/13/2022   Procedure: TOTAL KNEE ARTHROPLASTY;  Surgeon: Jene Every, MD;  Location: [facility];  Service: Orthopedics;  Laterality: Left;   WRIST SURGERY  1984   right   Patient Active Problem List   Diagnosis Date Noted   PVC (premature ventricular contraction) 08/18/2022   Rash of back 08/18/2022   Sepsis (HCC) 08/17/2022   Hypokalemia 08/17/2022   Fever postop 08/16/2022   Hyponatremia 08/16/2022   SOB (shortness of breath) 08/16/2022   S/P TKR (total knee replacement) using cement, left 08/13/2022   Cardiomegaly 06/23/2022   Hepatic steatosis 06/23/2022   Abdominal bloating 06/02/2022   Leg pain 02/18/2022   Injury of right  shoulder 02/17/2022   Neuropathy associated with endocrine disorder (HCC) 02/17/2022   Need for influenza vaccination 02/17/2022   Prediabetes 12/03/2021   Bilateral carpal tunnel syndrome 10/01/2021   Abnormal urinalysis 06/10/2021   Receptive aphasia 05/30/2021   Memory loss 05/30/2021   Cough due to ACE inhibitor 08/27/2020   Cough 08/03/2020   Lower abdominal pain 08/03/2020   Benign prostatic hyperplasia with post-void dribbling 08/03/2020   Paresthesias 08/03/2020   Anemia 08/03/2020   Alcohol use 07/22/2019   Primary hypertension 07/22/2019   Basal cell carcinoma (BCC) in situ of skin 05/14/2017   Sinus bradycardia 03/15/2015   Coronary artery disease involving native coronary artery of native heart without angina pectoris 03/15/2015   PCP NOTES >>> 02/21/2015   Hyperlipidemia with target LDL less than 70 06/08/2013   Palpitations 06/08/2013   DJD (degenerative joint disease) 03/16/2013   Healthcare maintenance 03/16/2013   Anxiety and depression 10/25/2012   Lipoma 03/20/2011   DM2 (diabetes mellitus, type 2) (HCC) 07/10/2009   ERECTILE DYSFUNCTION 02/21/2008   CORONARY ARTERY DISEASE 02/21/2008   GERD 02/21/2008    PCP: Vonna Kotyk,   REFERRING PROVIDER: Jene Every, MD  REFERRING  DIAG: s/p L TKA  THERAPY DIAG:  No diagnosis found.  Rationale for Evaluation and Treatment: Rehabilitation  ONSET DATE: 08/13/22  SUBJECTIVE:  SUBJECTIVE STATEMENT: 09/26/22: responded well to last PT session, is aware that he needs to better straighten his L knee, having some hamstring tightness/ pain still  Eval: I had the knee surgery, but unfortunately I have developed some new cardiac Sx.  To see cardiologist at beginning of next week.  My blood pressure fluctuates between 190's systolic and 90's diastolic, then will drop to 90 /40.  I have several episodes a day in which my heart races and I feel short of breath, and I almost pass out.  Happened in the lobby again today .   Have worn a monitor and hope to have some medication changes and /or clarity about my heart on Monday.  Also twisted my L knee in bed a couple of nights ago so it is really sore/tender med and lateral knee.  PERTINENT HISTORY: Underwent L TKA 08/13/22, prior to that time underwent  LS surgery due to L lumbar radiculopathy PAIN:  Are you having pain? Yes: NPRS scale: 2/10 Pain location: L knee Pain description: consistent Aggravating factors: twisted in bed, any twisting motion L knee Relieving factors: meds, rest  PRECAUTIONS: Other: new cardiac/postural hypotensive issues since this surgery  WEIGHT BEARING RESTRICTIONS: No  FALLS:  Has patient fallen in last 6 months? Yes. Number of falls 1  LIVING ENVIRONMENT: Lives with: lives with their spouse Lives in: House/apartment Stairs: Yes: Internal: 11 steps; on right going up Has following equipment at home: Single point cane, r walker  OCCUPATION: desk job, on computer  PLOF: Independent  PATIENT GOALS: return to pain free walking, able to travel  NEXT MD VISIT: unclear  OBJECTIVE:   DIAGNOSTIC FINDINGS: na  PATIENT SURVEYS:  LEFS 21/80, 26%  COGNITION: Overall cognitive status: Within functional limits for tasks assessed     SENSATION: WFL  EDEMA: mild L ant knee   POSTURE:  habitually flexed L knee  PALPATION: Point tender lat tibia attachments of TFL L Normal patellar mobility  LOWER EXTREMITY ROM:  Active ROM Right eval Left eval L 10/01/22  Hip flexion     Hip extension     Hip abduction     Hip adduction     Hip internal rotation     Hip external rotation     Knee flexion  103 106  Knee extension  -13   Ankle dorsiflexion     Ankle plantarflexion     Ankle inversion     Ankle eversion      All testing wfl unless otherwise noted  LOWER EXTREMITY MMT:  MMT Right eval Left eval  Hip flexion  4  Hip extension    Hip abduction    Hip adduction    Hip internal rotation    Hip external  rotation    Knee flexion  4-  Knee extension  4-  Ankle dorsiflexion    Ankle plantarflexion  4/5  Ankle inversion    Ankle eversion       FUNCTIONAL TESTS: not performed today due to fluctuating hypotension, including episode in lobby today   GAIT: Distance walked: 82' in clinic Assistive device utilized: Single point cane Level of assistance: Modified independence Comments: L knee flexed , decreased stance time L and decreased step length R   TODAY'S TREATMENT:  DATEMarlane Mingle Adult PT Treatment:                                                DATE:  10/01/22: therex and manual therapy to improve efficiency and function of L LE/knee Scifit 5 min, 1.2 Prone for massage gun L hamstrings, proximally  Prone TKE's, 5 sec holds, 15 reps Supine for Gr2-3 tibial AP glides with tibial abduction to tolerance  Gait with emphasis on L heel strike and elongating R  3 laps at intervals between exercises Standing for B toe raises and heel drops forefeet on black bar Leg press, 20 #, B knee squats 2 sets 10 B toe raises 20# 2 sets 10 L LE leg press. 20#, 2 sets 10  Vasopneumatic: Game ready at end of session, 10 min at 34 degrees to ease edema and post ex soreness/pain, with L LE elevated above heart    09/26/22 Therapeutic Exercise: NuStep BLE only L5 x6 min Holding posterior thigh, flex/ext x3" each x12 3# LAQ 3" hold, 3" eccentric x15 Calf Raises with light BUE support x15 with quad squeeze Eccentric STS to low table with focus on knee extension during concentric x15 Manual Therapy: Tibial block with block and grade 3 AP femur mobs into extension (to tolerance) End range knee flexion with tib on femur mobs for knee extension grade 3-4 PROM knee extension and flexion Patellar glides multi-directional Gait: 3# weight and no weight, 3 large laps, focusing on heel  strike and push-off with quad squeeze during swing phase  Modalities: Vasopneumatic: Game ready at end of session, 10 min at 34 degrees, medium pressure, to ease edema and post ex soreness/pain, with L LE elevated above heart   09/24/22:  Nustep level 5 Le's only 6 min Prone for massage gun L hamstrings Prone TKE's ankles over bolster 5 sec holds, 10 re[s Prone L knee flexion stretch, with contract/ relax , overpressure provided by PT LAQ 3# hold 3 sec 2 sets 10 In ll bars for lateral heel taps with R leg from 2 " step to isolate distal quads L In ll bars for heel to rocks, patient needed frequent UE support to avoid loss of balance posterior In ll bars slides R LE cw, ccw with R LE weight bearing. Mass practice.  Toe raises, plantarflexor stretch B ankles, forefeet on black bar  Leg press, B 30#, 2 sets 10,  Leg press, ankle pumps 30#, 2 sets 10  Vasopneumatic: Game ready at end of session, 10 min at 34 degrees to ease edema and post ex soreness/pain, with L LE elevated above heart 09/19/22 Nustep L 5 Gtband HS curls 2 sets 10 Gtband TKE sitting 2 sets 10 hold 3 sec Ext stretching AROM sitting 7-110 LAQ 3# hold 3 sec 2 sets 10 Leg Press 2 sets 10 30#, then RT leg only unlocked for ROM Calf Raises 30# 2 sets 15 Educ on elevation for swelling above heart 2 x a day VASO for swelling    09/17/22 : Therapeutic exercise: instructed in the following to engage B LE strength, flexibility L knee. Again emphasized achieving L knee extension flexibility and strength  Nustep level 3 Ue's and LE's 6 min  L knee flexion sitting : 110, L knee extension supine with overpressure: -10 Supine L hamstring stretch, therapist assisted , applying overpressure L post knee capsule  Prone for cross friction massage L hamstring Prone TKE's 5 sec holds, 10 reps Standing forefeet on metal bar, B heel raises and calf stretch, mass practice Kinesiotaping L ant knee distal attachment L tibial tubercle,  proximal attachment L quads, surrounding patella for tracking support, proprioception.     09/10/22  Therapeutic exercise:  Instructed patient in the following exercises to add at home: see HEP described at home.  Also inst in seated L knee  post capsule stretch with L foot on stool, ankle below knee and knee below hip.  Did not utilized any upright activities or bicycle, Nustep due to cardiac Sx. Also kinesiotape, 2 I Pieces extending from tibial tubercle to mid thigh, surrounding patella, to provide patient with more stability, support for sleep, rolling in bed  PATIENT EDUCATION:  Education details: POC, GOALS, expectations for outcomes Person educated: Patient Education method: Medical illustrator Education comprehension: verbalized understanding, returned demonstration, and verbal cues required  HOME EXERCISE PROGRAM: Access Code: [email or web address] URL: [email or web address] Date: 09/10/2022 Prepared by: Linton Rump Moriya Mitchell  Exercises - Supine Quad Set on Towel Roll  - 1 x daily - 7 x weekly - 3 sets - 10 reps - Supine Calf Stretch with Strap  - 1 x daily - 7 x weekly - 3 sets - 10 reps - Long Sitting Ankle Plantar Flexion with Resistance  - 1 x daily - 7 x weekly - 3 sets - 10 reps  ASSESSMENT:  CLINICAL IMPRESSION: Pt still progressing well, overall better gait pattern.  Needs further instruction in gait mechanics.  Still limited extension ROM L He will continue to benefit from further skilled PT to restore ROM, strength, and normalize gait.  OBJECTIVE IMPAIRMENTS: cardiopulmonary status limiting activity, decreased activity tolerance, decreased mobility, difficulty walking, decreased ROM, decreased strength, increased edema, impaired flexibility, and pain.   ACTIVITY LIMITATIONS: carrying, lifting, bending, standing, squatting, sleeping, stairs, transfers, bed mobility, and locomotion level  PARTICIPATION LIMITATIONS: cleaning, laundry, shopping, community activity,  occupation, and yard work  PERSONAL FACTORS: Age, Past/current experiences, Time since onset of injury/illness/exacerbation, and 1-2 comorbidities: coronary heard disease, recent L lumbar fusion  are also affecting patient's functional outcome.   REHAB POTENTIAL: Good  CLINICAL DECISION MAKING: Stable/uncomplicated  EVALUATION COMPLEXITY: Low   GOALS: Goals reviewed with patient? Yes  SHORT TERM GOALS: Target date: 2 weeks 09/24/22  I HEP Baseline:established at eval Goal status: IN PROGRESS   LONG TERM GOALS: Target date: 11/05/22  Improve L knee flexibility to -5 extension to 115 flexion Baseline: -13 to 102 Goal status: IN PROGRESS  2.  FGA 28/30 Baseline: not assessed initially due to cardiac Sx Goal status: INITIAL  3.  LEFS improve from 21/80 to 40/80 Baseline: 21/80 Goal status: INITIAL  4.  Strength L quads, hamstrings, hip all planes 5/5 for normal transfers, gait over even and uneven terrain Baseline: 4-/5 Goal status: IN PROGRESS    PLAN:  PT FREQUENCY: 2x/week  PT DURATION: 8 weeks  PLANNED INTERVENTIONS: Therapeutic exercises, Therapeutic activity, Neuromuscular re-education, Balance training, Gait training, Patient/Family education, Self Care, and Joint mobilization  PLAN FOR NEXT SESSION: strengthening and gym activities for L knee, manual techniques, stretch L post knee    Junnie Loschiavo L Khambrel Amsden, PT 10/01/2022, 11:56 AM  [facility] at [facility] [address] [facility]. [place], [place], [address] Phone: [phone]   Fax:  [phone]

## 2022-10-02 ENCOUNTER — Encounter: Payer: Medicare Other | Admitting: Family Medicine

## 2022-10-03 ENCOUNTER — Encounter: Payer: Self-pay | Admitting: Physical Therapy

## 2022-10-03 ENCOUNTER — Ambulatory Visit: Payer: Medicare Other | Admitting: Physical Therapy

## 2022-10-03 DIAGNOSIS — M25662 Stiffness of left knee, not elsewhere classified: Secondary | ICD-10-CM | POA: Diagnosis not present

## 2022-10-03 DIAGNOSIS — R29898 Other symptoms and signs involving the musculoskeletal system: Secondary | ICD-10-CM | POA: Diagnosis not present

## 2022-10-03 DIAGNOSIS — R262 Difficulty in walking, not elsewhere classified: Secondary | ICD-10-CM

## 2022-10-03 DIAGNOSIS — Z9889 Other specified postprocedural states: Secondary | ICD-10-CM | POA: Diagnosis not present

## 2022-10-03 DIAGNOSIS — G8929 Other chronic pain: Secondary | ICD-10-CM | POA: Diagnosis not present

## 2022-10-03 DIAGNOSIS — M25562 Pain in left knee: Secondary | ICD-10-CM | POA: Diagnosis not present

## 2022-10-03 DIAGNOSIS — Z96659 Presence of unspecified artificial knee joint: Secondary | ICD-10-CM | POA: Diagnosis not present

## 2022-10-03 NOTE — Therapy (Signed)
OUTPATIENT PHYSICAL THERAPY LOWER EXTREMITY TREATMENT   Patient Name: Dakota Gibbs MRN: [id number] DOB:12-28-1950, 72 y.o., male Today's Date: 10/03/2022  END OF SESSION:  PT End of Session - 10/03/22 1028     Visit Number 7    Date for PT Re-Evaluation 11/05/22    PT Start Time 1015    PT Stop Time 1100    PT Time Calculation (min) 45 min    Activity Tolerance Patient tolerated treatment well    Behavior During Therapy [facility] for tasks assessed/performed                 Past Medical History:  Diagnosis Date   Allergy    enviornmental   Anxiety    Anxiety and depression 10/25/2012   Arthritis    self dx   Back pain    L4-L5 bulging disc, L5-S1 - bulging disc, pinched nerve in neck   Cataract    bilateral sx   Coronary artery disease    hx of balloon angioplasty with DR Bishop Limbo   Depression    Diabetes mellitus    pt denies   History of shingles 2009   Hyperlipidemia    on meds   Hypertension    on meds   Myocardial infarction (HCC) 1997   caused by a blood clot   Sleep apnea    cpap   Past Surgical History:  Procedure Laterality Date   ANGIOPLASTY  1997   BACK SURGERY  04/02/2022   basel cell     skin carcinoma removed x 4   BILATERAL CARPAL TUNNEL RELEASE     CARDIAC CATHETERIZATION     10/04/1999   COLONOSCOPY  2018   SA-MAC-suprep(good)-TA   DOPPLER ECHOCARDIOGRAPHY  07/09/2006   EF 50 to 55 %, LA mildy dilated   HERNIA REPAIR  1951   RIGHT   KNEE ARTHROSCOPY Right 2001   R   NM MYOCAR PERF WALL MOTION  08/22/2011   Mets 13,low risk study   POLYPECTOMY  2018   TA   TOTAL KNEE ARTHROPLASTY Left 08/13/2022   Procedure: TOTAL KNEE ARTHROPLASTY;  Surgeon: Jene Every, MD;  Location: [facility];  Service: Orthopedics;  Laterality: Left;   WRIST SURGERY  1984   right   Patient Active Problem List   Diagnosis Date Noted   PVC (premature ventricular contraction) 08/18/2022   Rash of back 08/18/2022   Sepsis (HCC) 08/17/2022   Hypokalemia  08/17/2022   Fever postop 08/16/2022   Hyponatremia 08/16/2022   SOB (shortness of breath) 08/16/2022   S/P TKR (total knee replacement) using cement, left 08/13/2022   Cardiomegaly 06/23/2022   Hepatic steatosis 06/23/2022   Abdominal bloating 06/02/2022   Leg pain 02/18/2022   Injury of right shoulder 02/17/2022   Neuropathy associated with endocrine disorder (HCC) 02/17/2022   Need for influenza vaccination 02/17/2022   Prediabetes 12/03/2021   Bilateral carpal tunnel syndrome 10/01/2021   Abnormal urinalysis 06/10/2021   Receptive aphasia 05/30/2021   Memory loss 05/30/2021   Cough due to ACE inhibitor 08/27/2020   Cough 08/03/2020   Lower abdominal pain 08/03/2020   Benign prostatic hyperplasia with post-void dribbling 08/03/2020   Paresthesias 08/03/2020   Anemia 08/03/2020   Alcohol use 07/22/2019   Primary hypertension 07/22/2019   Basal cell carcinoma (BCC) in situ of skin 05/14/2017   Sinus bradycardia 03/15/2015   Coronary artery disease involving native coronary artery of native heart without angina pectoris 03/15/2015   PCP NOTES >>> 02/21/2015  Hyperlipidemia with target LDL less than 70 06/08/2013   Palpitations 06/08/2013   DJD (degenerative joint disease) 03/16/2013   Healthcare maintenance 03/16/2013   Anxiety and depression 10/25/2012   Lipoma 03/20/2011   DM2 (diabetes mellitus, type 2) (HCC) 07/10/2009   ERECTILE DYSFUNCTION 02/21/2008   CORONARY ARTERY DISEASE 02/21/2008   GERD 02/21/2008    PCP: Vonna Kotyk,   REFERRING PROVIDER: Jene Every, MD  REFERRING DIAG: s/p L TKA  THERAPY DIAG:  Difficulty in walking, not elsewhere classified  Chronic pain of left knee  Weakness of both legs  Stiffness of left knee, not elsewhere classified  Rationale for Evaluation and Treatment: Rehabilitation  ONSET DATE: 08/13/22  SUBJECTIVE:  SUBJECTIVE STATEMENT: 5Doing well just have not been able to test it much, still some difficulty going  down stairs step over step  Eval: I had the knee surgery, but unfortunately I have developed some new cardiac Sx.  To see cardiologist at beginning of next week.  My blood pressure fluctuates between 190's systolic and 90's diastolic, then will drop to 90 /40.  I have several episodes a day in which my heart races and I feel short of breath, and I almost pass out.  Happened in the lobby again today .  Have worn a monitor and hope to have some medication changes and /or clarity about my heart on Monday.  Also twisted my L knee in bed a couple of nights ago so it is really sore/tender med and lateral knee.  PERTINENT HISTORY: Underwent L TKA 08/13/22, prior to that time underwent  LS surgery due to L lumbar radiculopathy PAIN:  Are you having pain? Yes: NPRS scale: 2/10 Pain location: L knee Pain description: consistent Aggravating factors: twisted in bed, any twisting motion L knee Relieving factors: meds, rest  PRECAUTIONS: Other: new cardiac/postural hypotensive issues since this surgery  WEIGHT BEARING RESTRICTIONS: No  FALLS:  Has patient fallen in last 6 months? Yes. Number of falls 1  LIVING ENVIRONMENT: Lives with: lives with their spouse Lives in: House/apartment Stairs: Yes: Internal: 11 steps; on right going up Has following equipment at home: Single point cane, r walker  OCCUPATION: desk job, on computer  PLOF: Independent  PATIENT GOALS: return to pain free walking, able to travel  NEXT MD VISIT: unclear  OBJECTIVE:   DIAGNOSTIC FINDINGS: na  PATIENT SURVEYS:  LEFS 21/80, 26%  COGNITION: Overall cognitive status: Within functional limits for tasks assessed     SENSATION: WFL  EDEMA: mild L ant knee   POSTURE:  habitually flexed L knee  PALPATION: Point tender lat tibia attachments of TFL L Normal patellar mobility  LOWER EXTREMITY ROM:  Active ROM Right eval Left eval L 10/01/22  Hip flexion     Hip extension     Hip abduction     Hip adduction      Hip internal rotation     Hip external rotation     Knee flexion  103 106  Knee extension  -13   Ankle dorsiflexion     Ankle plantarflexion     Ankle inversion     Ankle eversion      All testing wfl unless otherwise noted  LOWER EXTREMITY MMT:  MMT Right eval Left eval  Hip flexion  4  Hip extension    Hip abduction    Hip adduction    Hip internal rotation    Hip external rotation    Knee flexion  4-  Knee extension  4-  Ankle dorsiflexion    Ankle plantarflexion  4/5  Ankle inversion    Ankle eversion       FUNCTIONAL TESTS: not performed today due to fluctuating hypotension, including episode in lobby today   GAIT: Distance walked: 70' in clinic Assistive device utilized: Single point cane Level of assistance: Modified independence Comments: L knee flexed , decreased stance time L and decreased step length R   TODAY'S TREATMENT:                                                                                                                              DATE: [facility] Adult PT Treatment:                                                DATE:  10/03/22 Gait around the building good past Practice golf swings about 8 times with a SPC Bike level 4 x 6 mintues Passive HS stretch, piriformis stretch, passive knee extension Calf stretch Resisted gait 40# all directions 40# leg press x10, left only 20# 2 x10, no weight both legs 2x10 going as low as he can go Side step on and off airex Marching on airex, got light headed, BP was 122/71  10/01/22: therex and manual therapy to improve efficiency and function of L LE/knee Scifit 5 min, 1.2 Prone for massage gun L hamstrings, proximally  Prone TKE's, 5 sec holds, 15 reps Supine for Gr2-3 tibial AP glides with tibial abduction to tolerance  Gait with emphasis on L heel strike and elongating R  3 laps at intervals between exercises Standing for B toe raises and heel drops forefeet on black bar Leg press, 20 #, B knee  squats 2 sets 10 B toe raises 20# 2 sets 10 L LE leg press. 20#, 2 sets 10  Vasopneumatic: Game ready at end of session, 10 min at 34 degrees to ease edema and post ex soreness/pain, with L LE elevated above heart    09/26/22 Therapeutic Exercise: NuStep BLE only L5 x6 min Holding posterior thigh, flex/ext x3" each x12 3# LAQ 3" hold, 3" eccentric x15 Calf Raises with light BUE support x15 with quad squeeze Eccentric STS to low table with focus on knee extension during concentric x15 Manual Therapy: Tibial block with block and grade 3 AP femur mobs into extension (to tolerance) End range knee flexion with tib on femur mobs for knee extension grade 3-4 PROM knee extension and flexion Patellar glides multi-directional Gait: 3# weight and no weight, 3 large laps, focusing on heel strike and push-off with quad squeeze during swing phase  Modalities: Vasopneumatic: Game ready at end of session, 10 min at 34 degrees, medium pressure, to ease edema and post ex soreness/pain, with L LE elevated above heart   09/24/22:  Nustep level 5 Le's only  6 min Prone for massage gun L hamstrings Prone TKE's ankles over bolster 5 sec holds, 10 re[s Prone L knee flexion stretch, with contract/ relax , overpressure provided by PT LAQ 3# hold 3 sec 2 sets 10 In ll bars for lateral heel taps with R leg from 2 " step to isolate distal quads L In ll bars for heel to rocks, patient needed frequent UE support to avoid loss of balance posterior In ll bars slides R LE cw, ccw with R LE weight bearing. Mass practice.  Toe raises, plantarflexor stretch B ankles, forefeet on black bar  Leg press, B 30#, 2 sets 10,  Leg press, ankle pumps 30#, 2 sets 10  Vasopneumatic: Game ready at end of session, 10 min at 34 degrees to ease edema and post ex soreness/pain, with L LE elevated above heart 09/19/22 Nustep L 5 Gtband HS curls 2 sets 10 Gtband TKE sitting 2 sets 10 hold 3 sec Ext stretching AROM  sitting 7-110 LAQ 3# hold 3 sec 2 sets 10 Leg Press 2 sets 10 30#, then RT leg only unlocked for ROM Calf Raises 30# 2 sets 15 Educ on elevation for swelling above heart 2 x a day VASO for swelling    09/17/22 : Therapeutic exercise: instructed in the following to engage B LE strength, flexibility L knee. Again emphasized achieving L knee extension flexibility and strength  Nustep level 3 Ue's and LE's 6 min  L knee flexion sitting : 110, L knee extension supine with overpressure: -10 Supine L hamstring stretch, therapist assisted , applying overpressure L post knee capsule Prone for cross friction massage L hamstring Prone TKE's 5 sec holds, 10 reps Standing forefeet on metal bar, B heel raises and calf stretch, mass practice Kinesiotaping L ant knee distal attachment L tibial tubercle, proximal attachment L quads, surrounding patella for tracking support, proprioception.     09/10/22  Therapeutic exercise:  Instructed patient in the following exercises to add at home: see HEP described at home.  Also inst in seated L knee  post capsule stretch with L foot on stool, ankle below knee and knee below hip.  Did not utilized any upright activities or bicycle, Nustep due to cardiac Sx. Also kinesiotape, 2 I Pieces extending from tibial tubercle to mid thigh, surrounding patella, to provide patient with more stability, support for sleep, rolling in bed  PATIENT EDUCATION:  Education details: POC, GOALS, expectations for outcomes Person educated: Patient Education method: Medical illustrator Education comprehension: verbalized understanding, returned demonstration, and verbal cues required  HOME EXERCISE PROGRAM: Access Code: [email or web address] URL: [email or web address] Date: 09/10/2022 Prepared by: Amy Speaks  Exercises - Supine Quad Set on Towel Roll  - 1 x daily - 7 x weekly - 3 sets - 10 reps - Supine Calf Stretch with Strap  - 1 x daily - 7 x weekly - 3 sets - 10  reps - Long Sitting Ankle Plantar Flexion with Resistance  - 1 x daily - 7 x weekly - 3 sets - 10 reps  ASSESSMENT:  CLINICAL IMPRESSION: added walking, golf swings, did single leg leg press and calf strength, he is very tight in the posterior knee and the calf and HS, has difficulty on stairs going down step over step, due to the extension limitation he tends to not get heel strike.  I added some balance activities and he struggled with this a little  OBJECTIVE IMPAIRMENTS: cardiopulmonary status limiting activity, decreased activity tolerance, decreased mobility, difficulty walking,  decreased ROM, decreased strength, increased edema, impaired flexibility, and pain.   ACTIVITY LIMITATIONS: carrying, lifting, bending, standing, squatting, sleeping, stairs, transfers, bed mobility, and locomotion level  PARTICIPATION LIMITATIONS: cleaning, laundry, shopping, community activity, occupation, and yard work  PERSONAL FACTORS: Age, Past/current experiences, Time since onset of injury/illness/exacerbation, and 1-2 comorbidities: coronary heard disease, recent L lumbar fusion  are also affecting patient's functional outcome.   REHAB POTENTIAL: Good  CLINICAL DECISION MAKING: Stable/uncomplicated  EVALUATION COMPLEXITY: Low   GOALS: Goals reviewed with patient? Yes  SHORT TERM GOALS: Target date: 2 weeks 09/24/22  I HEP Baseline:established at eval Goal status: met 10/03/22   LONG TERM GOALS: Target date: 11/05/22  Improve L knee flexibility to -5 extension to 115 flexion Baseline: -13 to 102 Goal status: IN PROGRESS  2.  FGA 28/30 Baseline: not assessed initially due to cardiac Sx Goal status: INITIAL  3.  LEFS improve from 21/80 to 40/80 Baseline: 21/80 Goal status: INITIAL  4.  Strength L quads, hamstrings, hip all planes 5/5 for normal transfers, gait over even and uneven terrain Baseline: 4-/5 Goal status: IN PROGRESS    PLAN:  PT FREQUENCY: 2x/week  PT DURATION: 8  weeks  PLANNED INTERVENTIONS: Therapeutic exercises, Therapeutic activity, Neuromuscular re-education, Balance training, Gait training, Patient/Family education, Self Care, and Joint mobilization  PLAN FOR NEXT SESSION: strengthening and gym activities for L knee, manual techniques, stretch L post knee    Jearld Lesch, PT 10/03/2022, 10:28 AM  [facility] [place], [place], [address] Phone: [phone]   Fax:  [phone]

## 2022-10-08 ENCOUNTER — Other Ambulatory Visit: Payer: Self-pay

## 2022-10-08 ENCOUNTER — Ambulatory Visit: Payer: Medicare Other | Attending: Orthopedic Surgery

## 2022-10-08 DIAGNOSIS — M25662 Stiffness of left knee, not elsewhere classified: Secondary | ICD-10-CM | POA: Diagnosis not present

## 2022-10-08 DIAGNOSIS — G8929 Other chronic pain: Secondary | ICD-10-CM | POA: Diagnosis not present

## 2022-10-08 DIAGNOSIS — Z9889 Other specified postprocedural states: Secondary | ICD-10-CM | POA: Insufficient documentation

## 2022-10-08 DIAGNOSIS — M25562 Pain in left knee: Secondary | ICD-10-CM | POA: Insufficient documentation

## 2022-10-08 DIAGNOSIS — R262 Difficulty in walking, not elsewhere classified: Secondary | ICD-10-CM | POA: Insufficient documentation

## 2022-10-08 DIAGNOSIS — R29898 Other symptoms and signs involving the musculoskeletal system: Secondary | ICD-10-CM | POA: Insufficient documentation

## 2022-10-08 DIAGNOSIS — Z96659 Presence of unspecified artificial knee joint: Secondary | ICD-10-CM | POA: Diagnosis not present

## 2022-10-08 NOTE — Therapy (Signed)
OUTPATIENT PHYSICAL THERAPY LOWER EXTREMITY TREATMENT   Patient Name: Dakota Gibbs MRN: [id number] DOB:12-13-1950, 72 y.o., male Today's Date: 10/08/2022  END OF SESSION:  PT End of Session - 10/08/22 0846     Visit Number 8    Date for PT Re-Evaluation 11/05/22    PT Start Time 0846    PT Stop Time 0930    PT Time Calculation (min) 44 min    Activity Tolerance Patient tolerated treatment well    Behavior During Therapy [facility] for tasks assessed/performed                  Past Medical History:  Diagnosis Date   Allergy    enviornmental   Anxiety    Anxiety and depression 10/25/2012   Arthritis    self dx   Back pain    L4-L5 bulging disc, L5-S1 - bulging disc, pinched nerve in neck   Cataract    bilateral sx   Coronary artery disease    hx of balloon angioplasty with DR Bishop Limbo   Depression    Diabetes mellitus    pt denies   History of shingles 2009   Hyperlipidemia    on meds   Hypertension    on meds   Myocardial infarction (HCC) 1997   caused by a blood clot   Sleep apnea    cpap   Past Surgical History:  Procedure Laterality Date   ANGIOPLASTY  1997   BACK SURGERY  04/02/2022   basel cell     skin carcinoma removed x 4   BILATERAL CARPAL TUNNEL RELEASE     CARDIAC CATHETERIZATION     10/04/1999   COLONOSCOPY  2018   SA-MAC-suprep(good)-TA   DOPPLER ECHOCARDIOGRAPHY  07/09/2006   EF 50 to 55 %, LA mildy dilated   HERNIA REPAIR  1951   RIGHT   KNEE ARTHROSCOPY Right 2001   R   NM MYOCAR PERF WALL MOTION  08/22/2011   Mets 13,low risk study   POLYPECTOMY  2018   TA   TOTAL KNEE ARTHROPLASTY Left 08/13/2022   Procedure: TOTAL KNEE ARTHROPLASTY;  Surgeon: Jene Every, MD;  Location: [facility];  Service: Orthopedics;  Laterality: Left;   WRIST SURGERY  1984   right   Patient Active Problem List   Diagnosis Date Noted   PVC (premature ventricular contraction) 08/18/2022   Rash of back 08/18/2022   Sepsis (HCC) 08/17/2022    Hypokalemia 08/17/2022   Fever postop 08/16/2022   Hyponatremia 08/16/2022   SOB (shortness of breath) 08/16/2022   S/P TKR (total knee replacement) using cement, left 08/13/2022   Cardiomegaly 06/23/2022   Hepatic steatosis 06/23/2022   Abdominal bloating 06/02/2022   Leg pain 02/18/2022   Injury of right shoulder 02/17/2022   Neuropathy associated with endocrine disorder (HCC) 02/17/2022   Need for influenza vaccination 02/17/2022   Prediabetes 12/03/2021   Bilateral carpal tunnel syndrome 10/01/2021   Abnormal urinalysis 06/10/2021   Receptive aphasia 05/30/2021   Memory loss 05/30/2021   Cough due to ACE inhibitor 08/27/2020   Cough 08/03/2020   Lower abdominal pain 08/03/2020   Benign prostatic hyperplasia with post-void dribbling 08/03/2020   Paresthesias 08/03/2020   Anemia 08/03/2020   Alcohol use 07/22/2019   Primary hypertension 07/22/2019   Basal cell carcinoma (BCC) in situ of skin 05/14/2017   Sinus bradycardia 03/15/2015   Coronary artery disease involving native coronary artery of native heart without angina pectoris 03/15/2015   PCP NOTES >>>  02/21/2015   Hyperlipidemia with target LDL less than 70 06/08/2013   Palpitations 06/08/2013   DJD (degenerative joint disease) 03/16/2013   Healthcare maintenance 03/16/2013   Anxiety and depression 10/25/2012   Lipoma 03/20/2011   DM2 (diabetes mellitus, type 2) (HCC) 07/10/2009   ERECTILE DYSFUNCTION 02/21/2008   CORONARY ARTERY DISEASE 02/21/2008   GERD 02/21/2008    PCP: Vonna Kotyk,   REFERRING PROVIDER: Jene Every, MD  REFERRING DIAG: s/p L TKA  THERAPY DIAG:  Difficulty in walking, not elsewhere classified  Chronic pain of left knee  Weakness of both legs  Stiffness of left knee, not elsewhere classified  Rationale for Evaluation and Treatment: Rehabilitation  ONSET DATE: 08/13/22  SUBJECTIVE:  SUBJECTIVE STATEMENT: 10/08/22: I think the hamstring is getting a little better. Havent had  any more episodes of dizziness/light headedness since the other day. Realizing I have to pay attention to my level of exertion.  Eval: I had the knee surgery, but unfortunately I have developed some new cardiac Sx.  To see cardiologist at beginning of next week.  My blood pressure fluctuates between 190's systolic and 90's diastolic, then will drop to 90 /40.  I have several episodes a day in which my heart races and I feel short of breath, and I almost pass out.  Happened in the lobby again today .  Have worn a monitor and hope to have some medication changes and /or clarity about my heart on Monday.  Also twisted my L knee in bed a couple of nights ago so it is really sore/tender med and lateral knee.  PERTINENT HISTORY: Underwent L TKA 08/13/22, prior to that time underwent  LS surgery due to L lumbar radiculopathy PAIN:  Are you having pain? Yes: NPRS scale: 2/10 Pain location: L knee Pain description: consistent Aggravating factors: twisted in bed, any twisting motion L knee Relieving factors: meds, rest  PRECAUTIONS: Other: new cardiac/postural hypotensive issues since this surgery  WEIGHT BEARING RESTRICTIONS: No  FALLS:  Has patient fallen in last 6 months? Yes. Number of falls 1  LIVING ENVIRONMENT: Lives with: lives with their spouse Lives in: House/apartment Stairs: Yes: Internal: 11 steps; on right going up Has following equipment at home: Single point cane, r walker  OCCUPATION: desk job, on computer  PLOF: Independent  PATIENT GOALS: return to pain free walking, able to travel  NEXT MD VISIT: unclear  OBJECTIVE:   DIAGNOSTIC FINDINGS: na  PATIENT SURVEYS:  LEFS 21/80, 26%  COGNITION: Overall cognitive status: Within functional limits for tasks assessed     SENSATION: WFL  EDEMA: mild L ant knee   POSTURE:  habitually flexed L knee  PALPATION: Point tender lat tibia attachments of TFL L Normal patellar mobility  LOWER EXTREMITY ROM:  Active ROM  Right eval Left eval L 10/01/22 10/08/22:  Hip flexion      Hip extension      Hip abduction      Hip adduction      Hip internal rotation      Hip external rotation      Knee flexion  103 106 126  Knee extension  -13  -10  Ankle dorsiflexion      Ankle plantarflexion      Ankle inversion      Ankle eversion       All testing wfl unless otherwise noted  LOWER EXTREMITY MMT:  MMT Right eval Left eval  Hip flexion  4  Hip extension    Hip  abduction    Hip adduction    Hip internal rotation    Hip external rotation    Knee flexion  4-  Knee extension  4-  Ankle dorsiflexion    Ankle plantarflexion  4/5  Ankle inversion    Ankle eversion       FUNCTIONAL TESTS: not performed today due to fluctuating hypotension, including episode in lobby today   GAIT: Distance walked: 30' in clinic Assistive device utilized: Single point cane Level of assistance: Modified independence Comments: L knee flexed , decreased stance time L and decreased step length R   TODAY'S TREATMENT:                                                                                                                              DATE:10/08/22: Nustep level 5, LE's only 5 min Heel raises B with drops for gastroc stretch at black bar, 10 x2 Supine for Gr2-3 tibial AP glides with tibial abduction to tolerance  45 s bouts, 3 reps Supine manual stretching L hamstrings, with patient performing ankle pumps 30 sec hold , 3 reps 40# leg press x10, left only 20# 2 x10 Knee extension L LE 5#, 10 x 3 Knee flexion L LE 20# 10 x 3 R Lateral heel taps from 4" step with B UE support, 10 x 2 Standing alt hip abd with 3# 10 x 3  [facility] Adult PT Treatment:                                                DATE:  10/03/22 Gait around the building good past Practice golf swings about 8 times with a SPC Bike level 4 x 6 mintues Passive HS stretch, piriformis stretch, passive knee extension Calf stretch Resisted gait 40# all  directions 40# leg press x10, left only 20# 2 x10, no weight both legs 2x10 going as low as he can go Side step on and off airex Marching on airex, got light headed, BP was 122/71  10/01/22: therex and manual therapy to improve efficiency and function of L LE/knee Scifit 5 min, 1.2 Prone for massage gun L hamstrings, proximally  Prone TKE's, 5 sec holds, 15 reps Supine for Gr2-3 tibial AP glides with tibial abduction to tolerance  Gait with emphasis on L heel strike and elongating R  3 laps at intervals between exercises Standing for B toe raises and heel drops forefeet on black bar Leg press, 20 #, B knee squats 2 sets 10 B toe raises 20# 2 sets 10 L LE leg press. 20#, 2 sets 10  Vasopneumatic: Game ready at end of session, 10 min at 34 degrees to ease edema and post ex soreness/pain, with L LE elevated above heart    09/26/22 Therapeutic Exercise: NuStep BLE only L5 x6 min Holding posterior thigh,  flex/ext x3" each x12 3# LAQ 3" hold, 3" eccentric x15 Calf Raises with light BUE support x15 with quad squeeze Eccentric STS to low table with focus on knee extension during concentric x15 Manual Therapy: Tibial block with block and grade 3 AP femur mobs into extension (to tolerance) End range knee flexion with tib on femur mobs for knee extension grade 3-4 PROM knee extension and flexion Patellar glides multi-directional Gait: 3# weight and no weight, 3 large laps, focusing on heel strike and push-off with quad squeeze during swing phase  Modalities: Vasopneumatic: Game ready at end of session, 10 min at 34 degrees, medium pressure, to ease edema and post ex soreness/pain, with L LE elevated above heart   09/24/22:  Nustep level 5 Le's only 6 min Prone for massage gun L hamstrings Prone TKE's ankles over bolster 5 sec holds, 10 re[s Prone L knee flexion stretch, with contract/ relax , overpressure provided by PT LAQ 3# hold 3 sec 2 sets 10 In ll bars for lateral heel taps with  R leg from 2 " step to isolate distal quads L In ll bars for heel to rocks, patient needed frequent UE support to avoid loss of balance posterior In ll bars slides R LE cw, ccw with R LE weight bearing. Mass practice.  Toe raises, plantarflexor stretch B ankles, forefeet on black bar  Leg press, B 30#, 2 sets 10,  Leg press, ankle pumps 30#, 2 sets 10  Vasopneumatic: Game ready at end of session, 10 min at 34 degrees to ease edema and post ex soreness/pain, with L LE elevated above heart 09/19/22 Nustep L 5 Gtband HS curls 2 sets 10 Gtband TKE sitting 2 sets 10 hold 3 sec Ext stretching AROM sitting 7-110 LAQ 3# hold 3 sec 2 sets 10 Leg Press 2 sets 10 30#, then RT leg only unlocked for ROM Calf Raises 30# 2 sets 15 Educ on elevation for swelling above heart 2 x a day VASO for swelling    09/17/22 : Therapeutic exercise: instructed in the following to engage B LE strength, flexibility L knee. Again emphasized achieving L knee extension flexibility and strength  Nustep level 3 Ue's and LE's 6 min  L knee flexion sitting : 110, L knee extension supine with overpressure: -10 Supine L hamstring stretch, therapist assisted , applying overpressure L post knee capsule Prone for cross friction massage L hamstring Prone TKE's 5 sec holds, 10 reps Standing forefeet on metal bar, B heel raises and calf stretch, mass practice Kinesiotaping L ant knee distal attachment L tibial tubercle, proximal attachment L quads, surrounding patella for tracking support, proprioception.     09/10/22  Therapeutic exercise:  Instructed patient in the following exercises to add at home: see HEP described at home.  Also inst in seated L knee  post capsule stretch with L foot on stool, ankle below knee and knee below hip.  Did not utilized any upright activities or bicycle, Nustep due to cardiac Sx. Also kinesiotape, 2 I Pieces extending from tibial tubercle to mid thigh, surrounding patella, to provide  patient with more stability, support for sleep, rolling in bed  PATIENT EDUCATION:  Education details: POC, GOALS, expectations for outcomes Person educated: Patient Education method: Medical illustrator Education comprehension: verbalized understanding, returned demonstration, and verbal cues required  HOME EXERCISE PROGRAM: Access Code: [email or web address] URL: [email or web address] Date: 09/10/2022 Prepared by: Caralee Ates  Exercises - Supine Quad Set on Towel Roll  - 1 x daily -  7 x weekly - 3 sets - 10 reps - Supine Calf Stretch with Strap  - 1 x daily - 7 x weekly - 3 sets - 10 reps - Long Sitting Ankle Plantar Flexion with Resistance  - 1 x daily - 7 x weekly - 3 sets - 10 reps  ASSESSMENT:  CLINICAL IMPRESSION: progressing well s/P L TKA now 7 weeks post op.  Gradual improvement in L knee extension ROM and subsequent improved gait pattern noted.  Able to increase strength demands as well.   OBJECTIVE IMPAIRMENTS: cardiopulmonary status limiting activity, decreased activity tolerance, decreased mobility, difficulty walking, decreased ROM, decreased strength, increased edema, impaired flexibility, and pain.   ACTIVITY LIMITATIONS: carrying, lifting, bending, standing, squatting, sleeping, stairs, transfers, bed mobility, and locomotion level  PARTICIPATION LIMITATIONS: cleaning, laundry, shopping, community activity, occupation, and yard work  PERSONAL FACTORS: Age, Past/current experiences, Time since onset of injury/illness/exacerbation, and 1-2 comorbidities: coronary heard disease, recent L lumbar fusion  are also affecting patient's functional outcome.   REHAB POTENTIAL: Good  CLINICAL DECISION MAKING: Stable/uncomplicated  EVALUATION COMPLEXITY: Low   GOALS: Goals reviewed with patient? Yes  SHORT TERM GOALS: Target date: 2 weeks 09/24/22  I HEP Baseline:established at eval Goal status: met 10/03/22   LONG TERM GOALS: Target date:  11/05/22  Improve L knee flexibility to -5 extension to 115 flexion Baseline: -13 to 102 Goal status: IN PROGRESS 10/08/22: -10 to 126  2.  FGA 28/30 Baseline: not assessed initially due to cardiac Sx Goal status: INITIAL  3.  LEFS improve from 21/80 to 40/80 Baseline: 21/80 Goal status: INITIAL  4.  Strength L quads, hamstrings, hip all planes 5/5 for normal transfers, gait over even and uneven terrain Baseline: 4-/5 Goal status: IN PROGRESS    PLAN:  PT FREQUENCY: 2x/week  PT DURATION: 8 weeks  PLANNED INTERVENTIONS: Therapeutic exercises, Therapeutic activity, Neuromuscular re-education, Balance training, Gait training, Patient/Family education, Self Care, and Joint mobilization  PLAN FOR NEXT SESSION: strengthening and gym activities for L knee, manual techniques, stretch L post knee    Valinda Fedie L Meara Wiechman, PT 10/08/2022, 9:44 AM  [facility] at [facility] [place], [place], [address] Phone: [phone]   Fax:  [phone]

## 2022-10-09 DIAGNOSIS — C44319 Basal cell carcinoma of skin of other parts of face: Secondary | ICD-10-CM | POA: Diagnosis not present

## 2022-10-09 DIAGNOSIS — Z85828 Personal history of other malignant neoplasm of skin: Secondary | ICD-10-CM | POA: Diagnosis not present

## 2022-10-09 DIAGNOSIS — C44212 Basal cell carcinoma of skin of right ear and external auricular canal: Secondary | ICD-10-CM | POA: Diagnosis not present

## 2022-10-09 DIAGNOSIS — L814 Other melanin hyperpigmentation: Secondary | ICD-10-CM | POA: Diagnosis not present

## 2022-10-09 DIAGNOSIS — L570 Actinic keratosis: Secondary | ICD-10-CM | POA: Diagnosis not present

## 2022-10-09 DIAGNOSIS — D225 Melanocytic nevi of trunk: Secondary | ICD-10-CM | POA: Diagnosis not present

## 2022-10-10 ENCOUNTER — Ambulatory Visit: Payer: Medicare Other | Admitting: Physical Therapy

## 2022-10-10 DIAGNOSIS — M25662 Stiffness of left knee, not elsewhere classified: Secondary | ICD-10-CM | POA: Diagnosis not present

## 2022-10-10 DIAGNOSIS — R29898 Other symptoms and signs involving the musculoskeletal system: Secondary | ICD-10-CM

## 2022-10-10 DIAGNOSIS — Z9889 Other specified postprocedural states: Secondary | ICD-10-CM | POA: Diagnosis not present

## 2022-10-10 DIAGNOSIS — R262 Difficulty in walking, not elsewhere classified: Secondary | ICD-10-CM | POA: Diagnosis not present

## 2022-10-10 DIAGNOSIS — Z96659 Presence of unspecified artificial knee joint: Secondary | ICD-10-CM | POA: Diagnosis not present

## 2022-10-10 DIAGNOSIS — M25562 Pain in left knee: Secondary | ICD-10-CM | POA: Diagnosis not present

## 2022-10-10 DIAGNOSIS — G8929 Other chronic pain: Secondary | ICD-10-CM | POA: Diagnosis not present

## 2022-10-10 NOTE — Therapy (Signed)
OUTPATIENT PHYSICAL THERAPY LOWER EXTREMITY TREATMENT   Patient Name: Dakota Gibbs MRN: [id number] DOB:1951-01-02, 72 y.o., male Today's Date: 10/10/2022  END OF SESSION:  PT End of Session - 10/10/22 0843     Visit Number 9    Date for PT Re-Evaluation 11/05/22    PT Start Time 0844    PT Stop Time 0930    PT Time Calculation (min) 46 min                  Past Medical History:  Diagnosis Date   Allergy    enviornmental   Anxiety    Anxiety and depression 10/25/2012   Arthritis    self dx   Back pain    L4-L5 bulging disc, L5-S1 - bulging disc, pinched nerve in neck   Cataract    bilateral sx   Coronary artery disease    hx of balloon angioplasty with DR Bishop Limbo   Depression    Diabetes mellitus    pt denies   History of shingles 2009   Hyperlipidemia    on meds   Hypertension    on meds   Myocardial infarction (HCC) 1997   caused by a blood clot   Sleep apnea    cpap   Past Surgical History:  Procedure Laterality Date   ANGIOPLASTY  1997   BACK SURGERY  04/02/2022   basel cell     skin carcinoma removed x 4   BILATERAL CARPAL TUNNEL RELEASE     CARDIAC CATHETERIZATION     10/04/1999   COLONOSCOPY  2018   SA-MAC-suprep(good)-TA   DOPPLER ECHOCARDIOGRAPHY  07/09/2006   EF 50 to 55 %, LA mildy dilated   HERNIA REPAIR  1951   RIGHT   KNEE ARTHROSCOPY Right 2001   R   NM MYOCAR PERF WALL MOTION  08/22/2011   Mets 13,low risk study   POLYPECTOMY  2018   TA   TOTAL KNEE ARTHROPLASTY Left 08/13/2022   Procedure: TOTAL KNEE ARTHROPLASTY;  Surgeon: Jene Every, MD;  Location: [facility];  Service: Orthopedics;  Laterality: Left;   WRIST SURGERY  1984   right   Patient Active Problem List   Diagnosis Date Noted   PVC (premature ventricular contraction) 08/18/2022   Rash of back 08/18/2022   Sepsis (HCC) 08/17/2022   Hypokalemia 08/17/2022   Fever postop 08/16/2022   Hyponatremia 08/16/2022   SOB (shortness of breath) 08/16/2022   S/P TKR  (total knee replacement) using cement, left 08/13/2022   Cardiomegaly 06/23/2022   Hepatic steatosis 06/23/2022   Abdominal bloating 06/02/2022   Leg pain 02/18/2022   Injury of right shoulder 02/17/2022   Neuropathy associated with endocrine disorder (HCC) 02/17/2022   Need for influenza vaccination 02/17/2022   Prediabetes 12/03/2021   Bilateral carpal tunnel syndrome 10/01/2021   Abnormal urinalysis 06/10/2021   Receptive aphasia 05/30/2021   Memory loss 05/30/2021   Cough due to ACE inhibitor 08/27/2020   Cough 08/03/2020   Lower abdominal pain 08/03/2020   Benign prostatic hyperplasia with post-void dribbling 08/03/2020   Paresthesias 08/03/2020   Anemia 08/03/2020   Alcohol use 07/22/2019   Primary hypertension 07/22/2019   Basal cell carcinoma (BCC) in situ of skin 05/14/2017   Sinus bradycardia 03/15/2015   Coronary artery disease involving native coronary artery of native heart without angina pectoris 03/15/2015   PCP NOTES >>> 02/21/2015   Hyperlipidemia with target LDL less than 70 06/08/2013   Palpitations 06/08/2013   DJD (degenerative  joint disease) 03/16/2013   Healthcare maintenance 03/16/2013   Anxiety and depression 10/25/2012   Lipoma 03/20/2011   DM2 (diabetes mellitus, type 2) (HCC) 07/10/2009   ERECTILE DYSFUNCTION 02/21/2008   CORONARY ARTERY DISEASE 02/21/2008   GERD 02/21/2008    PCP: Vonna Kotyk,   REFERRING PROVIDER: Jene Every, MD  REFERRING DIAG: s/p L TKA  THERAPY DIAG:  Difficulty in walking, not elsewhere classified  Weakness of both legs  Stiffness of left knee, not elsewhere classified  Rationale for Evaluation and Treatment: Rehabilitation  ONSET DATE: 08/13/22  SUBJECTIVE:  SUBJECTIVE STATEMENT:  doing pretty good    Eval: I had the knee surgery, but unfortunately I have developed some new cardiac Sx.  To see cardiologist at beginning of next week.  My blood pressure fluctuates between 190's systolic and 90's  diastolic, then will drop to 90 /40.  I have several episodes a day in which my heart races and I feel short of breath, and I almost pass out.  Happened in the lobby again today .  Have worn a monitor and hope to have some medication changes and /or clarity about my heart on Monday.  Also twisted my L knee in bed a couple of nights ago so it is really sore/tender med and lateral knee.  PERTINENT HISTORY: Underwent L TKA 08/13/22, prior to that time underwent  LS surgery due to L lumbar radiculopathy PAIN:  Are you having pain? Yes: NPRS scale: 2/10 Pain location: L knee Pain description: consistent Aggravating factors: twisted in bed, any twisting motion L knee Relieving factors: meds, rest  PRECAUTIONS: Other: new cardiac/postural hypotensive issues since this surgery  WEIGHT BEARING RESTRICTIONS: No  FALLS:  Has patient fallen in last 6 months? Yes. Number of falls 1  LIVING ENVIRONMENT: Lives with: lives with their spouse Lives in: House/apartment Stairs: Yes: Internal: 11 steps; on right going up Has following equipment at home: Single point cane, r walker  OCCUPATION: desk job, on computer  PLOF: Independent  PATIENT GOALS: return to pain free walking, able to travel  NEXT MD VISIT: unclear  OBJECTIVE:   DIAGNOSTIC FINDINGS: na  PATIENT SURVEYS:  LEFS 21/80, 26%  COGNITION: Overall cognitive status: Within functional limits for tasks assessed     SENSATION: WFL  EDEMA: mild L ant knee   POSTURE:  habitually flexed L knee  PALPATION: Point tender lat tibia attachments of TFL L Normal patellar mobility  LOWER EXTREMITY ROM:  Active ROM Right eval Left eval L 10/01/22 10/08/22:  Hip flexion      Hip extension      Hip abduction      Hip adduction      Hip internal rotation      Hip external rotation      Knee flexion  103 106 126  Knee extension  -13  -10  Ankle dorsiflexion      Ankle plantarflexion      Ankle inversion      Ankle eversion        All testing wfl unless otherwise noted  LOWER EXTREMITY MMT:  MMT Right eval Left eval Left 10/10/22  Hip flexion  4 4+  Hip extension     Hip abduction     Hip adduction     Hip internal rotation     Hip external rotation     Knee flexion  4- 4  Knee extension  4- 4  Ankle dorsiflexion     Ankle plantarflexion  4/5  Ankle inversion     Ankle eversion        FUNCTIONAL TESTS: not performed today due to fluctuating hypotension, including episode in lobby today   GAIT: Distance walked: 56' in clinic Assistive device utilized: Single point cane Level of assistance: Modified independence Comments: L knee flexed , decreased stance time L and decreased step length R   TODAY'S TREATMENT:                                                                                                                              DATE:  10/10/22 Nustep L 5 LE only 40# leg press x15, left only 20# 2 x10. Calf raises 40# 2 sets 10 4 inch plus airex step ups 10 x each leg 6 inch step up LLE only 10 x fwd and laterally Blue tband TKE standing 2 sets 10 Knee extension L LE 10# 2 sets 10 Knee flexion L LE 20# 10x, 25# 10 x 50# LLE cable press down 20 x AROM after session 8-     , passive ext 5    10/08/22: Nustep level 5, LE's only 5 min Heel raises B with drops for gastroc stretch at black bar, 10 x2 Supine for Gr2-3 tibial AP glides with tibial abduction to tolerance  45 s bouts, 3 reps Supine manual stretching L hamstrings, with patient performing ankle pumps 30 sec hold , 3 reps 40# leg press x10, left only 20# 2 x10 Knee extension L LE 5#, 10 x 3 Knee flexion L LE 20# 10 x 3 R Lateral heel taps from 4" step with B UE support, 10 x 2 Standing alt hip abd with 3# 10 x 3  [facility] Adult PT Treatment:                                                DATE:  10/03/22 Gait around the building good past Practice golf swings about 8 times with a SPC Bike level 4 x 6 mintues Passive HS stretch,  piriformis stretch, passive knee extension Calf stretch Resisted gait 40# all directions 40# leg press x10, left only 20# 2 x10, no weight both legs 2x10 going as low as he can go Side step on and off airex Marching on airex, got light headed, BP was 122/71  10/01/22: therex and manual therapy to improve efficiency and function of L LE/knee Scifit 5 min, 1.2 Prone for massage gun L hamstrings, proximally  Prone TKE's, 5 sec holds, 15 reps Supine for Gr2-3 tibial AP glides with tibial abduction to tolerance  Gait with emphasis on L heel strike and elongating R  3 laps at intervals between exercises Standing for B toe raises and heel drops forefeet on black bar Leg press, 20 #, B knee squats 2 sets 10 B toe raises 20#  2 sets 10 L LE leg press. 20#, 2 sets 10  Vasopneumatic: Game ready at end of session, 10 min at 34 degrees to ease edema and post ex soreness/pain, with L LE elevated above heart    09/26/22 Therapeutic Exercise: NuStep BLE only L5 x6 min Holding posterior thigh, flex/ext x3" each x12 3# LAQ 3" hold, 3" eccentric x15 Calf Raises with light BUE support x15 with quad squeeze Eccentric STS to low table with focus on knee extension during concentric x15 Manual Therapy: Tibial block with block and grade 3 AP femur mobs into extension (to tolerance) End range knee flexion with tib on femur mobs for knee extension grade 3-4 PROM knee extension and flexion Patellar glides multi-directional Gait: 3# weight and no weight, 3 large laps, focusing on heel strike and push-off with quad squeeze during swing phase  Modalities: Vasopneumatic: Game ready at end of session, 10 min at 34 degrees, medium pressure, to ease edema and post ex soreness/pain, with L LE elevated above heart   09/24/22:  Nustep level 5 Le's only 6 min Prone for massage gun L hamstrings Prone TKE's ankles over bolster 5 sec holds, 10 re[s Prone L knee flexion stretch, with contract/ relax , overpressure  provided by PT LAQ 3# hold 3 sec 2 sets 10 In ll bars for lateral heel taps with R leg from 2 " step to isolate distal quads L In ll bars for heel to rocks, patient needed frequent UE support to avoid loss of balance posterior In ll bars slides R LE cw, ccw with R LE weight bearing. Mass practice.  Toe raises, plantarflexor stretch B ankles, forefeet on black bar  Leg press, B 30#, 2 sets 10,  Leg press, ankle pumps 30#, 2 sets 10  Vasopneumatic: Game ready at end of session, 10 min at 34 degrees to ease edema and post ex soreness/pain, with L LE elevated above heart 09/19/22 Nustep L 5 Gtband HS curls 2 sets 10 Gtband TKE sitting 2 sets 10 hold 3 sec Ext stretching AROM sitting 7-110 LAQ 3# hold 3 sec 2 sets 10 Leg Press 2 sets 10 30#, then RT leg only unlocked for ROM Calf Raises 30# 2 sets 15 Educ on elevation for swelling above heart 2 x a day VASO for swelling    09/17/22 : Therapeutic exercise: instructed in the following to engage B LE strength, flexibility L knee. Again emphasized achieving L knee extension flexibility and strength  Nustep level 3 Ue's and LE's 6 min  L knee flexion sitting : 110, L knee extension supine with overpressure: -10 Supine L hamstring stretch, therapist assisted , applying overpressure L post knee capsule Prone for cross friction massage L hamstring Prone TKE's 5 sec holds, 10 reps Standing forefeet on metal bar, B heel raises and calf stretch, mass practice Kinesiotaping L ant knee distal attachment L tibial tubercle, proximal attachment L quads, surrounding patella for tracking support, proprioception.     09/10/22  Therapeutic exercise:  Instructed patient in the following exercises to add at home: see HEP described at home.  Also inst in seated L knee  post capsule stretch with L foot on stool, ankle below knee and knee below hip.  Did not utilized any upright activities or bicycle, Nustep due to cardiac Sx. Also kinesiotape, 2 I Pieces  extending from tibial tubercle to mid thigh, surrounding patella, to provide patient with more stability, support for sleep, rolling in bed  PATIENT EDUCATION:  Education details: POC,  GOALS, expectations for outcomes Person educated: Patient Education method: Explanation and Demonstration Education comprehension: verbalized understanding, returned demonstration, and verbal cues required  HOME EXERCISE PROGRAM: Access Code: [email or web address] URL: [email or web address] Date: 09/10/2022 Prepared by: Linton Rump Speaks  Exercises - Supine Quad Set on Towel Roll  - 1 x daily - 7 x weekly - 3 sets - 10 reps - Supine Calf Stretch with Strap  - 1 x daily - 7 x weekly - 3 sets - 10 reps - Long Sitting Ankle Plantar Flexion with Resistance  - 1 x daily - 7 x weekly - 3 sets - 10 reps  ASSESSMENT:  CLINICAL IMPRESSION: progressing LE strength, func with focus on ext ROM. Goals assessed OBJECTIVE IMPAIRMENTS: cardiopulmonary status limiting activity, decreased activity tolerance, decreased mobility, difficulty walking, decreased ROM, decreased strength, increased edema, impaired flexibility, and pain.   ACTIVITY LIMITATIONS: carrying, lifting, bending, standing, squatting, sleeping, stairs, transfers, bed mobility, and locomotion level  PARTICIPATION LIMITATIONS: cleaning, laundry, shopping, community activity, occupation, and yard work  PERSONAL FACTORS: Age, Past/current experiences, Time since onset of injury/illness/exacerbation, and 1-2 comorbidities: coronary heard disease, recent L lumbar fusion  are also affecting patient's functional outcome.   REHAB POTENTIAL: Good  CLINICAL DECISION MAKING: Stable/uncomplicated  EVALUATION COMPLEXITY: Low   GOALS: Goals reviewed with patient? Yes  SHORT TERM GOALS: Target date: 2 weeks 09/24/22  I HEP Baseline:established at eval Goal status: met 10/03/22   LONG TERM GOALS: Target date: 11/05/22  Improve L knee flexibility to -5 extension to  115 flexion Baseline: -13 to 102 Goal status: IN PROGRESS 10/08/22: -10 to 126  2.  FGA 28/30 Baseline: not assessed initially due to cardiac Sx Goal status: INITIAL  3.  LEFS improve from 21/80 to 40/80 Baseline: 21/80 Goal status: INITIAL  4.  Strength L quads, hamstrings, hip all planes 5/5 for normal transfers, gait over even and uneven terrain Baseline: 4-/5 Goal status: IN PROGRESS 4/5 10/10/22    PLAN:  PT FREQUENCY: 2x/week  PT DURATION: 8 weeks  PLANNED INTERVENTIONS: Therapeutic exercises, Therapeutic activity, Neuromuscular re-education, Balance training, Gait training, Patient/Family education, Self Care, and Joint mobilization  PLAN FOR NEXT SESSION: pt to try independently for next 1-2 weeks then f/u and if all good D/C   Dakota Gibbs,ANGIE, PTA 10/10/2022, 8:44 AM  [place] [facility] [address] [place], [place], [address] Phone: [phone]   Fax:  [phone] [address] [place], [place], [address] Phone: [phone]   Fax:  [phone]  Patient Details  Name: Dakota Gibbs MRN: [id number] Date of Birth: 09-22-1950 Referring Provider:  Mliss Sax,*  Encounter Date: 10/10/2022   Suanne Marker, PTA 10/10/2022, 8:44 AM  [place] [facility] at [facility] [address] [place], [place], [address] Phone: [phone]   Fax:  [phone]

## 2022-10-22 ENCOUNTER — Other Ambulatory Visit: Payer: Self-pay

## 2022-10-22 ENCOUNTER — Ambulatory Visit (INDEPENDENT_AMBULATORY_CARE_PROVIDER_SITE_OTHER): Payer: Medicare Other | Admitting: Family Medicine

## 2022-10-22 ENCOUNTER — Encounter: Payer: Self-pay | Admitting: Family Medicine

## 2022-10-22 ENCOUNTER — Ambulatory Visit: Payer: Medicare Other

## 2022-10-22 VITALS — BP 116/64 | HR 66 | Temp 97.8°F | Ht 69.0 in | Wt 192.4 lb

## 2022-10-22 DIAGNOSIS — E519 Thiamine deficiency, unspecified: Secondary | ICD-10-CM | POA: Insufficient documentation

## 2022-10-22 DIAGNOSIS — R262 Difficulty in walking, not elsewhere classified: Secondary | ICD-10-CM | POA: Diagnosis not present

## 2022-10-22 DIAGNOSIS — R29898 Other symptoms and signs involving the musculoskeletal system: Secondary | ICD-10-CM

## 2022-10-22 DIAGNOSIS — M25662 Stiffness of left knee, not elsewhere classified: Secondary | ICD-10-CM

## 2022-10-22 DIAGNOSIS — Z9889 Other specified postprocedural states: Secondary | ICD-10-CM

## 2022-10-22 DIAGNOSIS — M25562 Pain in left knee: Secondary | ICD-10-CM | POA: Diagnosis not present

## 2022-10-22 DIAGNOSIS — E785 Hyperlipidemia, unspecified: Secondary | ICD-10-CM

## 2022-10-22 DIAGNOSIS — E349 Endocrine disorder, unspecified: Secondary | ICD-10-CM | POA: Diagnosis not present

## 2022-10-22 DIAGNOSIS — D649 Anemia, unspecified: Secondary | ICD-10-CM | POA: Diagnosis not present

## 2022-10-22 DIAGNOSIS — G63 Polyneuropathy in diseases classified elsewhere: Secondary | ICD-10-CM | POA: Diagnosis not present

## 2022-10-22 DIAGNOSIS — G8929 Other chronic pain: Secondary | ICD-10-CM | POA: Diagnosis not present

## 2022-10-22 DIAGNOSIS — Z96659 Presence of unspecified artificial knee joint: Secondary | ICD-10-CM

## 2022-10-22 LAB — LIPID PANEL
Cholesterol: 95 mg/dL (ref 0–200)
HDL: 42.3 mg/dL (ref >39.00)
LDL Cholesterol: 25 mg/dL (ref 0–99)
NonHDL: 52.7
Total CHOL/HDL Ratio: 2
Triglycerides: 139 mg/dL (ref 0.0–149.0)
VLDL: 27.8 mg/dL (ref 0.0–40.0)

## 2022-10-22 LAB — EXTRA SPECIMEN

## 2022-10-22 LAB — VITAMIN B12: Vitamin B-12: 536 pg/mL (ref 211–911)

## 2022-10-22 NOTE — Progress Notes (Addendum)
Established Patient Office Visit   Subjective:  Patient ID: Dakota Gibbs, male    DOB: May 17, 1950  Age: 72 y.o. MRN: [id number]  Chief Complaint  Patient presents with   Annual Exam    CPE Pt is concern with numbness in bottom of feet at nightx 7 months. Pt is fasting.  Pt has DM2.     HPI Encounter Diagnoses  Name Primary?   Hyperlipidemia with target LDL less than 70 Yes   Neuropathy associated with endocrine disorder (HCC)    Thiamine deficiency    For follow-up of above.  Has done well status post left knee replacement earlier in the springtime.  Developed post operative hypotension and was hospitalized.  He is recovering nicely.  He is able to exercise.  For the last few years he has been experiencing numbness in the soles of both of his feet.  It happens after he has been walking and/or standing.  More recently he has noticed it in the nighttime.  No claudication pain.  Recent ABIs have been normal.  Back is doing okay.  Diabetes has been well-controlled.  TSH was normal this past year.  He stopped drinking 5 months ago.   Review of Systems  Constitutional: Negative.   HENT: Negative.    Eyes:  Negative for blurred vision, discharge and redness.  Respiratory: Negative.    Cardiovascular: Negative.   Gastrointestinal:  Negative for abdominal pain.  Genitourinary: Negative.   Musculoskeletal: Negative.  Negative for myalgias.  Skin:  Negative for rash.  Neurological:  Positive for tingling. Negative for loss of consciousness and weakness.  Endo/Heme/Allergies:  Negative for polydipsia.   Diabetic Foot Exam - Simple   Simple Foot Form Diabetic Foot exam was performed with the following findings: Yes 10/22/2022  9:55 AM  Visual Inspection No deformities, no ulcerations, no other skin breakdown bilaterally: Yes Sensation Testing Intact to touch and monofilament testing bilaterally: Yes Pulse Check Posterior Tibialis and Dorsalis pulse intact bilaterally:  Yes Comments      Current Outpatient Medications:    aspirin EC 81 MG tablet, Take 1 tablet (81 mg total) by mouth 2 (two) times daily after a meal. Day after surgery, Disp: 60 tablet, Rfl: 1   cetirizine (ZYRTEC) 10 MG tablet, Take 10 mg by mouth daily., Disp: , Rfl:    Cholecalciferol (VITAMIN D) 50 MCG (2000 UT) tablet, Take 2,000 Units by mouth daily., Disp: , Rfl:    citalopram (CELEXA) 20 MG tablet, ( GENERIC FOR CELEXA) TAKE ONE AND ONE-HALF TABLETS BY MOUTH DAILY, Disp: 135 tablet, Rfl: 1   cyanocobalamin (VITAMIN B12) 1000 MCG tablet, Take 1,000 mcg by mouth daily., Disp: , Rfl:    ezetimibe (ZETIA) 10 MG tablet, TAKE ONE TABLET BY MOUTH DAILY (Patient taking differently: Take 10 mg by mouth at bedtime.), Disp: 90 tablet, Rfl: 3   fluticasone (FLONASE) 50 MCG/ACT nasal spray, Place 1 spray into both nostrils daily., Disp: , Rfl:    metFORMIN (GLUCOPHAGE-XR) 500 MG 24 hr tablet, TAKE ONE TABLET BY MOUTH EVERY DAY WITH BREAKFAST, Disp: 90 tablet, Rfl: 1   Multiple Vitamin (MULTIVITAMIN) tablet, Take 1 tablet by mouth daily., Disp: , Rfl:    nitroGLYCERIN (NITROSTAT) 0.4 MG SL tablet, For chest pain, tightness, or pressure. While sitting, place 1 tablet under tongue. May be used every 5 minutes as needed, for up to 15 minutes. Do not use more than 3 tablets., Disp: 25 tablet, Rfl: 6   olmesartan (BENICAR) 40 MG tablet,  Take 1 tablet (40 mg total) by mouth at bedtime., Disp: 90 tablet, Rfl: 1   oxymetazoline (AFRIN) 0.05 % nasal spray, Place 1 spray into both nostrils at bedtime., Disp: , Rfl:    pyridOXINE (VITAMIN B6) 100 MG tablet, Take 100 mg by mouth daily., Disp: , Rfl:    tadalafil (CIALIS) 20 MG tablet, May take one or two up to every other day as needed for ed., Disp: 10 tablet, Rfl: 3   traMADol (ULTRAM) 50 MG tablet, Take 1-2 tablets by mouth every 6 (six) hours as needed for moderate pain or severe pain., Disp: , Rfl:    trolamine salicylate (ASPERCREME) 10 % cream, Apply 1  Application topically as needed for muscle pain., Disp: , Rfl:    vitamin C (ASCORBIC ACID) 500 MG tablet, Take 500 mg by mouth daily., Disp: , Rfl:    furosemide (LASIX) 20 MG tablet, Take 1 tablet (20 mg total) by mouth daily., Disp: 90 tablet, Rfl: 3   metoprolol tartrate (LOPRESSOR) 25 MG tablet, Take 0.5 tablets (12.5 mg total) by mouth 2 (two) times daily., Disp: 60 tablet, Rfl: 1   omeprazole (PRILOSEC OTC) 20 MG tablet, Take 20 mg by mouth at bedtime., Disp: , Rfl:    Objective:     BP 116/64   Pulse 66   Temp 97.8 F (36.6 C)   Ht 5\' 9"  (1.753 m)   Wt 192 lb 6.4 oz (87.3 kg)   BMI 28.41 kg/m    Physical Exam Constitutional:      General: He is not in acute distress.    Appearance: Normal appearance. He is not ill-appearing, toxic-appearing or diaphoretic.  HENT:     Head: Normocephalic and atraumatic.     Right Ear: External ear normal.     Left Ear: External ear normal.     Mouth/Throat:     Mouth: Mucous membranes are moist.     Pharynx: Oropharynx is clear. No oropharyngeal exudate or posterior oropharyngeal erythema.  Eyes:     General: No scleral icterus.       Right eye: No discharge.        Left eye: No discharge.     Extraocular Movements: Extraocular movements intact.     Conjunctiva/sclera: Conjunctivae normal.     Pupils: Pupils are equal, round, and reactive to light.  Cardiovascular:     Rate and Rhythm: Normal rate and regular rhythm.     Pulses:          Dorsalis pedis pulses are 1+ on the right side and 1+ on the left side.       Posterior tibial pulses are 1+ on the right side and 1+ on the left side.  Pulmonary:     Effort: Pulmonary effort is normal. No respiratory distress.     Breath sounds: Normal breath sounds.  Abdominal:     General: Bowel sounds are normal.  Musculoskeletal:     Cervical back: No rigidity or tenderness.     Lumbar back: No bony tenderness. Normal range of motion. Negative right straight leg raise test and negative  left straight leg raise test.  Skin:    General: Skin is warm and dry.  Neurological:     Mental Status: He is alert and oriented to person, place, and time.     Motor: No weakness.     Deep Tendon Reflexes:     Reflex Scores:      Patellar reflexes are 2+ on the right  side and 2+ on the left side.      Achilles reflexes are 1+ on the right side and 1+ on the left side. Psychiatric:        Mood and Affect: Mood normal.        Behavior: Behavior normal.      No results found for any visits on 10/22/22.    The 10-year ASCVD risk score (Arnett DK, et al., 2019) is: 31%    Assessment & Plan:   Hyperlipidemia with target LDL less than 70 -     Lipid panel  Neuropathy associated with endocrine disorder (HCC) -     Vitamin B12 -     Iron, TIBC and Ferritin Panel -     Ambulatory referral to Neurology  Thiamine deficiency -     Vitamin B1    Return in about 3 months (around 01/22/2023).    Mliss Sax, MD   6/24 addendum: Patient called out of concern for drop in iron levels.  Iron stores remain robust.  Recommend that he take a multivitamin with iron.  Hemoglobin has been stable.  MCV is elevated.  Recent urinalysis was negative for blood.  Next colonoscopy is due in 2026.

## 2022-10-22 NOTE — Therapy (Signed)
OUTPATIENT PHYSICAL THERAPY LOWER EXTREMITY DISCHARGE SUMMARY   Patient Name: Dakota Gibbs MRN: [id number] DOB:24-Mar-1951, 72 y.o., male Today's Date: 10/22/2022  END OF SESSION:  PT End of Session - 10/22/22 1300     Visit Number 10    Date for PT Re-Evaluation 11/05/22    PT Start Time 1300    PT Stop Time 1336    PT Time Calculation (min) 36 min    Activity Tolerance Patient tolerated treatment well    Behavior During Therapy [facility] for tasks assessed/performed                   Past Medical History:  Diagnosis Date   Allergy    enviornmental   Anxiety    Anxiety and depression 10/25/2012   Arthritis    self dx   Back pain    L4-L5 bulging disc, L5-S1 - bulging disc, pinched nerve in neck   Cataract    bilateral sx   Coronary artery disease    hx of balloon angioplasty with DR Bishop Limbo   Depression    Diabetes mellitus    pt denies   History of shingles 2009   Hyperlipidemia    on meds   Hypertension    on meds   Myocardial infarction (HCC) 1997   caused by a blood clot   Sleep apnea    cpap   Past Surgical History:  Procedure Laterality Date   ANGIOPLASTY  1997   BACK SURGERY  04/02/2022   basel cell     skin carcinoma removed x 4   BILATERAL CARPAL TUNNEL RELEASE     CARDIAC CATHETERIZATION     10/04/1999   COLONOSCOPY  2018   SA-MAC-suprep(good)-TA   DOPPLER ECHOCARDIOGRAPHY  07/09/2006   EF 50 to 55 %, LA mildy dilated   HERNIA REPAIR  1951   RIGHT   KNEE ARTHROSCOPY Right 2001   R   NM MYOCAR PERF WALL MOTION  08/22/2011   Mets 13,low risk study   POLYPECTOMY  2018   TA   TOTAL KNEE ARTHROPLASTY Left 08/13/2022   Procedure: TOTAL KNEE ARTHROPLASTY;  Surgeon: Jene Every, MD;  Location: [facility];  Service: Orthopedics;  Laterality: Left;   WRIST SURGERY  1984   right   Patient Active Problem List   Diagnosis Date Noted   Thiamine deficiency 10/22/2022   PVC (premature ventricular contraction) 08/18/2022   Rash of back  08/18/2022   Sepsis (HCC) 08/17/2022   Hypokalemia 08/17/2022   Fever postop 08/16/2022   Hyponatremia 08/16/2022   SOB (shortness of breath) 08/16/2022   S/P TKR (total knee replacement) using cement, left 08/13/2022   Cardiomegaly 06/23/2022   Hepatic steatosis 06/23/2022   Abdominal bloating 06/02/2022   Leg pain 02/18/2022   Injury of right shoulder 02/17/2022   Neuropathy associated with endocrine disorder (HCC) 02/17/2022   Need for influenza vaccination 02/17/2022   Prediabetes 12/03/2021   Bilateral carpal tunnel syndrome 10/01/2021   Abnormal urinalysis 06/10/2021   Receptive aphasia 05/30/2021   Memory loss 05/30/2021   Cough due to ACE inhibitor 08/27/2020   Cough 08/03/2020   Lower abdominal pain 08/03/2020   Benign prostatic hyperplasia with post-void dribbling 08/03/2020   Paresthesias 08/03/2020   Anemia 08/03/2020   Alcohol use 07/22/2019   Primary hypertension 07/22/2019   Basal cell carcinoma (BCC) in situ of skin 05/14/2017   Sinus bradycardia 03/15/2015   Coronary artery disease involving native coronary artery of native heart without angina  pectoris 03/15/2015   PCP NOTES >>> 02/21/2015   Hyperlipidemia with target LDL less than 70 06/08/2013   Palpitations 06/08/2013   DJD (degenerative joint disease) 03/16/2013   Healthcare maintenance 03/16/2013   Anxiety and depression 10/25/2012   Lipoma 03/20/2011   DM2 (diabetes mellitus, type 2) (HCC) 07/10/2009   ERECTILE DYSFUNCTION 02/21/2008   CORONARY ARTERY DISEASE 02/21/2008   GERD 02/21/2008    PCP: Vonna Kotyk,   REFERRING PROVIDER: Jene Every, MD  REFERRING DIAG: s/p L TKA  THERAPY DIAG:  Difficulty in walking, not elsewhere classified  Weakness of both legs  Stiffness of left knee, not elsewhere classified  Chronic pain of left knee  History of revision of total knee arthroplasty  History of lumbar laminectomy for spinal cord decompression  Rationale for Evaluation and  Treatment: Rehabilitation  ONSET DATE: 08/13/22  SUBJECTIVE:  SUBJECTIVE STATEMENT:  doing very well, walking over a mile at a time without problems.  I keep working on straightening my L knee.  I think I am ready to be done and take over my therex on my own    Eval: I had the knee surgery, but unfortunately I have developed some new cardiac Sx.  To see cardiologist at beginning of next week.  My blood pressure fluctuates between 190's systolic and 90's diastolic, then will drop to 90 /40.  I have several episodes a day in which my heart races and I feel short of breath, and I almost pass out.  Happened in the lobby again today .  Have worn a monitor and hope to have some medication changes and /or clarity about my heart on Monday.  Also twisted my L knee in bed a couple of nights ago so it is really sore/tender med and lateral knee.  PERTINENT HISTORY: Underwent L TKA 08/13/22, prior to that time underwent  LS surgery due to L lumbar radiculopathy PAIN:  Are you having pain? Yes: NPRS scale: 2/10 Pain location: L knee Pain description: consistent Aggravating factors: twisted in bed, any twisting motion L knee Relieving factors: meds, rest  PRECAUTIONS: Other: new cardiac/postural hypotensive issues since this surgery  WEIGHT BEARING RESTRICTIONS: No  FALLS:  Has patient fallen in last 6 months? Yes. Number of falls 1  LIVING ENVIRONMENT: Lives with: lives with their spouse Lives in: House/apartment Stairs: Yes: Internal: 11 steps; on right going up Has following equipment at home: Single point cane, r walker  OCCUPATION: desk job, on computer  PLOF: Independent  PATIENT GOALS: return to pain free walking, able to travel  NEXT MD VISIT: unclear  OBJECTIVE:   DIAGNOSTIC FINDINGS: na  PATIENT SURVEYS:  LEFS 21/80, 26%  COGNITION: Overall cognitive status: Within functional limits for tasks assessed     SENSATION: WFL  EDEMA: mild L ant knee   POSTURE:  habitually  flexed L knee  PALPATION: Point tender lat tibia attachments of TFL L Normal patellar mobility  LOWER EXTREMITY ROM:  Active ROM Right eval Left eval L 10/01/22 10/08/22: 10/22/22 L  Hip flexion       Hip extension       Hip abduction       Hip adduction       Hip internal rotation       Hip external rotation       Knee flexion  103 106 126 127  Knee extension  -13  -10 -6  Ankle dorsiflexion       Ankle plantarflexion  Ankle inversion       Ankle eversion        All testing wfl unless otherwise noted  LOWER EXTREMITY MMT:  MMT Right eval Left eval Left 10/10/22 10/22/22  Hip flexion  4 4+ 5  Hip extension      Hip abduction      Hip adduction      Hip internal rotation      Hip external rotation      Knee flexion  4- 4 5  Knee extension  4- 4 5  Ankle dorsiflexion      Ankle plantarflexion  4/5  5  Ankle inversion      Ankle eversion         FUNCTIONAL TESTS: not performed today due to fluctuating hypotension, including episode in lobby today   GAIT: Distance walked: 70' in clinic Assistive device utilized: Single point cane Level of assistance: Modified independence Comments: L knee flexed , decreased stance time L and decreased step length R   TODAY'S TREATMENT:                                                                                                                              DATE: 10/22/22:  Nustep level 5, LE's 5 min Assessed FGA,30/30 LEFS 76/80 Leg press 40#, B LE's, 10x LLE 20#, and deeper squat B LE's 20 x ea B heel raises with forefeet on black bar 20 reps Manual:  patient supine and therapist performed A/P glides tibial with add and abd force to isolate post capsule.   Supine for L hamstrings stretch with overpressure ant L knee jt capsule 30 sec holds, 5 reps  10/10/22 Nustep L 5 LE only 40# leg press x15, left only 20# 2 x10. Calf raises 40# 2 sets 10 4 inch plus airex step ups 10 x each leg 6 inch step up LLE only 10 x fwd  and laterally Blue tband TKE standing 2 sets 10 Knee extension L LE 10# 2 sets 10 Knee flexion L LE 20# 10x, 25# 10 x 50# LLE cable press down 20 x AROM after session 8-     , passive ext 5    10/08/22: Nustep level 5, LE's only 5 min Heel raises B with drops for gastroc stretch at black bar, 10 x2 Supine for Gr2-3 tibial AP glides with tibial abduction to tolerance  45 s bouts, 3 reps Supine manual stretching L hamstrings, with patient performing ankle pumps 30 sec hold , 3 reps 40# leg press x10, left only 20# 2 x10 Knee extension L LE 5#, 10 x 3 Knee flexion L LE 20# 10 x 3 R Lateral heel taps from 4" step with B UE support, 10 x 2 Standing alt hip abd with 3# 10 x 3  [facility] Adult PT Treatment:  DATE:  10/03/22 Gait around the building good past Practice golf swings about 8 times with a SPC Bike level 4 x 6 mintues Passive HS stretch, piriformis stretch, passive knee extension Calf stretch Resisted gait 40# all directions 40# leg press x10, left only 20# 2 x10, no weight both legs 2x10 going as low as he can go Side step on and off airex Marching on airex, got light headed, BP was 122/71  10/01/22: therex and manual therapy to improve efficiency and function of L LE/knee Scifit 5 min, 1.2 Prone for massage gun L hamstrings, proximally  Prone TKE's, 5 sec holds, 15 reps Supine for Gr2-3 tibial AP glides with tibial abduction to tolerance  Gait with emphasis on L heel strike and elongating R  3 laps at intervals between exercises Standing for B toe raises and heel drops forefeet on black bar Leg press, 20 #, B knee squats 2 sets 10 B toe raises 20# 2 sets 10 L LE leg press. 20#, 2 sets 10  Vasopneumatic: Game ready at end of session, 10 min at 34 degrees to ease edema and post ex soreness/pain, with L LE elevated above heart    09/26/22 Therapeutic Exercise: NuStep BLE only L5 x6 min Holding posterior thigh, flex/ext x3" each  x12 3# LAQ 3" hold, 3" eccentric x15 Calf Raises with light BUE support x15 with quad squeeze Eccentric STS to low table with focus on knee extension during concentric x15 Manual Therapy: Tibial block with block and grade 3 AP femur mobs into extension (to tolerance) End range knee flexion with tib on femur mobs for knee extension grade 3-4 PROM knee extension and flexion Patellar glides multi-directional Gait: 3# weight and no weight, 3 large laps, focusing on heel strike and push-off with quad squeeze during swing phase  Modalities: Vasopneumatic: Game ready at end of session, 10 min at 34 degrees, medium pressure, to ease edema and post ex soreness/pain, with L LE elevated above heart   09/24/22:  Nustep level 5 Le's only 6 min Prone for massage gun L hamstrings Prone TKE's ankles over bolster 5 sec holds, 10 re[s Prone L knee flexion stretch, with contract/ relax , overpressure provided by PT LAQ 3# hold 3 sec 2 sets 10 In ll bars for lateral heel taps with R leg from 2 " step to isolate distal quads L In ll bars for heel to rocks, patient needed frequent UE support to avoid loss of balance posterior In ll bars slides R LE cw, ccw with R LE weight bearing. Mass practice.  Toe raises, plantarflexor stretch B ankles, forefeet on black bar  Leg press, B 30#, 2 sets 10,  Leg press, ankle pumps 30#, 2 sets 10  Vasopneumatic: Game ready at end of session, 10 min at 34 degrees to ease edema and post ex soreness/pain, with L LE elevated above heart 09/19/22 Nustep L 5 Gtband HS curls 2 sets 10 Gtband TKE sitting 2 sets 10 hold 3 sec Ext stretching AROM sitting 7-110 LAQ 3# hold 3 sec 2 sets 10 Leg Press 2 sets 10 30#, then RT leg only unlocked for ROM Calf Raises 30# 2 sets 15 Educ on elevation for swelling above heart 2 x a day VASO for swelling    09/17/22 : Therapeutic exercise: instructed in the following to engage B LE strength, flexibility L knee. Again emphasized  achieving L knee extension flexibility and strength  Nustep level 3 Ue's and LE's 6 min  L knee flexion  sitting : 110, L knee extension supine with overpressure: -10 Supine L hamstring stretch, therapist assisted , applying overpressure L post knee capsule Prone for cross friction massage L hamstring Prone TKE's 5 sec holds, 10 reps Standing forefeet on metal bar, B heel raises and calf stretch, mass practice Kinesiotaping L ant knee distal attachment L tibial tubercle, proximal attachment L quads, surrounding patella for tracking support, proprioception.     09/10/22  Therapeutic exercise:  Instructed patient in the following exercises to add at home: see HEP described at home.  Also inst in seated L knee  post capsule stretch with L foot on stool, ankle below knee and knee below hip.  Did not utilized any upright activities or bicycle, Nustep due to cardiac Sx. Also kinesiotape, 2 I Pieces extending from tibial tubercle to mid thigh, surrounding patella, to provide patient with more stability, support for sleep, rolling in bed  PATIENT EDUCATION:  Education details: POC, GOALS, expectations for outcomes Person educated: Patient Education method: Medical illustrator Education comprehension: verbalized understanding, returned demonstration, and verbal cues required  HOME EXERCISE PROGRAM: Access Code: [email or web address] URL: [email or web address] Date: 09/10/2022 Prepared by: Domingos Riggi  Exercises - Supine Quad Set on Towel Roll  - 1 x daily - 7 x weekly - 3 sets - 10 reps - Supine Calf Stretch with Strap  - 1 x daily - 7 x weekly - 3 sets - 10 reps - Long Sitting Ankle Plantar Flexion with Resistance  - 1 x daily - 7 x weekly - 3 sets - 10 reps  ASSESSMENT:  CLINICAL IMPRESSION: Pt has met goals for PT , ready for discharge.  Much improved stamina as well as gait pattern and better L knee extension ROM which has improved his standing posture and walking efficiency.  DC  at this time. OBJECTIVE IMPAIRMENTS: cardiopulmonary status limiting activity, decreased activity tolerance, decreased mobility, difficulty walking, decreased ROM, decreased strength, increased edema, impaired flexibility, and pain.   ACTIVITY LIMITATIONS: carrying, lifting, bending, standing, squatting, sleeping, stairs, transfers, bed mobility, and locomotion level  PARTICIPATION LIMITATIONS: cleaning, laundry, shopping, community activity, occupation, and yard work  PERSONAL FACTORS: Age, Past/current experiences, Time since onset of injury/illness/exacerbation, and 1-2 comorbidities: coronary heard disease, recent L lumbar fusion  are also affecting patient's functional outcome.   REHAB POTENTIAL: Good  CLINICAL DECISION MAKING: Stable/uncomplicated  EVALUATION COMPLEXITY: Low   GOALS: Goals reviewed with patient? Yes  SHORT TERM GOALS: Target date: 2 weeks 09/24/22  I HEP Baseline:established at eval Goal status: met 10/03/22   LONG TERM GOALS: Target date: 11/05/22  Improve L knee flexibility to -5 extension to 115 flexion Baseline: -13 to 102 Goal status: IN PROGRESS 10/08/22: -10 to 126 10/22/22 -6 to 127, partially met   2.  FGA 28/30 Baseline: not assessed initially due to cardiac Sx Goal status: MET  3.  LEFS improve from 21/80 to 40/80 Baseline: 21/80 Goal status: MET 76/80 10/22/22  4.  Strength L quads, hamstrings, hip all planes 5/5 for normal transfers, gait over even and uneven terrain Baseline: 4-/5 Goal status: MET 4/5 10/10/22    PLAN:  PT FREQUENCY: 2x/week  PT DURATION: 8 weeks  PLANNED INTERVENTIONS: Therapeutic exercises, Therapeutic activity, Neuromuscular re-education, Balance training, Gait training, Patient/Family education, Self Care, and Joint mobilization  PLAN FOR NEXT SESSION: pt to try independently for next 1-2 weeks then f/u and if all good D/C   Fritzi Scripter L Ellon Marasco, PT 10/22/2022, 1:54 PM  [place] [facility] at [facility] [address] [place], [place], [address] Phone: [phone]   Fax:  [phone] at [facility] [address]. [place], [place], [address] Phone: [phone]   Fax:  [phone]  Patient Details  Name: JERRILL DIFRANCO MRN: [id number] Date of Birth: 23-Jun-1950 Referring Provider:  Jene Every, MD  Encounter Date: 10/22/2022   Early Chars, PT 10/22/2022, 1:54 PM  [facility]  [facility] at [facility] [address] [place], [place], [address] Phone: [phone]   Fax:  [phone]

## 2022-10-27 ENCOUNTER — Encounter: Payer: Self-pay | Admitting: Physician Assistant

## 2022-10-29 LAB — VITAMIN B1: Vitamin B1 (Thiamine): 26 nmol/L (ref 8–30)

## 2022-10-29 LAB — IRON,TIBC AND FERRITIN PANEL
%SAT: 18 % (calc) — ABNORMAL LOW (ref 20–48)
Ferritin: 190 ng/mL (ref 24–380)
Iron: 55 ug/dL (ref 50–180)
TIBC: 302 mcg/dL (calc) (ref 250–425)

## 2022-11-03 ENCOUNTER — Encounter: Payer: Self-pay | Admitting: Family Medicine

## 2022-11-09 NOTE — Progress Notes (Unsigned)
Cardiology Clinic Note   Date: 11/09/2022 ID: [name], DOB Dec 16, 1950, MRN [id number]  Primary Cardiologist:  Nicki Guadalajara, MD  Patient Profile    Dakota Gibbs is a 72 y.o. male who presents to the clinic today for ***    Past medical history significant for: CAD. LHC 05/10/1995 (STEMI): RCA 100%.  PLA 50 to 60%.  OM1 75%.  OM2 95%.  [name] 60%.  Diagonal 70%.  PTCA of RCA. Stage intervention January 1997: PCI to OM1 and OM 2. LHC 10/04/1999 (angina): No significant restenosis at prior coronary intervention sites including the RCA as well as OM1 and OM 2.  50% smooth narrowing D1.  50% smooth [name] narrowing.  Ostial OM 320% (unchanged).  Distal RCA with 50% narrowing being smooth at the crux of the origin at the PLA and 40% mid PLA. Nuclear stress test 09/03/2022: Low risk study with old mid-- basal inferior wall infarction, otherwise normal perfusion with no evidence of reversible ischemia.  Mildly reduced LV function due to inferior wall hypokinesis.  Unchanged from prior study 01/27/2019. Palpitations/PVCs. 14-day ZIO 09/09/2022: Preliminary report showed min HR 42 bpm, max HR 164 bpm, average HR 59 bpm.  Predominant underlying rhythm was sinus.  9 SVT runs, fastest 8 beats with max rate 164 bpm, longest 11.8 seconds average rate 113 bpm.  Junctional rhythm detected within +/- 45 seconds of triggered events.  Rare ectopy. Shortness of breath. Echo 08/16/2022: EF 70 to 75%.  Grade I DD.  RV systolic function hyperdynamic.  Severe LAE.  Moderate RAE.  Borderline dilatation of aortic root 39 mm.  No significant changes from prior study September 2021. Hypertension. Hyperlipidemia. Lipid panel 05/30/2021: LDL 47, HDL 60, TG 152, total 137. T2DM. GERD.     History of Present Illness    Dakota Gibbs is a longtime patient of cardiology followed by Dr. Tresa Endo for the above outlined history.   Patient was in the office by me on 08/27/2022 for hospital follow-up.  Patient was seen in  the ED on 08/15/2018 for 2 days after left knee replacement with complaints of shortness of breath and generalized weakness with an O2 sat of 88% on room air.  He was febrile.  Negative for PE and DVT.  Troponin negative x 2.  BNP elevated at 312.3.  EKG showed sinus rhythm with PVCs.  2-week ZIO was ordered to evaluate PVC burden.  When seen in the office patient continued to complain of increased fatigue and positional dizziness.  He had actually been experiencing the symptoms for a year but were reported at their worst at time of visit.  Patient reported dyspnea with a small amount of exertion followed by diaphoresis and fatigue.  Also reports orthopnea.  Symptoms felt to be anginal equivalent.  He was started on Lasix 40 mg but felt it was too much.  He was orthostatic and Lasix dose was decreased to 20 mg daily.  He was also hyperkalemic with potassium at 5.8 and had been instructed to stop K-Dur.  He denied palpitations, feeling of arrhythmia, or muscle weakness.  Was given a sample of Lokelma to help reduce potassium.  Repeat BMP showed potassium of 5.3.  Patient underwent nuclear stress test secondary to anginal symptoms which showed low risk study unchanged from prior study September 2020.   Patient contacted the office on 09/05/2022 with complaints of increased dizziness and presyncope: " Spoke with pt he states that since his appt with Dakota Gibbs his "spells" have been  getting worse from "sometimes to multiple times daiily.  he is having dizziness and feeling like he is going to passout.and his BP/HR has been over the place within minutes and with minimal exertion/rest. BP/HR has been ranging with activity/rest 193/95, 188'ish, 150/92, 125/78( hr 145 after 30 rest), 152/80 and so on.  Will discuss with DOD and see what is next. Discussed all this the BP/HR with DR Croitoru he states that pt has had PVC/palpitations in the past so have pt take 3 BP/HR in a row and average out these for a more accurate BP/HR.  Ok to increase Olmesartan 40mg  and then see how it goes. Document these averages and call back if it does not get any better. If symptoms increase /worsen go to the ER. Pt does have an appointment 5-15 with DW.  Pt informed of providers result & recommendations. Pt verbalized understanding. All questions, if any, were answered. He will keep a log of these numbers for review. He will let us know if symptoms persist/worsen. He will send a mychart message on Monday with update on these BP/HR, symptoms etc."   Contacted office on 09/08/2022: Per patient "When not stressed my BP is 165/85. When I get up, 80%of the time, my BP drops to 90/50. Then I am lightheaded, dizzy, short of breath, fatigue , and recently periodic chest pain, twice in last 3 days. I am very limited, pretty much, chair or bedridden. I feel that my condition is more serious than I am being treated for. I have to wait 10 more days in limbo until my next appointment."  Triage nurse spoke with patient:  Chest pain comes and goes.  Normally has none.  Taken nitroglycerin 3x in two years. This pain has gone away "so fast I didn't have the need it.  Has had it twice in 5 days, lasting a minute or so.  Pain is dead center of chest,  Does get SOB, and dizzy with lightheadedness  when getting up and doing anything. Checks BP when feeling this and sees that it is low,  90/58 this morning. These symptoms are same as last visit but the pain in center chest is new.  Advised if pain continues and not resolving as has in the past or has symptoms of nausea and vomiting, sweating, SOB with the  chest pain he should go to the ED. Patient states understanding. Moved office visit up two days sooner.   Patient was last seen in the office by me on 09/15/2022 after stopping Lasix per my instruction. He reported continued episodes of lightheadedness and dizziness with position changes and walking a short distance. He had been participating in PT with good tolerance. He  reported elevated BP of 170-180/85-95 in the morning. He was unsure if he increased Olmesartan. He was orthostatic but not hypotensive at the time of his visit. He was instructed to stop Metoprolol for 1-2 weeks. He will restart if symptoms unchanged. He was also instructed to wear compression socks and an abdominal binder.   Today, patient ***  Taking Metoprolol?     Orthostasis.  Patient *** CAD.  S/p PCI January 1997.  LHC June 2001 showed no significant restenosis of prior coronary intervention sites.  Nuclear stress testing May 2024 low risk study unchanged from September 2020.  Patient *** Continue aspirin, Zetia. Palpitations.  14-day ZIO May 2024 showed rare ectopy, 9 SVT runs, junctional rhythm +/- 45 seconds of triggered events.  Patient *** Hypertension.  BP today ***.  Patient *** Continue ***  ROS: All other systems reviewed and are otherwise negative except as noted in History of Present Illness.  Studies Reviewed       ***  Risk Assessment/Calculations    {Does this patient have ATRIAL FIBRILLATION?:[phone]} No BP recorded.  {Refresh Note OR Click here to enter BP  :1}***        Physical Exam    VS:  There were no vitals taken for this visit. , BMI There is no height or weight on file to calculate BMI.  GEN: Well nourished, well developed, in no acute distress. Neck: No JVD or carotid bruits. Cardiac: *** RRR. No murmurs. No rubs or gallops.   Respiratory:  Respirations regular and unlabored. Clear to auscultation without rales, wheezing or rhonchi. GI: Soft, nontender, nondistended. Extremities: Radials/DP/PT 2+ and equal bilaterally. No clubbing or cyanosis. No edema ***  Skin: Warm and dry, no rash. Neuro: Strength intact.  Assessment & Plan   ***  Disposition: ***     {Are you ordering a CV Procedure (e.g. stress test, cath, DCCV, TEE, etc)?   Press F2        :[id number]}   Signed, Etta Grandchild. Chiquetta Langner, DNP, NP-C

## 2022-11-12 ENCOUNTER — Encounter: Payer: Self-pay | Admitting: Student

## 2022-11-12 ENCOUNTER — Ambulatory Visit: Payer: Medicare Other | Attending: Student | Admitting: Student

## 2022-11-12 VITALS — BP 138/50 | HR 64 | Ht 70.0 in | Wt 196.0 lb

## 2022-11-12 DIAGNOSIS — I10 Essential (primary) hypertension: Secondary | ICD-10-CM

## 2022-11-12 DIAGNOSIS — R002 Palpitations: Secondary | ICD-10-CM | POA: Diagnosis not present

## 2022-11-12 DIAGNOSIS — I951 Orthostatic hypotension: Secondary | ICD-10-CM

## 2022-11-12 DIAGNOSIS — I2510 Atherosclerotic heart disease of native coronary artery without angina pectoris: Secondary | ICD-10-CM

## 2022-11-12 NOTE — Addendum Note (Signed)
Addended by: Luz Mares on: 11/12/2022 01:51 PM   Modules accepted: Level of Service  

## 2022-11-12 NOTE — Patient Instructions (Signed)
Medication Instructions:  Your physician recommends that you continue on your current medications as directed. Please refer to the Current Medication list given to you today.  *If you need a refill on your cardiac medications before your next appointment, please call your pharmacy*  Lab Work: NONE If you have labs (blood work) drawn today and your tests are completely normal, you will receive your results only by: MyChart Message (if you have MyChart) OR A paper copy in the mail If you have any lab test that is abnormal or we need to change your treatment, we will call you to review the results.  Testing/Procedures: NONE   Follow-Up: At [facility], you and your health needs are our priority.  As part of our continuing mission to provide you with exceptional heart care, we have created designated Provider Care Teams.  These Care Teams include your primary Cardiologist (physician) and Advanced Practice Providers (APPs -  Physician Assistants and Nurse Practitioners) who all work together to provide you with the care you need, when you need it.   Your next appointment:   KEEP SCHEDULED APPOINTMRNT WITH DR. Tresa Endo 02/02/2023 4:00PM. ARRIVE BY 3:45  Provider:   Nicki Guadalajara, MD  {If Card or EP not listed click to update   DO NOT delete brackets or number around this link

## 2022-11-17 ENCOUNTER — Encounter: Payer: Self-pay | Admitting: Family Medicine

## 2022-11-17 ENCOUNTER — Ambulatory Visit: Payer: Medicare Other | Admitting: Physician Assistant

## 2022-11-18 ENCOUNTER — Other Ambulatory Visit: Payer: Self-pay | Admitting: Family Medicine

## 2022-11-18 DIAGNOSIS — F419 Anxiety disorder, unspecified: Secondary | ICD-10-CM

## 2022-11-21 ENCOUNTER — Encounter: Payer: Self-pay | Admitting: Family Medicine

## 2022-11-21 DIAGNOSIS — F32A Depression, unspecified: Secondary | ICD-10-CM

## 2022-11-21 MED ORDER — CITALOPRAM HYDROBROMIDE 20 MG PO TABS
ORAL_TABLET | ORAL | 1 refills | Status: DC
Start: 2022-11-21 — End: 2023-04-13

## 2022-11-24 ENCOUNTER — Ambulatory Visit: Payer: Medicare Other | Admitting: Physician Assistant

## 2022-11-24 ENCOUNTER — Other Ambulatory Visit (INDEPENDENT_AMBULATORY_CARE_PROVIDER_SITE_OTHER): Payer: Medicare Other

## 2022-11-24 ENCOUNTER — Encounter: Payer: Self-pay | Admitting: Physician Assistant

## 2022-11-24 VITALS — BP 133/60 | HR 65 | Resp 18 | Ht 70.0 in | Wt 198.0 lb

## 2022-11-24 DIAGNOSIS — R202 Paresthesia of skin: Secondary | ICD-10-CM

## 2022-11-24 NOTE — Patient Instructions (Addendum)
Referral to Dr. Shon Baton NS, at [facility] for Lumbosacral pain and numbness  Referral to [facility] for neuropathy Neuropathy labs  Follow up in 2 months   Labs today [address]

## 2022-11-24 NOTE — Progress Notes (Signed)
Neurology clinic note  SERVICE DATE: 11/09/22  Reason for Evaluation:   Bilateral foot paresthesia   HPI: This is [name], a 72 y.o. R-handed male with a medical history of hypertension, hyperlipidemia, anxiety, diastolic heart failure, prior history of bradycardia, OSA on CPAP, diabetes, CAD status post MI, depression , prior alcohol and tobacco habituation,  who presents to neurology clinic with the chief complaint of bilateral leg paresthesia.      The patient has had similar episodes of symptoms in the past.  "3 years ago both feet were feeling very heavy and would not fell them if I bumped into something. ". "I fell in a line in October of last year and I was sent to [facility] because I needed a laminectomy of the L3-4 after needing to sit if I were standing longer than 20 minutes. After the surgery in Nov 2023 I could walk again  but recently the numbness came back . If I stand for 40 mins, it goes numb, I have to sit for a few mins and I am OK"-he says .  Recently he also experiences "stepping into something sharp, like a dry burning" if walking barefooted. L>R . If clipping the nails he may not feel the tip of the toes.   Muscle bulk loss? denies Muscle pain? no  Cramps/Twitching? no Able to get out of chair without using arms? yes   Use an assistive device to walk? No    Any change in urine color, especially after exertion/physical activity? no  The patient denies  diplopia, ptosis, dysphagia, poor saliva control, dysarthria/dysphonia, impaired mastication, facial weakness/droop.  There are no neuromuscular respiratory weakness symptoms, particularly orthopnea>dyspnea.   Denies  bowel or bladder dyscontrol  syncope/presyncope/orthostatic intolerance or saddle anesthesia.   Denies fever, night sweats, anorexia or unintentional weight loss.  ETOH use: has significantly reduced it to 1-2 drinks a week  Restrictive diet? diabetic   Current medications being  tried for the patient's symptoms include B12, B1 B3   Prior medications   gabapentin for a brief period    MEDICATIONS:  Outpatient Encounter Medications as of 11/24/2022  Medication Sig Note   aspirin EC 81 MG tablet Take 1 tablet (81 mg total) by mouth 2 (two) times daily after a meal. Day after surgery    cetirizine (ZYRTEC) 10 MG tablet Take 10 mg by mouth daily.    Cholecalciferol (VITAMIN D) 50 MCG (2000 UT) tablet Take 2,000 Units by mouth daily.    citalopram (CELEXA) 20 MG tablet ( GENERIC FOR CELEXA) TAKE ONE AND ONE-HALF TABLETS BY MOUTH DAILY    cyanocobalamin (VITAMIN B12) 1000 MCG tablet Take 1,000 mcg by mouth daily.    ezetimibe (ZETIA) 10 MG tablet TAKE ONE TABLET BY MOUTH DAILY (Patient taking differently: Take 10 mg by mouth at bedtime.)    fluticasone (FLONASE) 50 MCG/ACT nasal spray Place 1 spray into both nostrils daily.    metFORMIN (GLUCOPHAGE-XR) 500 MG 24 hr tablet TAKE ONE TABLET BY MOUTH EVERY DAY WITH BREAKFAST    Multiple Vitamin (MULTIVITAMIN) tablet Take 1 tablet by mouth daily.    olmesartan (BENICAR) 40 MG tablet Take 1 tablet (40 mg total) by mouth at bedtime.    oxymetazoline (AFRIN) 0.05 % nasal spray Place 1 spray into both nostrils at bedtime.    pyridOXINE (VITAMIN B6) 100 MG tablet Take 100 mg by mouth daily.    trolamine salicylate (ASPERCREME) 10 % cream Apply 1 Application topically  as needed for muscle pain.    vitamin C (ASCORBIC ACID) 500 MG tablet Take 500 mg by mouth daily.    nitroGLYCERIN (NITROSTAT) 0.4 MG SL tablet For chest pain, tightness, or pressure. While sitting, place 1 tablet under tongue. May be used every 5 minutes as needed, for up to 15 minutes. Do not use more than 3 tablets. (Patient not taking: Reported on 11/24/2022) 08/27/2022: Patient has if needed.   tadalafil (CIALIS) 20 MG tablet May take one or two up to every other day as needed for ed.    traMADol (ULTRAM) 50 MG tablet Take 1-2 tablets by mouth every 6 (six) hours as  needed for moderate pain or severe pain. (Patient not taking: Reported on 11/24/2022)    No facility-administered encounter medications on file as of 11/24/2022.    PAST MEDICAL HISTORY: Past Medical History:  Diagnosis Date   Allergy    enviornmental   Anxiety    Anxiety and depression 10/25/2012   Arthritis    self dx   Back pain    L4-L5 bulging disc, L5-S1 - bulging disc, pinched nerve in neck   Cataract    bilateral sx   Coronary artery disease    hx of balloon angioplasty with DR Bishop Limbo   Depression    Diabetes mellitus    pt denies   History of shingles 2009   Hyperlipidemia    on meds   Hypertension    on meds   Myocardial infarction (HCC) 1997   caused by a blood clot   Sleep apnea    cpap    PAST SURGICAL HISTORY: Past Surgical History:  Procedure Laterality Date   ANGIOPLASTY  1997   BACK SURGERY  04/02/2022   basel cell     skin carcinoma removed x 4   BILATERAL CARPAL TUNNEL RELEASE     CARDIAC CATHETERIZATION     10/04/1999   COLONOSCOPY  2018   SA-MAC-suprep(good)-TA   DOPPLER ECHOCARDIOGRAPHY  07/09/2006   EF 50 to 55 %, LA mildy dilated   HERNIA REPAIR  1951   RIGHT   KNEE ARTHROSCOPY Right 2001   R   NM MYOCAR PERF WALL MOTION  08/22/2011   Mets 13,low risk study   POLYPECTOMY  2018   TA   TOTAL KNEE ARTHROPLASTY Left 08/13/2022   Procedure: TOTAL KNEE ARTHROPLASTY;  Surgeon: Jene Every, MD;  Location: [facility];  Service: Orthopedics;  Laterality: Left;   WRIST SURGERY  1984   right    ALLERGIES: Allergies  Allergen Reactions   Oxycodone     FAMILY HISTORY: Family History  Problem Relation Age of Onset   Stroke Mother        TIA   COPD Mother    Hypertension Sister    Prostate cancer Father 23   Coronary artery disease Father        GM   Diabetes Brother    Prostate cancer Brother 61   Diabetes Maternal Grandmother    Colon cancer Neg Hx    Esophageal cancer Neg Hx    Colon polyps Neg Hx    Rectal cancer Neg Hx     Stomach cancer Neg Hx     SOCIAL HISTORY: Social History   Tobacco Use   Smoking status: Former    Types: Cigars    Quit date: 09/2020    Years since quitting: 2.2   Smokeless tobacco: Never  Vaping Use   Vaping status: Never Used  Substance  Use Topics   Alcohol use: Not Currently    Alcohol/week: 21.0 standard drinks of alcohol    Types: 21 Glasses of wine per week    Comment: Quit in January of 2024.   Drug use: No   Social History   Social History Narrative   Lives w/ wife      Right Handed    Lives in a two story home   Drinks two cups of coffee daily   retired        OBJECTIVE: PHYSICAL EXAM: BP 133/60   Pulse 65   Resp 18   Ht 5\' 10"  (1.778 m)   Wt 198 lb (89.8 kg)   SpO2 100%   BMI 28.41 kg/m   General:  General appearance: Awake and alert. No distress. Cooperative with exam.  Skin: No obvious rash or jaundice. HEENT: Atraumatic. Anicteric. Lungs: Non-labored breathing on room air  Heart: Regular Abdomen: Soft, non tender. Extremities: No edema. No obvious deformity.  Musculoskeletal: No obvious joint swelling. Psych: Affect appropriate.  Neurological: Mental Status: Alert. Speech fluent.  Cranial Nerves: CNII: No RAPD. Visual fields grossly intact. CNIII, IV, VI: PERRL. No nystagmus. EOMI. CN V: Facial sensation intact bilaterally to fine touch. [place] clench strong.   CN VII: Facial muscles symmetric and strong. No ptosis at rest or after sustained upgaze . CN VIII: Hearing grossly intact bilaterally. CN IX: No hypophonia. CN X: Palate elevates symmetrically. CN XI: Full strength shoulder shrug bilaterally. CN XII:  No atrophy or fasciculations.in tongue No  dysarthria  Motor: Tone is normal  Unable to abduct  L toes   Reflexes: 2/4 B in RUE, LUE, RLE, 1/4 LLE    Pathological Reflexes: Babinski:  Negative B response bilaterally   Sensation: Pinprick:  decreased atr the toes B L>R Vibration L<R  Temperature: decreased B at  toes Proprioception:   Normal Coordination: Intact finger-to- nose-finger bilaterally. Romberg negative.  Gait: Able to rise from chair with arms crossed unassisted. Normal, narrow-based gait. Able to tandem walk. Able to walk on toes and heels.      ASSESSMENT: YASHAS CAMILLI is a 72 y.o. male who presents for evaluation of B foot paresthesia . He has a  relevant medical history of DM2. He also has a history of DJD with recent spinal surgery and current L4-S1 disk disease followed at [facility]. Patient would benefit from an evaluation with his specialitst for this, as there may be worsening symptoms. He has been on PT 3 times weekly. Foot numbness is suspicious for neuropathy, likely diabetic in nature, workup is in progress.    PLAN: -Blood work: TSH,   ESR, CRP, copper, SPEP, IFE, folate, ANA,    [facility] -Return to clinic 2 months      Total time spent reviewing records, interview, history/exam, documentation, and coordination of care on day of encounter:  45 min   Thank you for allowing me to participate in patient's care.  If I can answer any additional questions, I would be pleased to do so.        CC: Mliss Sax, MD [address] [place] [address]  CC: Referring provider: Mliss Sax, MD [address] [place],  [place] [address]

## 2022-11-25 LAB — IMMUNOFIXATION ELECTROPHORESIS
IgG (Immunoglobin G), Serum: 1172 mg/dL (ref 600–1540)
IgM, Serum: 79 mg/dL (ref 50–300)
Immunoglobulin A: 312 mg/dL (ref 70–320)

## 2022-11-25 LAB — PROTEIN ELECTROPHORESIS, SERUM: Total Protein: 6.7 g/dL (ref 6.1–8.1)

## 2022-11-25 LAB — TSH: TSH: 2.28 u[IU]/mL (ref 0.35–5.50)

## 2022-11-25 LAB — SEDIMENTATION RATE: Sed Rate: 24 mm/hr — ABNORMAL HIGH (ref 0–20)

## 2022-11-25 LAB — FOLATE: Folate: >23.8 ng/mL (ref >5.9)

## 2022-11-27 LAB — ANA: Anti Nuclear Antibody (ANA): NEGATIVE

## 2022-11-27 LAB — PROTEIN ELECTROPHORESIS, SERUM
Albumin ELP: 3.7 g/dL — ABNORMAL LOW (ref 3.8–4.8)
Alpha 2: 0.7 g/dL (ref 0.5–0.9)
Beta Globulin: 0.5 g/dL (ref 0.4–0.6)
Gamma Globulin: 1.1 g/dL (ref 0.8–1.7)

## 2022-12-08 ENCOUNTER — Ambulatory Visit: Payer: Medicare Other | Admitting: Neurology

## 2022-12-08 DIAGNOSIS — M5417 Radiculopathy, lumbosacral region: Secondary | ICD-10-CM

## 2022-12-08 DIAGNOSIS — G5602 Carpal tunnel syndrome, left upper limb: Secondary | ICD-10-CM

## 2022-12-08 DIAGNOSIS — R202 Paresthesia of skin: Secondary | ICD-10-CM | POA: Diagnosis not present

## 2022-12-08 DIAGNOSIS — G629 Polyneuropathy, unspecified: Secondary | ICD-10-CM

## 2022-12-08 NOTE — Procedures (Signed)
[facility]  [address] [place], [address]  [place], [place] [address] Tel: [phone] Fax: [phone] Test Date:  12/08/2022  Patient: Dakota Gibbs DOB: December 14, 1950 Physician: Jacquelyne Balint, MD  Sex: Male Height: 5\' 10"  Ref Phys: Marlowe Kays, PA  ID#: [id number]   Technician:    History: This is a 72 year old male with bilateral leg paresthesias.  NCV & EMG Findings: Extensive electrodiagnostic evaluation of the left upper and lower limbs shows: Left sural and superficial peroneal/fibular sensory responses are absent. Left median sensory response shows prolonged distal peak latency (4.6 ms) and reduced amplitude (8 V). Left ulnar sensory response shows reduced amplitude (4 V). Left radial sensory response is within normal limits. Left peroneal/fibular (EDB) motor response is absent. Left median (APB) motor response shows reduced amplitude (3.8 mV). Left tibial (AH), peroneal/fibular (TA), and ulnar (ADM) motor responses are within normal limits. Left H reflex latency is within normal limits. Chronic motor axon loss changes without active denervation changes are seen in the left tibialis anterior, flexor digitorum longus, medial head of gastrocnemius, and first dorsal interosseous muscles. Lumbosacral paraspinal muscles were deferred due to prior low back surgery.  Impression: This is an abnormal study. The findings are most consistent with the following: Evidence of a length dependent large fiber sensorimotor neuropathy, axon loss in type, moderate in degree electrically. Possible overlapping distal manifestations of an old intraspinal canal lesion (ie: motor radiculopathy) at the left L5 root or segment. Evidence of a left median mononeuropathy at or distal to the wrist consistent with carpal tunnel syndrome, moderate in degree electrically. No electrodiagnostic evidence of a left cervical (C5-C8) radiculopathy.    ___________________________ Jacquelyne Balint,  MD    Nerve Conduction Studies Motor Nerve Results    Latency Amplitude F-Lat Segment Distance CV Comment  Site (ms) Norm (mV) Norm (ms)  (cm) (m/s) Norm   Left Fibular (EDB) Motor  Ankle *NR  < 6.0 *NR  > 2.5        Bel fib head *NR - *NR -  Bel fib head-Ankle - *NR  > 40   Pop fossa *NR - *NR -  Pop fossa-Bel fib head - *NR -   Left Fibular (TA) Motor  Fib head 2.3  < 4.5 5.4  > 3.0        Pop fossa 4.1  < 6.7 5.2 -  Pop fossa-Fib head 10 56  > 40   Left Median (APB) Motor  Wrist 3.7  < 4.0 *3.8  > 5.0      Unable to remove dip (moved x4)  Elbow 10.4 - 3.8 -  Elbow-Wrist - -  > 50   Left Tibial (AH) Motor  Ankle 3.5  < 6.0 7.6  > 4.0        Knee 13.3 - 4.6 -  Knee-Ankle 43 44  > 40   Left Ulnar (ADM) Motor  Wrist 2.3  < 3.1 9.7  > 7.0        Bel elbow 6.5 - 9.4 -  Bel elbow-Wrist 22 52  > 50   Ab elbow 8.5 - 9.1 -  Ab elbow-Bel elbow 10 50 -    Sensory Sites    Neg Peak Lat Amplitude (O-P) Segment Distance Velocity Comment  Site (ms) Norm (V) Norm  (cm) (ms)   Left Median Sensory  Wrist-Dig II *4.6  < 3.8 *8  > 10 Wrist-Dig II 13    Left Radial Sensory  Forearm-Wrist 2.4  <  2.8 18  > 10 Forearm-Wrist 10    Left Superficial Fibular Sensory  14 cm-Ankle *NR  < 4.6 *NR  > 3 14 cm-Ankle 14    Left Sural Sensory  Calf-Lat mall *NR  < 4.6 *NR  > 3 Calf-Lat mall 14    Left Ulnar Sensory  Wrist-Dig V 2.9  < 3.2 *4  > 5 Wrist-Dig V 11     H-Reflex Results    M-Lat H Lat H Neg Amp H-M Lat  Site (ms) (ms) Norm (mV) (ms)  Left Tibial H-Reflex  Pop fossa 6.4 30.4  < 35.0 4.5 24.0   Electromyography   Side Muscle Ins.Act Fibs Fasc Recrt Amp Dur Poly Activation Comment  Left Tib ant Nml Nml Nml *1- *1+ *1+ *1+ Nml N/A  Left Gastroc MH Nml Nml Nml *1- *1+ *1+ *1+ Nml N/A  Left FDL Nml Nml Nml *2- *1+ *1+ *1+ Nml N/A  Left Rectus fem Nml Nml Nml Nml Nml Nml Nml Nml N/A  Left Biceps fem SH Nml Nml Nml Nml Nml Nml Nml Nml N/A  Left Gluteus med Nml Nml Nml Nml Nml Nml Nml Nml  N/A  Left FDI Nml Nml Nml *1- *1+ *1+ *1+ Nml N/A  Left EIP Nml Nml Nml Nml Nml Nml Nml Nml N/A  Left Pronator teres Nml Nml Nml Nml Nml Nml Nml Nml N/A  Left Biceps Nml Nml Nml Nml Nml Nml Nml Nml N/A  Left Triceps lat hd Nml Nml Nml Nml Nml Nml Nml Nml N/A  Left Deltoid Nml Nml Nml Nml Nml Nml Nml Nml N/A      Waveforms:  Motor             Sensory             H-Reflex

## 2022-12-10 ENCOUNTER — Encounter: Payer: Self-pay | Admitting: Physician Assistant

## 2022-12-11 NOTE — Progress Notes (Signed)
  For carpal tunnel recommend wrist splint (see below). But if this does not work, then I consider hand surgery referral.   Thre results show neuropathy.  For the neuropathy: try iron supplements over the counter, monitor the sugars closely.  Fixing any of those can help prevent progression. Are symptoms bothersome? If so we can try a medicine to help, such as Cymbalta or Lyrica . We could refer to PT if he already is not doing so.  Let us know , thanks

## 2022-12-16 DIAGNOSIS — M25531 Pain in right wrist: Secondary | ICD-10-CM | POA: Diagnosis not present

## 2022-12-16 DIAGNOSIS — G5602 Carpal tunnel syndrome, left upper limb: Secondary | ICD-10-CM | POA: Diagnosis not present

## 2022-12-18 DIAGNOSIS — M25561 Pain in right knee: Secondary | ICD-10-CM | POA: Diagnosis not present

## 2022-12-23 DIAGNOSIS — C44319 Basal cell carcinoma of skin of other parts of face: Secondary | ICD-10-CM | POA: Diagnosis not present

## 2022-12-23 DIAGNOSIS — C44212 Basal cell carcinoma of skin of right ear and external auricular canal: Secondary | ICD-10-CM | POA: Diagnosis not present

## 2022-12-31 ENCOUNTER — Ambulatory Visit: Payer: Medicare Other | Admitting: Physician Assistant

## 2022-12-31 ENCOUNTER — Encounter: Payer: Self-pay | Admitting: Physician Assistant

## 2022-12-31 VITALS — BP 114/60 | HR 67 | Resp 20 | Ht 70.0 in | Wt 194.0 lb

## 2022-12-31 DIAGNOSIS — R202 Paresthesia of skin: Secondary | ICD-10-CM

## 2022-12-31 DIAGNOSIS — M5417 Radiculopathy, lumbosacral region: Secondary | ICD-10-CM

## 2022-12-31 DIAGNOSIS — G629 Polyneuropathy, unspecified: Secondary | ICD-10-CM

## 2022-12-31 MED ORDER — PREGABALIN 25 MG PO CAPS: 25.0000 mg | ORAL_CAPSULE | Freq: Every day | ORAL | 3 refills | Status: DC

## 2022-12-31 NOTE — Patient Instructions (Addendum)
Referral to Dr. Shon Baton at [facility] for Lumbosacral pain and numbness  Start Lyrica 25 mg daily may increase after 4 weeks to 25 mg twice a day  Follow up in 3 months Nov 26 at 11:30

## 2022-12-31 NOTE — Progress Notes (Signed)
Neurology clinic note  SERVICE DATE: 11/09/22  Reason for Evaluation:   Bilateral foot paresthesia   Initial HPI 11/2022: This is [name], a 72 y.o. R-handed male with a medical history of hypertension, hyperlipidemia, anxiety, diastolic heart failure, prior history of bradycardia, OSA on CPAP, diabetes, CAD status post MI, depression , prior alcohol and tobacco habituation,  who presents to neurology clinic with the chief complaint of bilateral leg paresthesia.      The patient has had similar episodes of symptoms in the past.  "3 years ago both feet were feeling very heavy and would not fell them if I bumped into something. ". "I fell in a line in October of last year and I was sent to [facility] because I needed a laminectomy of the L3-4 after needing to sit if I were standing longer than 20 minutes. After the surgery in Nov 2023 I could walk again  but recently the numbness came back . If I stand for 40 mins, it goes numb, I have to sit for a few mins and I am OK"-he says .  Recently he also experiences "stepping into something sharp, like a dry burning" if walking barefooted. L>R . If clipping the nails he may not feel the tip of the toes.   Muscle bulk loss? denies Muscle pain? no  Cramps/Twitching? no Able to get out of chair without using arms? yes   Use an assistive device to walk? No    Any change in urine color, especially after exertion/physical activity? no  The patient denies  diplopia, ptosis, dysphagia, poor saliva control, dysarthria/dysphonia, impaired mastication, facial weakness/droop.  There are no neuromuscular respiratory weakness symptoms, particularly orthopnea>dyspnea.   Denies  bowel or bladder dyscontrol  syncope/presyncope/orthostatic intolerance or saddle anesthesia.   Denies fever, night sweats, anorexia or unintentional weight loss.  ETOH use: has significantly reduced it to 1-2 drinks a week  Restrictive diet? diabetic   Current  medications being tried for the patient's symptoms include B12, B1 B3   Prior medications   gabapentin for a brief period    Update   He continues to feel like "stepping into something sharp, like a dry burning" if walking barefooted. L>R . If clipping the nails he may not feel the tip of the toes.   Muscle bulk loss? Denies  Muscle pain? Denies  Cramps/Twitching? Denies  Able to get out of chair without using arms? Yes Use an assistive device to walk? No                                                                                                                                                                       Any change in urine color,  especially after exertion/physical activity? no  The patient denies  diplopia, ptosis, dysphagia, poor saliva control, dysarthria/dysphonia, impaired mastication, facial weakness/droop.  There are no neuromuscular respiratory weakness symptoms, particularly orthopnea>dyspnea.   Denies  bowel or bladder dyscontrol  syncope/presyncope/orthostatic intolerance or saddle anesthesia.   Denies fever, night sweats, anorexia or unintentional weight loss.  NCV & EMG Findings: Extensive electrodiagnostic evaluation of the left upper and lower limbs shows: Left sural and superficial peroneal/fibular sensory responses are absent. Left median sensory response shows prolonged distal peak latency (4.6 ms) and reduced amplitude (8 V). Left ulnar sensory response shows reduced amplitude (4 V). Left radial sensory response is within normal limits. Left peroneal/fibular (EDB) motor response is absent. Left median (APB) motor response shows reduced amplitude (3.8 mV). Left tibial (AH), peroneal/fibular (TA), and ulnar (ADM) motor responses are within normal limits. Left H reflex latency is within normal limits. Chronic motor axon loss changes without active denervation changes are seen in the left tibialis anterior, flexor digitorum longus, medial head of  gastrocnemius, and first dorsal interosseous muscles. Lumbosacral paraspinal muscles were deferred due to prior low back surgery.   Results impression This is an abnormal study. The findings are most consistent with the following: Evidence of a length dependent large fiber sensorimotor neuropathy, axon loss in type, moderate in degree electrically. Possible overlapping distal manifestations of an old intraspinal canal lesion (ie: motor radiculopathy) at the left L5 root or segment. Evidence of a left median mononeuropathy at or distal to the wrist consistent with carpal tunnel syndrome, moderate in degree electrically. No electrodiagnostic evidence of a left cervical (C5-C8) radiculopathy.   Pertinent labs, folate greater than 23.8, unremarkable copper 104, ANA negative sed rate 24 (upper normal 20), TSH 2.28, B1 26, lipid panel normal iron 55, TIBC 302, percentage saturation 18 (20-48), ferritin 190.  B12 536  MEDICATIONS:  Outpatient Encounter Medications as of 12/31/2022  Medication Sig Note   aspirin EC 81 MG tablet Take 1 tablet (81 mg total) by mouth 2 (two) times daily after a meal. Day after surgery    cetirizine (ZYRTEC) 10 MG tablet Take 10 mg by mouth daily.    Cholecalciferol (VITAMIN D) 50 MCG (2000 UT) tablet Take 2,000 Units by mouth daily.    citalopram (CELEXA) 20 MG tablet ( GENERIC FOR CELEXA) TAKE ONE AND ONE-HALF TABLETS BY MOUTH DAILY    cyanocobalamin (VITAMIN B12) 1000 MCG tablet Take 1,000 mcg by mouth daily.    ezetimibe (ZETIA) 10 MG tablet TAKE ONE TABLET BY MOUTH DAILY (Patient taking differently: Take 10 mg by mouth at bedtime.)    fluticasone (FLONASE) 50 MCG/ACT nasal spray Place 1 spray into both nostrils daily.    metFORMIN (GLUCOPHAGE-XR) 500 MG 24 hr tablet TAKE ONE TABLET BY MOUTH EVERY DAY WITH BREAKFAST    Multiple Vitamin (MULTIVITAMIN) tablet Take 1 tablet by mouth daily.    olmesartan (BENICAR) 40 MG tablet Take 1 tablet (40 mg total) by mouth at  bedtime.    pregabalin (LYRICA) 25 MG capsule Take 1 capsule (25 mg total) by mouth daily.    pyridOXINE (VITAMIN B6) 100 MG tablet Take 100 mg by mouth daily.    trolamine salicylate (ASPERCREME) 10 % cream Apply 1 Application topically as needed for muscle pain.    vitamin B-12 (CYANOCOBALAMIN) 100 MCG tablet 100 mcg.    vitamin C (ASCORBIC ACID) 500 MG tablet Take 500 mg by mouth daily.    nitroGLYCERIN (NITROSTAT) 0.4 MG SL tablet For chest pain,  tightness, or pressure. While sitting, place 1 tablet under tongue. May be used every 5 minutes as needed, for up to 15 minutes. Do not use more than 3 tablets. (Patient not taking: Reported on 11/24/2022) 08/27/2022: Patient has if needed.   oxymetazoline (AFRIN) 0.05 % nasal spray Place 1 spray into both nostrils at bedtime. (Patient not taking: Reported on 12/31/2022)    traMADol (ULTRAM) 50 MG tablet Take 1-2 tablets by mouth every 6 (six) hours as needed for moderate pain or severe pain. (Patient not taking: Reported on 11/24/2022)    [DISCONTINUED] citalopram (CELEXA) 20 MG tablet Take 1.5 tablets by mouth daily.    [DISCONTINUED] tadalafil (CIALIS) 20 MG tablet May take one or two up to every other day as needed for ed.    No facility-administered encounter medications on file as of 12/31/2022.    PAST MEDICAL HISTORY: Past Medical History:  Diagnosis Date   Allergy    enviornmental   Anxiety    Anxiety and depression 10/25/2012   Arthritis    self dx   Back pain    L4-L5 bulging disc, L5-S1 - bulging disc, pinched nerve in neck   Cataract    bilateral sx   Coronary artery disease    hx of balloon angioplasty with DR Bishop Limbo   Depression    Diabetes mellitus    pt denies   History of shingles 2009   Hyperlipidemia    on meds   Hypertension    on meds   Myocardial infarction (HCC) 1997   caused by a blood clot   Sleep apnea    cpap    PAST SURGICAL HISTORY: Past Surgical History:  Procedure Laterality Date   ANGIOPLASTY   1997   BACK SURGERY  04/02/2022   basel cell     skin carcinoma removed x 4   BILATERAL CARPAL TUNNEL RELEASE     CARDIAC CATHETERIZATION     10/04/1999   COLONOSCOPY  2018   SA-MAC-suprep(good)-TA   DOPPLER ECHOCARDIOGRAPHY  07/09/2006   EF 50 to 55 %, LA mildy dilated   HERNIA REPAIR  1951   RIGHT   KNEE ARTHROSCOPY Right 2001   R   NM MYOCAR PERF WALL MOTION  08/22/2011   Mets 13,low risk study   POLYPECTOMY  2018   TA   TOTAL KNEE ARTHROPLASTY Left 08/13/2022   Procedure: TOTAL KNEE ARTHROPLASTY;  Surgeon: Jene Every, MD;  Location: [facility];  Service: Orthopedics;  Laterality: Left;   WRIST SURGERY  1984   right    ALLERGIES: Allergies  Allergen Reactions   Hydrocodone Anxiety, Other (See Comments), Nausea And Vomiting, Nausea Only and Shortness Of Breath   Oxycodone     FAMILY HISTORY: Family History  Problem Relation Age of Onset   Stroke Mother        TIA   COPD Mother    Hypertension Sister    Prostate cancer Father 86   Coronary artery disease Father        GM   Diabetes Brother    Prostate cancer Brother 31   Diabetes Maternal Grandmother    Colon cancer Neg Hx    Esophageal cancer Neg Hx    Colon polyps Neg Hx    Rectal cancer Neg Hx    Stomach cancer Neg Hx     SOCIAL HISTORY: Social History   Tobacco Use   Smoking status: Former    Types: Cigars    Quit date: 09/2020  Years since quitting: 2.3   Smokeless tobacco: Never  Vaping Use   Vaping status: Never Used  Substance Use Topics   Alcohol use: Not Currently    Alcohol/week: 21.0 standard drinks of alcohol    Types: 21 Glasses of wine per week    Comment: Quit in January of 2024.   Drug use: No   Social History   Social History Narrative   Lives w/ wife      Right Handed    Lives in a two story home   Drinks two cups of coffee daily   retired        OBJECTIVE: PHYSICAL EXAM: BP 114/60   Pulse 67   Resp 20   Ht 5\' 10"  (1.778 m)   Wt 194 lb (88 kg)   SpO2 98%    BMI 27.84 kg/m   General:  General appearance: Awake and alert. No distress. Cooperative with exam.  Skin: No obvious rash or jaundice.  Extremities: No edema. No obvious deformity.  Musculoskeletal: No obvious joint swelling. Psych: Affect appropriate.  Neurological: Mental Status: Alert. Speech fluent.  Cranial Nerves:   CN VII: Facial muscles symmetric and strong. No ptosis at rest or after sustained upgaze . CN VIII: Hearing grossly intact bilaterally. CN IX: No hypophonia. Motor: Tone is normal  Unable to abduct  L toes   Reflexes: 2/4 B in RUE, LUE, RLE, 1/4 LLE    Pathological Reflexes: Babinski:  Negative B response bilaterally   Sensation: Pinprick:  decreased at the toes B L>R Vibration L<R  Temperature: decreased B at toes Proprioception:   Normal Coordination: Intact finger-to- nose-finger bilaterally.   Gait: Able to rise from chair with arms crossed unassisted. Normal, narrow-based gait. Able to tandem walk.       ASSESSMENT AND PLAN: Dakota Gibbs is a 72 y.o. male who presented for evaluation of B foot paresthesia . He has a  relevant medical history of DM2. He also has a history of DJD with recent spinal surgery and current L4-S1 disk disease followed at [facility] and with new finding per EMG /NCS for  length dependent large fiber sensorimotor neuropathy, axon loss in type, moderate in degree electrically,  with possible overlapping distal manifestations of an old intraspinal canal lesion (ie: motor radiculopathy) at the left L5 root or segment. Moderate L wrist CTS, and negative for L C5-C8 radiculopathy Blood tests were unremarkable  Patient has an appt with [facility] for the above L5 abnormalities Continue PT Start Lyrica 25 mg qhs, may increase to 25 mg if tolerated and needed, titrate to comfort,  may be increasing to 50 mg tid with max dose 300 mg daily as pat is diabetic . Side effects discussed      Total time spent reviewing records,  interview, history/exam, documentation, and coordination of care on day of encounter:  45 min   Thank you for allowing me to participate in patient's care.  If I can answer any additional questions, I would be pleased to do so.        CC: Mliss Sax, MD [address] [place] [address]  CC: Referring provider: Mliss Sax, MD [address] [place],  [place] [address]

## 2023-01-04 DIAGNOSIS — E119 Type 2 diabetes mellitus without complications: Secondary | ICD-10-CM | POA: Diagnosis not present

## 2023-01-09 ENCOUNTER — Other Ambulatory Visit: Payer: Self-pay | Admitting: Physician Assistant

## 2023-01-09 ENCOUNTER — Encounter: Payer: Self-pay | Admitting: Physician Assistant

## 2023-01-09 MED ORDER — PREGABALIN 50 MG PO CAPS: 50.0000 mg | ORAL_CAPSULE | Freq: Every day | ORAL | 3 refills | Status: AC

## 2023-01-13 DIAGNOSIS — M25531 Pain in right wrist: Secondary | ICD-10-CM | POA: Diagnosis not present

## 2023-01-13 DIAGNOSIS — Z4889 Encounter for other specified surgical aftercare: Secondary | ICD-10-CM | POA: Diagnosis not present

## 2023-01-16 ENCOUNTER — Ambulatory Visit: Payer: Medicare Other | Admitting: Physician Assistant

## 2023-02-02 ENCOUNTER — Encounter: Payer: Self-pay | Admitting: Cardiovascular Disease

## 2023-02-02 ENCOUNTER — Ambulatory Visit: Payer: Medicare Other | Attending: Cardiovascular Disease | Admitting: Cardiovascular Disease

## 2023-02-02 VITALS — BP 164/94 | HR 44 | Ht 70.0 in | Wt 199.2 lb

## 2023-02-02 DIAGNOSIS — I2510 Atherosclerotic heart disease of native coronary artery without angina pectoris: Secondary | ICD-10-CM | POA: Diagnosis not present

## 2023-02-02 DIAGNOSIS — I10 Essential (primary) hypertension: Secondary | ICD-10-CM | POA: Diagnosis not present

## 2023-02-02 DIAGNOSIS — G4733 Obstructive sleep apnea (adult) (pediatric): Secondary | ICD-10-CM | POA: Diagnosis not present

## 2023-02-02 DIAGNOSIS — E785 Hyperlipidemia, unspecified: Secondary | ICD-10-CM | POA: Diagnosis not present

## 2023-02-02 DIAGNOSIS — R001 Bradycardia, unspecified: Secondary | ICD-10-CM

## 2023-02-02 DIAGNOSIS — I5189 Other ill-defined heart diseases: Secondary | ICD-10-CM

## 2023-02-02 MED ORDER — ATORVASTATIN CALCIUM 20 MG PO TABS: 20.0000 mg | ORAL_TABLET | Freq: Every day | ORAL | 3 refills | Status: DC

## 2023-02-02 MED ORDER — AMLODIPINE BESYLATE 5 MG PO TABS: 2.5000 mg | ORAL_TABLET | Freq: Every day | ORAL | 3 refills | Status: DC

## 2023-02-02 NOTE — Patient Instructions (Addendum)
Medication Instructions:  Begin Amlodipine 5mg . Take one tablet daily. New prescription has been sent to your pharmacy  Decrease the Atorvastatin from 40mg  to 20mg  daily. New prescription has been sent to your pharmacy.  *If you need a refill on your cardiac medications before your next appointment, please call your pharmacy*   Lab Work: None If you have labs (blood work) drawn today and your tests are completely normal, you will receive your results only by: MyChart Message (if you have MyChart) OR A paper copy in the mail If you have any lab test that is abnormal or we need to change your treatment, we will call you to review the results.    Follow-Up: At [facility], you and your health needs are our priority.  As part of our continuing mission to provide you with exceptional heart care, we have created designated Provider Care Teams.  These Care Teams include your primary Cardiologist (physician) and Advanced Practice Providers (APPs -  Physician Assistants and Nurse Practitioners) who all work together to provide you with the care you need, when you need it.  We recommend signing up for the patient portal called "MyChart".  Sign up information is provided on this After Visit Summary.  MyChart is used to connect with patients for Virtual Visits (Telemedicine).  Patients are able to view lab/test results, encounter notes, upcoming appointments, etc.  Non-urgent messages can be sent to your provider as well.   To learn more about what you can do with MyChart, go to [email or web address].    Your next appointment:    Pending Pharm D consult  Provider:   Nicki Guadalajara, MD

## 2023-02-02 NOTE — Progress Notes (Signed)
Hand   patient ID: Dakota Gibbs, male   DOB: 02-01-1951, 72 y.o.   MRN: [id number]     PCP: Dr. Willow Ora  HPI: Dakota Gibbs is a 72 y.o. male who presents to the office today for a 37 month cardiology and sleep evaluation.  Dakota Gibbs suffered an inferior wall myocardial infarction on 05/10/1995 and underwent acute intervention to a totally occluded RCA. He had more distal disease in the posterolateral vessel which was treated medically. Several days later he underwent staged intervention to circumflex marginal vessel. His last cardiac catheterization was in 2001 which did not show restenosis and actually showed coronary artery disease regression.  He has been aggressively treated since his initial event.  When I saw him several  years ago he had noticed several episodes in the early morning while sleeping that he develops nocturnal palpitations. Upon further questioning he does snore and his snoring is more significantly abnormal particularly after drinking alcohol. He wakes up one to 2 times per night.  He was started on Celexa for anxiety/depression which has helped.  He has a history of hyperlipidemia and has been tolerating Lipitor. He also has GERD, and hypertension.   Over the past year, he admits to a 25 pound weight loss. In contrast to his previous diet many years ago when he tried the [employer], his current diet is eating less along with few carbohydrates. His weight is reduced from 208-180 3 pounds. He is sleeping significantly improved. He is not aware of any snoring.  He continues to take amlodipine 5 mg, lisinopril 20, no grams, Toprol-XL 50 mg for hypertension and CAD.  He is on Zantac 150 mg for dyspepsia/GERD. He has more energy.  Lab work done by his primary physician had shown improvement in his hemoglobin A1c at 6.0, improved from 6.6.   I saw him in March 2018.  At that time he continues to do well an was working in the executive recruiting business.  He  denied chest pain or shortness of breath.  At times he believes he may be having some short-term memory loss.    I saw him in the clinic on a DOD on Sep 06, 2018.  However, with the COVID-19 pandemic, he had not been going to the gym.  Over the past month, he has began to notice episodes of transient lightheadedness and dizziness which typically occur during physical exertion.  He denies any frank syncope.  He denies exertional chest pain or palpitations.  He has been noticing that his blood pressure has been in the 140 to 150 range.  He has continued to be on Toprol-XL 50 mg, amlodipine 5 mg, and lisinopril 20 mg.  He is on atorvastatin for hyperlipidemia.  He continues to be on Celexa.  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    During that evaluation, his ECG revealed sinus bradycardia at 43 bpm.  Laboratory also had shown elevated potassium at 5.6.  I decreased lisinopril from 20 mg down to 10 mg and titrated amlodipine to 10 mg.  I provided him information regarding potassium and certain foods and fruits.  An echo Doppler study as well as carotid duplex imaging were recommended.   Repeat laboratory several days later showed a potassium of 4.9.  Patient did realize that he was having a fair amount of grapefruit juice and a Seabreeze drink  which also contributed to his potassium elevation.  His echo Doppler study showed an EF of 60 to 65% with grade 1 diastolic dysfunction.  There was minimal increased RV systolic pressure at 32.6 mm.  There was mild right atrial dilatation.  His carotid study showed mild bilateral internal carotid plaque in the 1 to 39% range with greater than 50% ECA stenoses.  He had normal antegrade flow bilaterally in the vertebral arteries and his subclavian arteries were normal.   Since I  saw him, he has monitored his blood pressure and has average 40 readings.  He  states his blood pressure has run over 40 samples averaging 142/59.  His pulse is increased slightly now in the 50s with an average of 53.  He had undergone repeat laboratory which had shown his LDL cholesterol had risen to 85.  He denies any episodes of chest pain or shortness of breath.  However, he still notes occasional episodes of transient dizziness . He presents for evaluation  He was evaluated in a telemedicine visit on Sep 20, 2018.  At that time his blood pressure had improved.  He was on amlodipine 10 mg, reduced dose of  lisinopril at 10 mg, and Toprol-XL 25 mg.  He continued to have some episodes of dizziness and bradycardia and during that evaluation recommended reduction of Toprol-XL to 12.5 mg daily.  I reviewed his echo Doppler data as well as his carotid duplex imaging studies.  He had normal bilateral antegrade flow to his vertebrals and he did not have any high-grade carotid stenoses.  With his LDL cholesterol of 85 on atorvastatin 40 mg I added Zetia 10 mg.  Repeat potassium improved to 4.9 with discontinuance of his frequent grapefruit juice.  I recommended that he undergo an event monitor for further evaluation of potential arrhythmia.  He was monitored from June 30 through November 15, 2018.  The predominant rhythm was sinus rhythm with the fastest episode of sinus tachycardia at 110 bpm at 8:28 PM on July 4.  His slowest episode was sinus bradycardia at 46 bpm while sleeping at 3:50 AM on July 8.  There were occasional PVCs representing 1% of his abnormal rhythm.  There was 1 episode of 4 beats of multifocal ventricular tachycardia on July 3.  Otherwise PVCs were isolated.  There were no episodes of atrial fibrillation.  There were no pauses.   I saw him in August 2020 at which time his syncope had improved.  Presyncope has improved.  However he does note that he still feels episodes of some lightheadedness.  Today he planted for plants in the heat.  He also cuts rubs.  He denied any  definitive orthostatic symptoms.  He walked a mile today without chest pain or dizziness.    He was last evaluated in a telemedicine visit on March 03, 2019.  At that time his symptoms had dramatically improved.  He was no longer having significant shortness of breath with walking or chest pressure.  He still noted mild fatigability.  He was unaware of palpitations.  When I saw him in May 2021 his major complaint was that he was tired all the time.  He notes shortness of breath and fatigue particularly while walking in his garden.  He still able to walk a mile 3 to 4 days/week.  He is back at work in his executive recruiting business.  He does note some early morning palpitations typically around 5 AM.  Recently his blood pressure has been elevated  and may reach 160 systolically.  I recommended dose titration of lisinopril from 10 mg up to 20 mg I also recommended he undergo a sleep study for evaluation of sleep apnea.  On October 17, 2019 he underwent his diagnostic polysomnogram.  This revealed severe obstructive sleep apnea with an AHI of 39.2, RDI of 57.4, NREM AHI of 30/h.  Events were worse with supine sleep with an AHI of 58.1.  Oxygen desaturated to a nadir of 85%.  CPAP titration was recommended.   Dakota Gibbs developed Covid in late July and was not hospitalized but felt markedly fatigued for 3 weeks.  He never underwent a CPAP titration trial and wanted to discuss this further with me today.  He is unaware of any palpitations or orthostatic symptoms.    When I saw him in August 2021 strongly recommended he pursue the CPAP titration study.  I also recommended he undergo a follow-up echo Doppler study following his Covid infection to make certain there was no residual myocardial effects.  Following his Covid exposure he underwent a booster vaccination.  He was maintaining sinus rhythm and his blood pressure was stable.  His echo Doppler study was done on 01/10/2020 which showed an EF of 55 to 60%  with grade 1 diastolic dysfunction.  There is mild biatrial enlargement.  There was mild dilation of his ascending aorta millimeters.  I saw him in January 2022.  He had undergone a CPAP titration on 02/09/2020 and was titrated up to 10 cm water pressure with AHI of 0 and O2 nadir at 92.  Dakota Gibbs CPAP set up date was 04/12/2020.  Since initiating therapy he has used CPAP with 100% compliance.  A download was obtained from his initiation until presently which is approximately 3 weeks.  Average usage was 9 hours and 38 minutes.  At a 10 cm set pressure, AHI was 7.2 he did have central events at 2.5/h.  He believes he is sleeping much better since initiating CPAP therapy.  He typically goes to bed at 9 PM and wakes up sometime around 8 AM.  With CPAP therapy, he felt significantly improved.  I He denied any residual daytime sleepiness.  An Epworth Sleepiness Scale score was calculated in the office and this endorsed at 4. I  made adjustments to his prescription and changed him from a set pressure to an auto pressure with a range of 10 to 15 minutes.  I also decreased his ramp time down to 5 minutes.    I last saw him on November 10, 2020. Approximately 1 month previously he had a presyncopal spell and this occurred on a day that was extremely hot, he was somewhat dehydrated, and had been working significantly in his large yard.  His blood pressure dropped to 95/39 and he believes he may have passed out briefly.  He denied any associated tachycardia dysrhythmias.  He is sleeping much better with CPAP.  A new download was obtained from June 16 through November 18, 2020.  Usage was excellent with essentially 100% compliance and average use of 10 hours per night.  His AutoSet CPAP air sense 10 unit is set at a pressure of 10 to 15 cm of water.  AHI is 6.2.  95th percentile pressure is 12.7 with maximum average pressure 13.9.  He feels well.  He denies swelling and has been taking HCTZ 12.5 mg twice a day in addition to  olmesartan 20 mg, amlodipine 10 mg and metoprolol succinate 12.5 mg daily.  With his recent dehydration, I suggested he reduce his hydrochlorothiazide.  Since I last saw him, he was hospitalized in April 2024 2 days after undergoing left total knee replacement with complaints of shortness of breath and generalized weakness and oxygen desaturation.  An echo Doppler study on August 16, 2022 showed hyperdynamic LV function with EF 70 to 75%, grade 1 diastolic dysfunction, moderate right atrial and severe left atrial dilatation as well as mild dilation of aortic borderline at 39 mm.  Showed he was started on Lasix with excellent diuresis.  He wore a 14-day Zio patch which showed minimum heart rate 42, maximum heart rate 164 and he had 9 brief runs of SVT with the fastest at 8 beats with a maximum rate of 164 the longest 11.8 seconds averaging 113 bpm.  Subsequently, he was evaluated by Carlos Levering on November 12, 2022.  Presently, Dakota Gibbs has noted blood pressure elevation.  He denies any anginal symptoms.  He has issues with neuropathy and a pinched nerve at L5.  He has been on olmesartan 40 mg, atorvastatin 40 mg in addition to Zetia 10 mg.  He has been taking Celexa.  He takes a baby aspirin.  Laboratory on June 19 showed total cholesterol 95 HDL 42 LDL 25 triglycerides 139.  He continues to use CPAP and is sleeping well.  A download from September 1 through February 02, 2023 shows excellent compliance with average use of 10 hours and 21 minutes.  His CPAP is set at a range of 9 to 15 cm of water.  AHI is increased to 8.0 with 95th percentile pressure 12.2 with maximum average pressure 13.2.  He feels he is sleeping well.  He presents for evaluation.  Past Medical History:  Diagnosis Date   Allergy    enviornmental   Anxiety    Anxiety and depression 10/25/2012   Arthritis    self dx   Back pain    L4-L5 bulging disc, L5-S1 - bulging disc, pinched nerve in neck   Cataract    bilateral sx    Coronary artery disease    hx of balloon angioplasty with DR Bishop Limbo   Depression    Diabetes mellitus    pt denies   History of shingles 2009   Hyperlipidemia    on meds   Hypertension    on meds   Myocardial infarction (HCC) 1997   caused by a blood clot   Sleep apnea    cpap    Past Surgical History:  Procedure Laterality Date   ANGIOPLASTY  1997   BACK SURGERY  04/02/2022   basel cell     skin carcinoma removed x 4   BILATERAL CARPAL TUNNEL RELEASE     CARDIAC CATHETERIZATION     10/04/1999   COLONOSCOPY  2018   SA-MAC-suprep(good)-TA   DOPPLER ECHOCARDIOGRAPHY  07/09/2006   EF 50 to 55 %, LA mildy dilated   HERNIA REPAIR  1951   RIGHT   KNEE ARTHROSCOPY Right 2001   R   NM MYOCAR PERF WALL MOTION  08/22/2011   Mets 13,low risk study   POLYPECTOMY  2018   TA   TOTAL KNEE ARTHROPLASTY Left 08/13/2022   Procedure: TOTAL KNEE ARTHROPLASTY;  Surgeon: Jene Every, MD;  Location: [facility];  Service: Orthopedics;  Laterality: Left;   WRIST SURGERY  1984   right    Allergies  Allergen Reactions   Hydrocodone Anxiety, Other (See Comments), Nausea And Vomiting, Nausea Only and Shortness  Of Breath   Oxycodone     Current Outpatient Medications  Medication Sig Dispense Refill   amLODipine (NORVASC) 5 MG tablet Take 0.5 tablets (2.5 mg total) by mouth daily. 180 tablet 3   aspirin EC 81 MG tablet Take 81 mg by mouth daily. Swallow whole.     atorvastatin (LIPITOR) 20 MG tablet Take 1 tablet (20 mg total) by mouth daily. 90 tablet 3   cetirizine (ZYRTEC) 10 MG tablet Take 10 mg by mouth daily.     Cholecalciferol (VITAMIN D) 50 MCG (2000 UT) tablet Take 2,000 Units by mouth daily.     citalopram (CELEXA) 20 MG tablet ( GENERIC FOR CELEXA) TAKE ONE AND ONE-HALF TABLETS BY MOUTH DAILY 135 tablet 1   ezetimibe (ZETIA) 10 MG tablet TAKE ONE TABLET BY MOUTH DAILY (Patient taking differently: Take 10 mg by mouth at bedtime.) 90 tablet 3   fluticasone (FLONASE) 50 MCG/ACT nasal  spray Place 1 spray into both nostrils daily.     metFORMIN (GLUCOPHAGE-XR) 500 MG 24 hr tablet TAKE ONE TABLET BY MOUTH EVERY DAY WITH BREAKFAST 90 tablet 1   Multiple Vitamin (MULTIVITAMIN) tablet Take 1 tablet by mouth daily.     nitroGLYCERIN (NITROSTAT) 0.4 MG SL tablet For chest pain, tightness, or pressure. While sitting, place 1 tablet under tongue. May be used every 5 minutes as needed, for up to 15 minutes. Do not use more than 3 tablets. 25 tablet 6   olmesartan (BENICAR) 40 MG tablet Take 1 tablet (40 mg total) by mouth at bedtime. 90 tablet 1   oxymetazoline (AFRIN) 0.05 % nasal spray Place 1 spray into both nostrils at bedtime.     pregabalin (LYRICA) 50 MG capsule Take 1 capsule (50 mg total) by mouth daily. 30 capsule 3   pyridOXINE (VITAMIN B6) 100 MG tablet Take 100 mg by mouth daily.     traMADol (ULTRAM) 50 MG tablet Take 1-2 tablets by mouth every 6 (six) hours as needed for moderate pain or severe pain.     trolamine salicylate (ASPERCREME) 10 % cream Apply 1 Application topically as needed for muscle pain.     vitamin B-12 (CYANOCOBALAMIN) 100 MCG tablet 100 mcg.     vitamin C (ASCORBIC ACID) 500 MG tablet Take 500 mg by mouth daily.     aspirin EC 81 MG tablet Take 1 tablet (81 mg total) by mouth 2 (two) times daily after a meal. Day after surgery 60 tablet 1   cyanocobalamin (VITAMIN B12) 1000 MCG tablet Take 1,000 mcg by mouth daily.     No current facility-administered medications for this visit.    Social History   Socioeconomic History   Marital status: Married    Spouse name: Not on file   Number of children: 2   Years of education: Not on file   Highest education level: Not on file  Occupational History   Occupation: executive recruter   Tobacco Use   Smoking status: Former    Types: Cigars    Quit date: 09/2020    Years since quitting: 2.4   Smokeless tobacco: Never  Vaping Use   Vaping status: Never Used  Substance and Sexual Activity   Alcohol  use: Not Currently    Alcohol/week: 21.0 standard drinks of alcohol    Types: 21 Glasses of wine per week    Comment: Quit in January of 2024.   Drug use: No   Sexual activity: Yes  Other Topics Concern   Not  on file  Social History Narrative   Lives w/ wife      Right Handed    Lives in a two story home   Drinks two cups of coffee daily   retired      International aid/development worker of [employer] Strain: Low Risk  (08/21/2022)   Overall Financial Resource Strain (CARDIA)    Difficulty of Paying Living Expenses: Not hard at all  Food Insecurity: No Food Insecurity (08/21/2022)   Hunger Vital Sign    Worried About Running Out of Food in the Last Year: Never true    Ran Out of Food in the Last Year: Never true  Transportation Needs: No Transportation Needs (08/16/2022)   PRAPARE - Administrator, Civil Service (Medical): No    Lack of Transportation (Non-Medical): No  Physical Activity: Not on file  Stress: Not on file  Social Connections: Not on file  Intimate Partner Violence: Not At Risk (08/16/2022)   Humiliation, Afraid, Rape, and Kick questionnaire    Fear of Current or Ex-Partner: No    Emotionally Abused: No    Physically Abused: No    Sexually Abused: No   Social history is notable in that he is married and has 2 children. He now has a dog and walks the dog 1 mile 2 times a day. He is no longer using his treadmill. There is no tobacco use. He does drink alcohol.  Family History  Problem Relation Age of Onset   Stroke Mother        TIA   COPD Mother    Hypertension Sister    Prostate cancer Father 54   Coronary artery disease Father        GM   Diabetes Brother    Prostate cancer Brother 44   Diabetes Maternal Grandmother    Colon cancer Neg Hx    Esophageal cancer Neg Hx    Colon polyps Neg Hx    Rectal cancer Neg Hx    Stomach cancer Neg Hx     ROS General: Negative; No fevers, chills, or night sweats;  Positive for previous purposeful  weight loss of 25 pounds. HEENT: Negative; No changes in vision or hearing, sinus congestion, difficulty swallowing Pulmonary: Negative; No cough, wheezing, shortness of breath, hemoptysis Cardiovascular: See HPI GI: Negative; No nausea, vomiting, diarrhea, or abdominal pain GU: Mild erectile dysfunction; No dysuria, hematuria, or difficulty voiding Musculoskeletal: Left TKR April 2024 Hematologic/Oncology: Negative; no easy bruising, bleeding Endocrine: Negative; no heat/cold intolerance; no diabetes Neuro: Mild short-term memory loss; no changes in balance, headaches Skin: Negative; No rashes or skin lesions Psychiatric: Mild depression/anxiety Sleep: OSA on CPAP  with significant improvement in his prior fatigability and snoring; no bruxism, restless legs, hypnogognic hallucinations, no cataplexy  Other comprehensive 14 point system review is negative.   PE BP (!) 164/94   Pulse (!) 44   Ht 5\' 10"  (1.778 m)   Wt 199 lb 3.2 oz (90.4 kg)   SpO2 94%   BMI 28.58 kg/m    Repeat blood pressure by me was 190/74 supine and 182/76 standing  Wt Readings from Last 3 Encounters:  02/02/23 199 lb 3.2 oz (90.4 kg)  12/31/22 194 lb (88 kg)  11/24/22 198 lb (89.8 kg)   General: Alert, oriented, no distress.  Skin: normal turgor, no rashes, warm and dry HEENT: Normocephalic, atraumatic. Pupils equal round and reactive to light; sclera anicteric; extraocular muscles intact; Nose without nasal septal hypertrophy  Mouth/Parynx benign; Mallinpatti scale 3 Neck: No JVD, no carotid bruits; normal carotid upstroke Lungs: clear to ausculatation and percussion; no wheezing or rales Chest wall: without tenderness to palpitation Heart: PMI not displaced, RRR, s1 s2 normal, 1/6 systolic murmur, no diastolic murmur, no rubs, gallops, thrills, or heaves Abdomen: soft, nontender; no hepatosplenomehaly, BS+; abdominal aorta nontender and not dilated by palpation. Back: no CVA tenderness Pulses  2+ Musculoskeletal: full range of motion, normal strength, no joint deformities Extremities: no clubbing cyanosis or edema, Homan's sign negative  Neurologic: grossly nonfocal; Cranial nerves grossly wnl Psychologic: Normal mood and affect   EKG Interpretation Date/Time:  Monday February 02 2023 16:36:47 EDT Ventricular Rate:  44 PR Interval:  142 QRS Duration:  112 QT Interval:  502 QTC Calculation: 429 R Axis:   55  Text Interpretation: Marked sinus bradycardia When compared with ECG of 15-Aug-2022 19:10, Premature ventricular complexes are no longer Present Vent. rate has decreased BY  21 BPM Confirmed by Nicki Guadalajara ([id number]) on 02/02/2023 6:06:48 PM    November 20, 2020 ECG (independently read by me): Sinus Bradycardia at 48; no ectopy; normal intervals  May 08, 2020 ECG (independently read by me): Sinus bradycardia 52 bpm.  No ectopy.  Normal intervals  August 30,2021 ECG (independently read by me):Sinus Bradycardia at 54; no ectopy, normal intervals  May 2021 ECG (independently read by me): Sinus bradycardia 56 bpm.  No ectopy.  QTc interval 467 ms  August 2020 ECG (independently read by me): Normal sinus rhythm at 61 bpm.  Sep 06, 2018 ECG (independently read by me): Marked sinus bradycardia at 43 bpm.  No ST segment changes.  PR interval 148 ms, QTc interval 419 ms.  March 2019 ECG (independently read by me): sinus bradycardia 51 bpm.  No ectopy.  Normal intervals.  March 2018 ECG (independently read by me): Sinus bradycardia 52 bpm.  Small nondiagnostic inferior Q waves.  Normal intervals  November 2016 ECG (independently read by me):  Sinus bradycardia at 49 bpm.  Small inferior Q waves with preserved R waves.  February 2016 ECG (independently read by me): Sinus bradycardia 46 bpm.  No ectopy.  February 2015 ECG (independently read by me): Normal sinus rhythm at 62 beats per minute. Small nondiagnostic inferior Q waves. Observed R waves.  LABS:     Latest Ref Rng &  Units 09/01/2022   10:05 AM 08/25/2022   10:40 AM 08/19/2022    2:10 AM  BMP  Glucose 70 - 99 mg/dL 604  540  981   BUN 8 - 27 mg/dL 14  14  15    Creatinine 0.76 - 1.27 mg/dL 1.91  4.78  2.95   BUN/Creat Ratio 10 - 24 13  13     Sodium 134 - 144 mmol/L 137  137  133   Potassium 3.5 - 5.2 mmol/L 5.3  5.8  3.8   Chloride 96 - 106 mmol/L 99  99  97   CO2 20 - 29 mmol/L 21  22  28    Calcium 8.6 - 10.2 mg/dL 9.4  9.9  8.7       Component Value Date/Time   PROT 6.7 11/24/2022 1630   PROT 6.8 04/25/2020 0854   ALBUMIN 2.4 (L) 08/17/2022 0123   ALBUMIN 4.3 04/25/2020 0854   AST 24 08/17/2022 0123   ALT 18 08/17/2022 0123   ALKPHOS 51 08/17/2022 0123   BILITOT 1.2 08/17/2022 0123   BILITOT 0.7 04/25/2020 0854   BILIDIR 0.1 08/03/2020 1031  Latest Ref Rng & Units 08/19/2022    2:10 AM 08/18/2022    2:10 AM 08/17/2022    1:23 AM  CBC  WBC 4.0 - 10.5 K/uL 7.6  8.6  9.9   Hemoglobin 13.0 - 17.0 g/dL 40.9  81.1  91.4   Hematocrit 39.0 - 52.0 % 32.1  30.9  29.6   Platelets 150 - 400 K/uL 302  252  212    Lab Results  Component Value Date   MCV 90.7 08/19/2022   MCV 92.8 08/18/2022   MCV 91.6 08/17/2022   Lab Results  Component Value Date   TSH 2.28 11/24/2022   Lab Results  Component Value Date   HGBA1C 6.5 (H) 08/06/2022    BNP No results found for: "PROBNP"  Lipid Panel     Component Value Date/Time   CHOL 95 10/22/2022 0958   CHOL 157 04/25/2020 0854   TRIG 139.0 10/22/2022 0958   HDL 42.30 10/22/2022 0958   HDL 77 04/25/2020 0854   CHOLHDL 2 10/22/2022 0958   VLDL 27.8 10/22/2022 0958   LDLCALC 25 10/22/2022 0958   LDLCALC 54 04/25/2020 0854    RADIOLOGY: No results found.  IMPRESSION:  1. Atherosclerosis of native coronary artery of native heart without angina pectoris   2. Primary hypertension   3. OSA (obstructive sleep apnea) on CPAP   4. Hyperlipidemia LDL goal <70   5. Grade I diastolic dysfunction   6. Sinus bradycardia     ASSESSMENT AND  PLAN:  Dakota Gibbs is a 72 year old Caucasian male who is 27 years status post his inferior wall myocardial infarction at which time he underwent acute intervention to a totally occluded RCA and staged intervention to circumflex marginal vessel in January 1997. His last catheterization in 2001 showed plaque regression on his aggressive medical regimen.  He has been aggressively treated and has remained stable without recurrent anginal symptoms.  A Holter monitor obtained in September 2020 revealed predominant sinus rhythm with occasional isolated PVCs although there was one 4 beat episode of multifocal ventricular tachycardia.  His beta-blocker dose had been reduced because of bradycardia with heart rates in the 40s.  An echo Doppler studies demonstrated normal systolic function with EF of 60 to 65%.  Due to significant complaints of fatigability and being tired all the time, he ultimately had a sleep study which confirmed severe sleep apnea with an AHI of 39.2/h, RDI 57.4.  AHI while supine was very severe at 58.1 and REM sleep AHI was 30.0.  O2 nadir was 85%.  He has been on CPAP therapy since April 12, 2020.  At his prior evaluations adjustments were made to his CPAP unit.  He underwent a follow-up echo Doppler study following a COVID-19 infection this continue to show normal LV systolic function EF of 55 to 78% and grade 1 diastolic dysfunction.  His blood pressure today is relatively stable.  When last seen by me in 2022 presyncope/syncope was reviewed and I suspect this may have been in the setting of significant dehydration during extreme heat working outside for some time in the yard and digging up plants.  He underwent left knee total replacement surgery in April 2024 and was hospitalized several days later with fever.  CT was negative for PE or infiltrate.  He was treated with IV vancomycin and Zosyn and reassessed by orthopedics.  Presently, his blood pressure is elevated today and he is  bradycardic.  He has been on olmesartan 40 mg daily  for blood pressure control remotely he had been on a diuretic and is not on presently.  I have elected to add amlodipine 5 mg to his medical regimen for improved blood pressure.  Most recently he is on Zetia and atorvastatin 40 mg.  With his LDL of 25 I suggest that he can slightly really reduce atorvastatin to 20 mg but my target would be an LDL less than 50 and if LDL increases increased atorvastatin will be recommended.  He is diabetic on metformin.  He has been documented to have neuropathy with a pinched nerve at L5.  He will monitor his blood pressure and heart rate.  He is not on any beta-blocker and is bradycardic.  He is unaware of any recurrent SVT episodes.  He continues to be on CPAP therapy with settings now at 9 to 15 cm of water 95th percentile pressure 12.2 with maximum average pressure 13.2.  I have recommended he undergo a pharmacy evaluation of his blood pressure controlled in approximately 4 to 6 weeks.  I will see him in 6 months for reevaluation or sooner as needed.  Lennette Bihari, MD, [facility]  02/08/2023 11:57 AM

## 2023-02-03 DIAGNOSIS — E119 Type 2 diabetes mellitus without complications: Secondary | ICD-10-CM | POA: Diagnosis not present

## 2023-02-08 ENCOUNTER — Encounter: Payer: Self-pay | Admitting: Cardiovascular Disease

## 2023-02-11 DIAGNOSIS — M792 Neuralgia and neuritis, unspecified: Secondary | ICD-10-CM | POA: Diagnosis not present

## 2023-02-11 DIAGNOSIS — M961 Postlaminectomy syndrome, not elsewhere classified: Secondary | ICD-10-CM | POA: Diagnosis not present

## 2023-02-11 DIAGNOSIS — M48062 Spinal stenosis, lumbar region with neurogenic claudication: Secondary | ICD-10-CM | POA: Diagnosis not present

## 2023-02-11 DIAGNOSIS — M5451 Vertebrogenic low back pain: Secondary | ICD-10-CM | POA: Diagnosis not present

## 2023-02-16 ENCOUNTER — Ambulatory Visit (HOSPITAL_BASED_OUTPATIENT_CLINIC_OR_DEPARTMENT_OTHER): Payer: Medicare Other | Admitting: Physical Therapy

## 2023-02-18 DIAGNOSIS — M5416 Radiculopathy, lumbar region: Secondary | ICD-10-CM | POA: Diagnosis not present

## 2023-02-19 ENCOUNTER — Encounter: Payer: Self-pay | Admitting: Physician Assistant

## 2023-02-19 ENCOUNTER — Telehealth: Payer: Self-pay | Admitting: Neurology

## 2023-02-19 NOTE — Telephone Encounter (Signed)
Patient called to get a copy of EMG results to take too a neurosurgeon

## 2023-02-20 NOTE — Telephone Encounter (Signed)
Printed off, can you call and see if he is picking up or fax it please

## 2023-02-23 DIAGNOSIS — G4733 Obstructive sleep apnea (adult) (pediatric): Secondary | ICD-10-CM | POA: Diagnosis not present

## 2023-02-26 DIAGNOSIS — Z6829 Body mass index (BMI) 29.0-29.9, adult: Secondary | ICD-10-CM | POA: Diagnosis not present

## 2023-02-26 DIAGNOSIS — M5416 Radiculopathy, lumbar region: Secondary | ICD-10-CM | POA: Diagnosis not present

## 2023-03-05 ENCOUNTER — Telehealth: Payer: Self-pay

## 2023-03-05 DIAGNOSIS — M5416 Radiculopathy, lumbar region: Secondary | ICD-10-CM | POA: Diagnosis not present

## 2023-03-05 DIAGNOSIS — Z6829 Body mass index (BMI) 29.0-29.9, adult: Secondary | ICD-10-CM | POA: Diagnosis not present

## 2023-03-05 NOTE — Telephone Encounter (Signed)
Pre-operative Risk Assessment    Patient Name: Dakota Gibbs  DOB: 21-Dec-1950 MRN: [id number]   Last ov:02/02/2023  Upcoming visit:Unknown      Request for Surgical Clearance    Procedure:   L3-3 Lumbar Fusion   Date of Surgery:  Clearance TBD                                 Surgeon:  Tia Alert  Surgeon's Group or Practice Name:  [facility]  Phone number:  [phone] Fax number:  [phone]   Type of Clearance Requested:   - Medical  - Pharmacy:  Hold Aspirin Not indicated    Type of Anesthesia:  General    Additional requests/questions:    [name]   03/05/2023, 4:12 PM

## 2023-03-06 DIAGNOSIS — E119 Type 2 diabetes mellitus without complications: Secondary | ICD-10-CM | POA: Diagnosis not present

## 2023-03-06 NOTE — Telephone Encounter (Signed)
Dr. Tresa Endo,   You saw this patient on 02/02/2023. Per office protocol, will you please comment on medical clearance for L3-4 lumbar fusion?  Please route your response to P CV DIV Preop. I will communicate with requesting office once you have given recommendations.   Thank you!  Carlos Levering, NP

## 2023-03-08 ENCOUNTER — Encounter: Payer: Self-pay | Admitting: Cardiovascular Disease

## 2023-03-08 NOTE — Telephone Encounter (Signed)
OK for lumbar surgey clearance; When last evaluated BP was elevated, and amlodipine was added. Make sure BP has improved

## 2023-03-09 ENCOUNTER — Ambulatory Visit: Payer: Medicare Other | Attending: Cardiology | Admitting: Pharmacist Clinician (PhC)/ Clinical Pharmacy Specialist

## 2023-03-09 ENCOUNTER — Encounter: Payer: Self-pay | Admitting: Pharmacist Clinician (PhC)/ Clinical Pharmacy Specialist

## 2023-03-09 ENCOUNTER — Telehealth: Payer: Self-pay | Admitting: Cardiovascular Disease

## 2023-03-09 ENCOUNTER — Other Ambulatory Visit: Payer: Self-pay | Admitting: Neurological Surgery

## 2023-03-09 VITALS — BP 149/74 | HR 52

## 2023-03-09 DIAGNOSIS — I10 Essential (primary) hypertension: Secondary | ICD-10-CM

## 2023-03-09 MED ORDER — CHLORTHALIDONE 25 MG PO TABS: 12.5000 mg | ORAL_TABLET | Freq: Every day | ORAL | 3 refills | Status: DC

## 2023-03-09 NOTE — Telephone Encounter (Signed)
Patient stopped by front desk this morning states a surgical clearance was sent over last week Wanting to know if it had been received/viewed/signed off on yet Asking for call back with any updates

## 2023-03-09 NOTE — Assessment & Plan Note (Signed)
Assessment: BP is uncontrolled in office BP 149/74 mmHg;  above the goal (<130/80). Tolerates amlodipine and olmesartan well without any side effects Denies SOB, palpitation, chest pain, headaches,or swelling Reiterated the importance of regular exercise and low salt diet  Unsure how much change in blood pressure related to chronic back pain  Plan:  Start taking chlorthalidone 12.5 mg once daily in the mornings Continue taking olmesartan and amlodipine Patient to keep record of BP readings with heart rate and report to [place] at the next visit Patient to follow up with PharmD in 2 months  Labs ordered today:  BMET in 10-14 days. Is trying to schedule back surgery - will watch for pre-procedure labs in coming week

## 2023-03-09 NOTE — Progress Notes (Signed)
Office Visit    Patient Name: Dakota Gibbs Date of Encounter: 03/09/2023  Primary Care Provider:  Mliss Sax, MD Primary Cardiologist:  Nicki Guadalajara, MD  Chief Complaint    Hypertension  Significant Past Medical History   HLD 6/24 LDL 25 on atorvastatin 20, ezetimibe 10  Sinus bradycardia Avoid rate controlling medications  CAD 1997 MI, totally occluded RCA, stent to Cx, disese regression noted in 2001  DM2 4/24 A1c 6.5 on metformin  OSA On CPAP    Allergies  Allergen Reactions   Hydrocodone Anxiety, Other (See Comments), Nausea And Vomiting, Nausea Only and Shortness Of Breath   Oxycodone     History of Present Illness    Dakota Gibbs is a 72 y.o. male patient of Dr Tresa Endo, in the office today for hypertension evaluation.   He saw Dr. Tresa Endo at the end of September, at which time his BP was elevated at 164/94.  Dr. Tresa Endo added amlodipine to his regimen and he is in the office today for follow up.   He is needing back surgery for a pinched nerve and neuropathy, and hoping to get that scheduled for sometime this month.  Since adding the amlodipine he has not noticed much improvement in his blood pressure readings.    Blood Pressure Goal:  130/80  Current Medications:  amlodipine 5 mg every day, olmesartan 40 mg every day  (evening)  Previously tried:   lisinopril - states it was changed during hospitalization  Family Hx:  father had hypertension, heart disease , mgm had heart disease; mother had TIA's; 1 biological child - healthy   Social Hx:      Tobacco: cigar once weekly  Alcohol: wine daily  Caffeine:  coffee one cup daily (home brewed)  Diet:   mostly home cooked meals, does add salt to some foods; variety of protein (no fish); some vegetables, f/f/c, doesn't snack much  Exercise:  none, putters around yard; scheduled for back surgery soon  Home BP readings:   home device, wrist 11-11 years old  Adherence Assessment  Do you ever forget  to take your medication? [] Yes [x] No  Do you ever skip doses due to side effects? [] Yes [x] No  Do you have trouble affording your medicines? [] Yes [x] No  Are you ever unable to pick up your medication due to transportation difficulties? [] Yes [x] No   Adherence strategy: 7 day pill minders (am, pm)   Accessory Clinical Findings    Lab Results  Component Value Date   CREATININE 1.04 09/01/2022   BUN 14 09/01/2022   NA 137 09/01/2022   K 5.3 (H) 09/01/2022   CL 99 09/01/2022   CO2 21 09/01/2022   Lab Results  Component Value Date   ALT 18 08/17/2022   AST 24 08/17/2022   ALKPHOS 51 08/17/2022   BILITOT 1.2 08/17/2022   Lab Results  Component Value Date   HGBA1C 6.5 (H) 08/06/2022    Home Medications    Current Outpatient Medications  Medication Sig Dispense Refill   amLODipine (NORVASC) 5 MG tablet Take 0.5 tablets (2.5 mg total) by mouth daily. 180 tablet 3   aspirin EC 81 MG tablet Take 81 mg by mouth daily. Swallow whole.     atorvastatin (LIPITOR) 20 MG tablet Take 1 tablet (20 mg total) by mouth daily. 90 tablet 3   cetirizine (ZYRTEC) 10 MG tablet Take 10 mg by mouth daily.     chlorthalidone (HYGROTON) 25 MG tablet Take 0.5 tablets (12.5  mg total) by mouth daily. 45 tablet 3   citalopram (CELEXA) 20 MG tablet ( GENERIC FOR CELEXA) TAKE ONE AND ONE-HALF TABLETS BY MOUTH DAILY 135 tablet 1   ezetimibe (ZETIA) 10 MG tablet TAKE ONE TABLET BY MOUTH DAILY (Patient taking differently: Take 10 mg by mouth at bedtime.) 90 tablet 3   metFORMIN (GLUCOPHAGE-XR) 500 MG 24 hr tablet TAKE ONE TABLET BY MOUTH EVERY DAY WITH BREAKFAST 90 tablet 1   Multiple Vitamin (MULTIVITAMIN) tablet Take 1 tablet by mouth daily.     nitroGLYCERIN (NITROSTAT) 0.4 MG SL tablet For chest pain, tightness, or pressure. While sitting, place 1 tablet under tongue. May be used every 5 minutes as needed, for up to 15 minutes. Do not use more than 3 tablets. 25 tablet 6   olmesartan (BENICAR) 40 MG  tablet Take 1 tablet (40 mg total) by mouth at bedtime. 90 tablet 1   pregabalin (LYRICA) 50 MG capsule Take 1 capsule (50 mg total) by mouth daily. (Patient taking differently: Take 50 mg by mouth 2 (two) times daily.) 30 capsule 3   pyridOXINE (VITAMIN B6) 100 MG tablet Take 100 mg by mouth daily.     traMADol (ULTRAM) 50 MG tablet Take 1-2 tablets by mouth every 6 (six) hours as needed for moderate pain or severe pain.     vitamin C (ASCORBIC ACID) 500 MG tablet Take 500 mg by mouth daily.     fluticasone (FLONASE) 50 MCG/ACT nasal spray Place 1 spray into both nostrils daily.     oxymetazoline (AFRIN) 0.05 % nasal spray Place 1 spray into both nostrils at bedtime.     trolamine salicylate (ASPERCREME) 10 % cream Apply 1 Application topically as needed for muscle pain.     No current facility-administered medications for this visit.     HYPERTENSION CONTROL Vitals:   03/09/23 0937 03/09/23 0942  BP: (!) 165/74 (!) 149/74    The patient's blood pressure is elevated above target today.  In order to address the patient's elevated BP: A new medication was prescribed today.      Assessment & Plan    Primary hypertension Assessment: BP is uncontrolled in office BP 149/74 mmHg;  above the goal (<130/80). Tolerates amlodipine and olmesartan well without any side effects Denies SOB, palpitation, chest pain, headaches,or swelling Reiterated the importance of regular exercise and low salt diet  Unsure how much change in blood pressure related to chronic back pain  Plan:  Start taking chlorthalidone 12.5 mg once daily in the mornings Continue taking olmesartan and amlodipine Patient to keep record of BP readings with heart rate and report to [place] at the next visit Patient to follow up with PharmD in 2 months  Labs ordered today:  BMET in 10-14 days. Is trying to schedule back surgery - will watch for pre-procedure labs in coming week   Phillips Hay PharmD CPP [facility]  [address] [place], [place] [address] [phone]

## 2023-03-09 NOTE — Patient Instructions (Signed)
Follow up appointment: Monday January 20 at 9:30  Go to the lab in 10-14 days to check potassium level and kidney function  Take your BP meds as follows:  Start chlorthalidone 12.5 mg once daily in the mornings.  (Take 1/2 of the 25 mg tablet)  Continue with olmesartan and amlodipine.   Check your blood pressure at home daily (if able) and keep record of the readings. Bring your home device to your next appointment.    Hypertension "High blood pressure"  Hypertension is often called "The Silent Killer." It rarely causes symptoms until it is extremely  high or has done damage to other organs in the body. For this reason, you should have your  blood pressure checked regularly by your physician. We will check your blood pressure  every time you see a provider at one of our offices.   Your blood pressure reading consists of two numbers. Ideally, blood pressure should be  below 120/80. The first ("top") number is called the systolic pressure. It measures the  pressure in your arteries as your heart beats. The second ("bottom") number is called the diastolic pressure. It measures the pressure in your arteries as the heart relaxes between beats.  The benefits of getting your blood pressure under control are enormous. A 10-point  reduction in systolic blood pressure can reduce your risk of stroke by 27% and heart failure by 28%  Your blood pressure goal is < 130/80  To check your pressure at home you will need to:  1. Sit up in a chair, with feet flat on the floor and back supported. Do not cross your ankles or legs. 2. Rest your left arm so that the cuff is about heart level. If the cuff goes on your upper arm,  then just relax the arm on the table, arm of the chair or your lap. If you have a wrist cuff, we  suggest relaxing your wrist against your chest (think of it as Pledging the Flag with the  wrong arm).  3. Place the cuff snugly around your arm, about 1 inch above the crook of  your elbow. The  cords should be inside the groove of your elbow.  4. Sit quietly, with the cuff in place, for about 5 minutes. After that 5 minutes press the power  button to start a reading. 5. Do not talk or move while the reading is taking place.  6. Record your readings on a sheet of paper. Although most cuffs have a memory, it is often  easier to see a pattern developing when the numbers are all in front of you.  7. You can repeat the reading after 1-3 minutes if it is recommended  Make sure your bladder is empty and you have not had caffeine or tobacco within the last 30 min  Always bring your blood pressure log with you to your appointments. If you have not brought your monitor in to be double checked for accuracy, please bring it to your next appointment.  You can find a list of quality blood pressure cuffs at validatebp.org

## 2023-03-09 NOTE — Telephone Encounter (Signed)
   Name: Dakota Gibbs  DOB: 1950/09/09  MRN: [id number]   Primary Cardiologist: Nicki Guadalajara, MD  Chart reviewed as part of pre-operative protocol coverage. Dakota Gibbs was last seen on 02/02/2023 by Dr. Tresa Endo.  Per Dr. Tresa Endo "OK for lumbar surgey clearance; When last evaluated BP was elevated, and amlodipine was added. Make sure BP has improved." Spoke with patient via telephone. He reports BP has come down ~10 mmHg with the addition of amlodipine. He is being followed closely by our Pharm D HTN clinic and will have another medication added to his regimen.   Therefore, based on ACC/AHA guidelines, the patient would be an acceptable risk for the planned procedure without further cardiovascular testing.   Per office protocol, he may hold aspirin for 7 days prior to procedure and should resume as soon as hemodynamically stable postoperatively.   I will route this recommendation to the requesting party via Epic fax function and remove from pre-op pool. Please call with questions.  Carlos Levering, NP 03/09/2023, 12:32 PM

## 2023-03-12 NOTE — Telephone Encounter (Signed)
Sounds great. Good luck on the surgery, hope it helps relieve the symptoms

## 2023-03-13 NOTE — Progress Notes (Signed)
Surgical Instructions   Your procedure is scheduled on Wednesday, March 18, 2023. Marland Kitchen Report to [facility] Main Entrance "A" at 8:00 A.M., then check in with the Admitting office. Any questions or running late day of surgery: call [phone]  Questions prior to your surgery date: call [phone], Monday-Friday, 8am-4pm. If you experience any cold or flu symptoms such as cough, fever, chills, shortness of breath, etc. between now and your scheduled surgery, please notify us at the above number.     Remember:  Do not eat or drink after midnight the night before your surgery   Take these medicines the morning of surgery with A SIP OF WATER  amLODipine (NORVASC)  atorvastatin (LIPITOR)  cetirizine (ZYRTEC)  citalopram (CELEXA)  oxymetazoline (AFRIN) 0.05 % nasal spray  pregabalin (LYRICA)    May take these medicines IF NEEDED: nitroGLYCERIN (NITROSTAT).  If you need to use this medication, please give [place] a call at [phone].   traMADol (ULTRAM)    PER YOUR SURGEON'S INSTRUCTIONS, HOLD YOUR aspirin  7 DAYS PRIOR TO SURGERY, WITH THE LAST DOSE BEING 03/10/2023.   One week prior to surgery, STOP taking any Aspirin (unless otherwise instructed by your surgeon) Aleve, Naproxen, Ibuprofen, Motrin, Advil, Goody's, BC's, all herbal medications, fish oil, and non-prescription vitamins.   WHAT DO I DO ABOUT MY DIABETES MEDICATION?   Do not take oral diabetes medicines (pills) the morning of surgery.        DO NOT TAKE YOUR metFORMIN (GLUCOPHAGE-XR) THE MORNING OF SURGERY.     The day of surgery, do not take other diabetes injectables, including Byetta (exenatide), Bydureon (exenatide ER), Victoza (liraglutide), or Trulicity (dulaglutide).  If your CBG is greater than 220 mg/dL, you may take  of your sliding scale (correction) dose of insulin.   HOW TO MANAGE YOUR DIABETES BEFORE AND AFTER SURGERY  Why is it important to control my blood sugar before and after  surgery? Improving blood sugar levels before and after surgery helps healing and can limit problems. A way of improving blood sugar control is eating a healthy diet by:  Eating less sugar and carbohydrates  Increasing activity/exercise  Talking with your doctor about reaching your blood sugar goals High blood sugars (greater than 180 mg/dL) can raise your risk of infections and slow your recovery, so you will need to focus on controlling your diabetes during the weeks before surgery. Make sure that the doctor who takes care of your diabetes knows about your planned surgery including the date and location.  How do I manage my blood sugar before surgery? Check your blood sugar at least 4 times a day, starting 2 days before surgery, to make sure that the level is not too high or low.  Check your blood sugar the morning of your surgery when you wake up and every 2 hours until you get to the Short Stay unit.  If your blood sugar is less than 70 mg/dL, you will need to treat for low blood sugar: Do not take insulin. Treat a low blood sugar (less than 70 mg/dL) with  cup of clear juice (cranberry or apple), 4 glucose tablets, OR glucose gel. Recheck blood sugar in 15 minutes after treatment (to make sure it is greater than 70 mg/dL). If your blood sugar is not greater than 70 mg/dL on recheck, call [phone] for further instructions. Report your blood sugar to the short stay nurse when you get to Short Stay.  If you are admitted to the hospital after  surgery: Your blood sugar will be checked by the staff and you will probably be given insulin after surgery (instead of oral diabetes medicines) to make sure you have good blood sugar levels. The goal for blood sugar control after surgery is 80-180 mg/dL.                      Do NOT Smoke (Tobacco/Vaping) for 24 hours prior to your procedure.  If you use a CPAP at night, you may bring your mask/headgear for your overnight stay.   You will be  asked to remove any contacts, glasses, piercing's, hearing aid's, dentures/partials prior to surgery. Please bring cases for these items if needed.    Patients discharged the day of surgery will not be allowed to drive home, and someone needs to stay with them for 24 hours.  SURGICAL WAITING ROOM VISITATION Patients may have no more than 2 support people in the waiting area - these visitors may rotate.   Pre-op nurse will coordinate an appropriate time for 1 ADULT support person, who may not rotate, to accompany patient in pre-op.  Children under the age of 28 must have an adult with them who is not the patient and must remain in the main waiting area with an adult.  If the patient needs to stay at the hospital during part of their recovery, the visitor guidelines for inpatient rooms apply.  Please refer to the [facility] website for the visitor guidelines for any additional information.   If you received a COVID test during your pre-op visit  it is requested that you wear a mask when out in public, stay away from anyone that may not be feeling well and notify your surgeon if you develop symptoms. If you have been in contact with anyone that has tested positive in the last 10 days please notify you surgeon.      Pre-operative 5 CHG Bathing Instructions   You can play a key role in reducing the risk of infection after surgery. Your skin needs to be as free of germs as possible. You can reduce the number of germs on your skin by washing with CHG (chlorhexidine gluconate) soap before surgery. CHG is an antiseptic soap that kills germs and continues to kill germs even after washing.   DO NOT use if you have an allergy to chlorhexidine/CHG or antibacterial soaps. If your skin becomes reddened or irritated, stop using the CHG and notify one of our RNs at [phone].   Please shower with the CHG soap starting 4 days before surgery using the following schedule:     Please keep in mind the  following:  DO NOT shave, including legs and underarms, starting the day of your first shower.   You may shave your face at any point before/day of surgery.  Place clean sheets on your bed the day you start using CHG soap. Use a clean washcloth (not used since being washed) for each shower. DO NOT sleep with pets once you start using the CHG.   CHG Shower Instructions:  Wash your face and private area with normal soap. If you choose to wash your hair, wash first with your normal shampoo.  After you use shampoo/soap, rinse your hair and body thoroughly to remove shampoo/soap residue.  Turn the water OFF and apply about 3 tablespoons (45 ml) of CHG soap to a CLEAN washcloth.  Apply CHG soap ONLY FROM YOUR NECK DOWN TO YOUR TOES (washing for 3-5 minutes)  DO NOT use CHG soap on face, private areas, open wounds, or sores.  Pay special attention to the area where your surgery is being performed.  If you are having back surgery, having someone wash your back for you may be helpful. Wait 2 minutes after CHG soap is applied, then you may rinse off the CHG soap.  Pat dry with a clean towel  Put on clean clothes/pajamas   If you choose to wear lotion, please use ONLY the CHG-compatible lotions on the back of this paper.   Additional instructions for the day of surgery: DO NOT APPLY any lotions, deodorants OR cologne.   Do not bring valuables to the hospital. [facility] is not responsible for any belongings/valuables. Do not wear jewelry Put on clean/comfortable clothes.  Please brush your teeth.  Ask your nurse before applying any prescription medications to the skin.     CHG Compatible Lotions   Aveeno Moisturizing lotion  Cetaphil Moisturizing Cream  Cetaphil Moisturizing Lotion  Clairol Herbal Essence Moisturizing Lotion, Dry Skin  Clairol Herbal Essence Moisturizing Lotion, Extra Dry Skin  Clairol Herbal Essence Moisturizing Lotion, Normal Skin  Curel Age Defying Therapeutic  Moisturizing Lotion with Alpha Hydroxy  Curel Extreme Care Body Lotion  Curel Soothing Hands Moisturizing Hand Lotion  Curel Therapeutic Moisturizing Cream, Fragrance-Free  Curel Therapeutic Moisturizing Lotion, Fragrance-Free  Curel Therapeutic Moisturizing Lotion, Original Formula  Eucerin Daily Replenishing Lotion  Eucerin Dry Skin Therapy Plus Alpha Hydroxy Crme  Eucerin Dry Skin Therapy Plus Alpha Hydroxy Lotion  Eucerin Original Crme  Eucerin Original Lotion  Eucerin Plus Crme Eucerin Plus Lotion  Eucerin TriLipid Replenishing Lotion  Keri Anti-Bacterial Hand Lotion  Keri Deep Conditioning Original Lotion Dry Skin Formula Softly Scented  Keri Deep Conditioning Original Lotion, Fragrance Free Sensitive Skin Formula  Keri Lotion Fast Absorbing Fragrance Free Sensitive Skin Formula  Keri Lotion Fast Absorbing Softly Scented Dry Skin Formula  Keri Original Lotion  Keri Skin Renewal Lotion Keri Silky Smooth Lotion  Keri Silky Smooth Sensitive Skin Lotion  Nivea Body Creamy Conditioning Oil  Nivea Body Extra Enriched Lotion  Nivea Body Original Lotion  Nivea Body Sheer Moisturizing Lotion Nivea Crme  Nivea Skin Firming Lotion  NutraDerm 30 Skin Lotion  NutraDerm Skin Lotion  NutraDerm Therapeutic Skin Cream  NutraDerm Therapeutic Skin Lotion  ProShield Protective Hand Cream  Provon moisturizing lotion  Please read over the following fact sheets that you were given.

## 2023-03-14 ENCOUNTER — Other Ambulatory Visit: Payer: Self-pay | Admitting: Family Medicine

## 2023-03-14 DIAGNOSIS — E1142 Type 2 diabetes mellitus with diabetic polyneuropathy: Secondary | ICD-10-CM

## 2023-03-16 ENCOUNTER — Telehealth: Payer: Self-pay | Admitting: Family Medicine

## 2023-03-16 ENCOUNTER — Encounter (HOSPITAL_COMMUNITY): Payer: Self-pay

## 2023-03-16 ENCOUNTER — Encounter (HOSPITAL_COMMUNITY)
Admission: RE | Admit: 2023-03-16 | Discharge: 2023-03-16 | Disposition: A | Discharge status: Home / Self Care | Payer: Medicare Other | Source: Ambulatory Visit | Attending: Neurological Surgery | Admitting: Neurological Surgery

## 2023-03-16 VITALS — BP 146/43 | HR 64 | Temp 98.0°F | Resp 18 | Ht 70.0 in | Wt 202.6 lb

## 2023-03-16 DIAGNOSIS — R001 Bradycardia, unspecified: Secondary | ICD-10-CM | POA: Diagnosis not present

## 2023-03-16 DIAGNOSIS — E119 Type 2 diabetes mellitus without complications: Secondary | ICD-10-CM | POA: Insufficient documentation

## 2023-03-16 DIAGNOSIS — I5030 Unspecified diastolic (congestive) heart failure: Secondary | ICD-10-CM | POA: Insufficient documentation

## 2023-03-16 DIAGNOSIS — E785 Hyperlipidemia, unspecified: Secondary | ICD-10-CM | POA: Insufficient documentation

## 2023-03-16 DIAGNOSIS — I110 Hypertensive heart disease with heart failure: Secondary | ICD-10-CM | POA: Diagnosis not present

## 2023-03-16 DIAGNOSIS — G4733 Obstructive sleep apnea (adult) (pediatric): Secondary | ICD-10-CM | POA: Diagnosis not present

## 2023-03-16 DIAGNOSIS — I2510 Atherosclerotic heart disease of native coronary artery without angina pectoris: Secondary | ICD-10-CM | POA: Insufficient documentation

## 2023-03-16 DIAGNOSIS — Z01812 Encounter for preprocedural laboratory examination: Secondary | ICD-10-CM | POA: Insufficient documentation

## 2023-03-16 DIAGNOSIS — Z9861 Coronary angioplasty status: Secondary | ICD-10-CM | POA: Diagnosis not present

## 2023-03-16 DIAGNOSIS — D649 Anemia, unspecified: Secondary | ICD-10-CM | POA: Diagnosis not present

## 2023-03-16 DIAGNOSIS — I252 Old myocardial infarction: Secondary | ICD-10-CM | POA: Diagnosis not present

## 2023-03-16 DIAGNOSIS — Z79899 Other long term (current) drug therapy: Secondary | ICD-10-CM | POA: Insufficient documentation

## 2023-03-16 DIAGNOSIS — Z01818 Encounter for other preprocedural examination: Secondary | ICD-10-CM

## 2023-03-16 HISTORY — DX: Polyneuropathy, unspecified: G62.9

## 2023-03-16 HISTORY — DX: Dyspnea, unspecified: R06.00

## 2023-03-16 LAB — PROTIME-INR
INR: 1.1 (ref 0.8–1.2)
Prothrombin Time: 13.9 s (ref 11.4–15.2)

## 2023-03-16 LAB — HEMOGLOBIN A1C
Hgb A1c MFr Bld: 6.1 % — ABNORMAL HIGH (ref 4.8–5.6)
Mean Plasma Glucose: 128.37 mg/dL

## 2023-03-16 LAB — CBC
HCT: 37.7 % — ABNORMAL LOW (ref 39.0–52.0)
Hemoglobin: 12.6 g/dL — ABNORMAL LOW (ref 13.0–17.0)
MCH: 31.3 pg (ref 26.0–34.0)
MCHC: 33.4 g/dL (ref 30.0–36.0)
MCV: 93.5 fL (ref 80.0–100.0)
Platelets: 221 10*3/uL (ref 150–400)
RBC: 4.03 MIL/uL — ABNORMAL LOW (ref 4.22–5.81)
RDW: 14.6 % (ref 11.5–15.5)
WBC: 8.1 10*3/uL (ref 4.0–10.5)
nRBC: 0 % (ref 0.0–0.2)

## 2023-03-16 LAB — BASIC METABOLIC PANEL
Anion gap: 11 (ref 5–15)
BUN: 25 mg/dL — ABNORMAL HIGH (ref 8–23)
CO2: 26 mmol/L (ref 22–32)
Calcium: 9 mg/dL (ref 8.9–10.3)
Chloride: 105 mmol/L (ref 98–111)
Creatinine, Ser: 0.93 mg/dL (ref 0.61–1.24)
GFR, Estimated: >60 mL/min (ref >60)
Glucose, Bld: 115 mg/dL — ABNORMAL HIGH (ref 70–99)
Potassium: 4.3 mmol/L (ref 3.5–5.1)
Sodium: 142 mmol/L (ref 135–145)

## 2023-03-16 LAB — GLUCOSE, CAPILLARY: Glucose-Capillary: 158 mg/dL — ABNORMAL HIGH (ref 70–99)

## 2023-03-16 LAB — TYPE AND SCREEN
ABO/RH(D): A NEG
Antibody Screen: NEGATIVE

## 2023-03-16 LAB — SURGICAL PCR SCREEN
MRSA, PCR: NEGATIVE
Staphylococcus aureus: NEGATIVE

## 2023-03-16 NOTE — Telephone Encounter (Signed)
Pt has an appt tomorrow at 1:40 does this need to be a fasting lab for his metformin refill or does he even need an appt.   metFORMIN (GLUCOPHAGE-XR) 500 MG 24 hr tablet [[id number]]    [facility] - [place], [place] - [address] [place], [place] [address] Phone: [phone]  Fax: [phone]

## 2023-03-16 NOTE — Telephone Encounter (Signed)
Pt had his labs done at another office. He thinks you can see the results.

## 2023-03-16 NOTE — Progress Notes (Signed)
PCP - Dr. Doreene Burke Cardiologist - Dr. Tresa Endo    -clearance note on 03/09/23  PPM/ICD - denies Device Orders - n/a Rep Notified - n/a  Chest x-ray - 08/15/22 EKG - 02/02/23 Stress Test - 09/03/22 ECHO - 08/16/22 Cardiac Cath - 10/04/99  Sleep Study - OSA+ CPAP - wears nightly but does not know settings  Fasting Blood Sugar - 113 Checks Blood Sugar __1___ times a day      Aspirin Instructions: last dose was 03/11/23   COVID TEST- n/a   Anesthesia review: yes- CAD, HTN, MI, OSA  Patient denies shortness of breath, fever, cough and chest pain at PAT appointment   All instructions explained to the patient, with a verbal understanding of the material. Patient agrees to go over the instructions while at home for a better understanding. Patient also instructed to self quarantine after being tested for COVID-19. The opportunity to ask questions was provided.

## 2023-03-17 ENCOUNTER — Ambulatory Visit (INDEPENDENT_AMBULATORY_CARE_PROVIDER_SITE_OTHER): Payer: Medicare Other | Admitting: Family Medicine

## 2023-03-17 ENCOUNTER — Encounter: Payer: Self-pay | Admitting: Family Medicine

## 2023-03-17 VITALS — BP 116/68 | HR 61 | Temp 98.0°F | Ht 70.0 in | Wt 201.6 lb

## 2023-03-17 DIAGNOSIS — R7303 Prediabetes: Secondary | ICD-10-CM

## 2023-03-17 DIAGNOSIS — D539 Nutritional anemia, unspecified: Secondary | ICD-10-CM | POA: Insufficient documentation

## 2023-03-17 DIAGNOSIS — F32A Depression, unspecified: Secondary | ICD-10-CM | POA: Diagnosis not present

## 2023-03-17 DIAGNOSIS — F419 Anxiety disorder, unspecified: Secondary | ICD-10-CM

## 2023-03-17 MED ORDER — METFORMIN HCL ER 500 MG PO TB24: ORAL_TABLET | ORAL | 1 refills | Status: DC

## 2023-03-17 NOTE — Progress Notes (Signed)
Anesthesia Chart Review:  72 year old male follows with cardiology for history of HTN, HLD, HFpEF, sinus bradycardia, OSA on CPAP, CAD s/p MI with PCI to totally occluded RCA and staged intervention to circumflex marginal vessel in 1997.  Echo 08/2022 showed EF 7075%, grade 1 DD, severely dilated left atrium, no significant valvular abnormalities.  Nuclear stress 09/03/2022 showed findings consistent with prior infarct, no current ischemia, low risk.  Cardiac clearance per telephone encounter 03/09/2023, "Chart reviewed as part of pre-operative protocol coverage. Dakota Gibbs was last seen on 02/02/2023 by Dr. Tresa Endo.  Per Dr. Tresa Endo "OK for lumbar surgey clearance; When last evaluated BP was elevated, and amlodipine was added. Make sure BP has improved." Spoke with patient via telephone. He reports BP has come down ~10 mmHg with the addition of amlodipine. He is being followed closely by our Pharm D HTN clinic and will have another medication added to his regimen. Therefore, based on ACC/AHA guidelines, the patient would be an acceptable risk for the planned procedure without further cardiovascular testing. Per office protocol, he may hold aspirin for 7 days prior to procedure and should resume as soon as hemodynamically stable postoperatively."  Non-insulin-dependent DM2, well-controlled, A1c 6.1 on preop labs.  Preop labs reviewed, mild anemia with hemoglobin 12.6, otherwise unremarkable.  EKG 02/02/2023: Marked sinus bradycardia.  Rate 44.  Nuclear stress 09/03/2022:   Findings are consistent with infarction. The study is low risk.   No ST deviation was noted.   LV perfusion is abnormal. Defect 1: There is a medium defect with moderate reduction in uptake present in the mid to basal inferior location(s) that is fixed. There is abnormal wall motion in the defect area. Consistent with infarction.   Left ventricular function is abnormal. Global function is mildly reduced. End diastolic cavity size is  normal.   Prior study available for comparison from 01/27/2019. No changes compared to prior study.   Low risk stress nuclear study with old mid-basal inferior wall infarction, otherwise normal perfusion and no evidence of reversible ischemia Mildly reduced left ventricular regional systolic function due to inferior wall hypokinesis.   TTE 08/16/2022: 1. Left ventricular ejection fraction, by estimation, is 70 to 75%. The  left ventricle has hyperdynamic function. The left ventricle has no  regional wall motion abnormalities. Left ventricular diastolic parameters  are consistent with Grade I diastolic  dysfunction (impaired relaxation).   2. Right ventricular systolic function is hyperdynamic. The right  ventricular size is normal. Tricuspid regurgitation signal is inadequate  for assessing PA pressure.   3. Left atrial size was severely dilated.   4. Right atrial size was moderately dilated.   5. The mitral valve is grossly normal. No evidence of mitral valve  regurgitation. No evidence of mitral stenosis.   6. The aortic valve is tricuspid. Aortic valve regurgitation is not  visualized. No aortic stenosis is present.   7. Aortic dilatation noted. There is borderline dilatation of the aortic  root, measuring 39 mm.   Comparison(s): No significant change from prior study.      Zannie Cove [facility]/Anesthesiology Phone [phone] 03/17/2023 9:53 AM

## 2023-03-17 NOTE — Progress Notes (Signed)
Established Patient Office Visit   Subjective:  Patient ID: Dakota Gibbs, male    DOB: 1950-06-05  Age: 72 y.o. MRN: [id number]  Chief Complaint  Patient presents with   Medical Management of Chronic Issues    Follow up. Metformin refill.     HPI Encounter Diagnoses  Name Primary?   Prediabetes Yes   Anxiety and depression    Macrocytic anemia    For follow-up of above.  Scheduled for a posterior lateral interbody fusion of L3 and L4 tomorrow.  He is ready.  A1c was measured with preoperative labs at 6.1.  Continues with metformin 500 mg XL daily.  Hemoglobin measured at 12.6 yesterday with an MCV of 93.5.  He continues with a multivitamin with iron.  Continues with Celexa for depression with good effect.  Blood pressure well-controlled amlodipine 2.5, chlorthalidone 12.5 and olmesartan 40.   Review of Systems  Constitutional: Negative.   HENT: Negative.    Eyes:  Negative for blurred vision, discharge and redness.  Respiratory: Negative.    Cardiovascular: Negative.   Gastrointestinal:  Negative for abdominal pain.  Genitourinary: Negative.   Musculoskeletal:  Positive for back pain. Negative for myalgias.  Skin:  Negative for rash.  Neurological:  Negative for tingling, loss of consciousness and weakness.  Endo/Heme/Allergies:  Negative for polydipsia.  Psychiatric/Behavioral:  Negative for depression.      Current Outpatient Medications:    amLODipine (NORVASC) 5 MG tablet, Take 0.5 tablets (2.5 mg total) by mouth daily., Disp: 180 tablet, Rfl: 3   aspirin EC 81 MG tablet, Take 81 mg by mouth daily. Swallow whole., Disp: , Rfl:    atorvastatin (LIPITOR) 20 MG tablet, Take 1 tablet (20 mg total) by mouth daily., Disp: 90 tablet, Rfl: 3   cetirizine (ZYRTEC) 10 MG tablet, Take 10 mg by mouth daily., Disp: , Rfl:    chlorthalidone (HYGROTON) 25 MG tablet, Take 0.5 tablets (12.5 mg total) by mouth daily., Disp: 45 tablet, Rfl: 3   citalopram (CELEXA) 20 MG tablet, (  GENERIC FOR CELEXA) TAKE ONE AND ONE-HALF TABLETS BY MOUTH DAILY, Disp: 135 tablet, Rfl: 1   ezetimibe (ZETIA) 10 MG tablet, TAKE ONE TABLET BY MOUTH DAILY (Patient taking differently: Take 10 mg by mouth at bedtime.), Disp: 90 tablet, Rfl: 3   Multiple Vitamin (MULTIVITAMIN) tablet, Take 1 tablet by mouth daily., Disp: , Rfl:    nitroGLYCERIN (NITROSTAT) 0.4 MG SL tablet, For chest pain, tightness, or pressure. While sitting, place 1 tablet under tongue. May be used every 5 minutes as needed, for up to 15 minutes. Do not use more than 3 tablets., Disp: 25 tablet, Rfl: 6   olmesartan (BENICAR) 40 MG tablet, Take 1 tablet (40 mg total) by mouth at bedtime., Disp: 90 tablet, Rfl: 1   oxymetazoline (AFRIN) 0.05 % nasal spray, Place 1 spray into both nostrils 2 (two) times daily., Disp: , Rfl:    pregabalin (LYRICA) 50 MG capsule, Take 1 capsule (50 mg total) by mouth daily. (Patient taking differently: Take 50 mg by mouth 2 (two) times daily.), Disp: 30 capsule, Rfl: 3   pyridOXINE (VITAMIN B6) 100 MG tablet, Take 100 mg by mouth daily., Disp: , Rfl:    traMADol (ULTRAM) 50 MG tablet, Take 1-2 tablets by mouth every 6 (six) hours as needed for moderate pain or severe pain., Disp: , Rfl:    trolamine salicylate (ASPERCREME) 10 % cream, Apply 1 Application topically as needed for muscle pain., Disp: , Rfl:  vitamin C (ASCORBIC ACID) 500 MG tablet, Take 500 mg by mouth daily., Disp: , Rfl:    vitamin E 180 MG (400 UNITS) capsule, Take 400 Units by mouth daily., Disp: , Rfl:    metFORMIN (GLUCOPHAGE-XR) 500 MG 24 hr tablet, TAKE ONE TABLET BY MOUTH EVERY DAY WITH BREAKFAST, Disp: 90 tablet, Rfl: 1   Objective:     BP 116/68   Pulse 61   Temp 98 F (36.7 C)   Ht 5\' 10"  (1.778 m)   Wt 201 lb 9.6 oz (91.4 kg)   SpO2 98%   BMI 28.93 kg/m    Physical Exam Constitutional:      General: He is not in acute distress.    Appearance: Normal appearance. He is not ill-appearing, toxic-appearing or  diaphoretic.  HENT:     Head: Normocephalic and atraumatic.     Right Ear: External ear normal.     Left Ear: External ear normal.  Eyes:     General: No scleral icterus.       Right eye: No discharge.        Left eye: No discharge.     Extraocular Movements: Extraocular movements intact.     Conjunctiva/sclera: Conjunctivae normal.  Cardiovascular:     Rate and Rhythm: Normal rate and regular rhythm.  Pulmonary:     Effort: Pulmonary effort is normal. No respiratory distress.     Breath sounds: No wheezing, rhonchi or rales.  Skin:    General: Skin is warm and dry.  Neurological:     Mental Status: He is alert and oriented to person, place, and time.  Psychiatric:        Mood and Affect: Mood normal.        Behavior: Behavior normal.      No results found for any visits on 03/17/23.    The ASCVD Risk score (Arnett DK, et al., 2019) failed to calculate for the following reasons:   The valid total cholesterol range is 130 to 320 mg/dL    Assessment & Plan:   Prediabetes -     metFORMIN HCl ER; TAKE ONE TABLET BY MOUTH EVERY DAY WITH BREAKFAST  Dispense: 90 tablet; Refill: 1  Anxiety and depression  Macrocytic anemia    Return in about 6 months (around 09/14/2023).  Continue metformin and Celexa.  Will recheck iron levels on follow-up.  Mliss Sax, MD

## 2023-03-17 NOTE — Anesthesia Preprocedure Evaluation (Addendum)
Anesthesia Evaluation  Patient identified by MRN, date of birth, ID band Patient awake    Reviewed: Allergy & Precautions, NPO status , Patient's Chart, lab work & pertinent test results  History of Anesthesia Complications Negative for: history of anesthetic complications  Airway Mallampati: II  TM Distance: >3 FB Neck ROM: Full    Dental  (+) Dental Advisory Given   Pulmonary sleep apnea and Continuous Positive Airway Pressure Ventilation , Current Smoker and Patient abstained from smoking.   breath sounds clear to auscultation       Cardiovascular hypertension, Pt. on medications (-) angina + CAD (PCI)   Rhythm:Regular Rate:Normal  08/2022 ECHO: EF 70%, hyperdynamic LVF, Grade 1 DD, no significant valvular abnormalities   Neuro/Psych   Anxiety Depression    negative neurological ROS     GI/Hepatic Neg liver ROS,GERD  Controlled,,  Endo/Other  diabetes, Oral Hypoglycemic Agents    Renal/GU negative Renal ROS     Musculoskeletal  (+) Arthritis ,    Abdominal   Peds  Hematology Hb 12.6, plt 221k   Anesthesia Other Findings   Reproductive/Obstetrics                             Anesthesia Physical Anesthesia Plan  ASA: 3  Anesthesia Plan: General   Post-op Pain Management: Tylenol PO (pre-op)*   Induction: Intravenous  PONV Risk Score and Plan: 1 and Ondansetron and Dexamethasone  Airway Management Planned: Oral ETT  Additional Equipment: None  Intra-op Plan:   Post-operative Plan: Extubation in OR  Informed Consent: I have reviewed the patients History and Physical, chart, labs and discussed the procedure including the risks, benefits and alternatives for the proposed anesthesia with the patient or authorized representative who has indicated his/her understanding and acceptance.     Dental advisory given  Plan Discussed with: CRNA and Surgeon  Anesthesia Plan Comments:  (PAT note by Antionette Poles, PA-C: 72 year old male follows with cardiology for history of HTN, HLD, HFpEF, sinus bradycardia, OSA on CPAP, CAD s/p MI with PCI to totally occluded RCA and staged intervention to circumflex marginal vessel in 1997.  Echo 08/2022 showed EF 7075%, grade 1 DD, severely dilated left atrium, no significant valvular abnormalities.  Nuclear stress 09/03/2022 showed findings consistent with prior infarct, no current ischemia, low risk.  Cardiac clearance per telephone encounter 03/09/2023, "Chart reviewed as part of pre-operative protocol coverage. Dakota Gibbs was last seen on 02/02/2023 by Dr. Tresa Endo.  Per Dr. Tresa Endo "OK for lumbar surgey clearance; When last evaluated BP was elevated, and amlodipine was added. Make sure BP has improved." Spoke with patient via telephone. He reports BP has come down ~10 mmHg with the addition of amlodipine. He is being followed closely by our Pharm D HTN clinic and will have another medication added to his regimen. Therefore, based on ACC/AHA guidelines, the patient would be an acceptable risk for the planned procedure without further cardiovascular testing. Per office protocol, he may hold aspirin for 7 days prior to procedure and should resume as soon as hemodynamically stable postoperatively."  Non-insulin-dependent DM2, well-controlled, A1c 6.1 on preop labs.  Preop labs reviewed, mild anemia with hemoglobin 12.6, otherwise unremarkable.  EKG 02/02/2023: Marked sinus bradycardia.  Rate 44.  Nuclear stress 09/03/2022:   Findings are consistent with infarction. The study is low risk.   No ST deviation was noted.   LV perfusion is abnormal. Defect 1: There is a medium defect with  moderate reduction in uptake present in the mid to basal inferior location(s) that is fixed. There is abnormal wall motion in the defect area. Consistent with infarction.   Left ventricular function is abnormal. Global function is mildly reduced. End diastolic cavity  size is normal.   Prior study available for comparison from 01/27/2019. No changes compared to prior study.  Low risk stress nuclear study with old mid-basal inferior wall infarction, otherwise normal perfusion and no evidence of reversible ischemia Mildly reduced left ventricular regional systolic function due to inferior wall hypokinesis.  TTE 08/16/2022: 1. Left ventricular ejection fraction, by estimation, is 70 to 75%. The  left ventricle has hyperdynamic function. The left ventricle has no  regional wall motion abnormalities. Left ventricular diastolic parameters  are consistent with Grade I diastolic  dysfunction (impaired relaxation).  2. Right ventricular systolic function is hyperdynamic. The right  ventricular size is normal. Tricuspid regurgitation signal is inadequate  for assessing PA pressure.  3. Left atrial size was severely dilated.  4. Right atrial size was moderately dilated.  5. The mitral valve is grossly normal. No evidence of mitral valve  regurgitation. No evidence of mitral stenosis.  6. The aortic valve is tricuspid. Aortic valve regurgitation is not  visualized. No aortic stenosis is present.  7. Aortic dilatation noted. There is borderline dilatation of the aortic  root, measuring 39 mm.   Comparison(s): No significant change from prior study.   )        Anesthesia Quick Evaluation

## 2023-03-18 ENCOUNTER — Observation Stay (HOSPITAL_COMMUNITY)
Admission: RE | Admit: 2023-03-18 | Discharge: 2023-03-19 | Disposition: A | Discharge status: Home / Self Care | Payer: Medicare Other | Attending: Neurological Surgery | Admitting: Neurological Surgery

## 2023-03-18 ENCOUNTER — Other Ambulatory Visit: Payer: Self-pay

## 2023-03-18 ENCOUNTER — Ambulatory Visit (HOSPITAL_COMMUNITY): Payer: Medicare Other | Admitting: Physician Assistant

## 2023-03-18 ENCOUNTER — Encounter (HOSPITAL_COMMUNITY): Payer: Self-pay | Admitting: Neurological Surgery

## 2023-03-18 ENCOUNTER — Ambulatory Visit (HOSPITAL_COMMUNITY): Payer: Medicare Other

## 2023-03-18 ENCOUNTER — Ambulatory Visit (HOSPITAL_BASED_OUTPATIENT_CLINIC_OR_DEPARTMENT_OTHER): Payer: Medicare Other | Admitting: Anesthesiology

## 2023-03-18 ENCOUNTER — Encounter (HOSPITAL_COMMUNITY)
Admission: RE | Disposition: A | Discharge status: Home / Self Care | Payer: Self-pay | Source: Home / Self Care | Attending: Neurological Surgery

## 2023-03-18 DIAGNOSIS — I10 Essential (primary) hypertension: Secondary | ICD-10-CM | POA: Diagnosis not present

## 2023-03-18 DIAGNOSIS — Z7984 Long term (current) use of oral hypoglycemic drugs: Secondary | ICD-10-CM | POA: Diagnosis not present

## 2023-03-18 DIAGNOSIS — I2510 Atherosclerotic heart disease of native coronary artery without angina pectoris: Secondary | ICD-10-CM | POA: Insufficient documentation

## 2023-03-18 DIAGNOSIS — E119 Type 2 diabetes mellitus without complications: Secondary | ICD-10-CM | POA: Insufficient documentation

## 2023-03-18 DIAGNOSIS — Z7982 Long term (current) use of aspirin: Secondary | ICD-10-CM | POA: Insufficient documentation

## 2023-03-18 DIAGNOSIS — Z79899 Other long term (current) drug therapy: Secondary | ICD-10-CM | POA: Insufficient documentation

## 2023-03-18 DIAGNOSIS — M48061 Spinal stenosis, lumbar region without neurogenic claudication: Secondary | ICD-10-CM

## 2023-03-18 DIAGNOSIS — Z96652 Presence of left artificial knee joint: Secondary | ICD-10-CM | POA: Diagnosis not present

## 2023-03-18 DIAGNOSIS — Z9889 Other specified postprocedural states: Secondary | ICD-10-CM | POA: Diagnosis not present

## 2023-03-18 DIAGNOSIS — M532X6 Spinal instabilities, lumbar region: Secondary | ICD-10-CM | POA: Diagnosis not present

## 2023-03-18 DIAGNOSIS — F17290 Nicotine dependence, other tobacco product, uncomplicated: Secondary | ICD-10-CM | POA: Diagnosis not present

## 2023-03-18 DIAGNOSIS — Z981 Arthrodesis status: Secondary | ICD-10-CM | POA: Diagnosis not present

## 2023-03-18 DIAGNOSIS — M5116 Intervertebral disc disorders with radiculopathy, lumbar region: Secondary | ICD-10-CM | POA: Diagnosis not present

## 2023-03-18 DIAGNOSIS — Z01818 Encounter for other preprocedural examination: Secondary | ICD-10-CM

## 2023-03-18 LAB — GLUCOSE, CAPILLARY
Glucose-Capillary: 112 mg/dL — ABNORMAL HIGH (ref 70–99)
Glucose-Capillary: 110 mg/dL — ABNORMAL HIGH (ref 70–99)
Glucose-Capillary: 125 mg/dL — ABNORMAL HIGH (ref 70–99)
Glucose-Capillary: 203 mg/dL — ABNORMAL HIGH (ref 70–99)
Glucose-Capillary: 254 mg/dL — ABNORMAL HIGH (ref 70–99)

## 2023-03-18 LAB — ABO/RH: ABO/RH(D): A NEG

## 2023-03-18 SURGERY — POSTERIOR LUMBAR FUSION 1 LEVEL
Anesthesia: General | Site: Back

## 2023-03-18 MED ORDER — ACETAMINOPHEN 650 MG RE SUPP: 650.0000 mg | RECTAL | Status: DC | PRN

## 2023-03-18 MED ORDER — CELECOXIB 200 MG PO CAPS
200.0000 mg | ORAL_CAPSULE | Freq: Two times a day (BID) | ORAL | Status: DC
  Administered 2023-03-18 – 2023-03-19 (×2): 200 mg via ORAL
  Filled 2023-03-18 (×2): qty 1

## 2023-03-18 MED ORDER — THROMBIN 20000 UNITS EX SOLR: CUTANEOUS | Status: DC | PRN

## 2023-03-18 MED ORDER — SUGAMMADEX SODIUM 200 MG/2ML IV SOLN
INTRAVENOUS | Status: DC | PRN
  Administered 2023-03-18: 200 mg via INTRAVENOUS

## 2023-03-18 MED ORDER — MENTHOL 3 MG MT LOZG: 1.0000 | LOZENGE | OROMUCOSAL | Status: DC | PRN

## 2023-03-18 MED ORDER — IRBESARTAN 75 MG PO TABS
37.5000 mg | ORAL_TABLET | Freq: Every day | ORAL | Status: DC
  Administered 2023-03-18 – 2023-03-19 (×2): 37.5 mg via ORAL
  Filled 2023-03-18 (×2): qty 1

## 2023-03-18 MED ORDER — ONDANSETRON HCL 4 MG/2ML IJ SOLN
INTRAMUSCULAR | Status: DC | PRN
  Administered 2023-03-18: 4 mg via INTRAVENOUS

## 2023-03-18 MED ORDER — 0.9 % SODIUM CHLORIDE (POUR BTL) OPTIME
TOPICAL | Status: DC | PRN
  Administered 2023-03-18: 1000 mL

## 2023-03-18 MED ORDER — PROPOFOL 10 MG/ML IV BOLUS
INTRAVENOUS | Status: DC | PRN
  Administered 2023-03-18: 200 mg via INTRAVENOUS

## 2023-03-18 MED ORDER — METHOCARBAMOL 500 MG PO TABS
500.0000 mg | ORAL_TABLET | Freq: Four times a day (QID) | ORAL | Status: DC | PRN
  Administered 2023-03-18 – 2023-03-19 (×2): 500 mg via ORAL
  Filled 2023-03-18 (×2): qty 1

## 2023-03-18 MED ORDER — ACETAMINOPHEN 500 MG PO TABS: 1000.0000 mg | ORAL_TABLET | ORAL | Status: AC

## 2023-03-18 MED ORDER — MEPERIDINE HCL 25 MG/ML IJ SOLN
6.2500 mg | INTRAMUSCULAR | Status: DC | PRN
Start: 2023-03-18 — End: 2023-03-18

## 2023-03-18 MED ORDER — PHENYLEPHRINE HCL-NACL 20-0.9 MG/250ML-% IV SOLN
INTRAVENOUS | Status: DC | PRN
  Administered 2023-03-18: 25 ug/min via INTRAVENOUS

## 2023-03-18 MED ORDER — CHLORHEXIDINE GLUCONATE CLOTH 2 % EX PADS: 6.0000 | MEDICATED_PAD | Freq: Once | CUTANEOUS | Status: DC

## 2023-03-18 MED ORDER — DEXAMETHASONE SODIUM PHOSPHATE 10 MG/ML IJ SOLN
INTRAMUSCULAR | Status: DC | PRN
  Administered 2023-03-18: 4 mg via INTRAVENOUS

## 2023-03-18 MED ORDER — DEXAMETHASONE 4 MG PO TABS
4.0000 mg | ORAL_TABLET | Freq: Four times a day (QID) | ORAL | Status: DC
  Administered 2023-03-18 – 2023-03-19 (×3): 4 mg via ORAL
  Filled 2023-03-18 (×3): qty 1

## 2023-03-18 MED ORDER — THROMBIN 5000 UNITS EX SOLR
OROMUCOSAL | Status: DC | PRN
  Administered 2023-03-18: 5 mL via TOPICAL

## 2023-03-18 MED ORDER — ONDANSETRON HCL 4 MG PO TABS
4.0000 mg | ORAL_TABLET | Freq: Four times a day (QID) | ORAL | Status: DC | PRN
Start: 2023-03-18 — End: 2023-03-19
  Administered 2023-03-18 – 2023-03-19 (×3): 4 mg via ORAL
  Filled 2023-03-18 (×3): qty 1

## 2023-03-18 MED ORDER — METFORMIN HCL ER 500 MG PO TB24
500.0000 mg | ORAL_TABLET | Freq: Every day | ORAL | Status: DC
  Administered 2023-03-19: 500 mg via ORAL
  Filled 2023-03-18: qty 1

## 2023-03-18 MED ORDER — CITALOPRAM HYDROBROMIDE 20 MG PO TABS
20.0000 mg | ORAL_TABLET | Freq: Every day | ORAL | Status: DC
  Administered 2023-03-18 – 2023-03-19 (×2): 20 mg via ORAL
  Filled 2023-03-18 (×2): qty 1

## 2023-03-18 MED ORDER — INSULIN ASPART 100 UNIT/ML IJ SOLN
0.0000 [IU] | Freq: Three times a day (TID) | INTRAMUSCULAR | Status: DC
  Administered 2023-03-19: 5 [IU] via SUBCUTANEOUS

## 2023-03-18 MED ORDER — CEFAZOLIN SODIUM-DEXTROSE 2-4 GM/100ML-% IV SOLN
INTRAVENOUS | Status: AC
  Filled 2023-03-18: qty 100

## 2023-03-18 MED ORDER — INSULIN ASPART 100 UNIT/ML IJ SOLN
0.0000 [IU] | Freq: Three times a day (TID) | INTRAMUSCULAR | Status: DC
  Administered 2023-03-18: 5 [IU] via SUBCUTANEOUS

## 2023-03-18 MED ORDER — EZETIMIBE 10 MG PO TABS
10.0000 mg | ORAL_TABLET | Freq: Every day | ORAL | Status: DC
  Administered 2023-03-18: 10 mg via ORAL
  Filled 2023-03-18: qty 1

## 2023-03-18 MED ORDER — CEFAZOLIN SODIUM-DEXTROSE 2-4 GM/100ML-% IV SOLN
2.0000 g | Freq: Three times a day (TID) | INTRAVENOUS | Status: AC
  Administered 2023-03-18 – 2023-03-19 (×2): 2 g via INTRAVENOUS
  Filled 2023-03-18 (×3): qty 100

## 2023-03-18 MED ORDER — GABAPENTIN 300 MG PO CAPS
ORAL_CAPSULE | ORAL | Status: AC
  Administered 2023-03-18: 300 mg via ORAL
  Filled 2023-03-18: qty 1

## 2023-03-18 MED ORDER — METHOCARBAMOL 1000 MG/10ML IJ SOLN: 500.0000 mg | Freq: Four times a day (QID) | INTRAMUSCULAR | Status: DC | PRN

## 2023-03-18 MED ORDER — OXYCODONE HCL 5 MG/5ML PO SOLN: 5.0000 mg | Freq: Once | ORAL | Status: DC | PRN

## 2023-03-18 MED ORDER — LACTATED RINGERS IV SOLN: INTRAVENOUS | Status: DC

## 2023-03-18 MED ORDER — HYDROMORPHONE HCL 1 MG/ML IJ SOLN
0.2500 mg | INTRAMUSCULAR | Status: DC | PRN
  Administered 2023-03-18 (×4): 0.5 mg via INTRAVENOUS

## 2023-03-18 MED ORDER — BUPIVACAINE HCL (PF) 0.25 % IJ SOLN
INTRAMUSCULAR | Status: DC | PRN
  Administered 2023-03-18: 10 mL
  Administered 2023-03-18: 3 mL

## 2023-03-18 MED ORDER — VITAMIN E 45 MG (100 UNIT) PO CAPS
400.0000 [IU] | ORAL_CAPSULE | Freq: Every day | ORAL | Status: DC
Start: 2023-03-19 — End: 2023-03-19
  Administered 2023-03-19: 400 [IU] via ORAL
  Filled 2023-03-18: qty 4

## 2023-03-18 MED ORDER — ACETAMINOPHEN 325 MG PO TABS: 650.0000 mg | ORAL_TABLET | ORAL | Status: DC | PRN

## 2023-03-18 MED ORDER — MIDAZOLAM HCL 2 MG/2ML IJ SOLN: 0.5000 mg | Freq: Once | INTRAMUSCULAR | Status: DC | PRN

## 2023-03-18 MED ORDER — FENTANYL CITRATE (PF) 250 MCG/5ML IJ SOLN
INTRAMUSCULAR | Status: DC | PRN
  Administered 2023-03-18 (×2): 50 ug via INTRAVENOUS
  Administered 2023-03-18: 100 ug via INTRAVENOUS
  Administered 2023-03-18: 50 ug via INTRAVENOUS

## 2023-03-18 MED ORDER — GLYCOPYRROLATE 0.2 MG/ML IJ SOLN
INTRAMUSCULAR | Status: DC | PRN
  Administered 2023-03-18 (×2): .1 mg via INTRAVENOUS

## 2023-03-18 MED ORDER — SODIUM CHLORIDE 0.9 % IV SOLN: 250.0000 mL | INTRAVENOUS | Status: DC

## 2023-03-18 MED ORDER — CHLORHEXIDINE GLUCONATE 0.12 % MT SOLN
OROMUCOSAL | Status: AC
  Administered 2023-03-18: 15 mL via OROMUCOSAL
  Filled 2023-03-18: qty 15

## 2023-03-18 MED ORDER — TRAMADOL HCL 50 MG PO TABS: 50.0000 mg | ORAL_TABLET | Freq: Four times a day (QID) | ORAL | Status: DC | PRN

## 2023-03-18 MED ORDER — DEXAMETHASONE SODIUM PHOSPHATE 4 MG/ML IJ SOLN: 4.0000 mg | Freq: Four times a day (QID) | INTRAMUSCULAR | Status: DC

## 2023-03-18 MED ORDER — OXYCODONE HCL 5 MG PO TABS: 5.0000 mg | ORAL_TABLET | Freq: Once | ORAL | Status: DC | PRN

## 2023-03-18 MED ORDER — ROCURONIUM BROMIDE 10 MG/ML (PF) SYRINGE
PREFILLED_SYRINGE | INTRAVENOUS | Status: DC | PRN
  Administered 2023-03-18: 70 mg via INTRAVENOUS
  Administered 2023-03-18: 40 mg via INTRAVENOUS

## 2023-03-18 MED ORDER — ONDANSETRON HCL 4 MG/2ML IJ SOLN
4.0000 mg | Freq: Four times a day (QID) | INTRAMUSCULAR | Status: DC | PRN
Start: 2023-03-18 — End: 2023-03-19

## 2023-03-18 MED ORDER — ASPIRIN 81 MG PO TBEC
81.0000 mg | DELAYED_RELEASE_TABLET | Freq: Every day | ORAL | Status: DC
  Administered 2023-03-19: 81 mg via ORAL
  Filled 2023-03-18: qty 1

## 2023-03-18 MED ORDER — ACETAMINOPHEN 500 MG PO TABS
ORAL_TABLET | ORAL | Status: AC
  Administered 2023-03-18: 1000 mg via ORAL
  Filled 2023-03-18: qty 2

## 2023-03-18 MED ORDER — CEFAZOLIN SODIUM-DEXTROSE 2-4 GM/100ML-% IV SOLN
2.0000 g | INTRAVENOUS | Status: AC
  Administered 2023-03-18: 2 g via INTRAVENOUS

## 2023-03-18 MED ORDER — SURGIRINSE WOUND IRRIGATION SYSTEM - OPTIME: TOPICAL | Status: DC | PRN

## 2023-03-18 MED ORDER — BUPIVACAINE HCL (PF) 0.25 % IJ SOLN
INTRAMUSCULAR | Status: AC
  Filled 2023-03-18: qty 30

## 2023-03-18 MED ORDER — INSULIN ASPART 100 UNIT/ML IJ SOLN
0.0000 [IU] | Freq: Every day | INTRAMUSCULAR | Status: DC
  Administered 2023-03-18: 3 [IU] via SUBCUTANEOUS

## 2023-03-18 MED ORDER — AMLODIPINE BESYLATE 5 MG PO TABS
2.5000 mg | ORAL_TABLET | Freq: Every day | ORAL | Status: DC
  Administered 2023-03-18 – 2023-03-19 (×2): 2.5 mg via ORAL
  Filled 2023-03-18 (×2): qty 1

## 2023-03-18 MED ORDER — HYDROMORPHONE HCL 1 MG/ML IJ SOLN
INTRAMUSCULAR | Status: AC
  Filled 2023-03-18: qty 1

## 2023-03-18 MED ORDER — INSULIN ASPART 100 UNIT/ML IJ SOLN: 0.0000 [IU] | INTRAMUSCULAR | Status: DC | PRN

## 2023-03-18 MED ORDER — SENNA 8.6 MG PO TABS
1.0000 | ORAL_TABLET | Freq: Two times a day (BID) | ORAL | Status: DC
  Administered 2023-03-18 – 2023-03-19 (×2): 8.6 mg via ORAL
  Filled 2023-03-18 (×2): qty 1

## 2023-03-18 MED ORDER — PROPOFOL 10 MG/ML IV BOLUS
INTRAVENOUS | Status: AC
  Filled 2023-03-18: qty 20

## 2023-03-18 MED ORDER — PREGABALIN 25 MG PO CAPS
50.0000 mg | ORAL_CAPSULE | Freq: Two times a day (BID) | ORAL | Status: DC
  Administered 2023-03-18 – 2023-03-19 (×2): 50 mg via ORAL
  Filled 2023-03-18 (×2): qty 2

## 2023-03-18 MED ORDER — THROMBIN 20000 UNITS EX SOLR
CUTANEOUS | Status: AC
  Filled 2023-03-18: qty 20000

## 2023-03-18 MED ORDER — VITAMIN C 500 MG PO TABS
500.0000 mg | ORAL_TABLET | Freq: Every day | ORAL | Status: DC
  Administered 2023-03-19: 500 mg via ORAL
  Filled 2023-03-18: qty 1

## 2023-03-18 MED ORDER — FENTANYL CITRATE (PF) 250 MCG/5ML IJ SOLN
INTRAMUSCULAR | Status: AC
  Filled 2023-03-18: qty 5

## 2023-03-18 MED ORDER — KCL IN DEXTROSE-NACL 10-5-0.45 MEQ/L-%-% IV SOLN
INTRAVENOUS | Status: DC
  Filled 2023-03-18: qty 1000

## 2023-03-18 MED ORDER — CHLORTHALIDONE 25 MG PO TABS
12.5000 mg | ORAL_TABLET | Freq: Every day | ORAL | Status: DC
  Administered 2023-03-19: 12.5 mg via ORAL
  Filled 2023-03-18: qty 1

## 2023-03-18 MED ORDER — EPHEDRINE SULFATE-NACL 50-0.9 MG/10ML-% IV SOSY
PREFILLED_SYRINGE | INTRAVENOUS | Status: DC | PRN
  Administered 2023-03-18: 10 mg via INTRAVENOUS
  Administered 2023-03-18: 5 mg via INTRAVENOUS

## 2023-03-18 MED ORDER — SODIUM CHLORIDE 0.9% FLUSH: 3.0000 mL | INTRAVENOUS | Status: DC | PRN

## 2023-03-18 MED ORDER — THROMBIN 5000 UNITS EX SOLR
CUTANEOUS | Status: AC
Start: 2023-03-18 — End: ?
  Filled 2023-03-18: qty 5000

## 2023-03-18 MED ORDER — SODIUM CHLORIDE 0.9% FLUSH
3.0000 mL | Freq: Two times a day (BID) | INTRAVENOUS | Status: DC
  Administered 2023-03-18: 3 mL via INTRAVENOUS

## 2023-03-18 MED ORDER — PHENOL 1.4 % MT LIQD: 1.0000 | OROMUCOSAL | Status: DC | PRN

## 2023-03-18 MED ORDER — ORAL CARE MOUTH RINSE
15.0000 mL | Freq: Once | OROMUCOSAL | Status: AC
Start: 2023-03-18 — End: 2023-03-18

## 2023-03-18 MED ORDER — HYDROCODONE-ACETAMINOPHEN 10-325 MG PO TABS
1.0000 | ORAL_TABLET | ORAL | Status: DC | PRN
  Administered 2023-03-18 – 2023-03-19 (×5): 1 via ORAL
  Filled 2023-03-18 (×5): qty 1

## 2023-03-18 MED ORDER — CHLORHEXIDINE GLUCONATE 0.12 % MT SOLN: 15.0000 mL | Freq: Once | OROMUCOSAL | Status: AC

## 2023-03-18 MED ORDER — GABAPENTIN 300 MG PO CAPS: 300.0000 mg | ORAL_CAPSULE | ORAL | Status: AC

## 2023-03-18 SURGICAL SUPPLY — 60 items
BAG COUNTER SPONGE SURGICOUNT (BAG) ×1 IMPLANT
BASKET BONE COLLECTION (BASKET) ×1 IMPLANT
BENZOIN TINCTURE PRP APPL 2/3 (GAUZE/BANDAGES/DRESSINGS) ×1 IMPLANT
BLADE BONE MILL MEDIUM (MISCELLANEOUS) ×1 IMPLANT
BLADE CLIPPER SURG (BLADE) IMPLANT
BUR CARBIDE MATCH 3.0 (BURR) ×1 IMPLANT
CANISTER SUCT 3000ML PPV (MISCELLANEOUS) ×1 IMPLANT
CNTNR URN SCR LID CUP LEK RST (MISCELLANEOUS) ×1 IMPLANT
CONT SPEC 4OZ STRL OR WHT (MISCELLANEOUS) ×1
COVER BACK TABLE 60X90IN (DRAPES) ×1 IMPLANT
DERMABOND ADVANCED .7 DNX12 (GAUZE/BANDAGES/DRESSINGS) ×1 IMPLANT
DRAPE C-ARM 42X72 X-RAY (DRAPES) ×2 IMPLANT
DRAPE C-ARMOR (DRAPES) ×1 IMPLANT
DRAPE LAPAROTOMY 100X72X124 (DRAPES) ×1 IMPLANT
DRAPE SURG 17X23 STRL (DRAPES) ×1 IMPLANT
DRSG OPSITE POSTOP 4X6 (GAUZE/BANDAGES/DRESSINGS) IMPLANT
DURAPREP 26ML APPLICATOR (WOUND CARE) ×1 IMPLANT
ELECT REM PT RETURN 9FT ADLT (ELECTROSURGICAL) ×1
ELECTRODE REM PT RTRN 9FT ADLT (ELECTROSURGICAL) ×1 IMPLANT
EVACUATOR 1/8 PVC DRAIN (DRAIN) ×1 IMPLANT
GAUZE 4X4 16PLY ~~LOC~~+RFID DBL (SPONGE) IMPLANT
GLOVE BIO SURGEON STRL SZ7 (GLOVE) IMPLANT
GLOVE BIO SURGEON STRL SZ8 (GLOVE) ×2 IMPLANT
GLOVE BIOGEL PI IND STRL 7.0 (GLOVE) IMPLANT
GOWN STRL REUS W/ TWL LRG LVL3 (GOWN DISPOSABLE) IMPLANT
GOWN STRL REUS W/ TWL XL LVL3 (GOWN DISPOSABLE) ×2 IMPLANT
GOWN STRL REUS W/TWL 2XL LVL3 (GOWN DISPOSABLE) IMPLANT
GOWN STRL REUS W/TWL LRG LVL3 (GOWN DISPOSABLE)
GOWN STRL REUS W/TWL XL LVL3 (GOWN DISPOSABLE) ×2
GRAFT BONE PROTEIOS LRG 5CC (Orthopedic Implant) IMPLANT
HEMOSTAT POWDER KIT SURGIFOAM (HEMOSTASIS) ×1 IMPLANT
KIT BASIN OR (CUSTOM PROCEDURE TRAY) ×1 IMPLANT
KIT POSITION SURG JACKSON T1 (MISCELLANEOUS) ×1 IMPLANT
KIT TURNOVER KIT B (KITS) ×1 IMPLANT
MATRIX SPINE STRIP NEOCORE 5CC (Putty) IMPLANT
MILL BONE PREP (MISCELLANEOUS) ×1 IMPLANT
NDL HYPO 25X1 1.5 SAFETY (NEEDLE) ×1 IMPLANT
NEEDLE HYPO 25X1 1.5 SAFETY (NEEDLE) ×1 IMPLANT
NS IRRIG 1000ML POUR BTL (IV SOLUTION) ×1 IMPLANT
PACK LAMINECTOMY NEURO (CUSTOM PROCEDURE TRAY) ×1 IMPLANT
PAD ARMBOARD 7.5X6 YLW CONV (MISCELLANEOUS) ×3 IMPLANT
ROD LORD LIPPED TI 5.5X35 (Rod) IMPLANT
SCREW CANC SHANK MOD 6.5X45 (Screw) IMPLANT
SCREW KODIAK 6.5X45 (Screw) IMPLANT
SCREW POLYAXIAL TULIP (Screw) IMPLANT
SET SCREW (Screw) ×4 IMPLANT
SET SCREW SPNE (Screw) IMPLANT
SOLUTION IRRIG SURGIPHOR (IV SOLUTION) ×1 IMPLANT
SPACER IDENTITI 9X7X25 5D (Spacer) IMPLANT
SPONGE SURGIFOAM ABS GEL 100 (HEMOSTASIS) ×1 IMPLANT
SPONGE T-LAP 4X18 ~~LOC~~+RFID (SPONGE) IMPLANT
STRIP CLOSURE SKIN 1/2X4 (GAUZE/BANDAGES/DRESSINGS) ×2 IMPLANT
SUT VIC AB 0 CT1 18XCR BRD8 (SUTURE) ×1 IMPLANT
SUT VIC AB 0 CT1 8-18 (SUTURE) ×1
SUT VIC AB 2-0 CP2 18 (SUTURE) ×1 IMPLANT
SUT VIC AB 3-0 SH 8-18 (SUTURE) ×2 IMPLANT
TOWEL GREEN STERILE (TOWEL DISPOSABLE) ×1 IMPLANT
TOWEL GREEN STERILE FF (TOWEL DISPOSABLE) ×1 IMPLANT
TRAY FOLEY MTR SLVR 16FR STAT (SET/KITS/TRAYS/PACK) ×1 IMPLANT
WATER STERILE IRR 1000ML POUR (IV SOLUTION) ×1 IMPLANT

## 2023-03-18 NOTE — Progress Notes (Signed)
   03/18/23 2200  BiPAP/CPAP/SIPAP  $ Non-Invasive Ventilator  Non-Invasive Vent Set Up  BiPAP/CPAP/SIPAP Pt Type Adult  BiPAP/CPAP/SIPAP DREAMSTATIOND  Mask Type Nasal pillows (Patient brought from home)  FiO2 (%) 21 %  Patient Home Equipment No

## 2023-03-18 NOTE — Op Note (Signed)
03/18/2023  1:07 PM  PATIENT:  Dakota Gibbs  72 y.o. male  PRE-OPERATIVE DIAGNOSIS: Postlaminectomy spinal stenosis with instability L3-4, degenerative disc disease L3-4, lumbar disc herniation L3-4 right, back pain with radiculopathy  POST-OPERATIVE DIAGNOSIS:  same  PROCEDURE:   1. Decompressive lumbar laminectomy, hemi facetectomy and foraminotomies L3-4 requiring more work than would be required for a simple exposure of the disk for PLIF in order to adequately decompress the neural elements and address the spinal stenosis 2. Posterior lumbar interbody fusion L3-4 using PTI interbody cages packed with morcellized allograft and autograft  3. Posterior fixation L3-4 using ATEC cortical pedicle screws.  4. Intertransverse arthrodesis L3-4 using morcellized autograft and allograft.  SURGEON:  Marikay Alar, MD  ASSISTANTS: Verlin Dike, FNP  ANESTHESIA:  General  EBL: 100 ml  Total I/O In: 1100 [I.V.:1000; IV Piggyback:100] Out: -   BLOOD ADMINISTERED:none  DRAINS: none   INDICATION FOR PROCEDURE: This patient presented with back pain with bilateral leg pain. Imaging revealed previous laminectomy L3-4 with postlaminectomy severe spinal stenosis with loss of displaced height and lumbar disc herniation. The patient tried a reasonable attempt at conservative medical measures without relief. I recommended decompression and instrumented fusion to address the stenosis as well as the segmental  instability.  Patient understood the risks, benefits, and alternatives and potential outcomes and wished to proceed.  PROCEDURE DETAILS:  The patient was brought to the operating room. After induction of generalized endotracheal anesthesia the patient was rolled into the prone position on chest rolls and all pressure points were padded. The patient's lumbar region was cleaned and then prepped with DuraPrep and draped in the usual sterile fashion. Anesthesia was injected and then a dorsal  midline incision was made and carried down to the lumbosacral fascia. The fascia was opened and the paraspinous musculature was taken down in a subperiosteal fashion to expose L3-4. A self-retaining retractor was placed. Intraoperative fluoroscopy confirmed my level, and I started with placement of the L3 cortical pedicle screws. The pedicle screw entry zones were identified utilizing surface landmarks and  AP and lateral fluoroscopy. I scored the cortex with the high-speed drill and then used the hand drill to drill an upward and outward direction into the pedicle. I then tapped line to line. I then placed a 6.5 x 45 mm cortical pedicle screw into the pedicles of L3 bilaterally.    I then turned my attention to the decompression and complete lumbar laminectomies, hemi- facetectomies, and foraminotomies were performed at L3-4.  My nurse practitioner was directly involved in the decompression and exposure of the neural elements. the patient had significant spinal stenosis and this required more work than would be required for a simple exposure of the disc for posterior lumbar interbody fusion which would only require a limited laminotomy. Much more generous decompression and generous foraminotomy was undertaken in order to adequately decompress the neural elements and address the patient's leg pain. The yellow ligament was removed to expose the underlying dura and nerve roots, and generous foraminotomies were performed to adequately decompress the neural elements.  We were careful because of the previous surgery and the epidural fibrosis to gently dissect the tissues away from the bony edges.  At no time did we see CSF.  Both the exiting and traversing nerve roots were decompressed on both sides until a coronary dilator passed easily along the nerve roots. Once the decompression was complete, I turned my attention to the posterior lower lumbar interbody fusion. The epidural venous  vasculature was coagulated and cut  sharply. Disc space was incised and the initial discectomy was performed with pituitary rongeurs. The disc space was distracted with sequential distractors to a height of 7 mm. We then used a series of scrapers and shavers to prepare the endplates for fusion. The midline was prepared with Epstein curettes. Once the complete discectomy was finished, we packed an appropriate sized interbody cage with local autograft and morcellized allograft, gently retracted the nerve root, and tapped the cage into position at L3-4.  The midline between the cages was packed with morselized autograft and allograft.   We then turned our attention to the placement of the lower pedicle screws. The pedicle screw entry zones were identified utilizing surface landmarks and fluoroscopy. I drilled into each pedicle utilizing the hand drill, and tapped each pedicle with the appropriate tap. We palpated with a ball probe to assure no break in the cortex. We then placed 6.5 x 45 mm pedicle screws into the pedicles bilaterally at L4.  My nurse practitioner assisted in placement of the pedicle screws.  We then decorticated the transverse processes and laid a mixture of morcellized autograft and allograft out over these to perform intertransverse arthrodesis at L3-4. We then placed lordotic rods into the multiaxial screw heads of the pedicle screws and locked these in position with the locking caps and anti-torque device. We then checked our construct with AP and lateral fluoroscopy. Irrigated with copious amounts of 0.5% povidone iodine solution followed by saline solution. Inspected the nerve roots once again to assure adequate decompression, lined to the dura with Gelfoam,  and then we closed the muscle and the fascia with 0 Vicryl. Closed the subcutaneous tissues with 2-0 Vicryl and subcuticular tissues with 3-0 Vicryl. The skin was closed with benzoin and Steri-Strips. Dressing was then applied, the patient was awakened from general  anesthesia and transported to the recovery room in stable condition. At the end of the procedure all sponge, needle and instrument counts were correct.   PLAN OF CARE: admit to inpatient  PATIENT DISPOSITION:  PACU - hemodynamically stable.   Delay start of Pharmacological VTE agent (>24hrs) due to surgical blood loss or risk of bleeding:  yes

## 2023-03-18 NOTE — H&P (Signed)
Subjective: Patient is a 72 y.o. male admitted for spondylolisthesis with stenosis L3-4. Onset of symptoms was several months ago, gradually worsening since that time.  The pain is rated severe, and is located at the across the lower back and radiates to legs with walking. The pain is described as aching and occurs all day. The symptoms have been progressive. Symptoms are exacerbated by exercise, standing, and walking for more than a few minutes. MRI or CT showed residual stenosis l3-4, previous surgery at this level   Past Medical History:  Diagnosis Date   Allergy    enviornmental   Anxiety    Anxiety and depression 10/25/2012   Arthritis    self dx   Back pain    L4-L5 bulging disc, L5-S1 - bulging disc, pinched nerve in neck   Cataract    bilateral sx   Coronary artery disease    hx of balloon angioplasty with DR Bishop Limbo   Depression    Diabetes mellitus    type II   Dyspnea    History of shingles 2009   Hyperlipidemia    on meds   Hypertension    on meds   Myocardial infarction (HCC) 1997   caused by a blood clot   Neuropathy    Sleep apnea    cpap    Past Surgical History:  Procedure Laterality Date   ANGIOPLASTY  1997   BACK SURGERY  04/02/2022   basel cell     skin carcinoma removed x 4   BILATERAL CARPAL TUNNEL RELEASE  2023   CARDIAC CATHETERIZATION     10/04/1999   COLONOSCOPY  2018   SA-MAC-suprep(good)-TA   COSMETIC SURGERY     right wrist - removal of keloid   DOPPLER ECHOCARDIOGRAPHY  07/09/2006   EF 50 to 55 %, LA mildy dilated   HERNIA REPAIR  1951   RIGHT   KNEE ARTHROSCOPY Right 2001   R   NM MYOCAR PERF WALL MOTION  08/22/2011   Mets 13,low risk study   POLYPECTOMY  2018   TA   TOTAL KNEE ARTHROPLASTY Left 08/13/2022   Procedure: TOTAL KNEE ARTHROPLASTY;  Surgeon: Jene Every, MD;  Location: [facility];  Service: Orthopedics;  Laterality: Left;   WRIST SURGERY  1984   right    Prior to Admission medications   Medication Sig Start Date End  Date Taking? Authorizing Provider  amLODipine (NORVASC) 5 MG tablet Take 0.5 tablets (2.5 mg total) by mouth daily. 02/02/23 05/03/23 Yes Lennette Bihari, MD  aspirin EC 81 MG tablet Take 81 mg by mouth daily. Swallow whole.   Yes [provider]  atorvastatin (LIPITOR) 20 MG tablet Take 1 tablet (20 mg total) by mouth daily. 02/02/23 05/03/23 Yes Lennette Bihari, MD  cetirizine (ZYRTEC) 10 MG tablet Take 10 mg by mouth daily.   Yes [provider]  chlorthalidone (HYGROTON) 25 MG tablet Take 0.5 tablets (12.5 mg total) by mouth daily. 03/09/23  Yes Lennette Bihari, MD  citalopram (CELEXA) 20 MG tablet ( GENERIC FOR CELEXA) TAKE ONE AND ONE-HALF TABLETS BY MOUTH DAILY 11/21/22  Yes Mliss Sax, MD  ezetimibe (ZETIA) 10 MG tablet TAKE ONE TABLET BY MOUTH DAILY Patient taking differently: Take 10 mg by mouth at bedtime. 04/29/22  Yes Lennette Bihari, MD  metFORMIN (GLUCOPHAGE-XR) 500 MG 24 hr tablet TAKE ONE TABLET BY MOUTH EVERY DAY WITH BREAKFAST 03/17/23  Yes Mliss Sax, MD  Multiple Vitamin (MULTIVITAMIN) tablet Take  1 tablet by mouth daily.   Yes [provider]  olmesartan (BENICAR) 40 MG tablet Take 1 tablet (40 mg total) by mouth at bedtime. 09/05/22  Yes Croitoru, Mihai, MD  oxymetazoline (AFRIN) 0.05 % nasal spray Place 1 spray into both nostrils 2 (two) times daily.   Yes [provider]  pregabalin (LYRICA) 50 MG capsule Take 1 capsule (50 mg total) by mouth daily. Patient taking differently: Take 50 mg by mouth 2 (two) times daily. 01/09/23  Yes Marcos Eke, PA-C  pyridOXINE (VITAMIN B6) 100 MG tablet Take 100 mg by mouth daily.   Yes [provider]  trolamine salicylate (ASPERCREME) 10 % cream Apply 1 Application topically as needed for muscle pain.   Yes [provider]  vitamin C (ASCORBIC ACID) 500 MG tablet Take 500 mg by mouth daily.   Yes [provider]  vitamin E 180 MG (400 UNITS) capsule Take  400 Units by mouth daily.   Yes [provider]  nitroGLYCERIN (NITROSTAT) 0.4 MG SL tablet For chest pain, tightness, or pressure. While sitting, place 1 tablet under tongue. May be used every 5 minutes as needed, for up to 15 minutes. Do not use more than 3 tablets. 06/16/22   Lennette Bihari, MD  traMADol (ULTRAM) 50 MG tablet Take 1-2 tablets by mouth every 6 (six) hours as needed for moderate pain or severe pain. 08/15/22   [provider]   Allergies  Allergen Reactions   Hydrocodone Shortness Of Breath, Nausea And Vomiting, Nausea Only, Anxiety and Other (See Comments)    Will take with Anti-Nausea medication   Oxycodone Diarrhea and Nausea Only    Social History   Tobacco Use   Smoking status: Some Days    Types: Cigars    Last attempt to quit: 09/2020    Years since quitting: 2.5   Smokeless tobacco: Never   Tobacco comments:    Smokes 1 cigar once a week  Substance Use Topics   Alcohol use: Not Currently    Alcohol/week: 14.0 standard drinks of alcohol    Types: 14 Glasses of wine per week    Comment: 2 glasses of wine per night    Family History  Problem Relation Age of Onset   Stroke Mother        TIA   COPD Mother    Hypertension Sister    Prostate cancer Father 64   Coronary artery disease Father        GM   Diabetes Brother    Prostate cancer Brother 27   Diabetes Maternal Grandmother    Colon cancer Neg Hx    Esophageal cancer Neg Hx    Colon polyps Neg Hx    Rectal cancer Neg Hx    Stomach cancer Neg Hx      Review of Systems  Positive ROS: neg  All other systems have been reviewed and were otherwise negative with the exception of those mentioned in the HPI and as above.  Objective: Vital signs in last 24 hours: Temp:  [98 F (36.7 C)-98.8 F (37.1 C)] 98.8 F (37.1 C) (11/13 0813) Pulse Rate:  [53-61] 53 (11/13 0813) Resp:  [17] 17 (11/13 0813) BP: (116-148)/(53-68) 148/53 (11/13 0813) SpO2:  [96 %-98 %] 96 % (11/13  0813) Weight:  [90.7 kg-91.4 kg] 90.7 kg (11/13 0813)  General Appearance: Alert, cooperative, no distress, appears stated age Head: Normocephalic, without obvious abnormality, atraumatic Eyes: PERRL, conjunctiva/corneas clear, EOM's intact  Neck: Supple, symmetrical, trachea midline Back: Symmetric, no curvature, ROM normal, no CVA tenderness Lungs:  respirations unlabored Heart: Regular rate and rhythm Abdomen: Soft, non-tender Extremities: Extremities normal, atraumatic, no cyanosis or edema Pulses: 2+ and symmetric all extremities Skin: Skin color, texture, turgor normal, no rashes or lesions  NEUROLOGIC:   Mental status: Alert and oriented x4,  no aphasia, good attention span, fund of knowledge, and memory Motor Exam - grossly normal Sensory Exam - grossly normal Reflexes: 1+ Coordination - grossly normal Gait - grossly normal Balance - grossly normal Cranial Nerves: I: smell Not tested  II: visual acuity  OS: nl    OD: nl  II: visual fields Full to confrontation  II: pupils Equal, round, reactive to light  III,VII: ptosis None  III,IV,VI: extraocular muscles  Full ROM  V: mastication Normal  V: facial light touch sensation  Normal  V,VII: corneal reflex  Present  VII: facial muscle function - upper  Normal  VII: facial muscle function - lower Normal  VIII: hearing Not tested  IX: soft palate elevation  Normal  IX,X: gag reflex Present  XI: trapezius strength  5/5  XI: sternocleidomastoid strength 5/5  XI: neck flexion strength  5/5  XII: tongue strength  Normal    Data Review Lab Results  Component Value Date   WBC 8.1 03/16/2023   HGB 12.6 (L) 03/16/2023   HCT 37.7 (L) 03/16/2023   MCV 93.5 03/16/2023   PLT 221 03/16/2023   Lab Results  Component Value Date   NA 142 03/16/2023   K 4.3 03/16/2023   CL 105 03/16/2023   CO2 26 03/16/2023   BUN 25 (H) 03/16/2023   CREATININE 0.93 03/16/2023   GLUCOSE 115 (H) 03/16/2023   Lab Results  Component  Value Date   INR 1.1 03/16/2023    Assessment/Plan:  Estimated body mass index is 28.7 kg/m as calculated from the following:   Height as of this encounter: 5\' 10"  (1.778 m).   Weight as of this encounter: 90.7 kg. Patient admitted for PLIF L3-4. Patient has failed a reasonable attempt at conservative therapy.  I explained the condition and procedure to the patient and answered any questions.  Patient wishes to proceed with procedure as planned. Understands risks/ benefits and typical outcomes of procedure.   Tia Alert 03/18/2023 10:13 AM

## 2023-03-18 NOTE — Transfer of Care (Signed)
Immediate Anesthesia Transfer of Care Note  Patient: Dakota Gibbs  Procedure(s) Performed: POSTERIOR LUMBAR INTERBODY FUSION LUMBAR THREE-FOUR WITH POSTERIOR LATERAL ARTHRODESIS (Back)  Patient Location: PACU  Anesthesia Type:General  Level of Consciousness: awake and alert   Airway & Oxygen Therapy: Patient Spontanous Breathing  Post-op Assessment: Report given to RN and Post -op Vital signs reviewed and stable  Post vital signs: Reviewed and stable  Last Vitals:  Vitals Value Taken Time  BP 160/54 03/18/23 1315  Temp 36.6 C 03/18/23 1315  Pulse 56 03/18/23 1324  Resp 19 03/18/23 1324  SpO2 97 % 03/18/23 1324  Vitals shown include unfiled device data.  Last Pain:  Vitals:   03/18/23 0824  PainSc: 0-No pain         Complications: No notable events documented.

## 2023-03-18 NOTE — Anesthesia Procedure Notes (Signed)
Procedure Name: Intubation Date/Time: 03/18/2023 10:47 AM  Performed by: Sandie Ano, CRNAPre-anesthesia Checklist: Patient identified, Emergency Drugs available, Suction available and Patient being monitored Patient Re-evaluated:Patient Re-evaluated prior to induction Oxygen Delivery Method: Circle System Utilized Preoxygenation: Pre-oxygenation with 100% oxygen Induction Type: IV induction Ventilation: Mask ventilation without difficulty Laryngoscope Size: Mac and 3 Grade View: Grade I Tube type: Oral Tube size: 7.0 mm Number of attempts: 1 Airway Equipment and Method: Stylet and Oral airway Placement Confirmation: ETT inserted through vocal cords under direct vision, positive ETCO2 and breath sounds checked- equal and bilateral Secured at: 22 cm Tube secured with: Tape Dental Injury: Teeth and Oropharynx as per pre-operative assessment

## 2023-03-18 NOTE — Anesthesia Postprocedure Evaluation (Signed)
Anesthesia Post Note  Patient: Dakota Gibbs  Procedure(s) Performed: POSTERIOR LUMBAR INTERBODY FUSION LUMBAR THREE-FOUR WITH POSTERIOR LATERAL ARTHRODESIS (Back)     Patient location during evaluation: PACU Anesthesia Type: General Level of consciousness: awake and alert, patient cooperative and oriented Pain management: pain level controlled Vital Signs Assessment: post-procedure vital signs reviewed and stable Respiratory status: spontaneous breathing, nonlabored ventilation and respiratory function stable Cardiovascular status: blood pressure returned to baseline and stable Postop Assessment: no apparent nausea or vomiting Anesthetic complications: no   No notable events documented.  Last Vitals:  Vitals:   03/18/23 1415 03/18/23 1430  BP: (!) 132/50 (!) 130/39  Pulse: 61 (!) 47  Resp: 15 12  Temp: 36.7 C   SpO2: 95% 97%    Last Pain:  Vitals:   03/18/23 1430  PainSc: Asleep                 Tracia Lacomb,E. Allessandra Bernardi

## 2023-03-19 ENCOUNTER — Other Ambulatory Visit (HOSPITAL_COMMUNITY): Payer: Self-pay

## 2023-03-19 DIAGNOSIS — I10 Essential (primary) hypertension: Secondary | ICD-10-CM | POA: Diagnosis not present

## 2023-03-19 DIAGNOSIS — M532X6 Spinal instabilities, lumbar region: Secondary | ICD-10-CM | POA: Diagnosis not present

## 2023-03-19 DIAGNOSIS — M5116 Intervertebral disc disorders with radiculopathy, lumbar region: Secondary | ICD-10-CM | POA: Diagnosis not present

## 2023-03-19 DIAGNOSIS — E119 Type 2 diabetes mellitus without complications: Secondary | ICD-10-CM | POA: Diagnosis not present

## 2023-03-19 DIAGNOSIS — F17290 Nicotine dependence, other tobacco product, uncomplicated: Secondary | ICD-10-CM | POA: Diagnosis not present

## 2023-03-19 DIAGNOSIS — Z7982 Long term (current) use of aspirin: Secondary | ICD-10-CM | POA: Diagnosis not present

## 2023-03-19 DIAGNOSIS — M48061 Spinal stenosis, lumbar region without neurogenic claudication: Secondary | ICD-10-CM | POA: Diagnosis not present

## 2023-03-19 DIAGNOSIS — Z7984 Long term (current) use of oral hypoglycemic drugs: Secondary | ICD-10-CM | POA: Diagnosis not present

## 2023-03-19 DIAGNOSIS — Z96652 Presence of left artificial knee joint: Secondary | ICD-10-CM | POA: Diagnosis not present

## 2023-03-19 DIAGNOSIS — I2510 Atherosclerotic heart disease of native coronary artery without angina pectoris: Secondary | ICD-10-CM | POA: Diagnosis not present

## 2023-03-19 DIAGNOSIS — Z79899 Other long term (current) drug therapy: Secondary | ICD-10-CM | POA: Diagnosis not present

## 2023-03-19 LAB — GLUCOSE, CAPILLARY: Glucose-Capillary: 215 mg/dL — ABNORMAL HIGH (ref 70–99)

## 2023-03-19 MED ORDER — HYDROCODONE-ACETAMINOPHEN 5-325 MG PO TABS
1.0000 | ORAL_TABLET | ORAL | 0 refills | Status: DC | PRN
  Filled 2023-03-19: qty 30, 5d supply, fill #0

## 2023-03-19 MED ORDER — ONDANSETRON HCL 4 MG PO TABS
4.0000 mg | ORAL_TABLET | Freq: Three times a day (TID) | ORAL | 0 refills | Status: DC | PRN
  Filled 2023-03-19: qty 20, 7d supply, fill #0

## 2023-03-19 MED ORDER — DIPHENHYDRAMINE HCL 25 MG PO CAPS
25.0000 mg | ORAL_CAPSULE | Freq: Four times a day (QID) | ORAL | Status: DC | PRN
  Administered 2023-03-19: 25 mg via ORAL
  Filled 2023-03-19: qty 1

## 2023-03-19 MED ORDER — METHOCARBAMOL 500 MG PO TABS
500.0000 mg | ORAL_TABLET | Freq: Four times a day (QID) | ORAL | 0 refills | Status: DC | PRN
  Filled 2023-03-19: qty 45, 12d supply, fill #0

## 2023-03-19 NOTE — Evaluation (Signed)
Physical Therapy Evaluation Patient Details Name: Dakota Gibbs MRN: [id number] DOB: 1950/10/08 Today's Date: 03/19/2023  History of Present Illness  The pt is a 72 yo male presenting 11/13 for decompressive laminotomy and PLIF L3-4. PMH includes: L TKA 08/13/2022, anxiety, depression, back surgery 2023, CAD, DM II, HLD, HTN, MI, and OSA.   Clinical Impression  Pt in bed upon arrival of PT, agreeable to evaluation at this time. Prior to admission the pt was independent, no recent falls or use of DME. The pt was able to complete log roll for bed mobility and sit-stand transfer without assistance. He also completed 500 ft hallway ambulation and navigation of 3 stairs x2 with use of RW and no rails to mimic his home environment. Pt able to complete all mobility without physical assistance, is safe to return home with family support when medically cleared. All education completed in regards to progressive walking program, spinal precautions, stair management, and car transfer training.           Equipment Recommendations None recommended by PT  Recommendations for Other Services       Functional Status Assessment Patient has had a recent decline in their functional status and demonstrates the ability to make significant improvements in function in a reasonable and predictable amount of time.     Precautions / Restrictions Precautions Precautions: Back Precaution Booklet Issued: Yes (comment) Required Braces or Orthoses: Spinal Brace Spinal Brace: Lumbar corset;Applied in sitting position Restrictions Weight Bearing Restrictions: No      Mobility  Bed Mobility Overal bed mobility: Needs Assistance Bed Mobility: Rolling, Sidelying to Sit, Sit to Sidelying Rolling: Supervision Sidelying to sit: Supervision     Sit to sidelying: Supervision General bed mobility comments: supervision with cues for log roll    Transfers Overall transfer level: Modified independent Equipment  used: Rolling walker (2 wheels), None               General transfer comment: with and without RW, pt stable    Ambulation/Gait Ambulation/Gait assistance: Supervision Gait Distance (Feet): 500 Feet Assistive device: Rolling walker (2 wheels) Gait Pattern/deviations: Step-through pattern, Decreased stride length Gait velocity: decreased Gait velocity interpretation: <1.31 ft/sec, indicative of household ambulator   General Gait Details: pt grossly functional, no overt LOB or buckling, able to walk with single UE support  Stairs Stairs: Yes   Stair Management: No rails, Step to pattern, Backwards, With walker Number of Stairs: 3 (x2) General stair comments: great stability and understanding of technique     Balance Overall balance assessment: Mild deficits observed, not formally tested                                           Pertinent Vitals/Pain Pain Assessment Pain Assessment: Faces Pain Score: 3  Faces Pain Scale: Hurts a little bit Pain Location: incision Pain Descriptors / Indicators: Discomfort, Grimacing Pain Intervention(s): Limited activity within patient's tolerance, Monitored during session, Repositioned    Home Living Family/patient expects to be discharged to:: Private residence Living Arrangements: Spouse/significant other Available Help at Discharge: Family Type of Home: House Home Access: Stairs to enter Entrance Stairs-Rails: None Entrance Stairs-Number of Steps: 2   Home Layout: Two level;Able to live on main level with bedroom/bathroom Home Equipment: Rolling Walker (2 wheels);Rollator (4 wheels);Cane - single point;BSC/3in1;Shower seat      Prior Function Prior Level of Function :  Needs assist             Mobility Comments: no DME, no falls, limited in activity by pain 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     Extremity/Trunk Assessment   Upper Extremity Assessment Upper Extremity  Assessment: Defer to OT evaluation    Lower Extremity Assessment Lower Extremity Assessment: RLE deficits/detail;LLE deficits/detail RLE Deficits / Details: grossly 5/5 to MMT at ankle, knee, and hip. hx or neuropathy to knee RLE Sensation: history of peripheral neuropathy RLE Coordination: WNL LLE Deficits / Details: grossly 5/5 to MMT at ankle, knee, and hip. hx or neuropathy to knee LLE Sensation: history of peripheral neuropathy LLE Coordination: WNL    Cervical / Trunk Assessment Cervical / Trunk Assessment: Back Surgery  Communication   Communication Communication: No apparent difficulties Cueing Techniques: Verbal cues  Cognition Arousal: Alert Behavior During Therapy: WFL for tasks assessed/performed Overall Cognitive Status: Within Functional Limits for tasks assessed                                          General Comments General comments (skin integrity, edema, etc.): VSS, all education completed    Exercises     Assessment/Plan    PT Assessment Patient does not need any further PT services         PT Goals (Current goals can be found in the Care Plan section)  Acute Rehab PT Goals Patient Stated Goal: return to pain-free mobility PT Goal Formulation: All assessment and education complete, DC therapy Time For Goal Achievement: 04/02/23 Potential to Achieve Goals: Good     AM-PAC PT "6 Clicks" Mobility  Outcome Measure Help needed turning from your back to your side while in a flat bed without using bedrails?: None Help needed moving from lying on your back to sitting on the side of a flat bed without using bedrails?: None Help needed moving to and from a bed to a chair (including a wheelchair)?: A Little Help needed standing up from a chair using your arms (e.g., wheelchair or bedside chair)?: A Little Help needed to walk in hospital room?: A Little Help needed climbing 3-5 steps with a railing? : A Little 6 Click Score: 20    End  of Session Equipment Utilized During Treatment: Back brace Activity Tolerance: Patient tolerated treatment well;No increased pain Patient left: in bed;with call bell/phone within reach Nurse Communication: Mobility status PT Visit Diagnosis: Unsteadiness on feet (R26.81);Other abnormalities of gait and mobility (R26.89);Muscle weakness (generalized) (M62.81);Pain Pain - part of body:  (back)    Time: 1610-9604 PT Time Calculation (min) (ACUTE ONLY): 23 min   Charges:   PT Evaluation $PT Eval Low Complexity: 1 Low   PT General Charges $$ ACUTE PT VISIT: 1 Visit         Vickki Muff, PT, DPT   Acute Rehabilitation Department Office [phone] Secure Chat Communication Preferred  Ronnie Derby 03/19/2023, 10:32 AM

## 2023-03-19 NOTE — Evaluation (Signed)
Occupational Therapy Evaluation Patient Details Name: Dakota Gibbs MRN: [id number] DOB: March 13, 1951 Today's Date: 03/19/2023   History of Present Illness The pt is a 72 yo male presenting 11/13 for decompressive laminotomy and PLIF L3-4. PMH includes: L TKA 08/13/2022, anxiety, depression, back surgery 2023, CAD, DM II, HLD, HTN, MI, and OSA.   Clinical Impression   Nadine Counts was evaluated s/p the above spine surgery. He is indep and works at baseline. Upon evaluation pt was limited by surgical pain, spinal precautions and limited activity tolerance. Overall he demonstrated mod I ability to complete ADLs and functional mobility both with and without AD. Pt did need assist to don his compression stockings, he reports his spouse can assist at discharge, also educated on AE to purchase out of pocket to become mod I with the task. Provided cues and education on spinal precautions and compensatory techniques throughout, handout provided and pt demonstrated great recall during ADLs and mobility. Pt does not require further acute OT services. Recommend d/c home with support of family.         If plan is discharge home, recommend the following: Assist for transportation;A little help with bathing/dressing/bathroom    Functional Status Assessment  Patient has had a recent decline in their functional status and demonstrates the ability to make significant improvements in function in a reasonable and predictable amount of time.  Equipment Recommendations  None recommended by OT       Precautions / Restrictions Precautions Precautions: Back Precaution Booklet Issued: Yes (comment) Required Braces or Orthoses: Spinal Brace Spinal Brace: Lumbar corset;Applied in sitting position Restrictions Weight Bearing Restrictions: No      Mobility Bed Mobility Overal bed mobility: Needs Assistance             General bed mobility comments: OOB upon arrival, recalled log roll well     Transfers Overall transfer level: Modified independent                 General transfer comment: no AD, several functional transfers throughout          ADL either performed or assessed with clinical judgement   ADL Overall ADL's : Needs assistance/impaired                     Lower Body Dressing: Minimal assistance Lower Body Dressing Details (indicate cue type and reason): min A for LB dressing due to compression stockings             Functional mobility during ADLs: Modified independent General ADL Comments: pt demonstrated mod I ability to complete all ADLs with the exception of donning his compression stockings. Pt states his wife can assist with them at home, educated on AE options to purchase out of pocket     Vision Baseline Vision/History: 0 No visual deficits Vision Assessment?: No apparent visual deficits     Perception Perception: Within Functional Limits       Praxis Praxis: WFL       Pertinent Vitals/Pain Pain Assessment Pain Assessment: 0-10 Pain Score: 3  Faces Pain Scale: Hurts a little bit Pain Location: incision Pain Descriptors / Indicators: Discomfort, Grimacing Pain Intervention(s): Limited activity within patient's tolerance     Extremity/Trunk Assessment Upper Extremity Assessment Upper Extremity Assessment: Defer to OT evaluation   Lower Extremity Assessment Lower Extremity Assessment: RLE deficits/detail;LLE deficits/detail RLE Deficits / Details: grossly 5/5 to MMT at ankle, knee, and hip. hx or neuropathy to knee RLE Sensation: history of peripheral neuropathy  RLE Coordination: WNL LLE Deficits / Details: grossly 5/5 to MMT at ankle, knee, and hip. hx or neuropathy to knee LLE Sensation: history of peripheral neuropathy LLE Coordination: WNL   Cervical / Trunk Assessment Cervical / Trunk Assessment: Back Surgery   Communication Communication Communication: No apparent difficulties Cueing Techniques: Verbal  cues   Cognition Arousal: Alert Behavior During Therapy: WFL for tasks assessed/performed Overall Cognitive Status: Within Functional Limits for tasks assessed               General Comments: great recall of precuations throughout     General Comments  VSS, all education completed     Home Living Family/patient expects to be discharged to:: Private residence Living Arrangements: Spouse/significant other Available Help at Discharge: Family Type of Home: House Home Access: Stairs to enter Secretary/administrator of Steps: 2 Entrance Stairs-Rails: None Home Layout: Two level;Able to live on main level with bedroom/bathroom     Bathroom Shower/Tub: Producer, television/film/video: Standard     Home Equipment: Agricultural consultant (2 wheels);Rollator (4 wheels);Cane - single point;BSC/3in1;Shower seat          Prior Functioning/Environment Prior Level of Function : Needs assist             Mobility Comments: no DME, no falls, limited in activity by pain 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        OT Problem List: Decreased activity tolerance;Pain         OT Goals(Current goals can be found in the care plan section) Acute Rehab OT Goals Patient Stated Goal: home OT Goal Formulation: With patient Time For Goal Achievement: 03/19/23 Potential to Achieve Goals: Good   AM-PAC OT "6 Clicks" Daily Activity     Outcome Measure Help from another person eating meals?: None Help from another person taking care of personal grooming?: None Help from another person toileting, which includes using toliet, bedpan, or urinal?: None Help from another person bathing (including washing, rinsing, drying)?: None Help from another person to put on and taking off regular upper body clothing?: None Help from another person to put on and taking off regular lower body clothing?: A Little 6 Click Score: 23   End of Session Equipment Utilized During Treatment:  Back brace Nurse Communication: Mobility status  Activity Tolerance: Patient tolerated treatment well Patient left: in bed;with call bell/phone within reach  OT Visit Diagnosis: Muscle weakness (generalized) (M62.81);Pain;Other abnormalities of gait and mobility (R26.89)                Time: 4098-1191 OT Time Calculation (min): 16 min Charges:  OT General Charges $OT Visit: 1 Visit OT Evaluation $OT Eval Low Complexity: 1 Low  Derenda Mis, OTR/L Acute Rehabilitation Services Office [phone] Secure Chat Communication Preferred   Donia Pounds 03/19/2023, 10:40 AM

## 2023-03-19 NOTE — Discharge Summary (Addendum)
Physician Discharge Summary  Patient ID: KAYDENCE KESTER MRN: [id number] DOB/AGE: [birth year] 72 y.o.  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 The patient tolerated the procedure well and was taken to the recovery room and then to the floor in stable condition. The hospital course was routine. There were no complications. The wound remained clean dry and intact. Pt had appropriate back soreness. No complaints of leg pain or new N/T/W. The patient remained afebrile with stable vital signs, and tolerated a regular diet. The patient continued to increase activities, and pain was well controlled with oral pain medications.   Consults: None  Significant Diagnostic Studies:  Results for orders placed or performed during the hospital encounter of 03/18/23  Glucose, capillary  Result Value Ref Range   Glucose-Capillary 112 (H) 70 - 99 mg/dL  Glucose, capillary  Result Value Ref Range   Glucose-Capillary 110 (H) 70 - 99 mg/dL  Glucose, capillary  Result Value Ref Range   Glucose-Capillary 125 (H) 70 - 99 mg/dL  Glucose, capillary  Result Value Ref Range   Glucose-Capillary 203 (H) 70 - 99 mg/dL  Glucose, capillary  Result Value Ref Range   Glucose-Capillary 254 (H) 70 - 99 mg/dL   Comment 1 Notify RN    Comment 2 Document in Chart   Glucose, capillary  Result Value Ref Range   Glucose-Capillary 215 (H) 70 - 99 mg/dL   Comment 1 Notify RN    Comment 2 Document in Chart   ABO/Rh  Result Value Ref Range   ABO/RH(D)      A NEG Performed at [facility], [address] [place], [place] [address]     DG  Lumbar Spine 2-3 Views  Result Date: 03/18/2023 CLINICAL DATA:  Posterior lumbar interbody fusion at L3-L4. EXAM: LUMBAR SPINE - 2-3 VIEW COMPARISON:  None Available. FINDINGS: Five intraoperative fluoroscopic spot images provided. The total fluoroscopic time is 44 seconds with a cumulative air Karma of 30.01 mGy. Status post L3-L4 PLIF. IMPRESSION: Intraoperative fluoroscopic spot images. Electronically Signed   By: Elgie Collard M.D.   On: 03/18/2023 16:49   DG C-Arm 1-60 Min-No Report  Result Date: 03/18/2023 Fluoroscopy was utilized by the requesting physician.  No radiographic interpretation.   DG C-Arm 1-60 Min-No Report  Result Date: 03/18/2023 Fluoroscopy was utilized by the requesting physician.  No radiographic interpretation.    Antibiotics:  Anti-infectives (From admission, onward)    Start     Dose/Rate Route Frequency Ordered Stop   03/18/23 2000  ceFAZolin (ANCEF) IVPB 2g/100 mL premix        2 g 200 mL/hr over 30 Minutes Intravenous Every 8 hours 03/18/23 1504 03/19/23 0249   03/18/23 0815  ceFAZolin (ANCEF) IVPB 2g/100 mL premix        2 g 200 mL/hr over 30 Minutes Intravenous On call to O.R. 03/18/23 0806 03/18/23 1116   03/18/23 0809  ceFAZolin (ANCEF) 2-4 GM/100ML-% IVPB       Note to Pharmacy: Patsi Sears E: cabinet override      03/18/23 0809 03/18/23 1116       Discharge Exam: Blood pressure (!) 153/59, pulse 66, temperature 98.8 F (37.1  C), temperature source Oral, resp. rate 18, height 5\' 10"  (1.778 m), weight 90.7 kg, SpO2 95%. Neurologic: Grossly normal Ambulating and voiding well incision cdi   Discharge Medications:   Allergies as of 03/19/2023       Reactions   Hydrocodone Shortness Of Breath, Nausea And Vomiting, Nausea Only, Anxiety, Other (See Comments)   Will take with Anti-Nausea medication   Oxycodone Diarrhea, Nausea Only        Medication List     STOP taking these medications    traMADol 50 MG tablet Commonly known  as: ULTRAM       TAKE these medications    amLODipine 5 MG tablet Commonly known as: NORVASC Take 0.5 tablets (2.5 mg total) by mouth daily.   ascorbic acid 500 MG tablet Commonly known as: VITAMIN C Take 500 mg by mouth daily.   aspirin EC 81 MG tablet Take 81 mg by mouth daily. Swallow whole.   atorvastatin 20 MG tablet Commonly known as: LIPITOR Take 1 tablet (20 mg total) by mouth daily.   cetirizine 10 MG tablet Commonly known as: ZYRTEC Take 10 mg by mouth daily.   chlorthalidone 25 MG tablet Commonly known as: HYGROTON Take 0.5 tablets (12.5 mg total) by mouth daily.   citalopram 20 MG tablet Commonly known as: CELEXA ( GENERIC FOR CELEXA) TAKE ONE AND ONE-HALF TABLETS BY MOUTH DAILY   ezetimibe 10 MG tablet Commonly known as: ZETIA TAKE ONE TABLET BY MOUTH DAILY What changed: when to take this   HYDROcodone-acetaminophen 5-325 MG tablet Commonly known as: NORCO/VICODIN Take 1 tablet by mouth every 4 (four) hours as needed for moderate pain (pain score 4-6).   metFORMIN 500 MG 24 hr tablet Commonly known as: GLUCOPHAGE-XR TAKE ONE TABLET BY MOUTH EVERY DAY WITH BREAKFAST   methocarbamol 500 MG tablet Commonly known as: ROBAXIN Take 1 tablet (500 mg total) by mouth every 6 (six) hours as needed for muscle spasms.   multivitamin tablet Take 1 tablet by mouth daily.   nitroGLYCERIN 0.4 MG SL tablet Commonly known as: NITROSTAT For chest pain, tightness, or pressure. While sitting, place 1 tablet under tongue. May be used every 5 minutes as needed, for up to 15 minutes. Do not use more than 3 tablets.   olmesartan 40 MG tablet Commonly known as: BENICAR Take 1 tablet (40 mg total) by mouth at bedtime.   ondansetron 4 MG tablet Commonly known as: Zofran Take 1 tablet (4 mg total) by mouth every 8 (eight) hours as needed for nausea or vomiting.   oxymetazoline 0.05 % nasal spray Commonly known as: AFRIN Place 1 spray into both nostrils 2 (two)  times daily.   pregabalin 50 MG capsule Commonly known as: Lyrica Take 1 capsule (50 mg total) by mouth daily. What changed: when to take this   pyridOXINE 100 MG tablet Commonly known as: VITAMIN B6 Take 100 mg by mouth daily.   trolamine salicylate 10 % cream Commonly known as: ASPERCREME Apply 1 Application topically as needed for muscle pain.   vitamin E 180 MG (400 UNITS) capsule Take 400 Units by mouth daily.               Durable Medical Equipment  (From admission, onward)           Start     Ordered   03/18/23 1505  DME Walker rolling  Once       Question:  Patient needs a walker to treat with the following  condition  Answer:  S/P lumbar fusion   03/18/23 1504   03/18/23 1505  DME 3 n 1  Once        03/18/23 1504            Disposition: home   Final Dx: PLIF L3-4  Discharge Instructions      Remove dressing in 72 hours   Complete by: As directed    Call MD for:   Complete by: As directed    Call MD for:  difficulty breathing, headache or visual disturbances   Complete by: As directed    Call MD for:  hives   Complete by: As directed    Call MD for:  persistant dizziness or light-headedness   Complete by: As directed    Call MD for:  persistant nausea and vomiting   Complete by: As directed    Call MD for:  redness, tenderness, or signs of infection (pain, swelling, redness, odor or green/yellow discharge around incision site)   Complete by: As directed    Call MD for:  severe uncontrolled pain   Complete by: As directed    Call MD for:  temperature >100.4   Complete by: As directed    Diet - low sodium heart healthy   Complete by: As directed    Driving Restrictions   Complete by: As directed    No driving for 2 weeks, no riding in the car for 1 week   Increase activity slowly   Complete by: As directed    Lifting restrictions   Complete by: As directed    No lifting more than 8 lbs          Signed: Tiana Loft  Prudy Candy 03/19/2023, 7:46 AM

## 2023-03-19 NOTE — Plan of Care (Signed)
Pt doing well. Pt given D/C instructions with verbal understanding. Rx's were delivered to the Pt's room via nurse from [facility] pharmacy prior to D/C. Pt's incision is clean and dry with no sign of infection. Pt's IV was removed prior to D/C. Pt D/C'd home via wheelchair per MD order. Pt is stable @ D/C and has no other needs at this time. [name], RN

## 2023-04-05 DIAGNOSIS — E119 Type 2 diabetes mellitus without complications: Secondary | ICD-10-CM | POA: Diagnosis not present

## 2023-04-06 NOTE — Therapy (Unsigned)
OUTPATIENT PHYSICAL THERAPY THORACOLUMBAR EVALUATION   Patient Name: Dakota Gibbs MRN: [id number] DOB:1951-02-24, 72 y.o., male Today's Date: 04/07/2023  END OF SESSION:  PT End of Session - 04/07/23 1408     Visit Number 1    Date for PT Re-Evaluation 06/30/23    PT Start Time 1316    PT Stop Time 1350    PT Time Calculation (min) 34 min    Activity Tolerance Patient tolerated treatment well;No increased pain    Behavior During Therapy [facility] for tasks assessed/performed             Past Medical History:  Diagnosis Date   Allergy    enviornmental   Anxiety    Anxiety and depression 10/25/2012   Arthritis    self dx   Back pain    L4-L5 bulging disc, L5-S1 - bulging disc, pinched nerve in neck   Cataract    bilateral sx   Coronary artery disease    hx of balloon angioplasty with DR T Tresa Endo   Depression    Diabetes mellitus    type II   Dyspnea    History of shingles 2009   Hyperlipidemia    on meds   Hypertension    on meds   Myocardial infarction (HCC) 1997   caused by a blood clot   Neuropathy    Sleep apnea    cpap   Past Surgical History:  Procedure Laterality Date   ANGIOPLASTY  1997   BACK SURGERY  04/02/2022   basel cell     skin carcinoma removed x 4   BILATERAL CARPAL TUNNEL RELEASE  2023   CARDIAC CATHETERIZATION     10/04/1999   COLONOSCOPY  2018   SA-MAC-suprep(good)-TA   COSMETIC SURGERY     right wrist - removal of keloid   DOPPLER ECHOCARDIOGRAPHY  07/09/2006   EF 50 to 55 %, LA mildy dilated   HERNIA REPAIR  1951   RIGHT   KNEE ARTHROSCOPY Right 2001   R   NM MYOCAR PERF WALL MOTION  08/22/2011   Mets 13,low risk study   POLYPECTOMY  2018   TA   TOTAL KNEE ARTHROPLASTY Left 08/13/2022   Procedure: TOTAL KNEE ARTHROPLASTY;  Surgeon: Jene Every, MD;  Location: [facility];  Service: Orthopedics;  Laterality: Left;   WRIST SURGERY  1984   right   Patient Active Problem List   Diagnosis Date Noted   S/P lumbar fusion  03/18/2023   Macrocytic anemia 03/17/2023   Lumbosacral radiculopathy at L5 12/31/2022   Thiamine deficiency 10/22/2022   PVC (premature ventricular contraction) 08/18/2022   Rash of back 08/18/2022   Sepsis (HCC) 08/17/2022   Hypokalemia 08/17/2022   Fever postop 08/16/2022   Hyponatremia 08/16/2022   SOB (shortness of breath) 08/16/2022   S/P TKR (total knee replacement) using cement, left 08/13/2022   Cardiomegaly 06/23/2022   Hepatic steatosis 06/23/2022   Abdominal bloating 06/02/2022   Leg pain 02/18/2022   Injury of right shoulder 02/17/2022   Large fiber neuropathy 02/17/2022   Need for influenza vaccination 02/17/2022   Prediabetes 12/03/2021   Bilateral carpal tunnel syndrome 10/01/2021   Abnormal urinalysis 06/10/2021   Receptive aphasia 05/30/2021   Memory loss 05/30/2021   Cough due to ACE inhibitor 08/27/2020   Cough 08/03/2020   Lower abdominal pain 08/03/2020   Benign prostatic hyperplasia with post-void dribbling 08/03/2020   Paresthesias 08/03/2020   Normocytic anemia 08/03/2020   Alcohol use 07/22/2019   Primary  hypertension 07/22/2019   Basal cell carcinoma (BCC) in situ of skin 05/14/2017   Sinus bradycardia 03/15/2015   Coronary artery disease involving native coronary artery of native heart without angina pectoris 03/15/2015   PCP NOTES >>> 02/21/2015   Hyperlipidemia with target LDL less than 70 06/08/2013   Palpitations 06/08/2013   DJD (degenerative joint disease) 03/16/2013   Healthcare maintenance 03/16/2013   Anxiety and depression 10/25/2012   Lipoma 03/20/2011   DM2 (diabetes mellitus, type 2) (HCC) 07/10/2009   ERECTILE DYSFUNCTION 02/21/2008   CORONARY ARTERY DISEASE 02/21/2008   GERD 02/21/2008    PCP: Mliss Sax, MD  REFERRING PROVIDER: Arman Bogus, MD   REFERRING DIAG: Lumbar radiculopathy  Rationale for Evaluation and Treatment: Rehabilitation  THERAPY DIAG:  Difficulty in walking, not elsewhere  classified  Weakness of both legs  History of lumbar laminectomy for spinal cord decompression  ONSET DATE: 03/19/23  SUBJECTIVE:                                                                                                                                                                                           SUBJECTIVE STATEMENT: Patient arrives without his back brace. States the Dr told him it was optional. Since then he has been feeling pretty good. He reports that the pain gets a little better every day. Only takes pain meds when he goes to bed. When walking, his legs feel heavy/weak after about 10 minutes and he gets a small amount of pain into buttocks. He recently noted increased R knee pain and was told he needs new knee, but received an injection to prolong the need for the surgery.   PERTINENT HISTORY:  Per inpatient PT note: The pt is a 72 yo male presenting 11/13 for decompressive laminotomy and PLIF L3-4. PMH includes: Lumbar laminectomy 2023 which failed. L TKA 08/13/2022, anxiety, depression, back surgery 2023, CAD, DM II, HLD, HTN, MI, and OSA.   PAIN:  Are you having pain? Yes: NPRS scale: up until 2days ago it was severe, but in the past 2 days 0/10 Pain location: back Pain description: sharp Aggravating factors: N/A Relieving factors: The surgery has relieved all of his radiating pain, but still has some PN.  PRECAUTIONS: Back and Other: lumbar corset applied in sitting , Dr told him to only wear it if it makes him feel better.  RED FLAGS: None   WEIGHT BEARING RESTRICTIONS: No  FALLS:  Has patient fallen in last 6 months? 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  PLOF: Independent  PATIENT GOALS: Patient would like to improve his strength in legs to be able to walk at least a mile without pain, carry a suitcase. Scheduled to go on a cruise on 05/10/23  NEXT MD VISIT: 05/07/23  OBJECTIVE:  Note: Objective measures were completed at Evaluation unless otherwise noted.  DIAGNOSTIC FINDINGS:  None  COGNITION: Overall cognitive status: Within functional limits for tasks assessed     SENSATION: Patient reports no more N & T in his legs immediately after surgery, but still has some PN  MUSCLE LENGTH: Hamstrings: Right 60 deg; Left 60 deg   POSTURE: decreased lumbar lordosis and decreased thoracic kyphosis  PALPATION: No TTP over incision, small area of edema at the top of incision, but no redness.  LUMBAR ROM: Deferred due to precautions.  LOWER EXTREMITY ROM:   B hips mildly limited in all planes, otherwise WNL   LOWER EXTREMITY MMT:  B LE strength MMT at 5/5. Functional strength and muscular endurance both diminished. Patient reports BLE pain and weakness after 10 minutes of walking.  LUMBAR SPECIAL TESTS:  N/T GAIT: Distance walked: In clinic distances Assistive device utilized: None Level of assistance: Modified independence Comments: Patient reports he is limited to walking < 10 minutes  TODAY'S TREATMENT:                                                                                                                              DATE:  04/07/23 Education    PATIENT EDUCATION:  Education details: POC, abdominal bracing Person educated: Patient Education method: Explanation Education comprehension: verbalized understanding  HOME EXERCISE PROGRAM:  [facility]  ASSESSMENT:  CLINICAL IMPRESSION: Patient is a 72 y.o. who was seen today for physical therapy evaluation and treatment for rehab following decompressive laminotomy and PLIF L3-4 on 03/18/23. He  presents without his lumbar corset, back precautions. He says the Dr told him it was optional to wear the corset. His pain is 90% resolved since his surgery, no longer moving into legs. His MMT strength is normal. However, he reports great difficulty in walking, limited to < 10 minutes due to pain and weakness in his legs. His functional strength and muscular endurance are both limited. Initiated HEP for stability in trunk and LE functional strengthening. He will benefit from continued PT to progress him through his recovery, with increasing strength and mobility challenges and HEP updates so that he may reach his goal of return to PLOF, walking x > 1 mile and being able to lift and move about as before without pain. His activity will increase as his back precautions are removed. Educated to the importance of following his precautions as he continues  to feel better.  OBJECTIVE IMPAIRMENTS: Abnormal gait, decreased activity tolerance, decreased balance, decreased coordination, decreased mobility, difficulty walking, decreased ROM, decreased strength, impaired flexibility, impaired UE functional use, improper body mechanics, and pain.   ACTIVITY LIMITATIONS: carrying, lifting, bending, standing, squatting, sleeping, stairs, transfers, and locomotion level  PARTICIPATION LIMITATIONS: meal prep, cleaning, laundry, driving, shopping, community activity, occupation, and yard work  PERSONAL FACTORS: Past/current experiences are also affecting patient's functional outcome.   REHAB POTENTIAL: Good  CLINICAL DECISION MAKING: Evolving/moderate complexity  EVALUATION COMPLEXITY: Low   GOALS: Goals reviewed with patient? Yes  SHORT TERM GOALS: Target date: 04/22/23  I with initial HEP Baseline: Goal status: INITIAL  LONG TERM GOALS: Target date: 06/30/23  I with final HEP Baseline:  Goal status: INITIAL  2.  Patient will be able to walk > 1 mile with no increase in pain or weakness noted in  BLE Baseline: < 10 minutes Goal status: INITIAL  3.  Patient will be able to carry at least 30# while maintaining upright, stable posture, with no pain. Baseline:  Goal status: INITIAL  4.  Patient will demonstrate improved functional LE strength and endurance by maintaining a 1/4-1/3 wall squat for > 1 minute. Baseline:  Goal status: INITIAL  5.  Up and down at least 12 steps using step over step, no UE support Baseline:  Goal status: INITIAL PLAN:  PT FREQUENCY: 1x/week  PT DURATION: 12 weeks  PLANNED INTERVENTIONS: 97110-Therapeutic exercises, 97530- Therapeutic activity, 97112- Neuromuscular re-education, 97535- Self Care, 29562- Manual therapy, [phone]- Gait training, Patient/Family education, Balance training, Stair training, Taping, Joint mobilization, Cryotherapy, and Moist heat.  PLAN FOR NEXT SESSION: Update HEP as appropriate with functional strengthening activities, mildly challenge balance and proprioception.   Iona Beard, DPT 04/07/2023, 2:16 PM

## 2023-04-07 ENCOUNTER — Encounter: Payer: Self-pay | Admitting: Physical Therapy

## 2023-04-07 ENCOUNTER — Ambulatory Visit: Payer: Medicare Other | Attending: Neurological Surgery | Admitting: Physical Therapy

## 2023-04-07 DIAGNOSIS — Z9889 Other specified postprocedural states: Secondary | ICD-10-CM | POA: Diagnosis not present

## 2023-04-07 DIAGNOSIS — R29898 Other symptoms and signs involving the musculoskeletal system: Secondary | ICD-10-CM | POA: Insufficient documentation

## 2023-04-07 DIAGNOSIS — R262 Difficulty in walking, not elsewhere classified: Secondary | ICD-10-CM | POA: Insufficient documentation

## 2023-04-11 ENCOUNTER — Other Ambulatory Visit: Payer: Self-pay | Admitting: Family Medicine

## 2023-04-11 DIAGNOSIS — F32A Depression, unspecified: Secondary | ICD-10-CM

## 2023-04-16 ENCOUNTER — Ambulatory Visit: Payer: Medicare Other | Admitting: Physical Therapy

## 2023-04-16 ENCOUNTER — Encounter: Payer: Self-pay | Admitting: Physical Therapy

## 2023-04-16 DIAGNOSIS — Z9889 Other specified postprocedural states: Secondary | ICD-10-CM

## 2023-04-16 DIAGNOSIS — R262 Difficulty in walking, not elsewhere classified: Secondary | ICD-10-CM | POA: Diagnosis not present

## 2023-04-16 DIAGNOSIS — R29898 Other symptoms and signs involving the musculoskeletal system: Secondary | ICD-10-CM | POA: Diagnosis not present

## 2023-04-16 NOTE — Therapy (Signed)
OUTPATIENT PHYSICAL THERAPY THORACOLUMBAR EVALUATION   Patient Name: Dakota Gibbs MRN: [id number] DOB:12/18/1950, 72 y.o., male Today's Date: 04/16/2023  END OF SESSION:  PT End of Session - 04/16/23 0845     Visit Number 2    Date for PT Re-Evaluation 06/30/23    PT Start Time 0845    PT Stop Time 0930    PT Time Calculation (min) 45 min    Activity Tolerance Patient tolerated treatment well;No increased pain    Behavior During Therapy [facility] for tasks assessed/performed             Past Medical History:  Diagnosis Date   Allergy    enviornmental   Anxiety    Anxiety and depression 10/25/2012   Arthritis    self dx   Back pain    L4-L5 bulging disc, L5-S1 - bulging disc, pinched nerve in neck   Cataract    bilateral sx   Coronary artery disease    hx of balloon angioplasty with DR T Tresa Endo   Depression    Diabetes mellitus    type II   Dyspnea    History of shingles 2009   Hyperlipidemia    on meds   Hypertension    on meds   Myocardial infarction (HCC) 1997   caused by a blood clot   Neuropathy    Sleep apnea    cpap   Past Surgical History:  Procedure Laterality Date   ANGIOPLASTY  1997   BACK SURGERY  04/02/2022   basel cell     skin carcinoma removed x 4   BILATERAL CARPAL TUNNEL RELEASE  2023   CARDIAC CATHETERIZATION     10/04/1999   COLONOSCOPY  2018   SA-MAC-suprep(good)-TA   COSMETIC SURGERY     right wrist - removal of keloid   DOPPLER ECHOCARDIOGRAPHY  07/09/2006   EF 50 to 55 %, LA mildy dilated   HERNIA REPAIR  1951   RIGHT   KNEE ARTHROSCOPY Right 2001   R   NM MYOCAR PERF WALL MOTION  08/22/2011   Mets 13,low risk study   POLYPECTOMY  2018   TA   TOTAL KNEE ARTHROPLASTY Left 08/13/2022   Procedure: TOTAL KNEE ARTHROPLASTY;  Surgeon: Jene Every, MD;  Location: [facility];  Service: Orthopedics;  Laterality: Left;   WRIST SURGERY  1984   right   Patient Active Problem List   Diagnosis Date Noted   S/P lumbar fusion  03/18/2023   Macrocytic anemia 03/17/2023   Lumbosacral radiculopathy at L5 12/31/2022   Thiamine deficiency 10/22/2022   PVC (premature ventricular contraction) 08/18/2022   Rash of back 08/18/2022   Sepsis (HCC) 08/17/2022   Hypokalemia 08/17/2022   Fever postop 08/16/2022   Hyponatremia 08/16/2022   SOB (shortness of breath) 08/16/2022   S/P TKR (total knee replacement) using cement, left 08/13/2022   Cardiomegaly 06/23/2022   Hepatic steatosis 06/23/2022   Abdominal bloating 06/02/2022   Leg pain 02/18/2022   Injury of right shoulder 02/17/2022   Large fiber neuropathy 02/17/2022   Need for influenza vaccination 02/17/2022   Prediabetes 12/03/2021   Bilateral carpal tunnel syndrome 10/01/2021   Abnormal urinalysis 06/10/2021   Receptive aphasia 05/30/2021   Memory loss 05/30/2021   Cough due to ACE inhibitor 08/27/2020   Cough 08/03/2020   Lower abdominal pain 08/03/2020   Benign prostatic hyperplasia with post-void dribbling 08/03/2020   Paresthesias 08/03/2020   Normocytic anemia 08/03/2020   Alcohol use 07/22/2019   Primary  hypertension 07/22/2019   Basal cell carcinoma (BCC) in situ of skin 05/14/2017   Sinus bradycardia 03/15/2015   Coronary artery disease involving native coronary artery of native heart without angina pectoris 03/15/2015   PCP NOTES >>> 02/21/2015   Hyperlipidemia with target LDL less than 70 06/08/2013   Palpitations 06/08/2013   DJD (degenerative joint disease) 03/16/2013   Healthcare maintenance 03/16/2013   Anxiety and depression 10/25/2012   Lipoma 03/20/2011   DM2 (diabetes mellitus, type 2) (HCC) 07/10/2009   ERECTILE DYSFUNCTION 02/21/2008   CORONARY ARTERY DISEASE 02/21/2008   GERD 02/21/2008    PCP: Mliss Sax, MD  REFERRING PROVIDER: Arman Bogus, MD   REFERRING DIAG: Lumbar radiculopathy  Rationale for Evaluation and Treatment: Rehabilitation  THERAPY DIAG:  Difficulty in walking, not elsewhere  classified  History of lumbar laminectomy for spinal cord decompression  Weakness of both legs  ONSET DATE: 03/19/23  SUBJECTIVE:                                                                                                                                                                                           SUBJECTIVE STATEMENT: Doing fine, "Cheeks light up after a quarter mile of walking" can stop to resolve symptome PERTINENT HISTORY:  Per inpatient PT note: The pt is a 72 yo male presenting 11/13 for decompressive laminotomy and PLIF L3-4. PMH includes: Lumbar laminectomy 2023 which failed. L TKA 08/13/2022, anxiety, depression, back surgery 2023, CAD, DM II, HLD, HTN, MI, and OSA.   PAIN:  Are you having pain? Yes: NPRS scale:  0/10 Pain location: back Pain description: sharp Aggravating factors: N/A Relieving factors: The surgery has relieved all of his radiating pain, but still has some PN.  PRECAUTIONS: Back and Other: lumbar corset applied in sitting , Dr told him to only wear it if it makes him feel better.  RED FLAGS: None   WEIGHT BEARING RESTRICTIONS: No  FALLS:  Has patient fallen in last 6 months? 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  PLOF: Independent  PATIENT GOALS: Patient would like to improve his strength in legs to be able to walk at least a mile without pain, carry a suitcase. Scheduled to go on a cruise on 05/10/23  NEXT MD  VISIT: 05/07/23  OBJECTIVE:  Note: Objective measures were completed at Evaluation unless otherwise noted.  DIAGNOSTIC FINDINGS:  None  COGNITION: Overall  cognitive status: Within functional limits for tasks assessed     SENSATION: Patient reports no more N & T in his legs immediately after surgery, but still has some PN  MUSCLE LENGTH: Hamstrings: Right 60 deg; Left 60 deg   POSTURE: decreased lumbar lordosis and decreased thoracic kyphosis  PALPATION: No TTP over incision, small area of edema at the top of incision, but no redness.  LUMBAR ROM: Deferred due to precautions.  LOWER EXTREMITY ROM:   B hips mildly limited in all planes, otherwise WNL   LOWER EXTREMITY MMT:  B LE strength MMT at 5/5. Functional strength and muscular endurance both diminished. Patient reports BLE pain and weakness after 10 minutes of walking.  LUMBAR SPECIAL TESTS:  N/T GAIT: Distance walked: In clinic distances Assistive device utilized: None Level of assistance: Modified independence Comments: Patient reports he is limited to walking < 10 minutes  TODAY'S TREATMENT:                                                                                                                              DATE:  04/16/23 NuStep L5 x 6 min S2S holding yellow ball 2x10 Shoulder Ext 10lb 2x10 Seated rows & Lats 25lb 2x10 6in step ups x10  HS curls 35lb 2x10, LLE 20lb 2x10 Leg Ext 10lb 2x10, LLE 5lb 2x10 Heel raises 04/07/23 Education    PATIENT EDUCATION:  Education details: POC, abdominal bracing Person educated: Patient Education method: Explanation Education comprehension: verbalized understanding  HOME EXERCISE PROGRAM:  [facility]  ASSESSMENT:  CLINICAL IMPRESSION: Patient is a 72 y.o. who was seen today for physical therapy treatment for rehab following decompressive laminotomy and PLIF L3-4 on 03/18/23. He presents without his lumbar corset, back precautions. Pt tolerated an initial progression of TE well evident bu no subjective reports of increase pain. Cue for glute engagement needed with sit tos stands. L Hip weakness present with 6 in step  ups. LLE appears to be weakness than the right with leg curls and extensions, so time spent isolating the LLE. No reports of increase pain during session.He will benefit from continued PT to progress him through his recovery, with increasing strength and mobility challenges and HEP updates so that he may reach his goal of return to PLOF, walking x > 1 mile and being able to lift and move about as before without pain. His activity will increase as his back precautions are removed. Educated to the importance of following his precautions as he continues to feel better.  OBJECTIVE IMPAIRMENTS: Abnormal gait, decreased activity tolerance, decreased balance, decreased coordination, decreased mobility, difficulty walking, decreased ROM, decreased strength, impaired flexibility, impaired UE functional use, improper body mechanics, and pain.   ACTIVITY LIMITATIONS: carrying, lifting, bending, standing, squatting, sleeping, stairs, transfers, and locomotion level  PARTICIPATION LIMITATIONS: meal prep, cleaning, laundry,  driving, shopping, community activity, occupation, and yard work  PERSONAL FACTORS: Past/current experiences are also affecting patient's functional outcome.   REHAB POTENTIAL: Good  CLINICAL DECISION MAKING: Evolving/moderate complexity  EVALUATION COMPLEXITY: Low   GOALS: Goals reviewed with patient? Yes  SHORT TERM GOALS: Target date: 04/22/23  I with initial HEP Baseline: Goal status: INITIAL  LONG TERM GOALS: Target date: 06/30/23  I with final HEP Baseline:  Goal status: INITIAL  2.  Patient will be able to walk > 1 mile with no increase in pain or weakness noted in BLE Baseline: < 10 minutes Goal status: INITIAL  3.  Patient will be able to carry at least 30# while maintaining upright, stable posture, with no pain. Baseline:  Goal status: INITIAL  4.  Patient will demonstrate improved functional LE strength and endurance by maintaining a 1/4-1/3 wall squat for > 1  minute. Baseline:  Goal status: INITIAL  5.  Up and down at least 12 steps using step over step, no UE support Baseline:  Goal status: INITIAL PLAN:  PT FREQUENCY: 1x/week  PT DURATION: 12 weeks  PLANNED INTERVENTIONS: 97110-Therapeutic exercises, 97530- Therapeutic activity, 97112- Neuromuscular re-education, 97535- Self Care, 29518- Manual therapy, [phone]- Gait training, Patient/Family education, Balance training, Stair training, Taping, Joint mobilization, Cryotherapy, and Moist heat.  PLAN FOR NEXT SESSION: Update HEP as appropriate with functional strengthening activities, mildly challenge balance and proprioception.   Iona Beard, DPT 04/16/2023, 8:46 AM

## 2023-04-23 ENCOUNTER — Ambulatory Visit: Payer: Medicare Other | Admitting: Physical Therapy

## 2023-04-24 ENCOUNTER — Encounter: Payer: Self-pay | Admitting: Pharmacist Clinician (PhC)/ Clinical Pharmacy Specialist

## 2023-04-26 ENCOUNTER — Encounter: Payer: Self-pay | Admitting: Cardiovascular Disease

## 2023-04-27 MED ORDER — AMLODIPINE BESYLATE 5 MG PO TABS: 5.0000 mg | ORAL_TABLET | Freq: Every day | ORAL | 3 refills | Status: DC

## 2023-04-27 NOTE — Telephone Encounter (Signed)
Please see 12/20 my chart message for documentation

## 2023-04-28 ENCOUNTER — Ambulatory Visit: Payer: Medicare Other | Admitting: Physical Therapy

## 2023-05-04 ENCOUNTER — Ambulatory Visit: Payer: Medicare Other | Admitting: Physical Therapy

## 2023-05-06 DIAGNOSIS — E119 Type 2 diabetes mellitus without complications: Secondary | ICD-10-CM | POA: Diagnosis not present

## 2023-05-07 DIAGNOSIS — M5416 Radiculopathy, lumbar region: Secondary | ICD-10-CM | POA: Diagnosis not present

## 2023-05-25 ENCOUNTER — Ambulatory Visit: Payer: Medicare Other

## 2023-05-31 ENCOUNTER — Other Ambulatory Visit: Payer: Self-pay | Admitting: Cardiovascular Disease

## 2023-06-01 ENCOUNTER — Other Ambulatory Visit: Payer: Self-pay | Admitting: Cardiovascular Disease

## 2023-06-01 ENCOUNTER — Encounter: Payer: Self-pay | Admitting: Family Medicine

## 2023-06-03 ENCOUNTER — Encounter: Payer: Self-pay | Admitting: Family Medicine

## 2023-06-06 DIAGNOSIS — E119 Type 2 diabetes mellitus without complications: Secondary | ICD-10-CM | POA: Diagnosis not present

## 2023-06-30 ENCOUNTER — Other Ambulatory Visit (HOSPITAL_COMMUNITY): Payer: Self-pay

## 2023-06-30 ENCOUNTER — Encounter: Payer: Self-pay | Admitting: Pharmacist

## 2023-06-30 ENCOUNTER — Ambulatory Visit: Payer: Medicare Other | Attending: Cardiology | Admitting: Pharmacist

## 2023-06-30 VITALS — BP 154/64 | HR 56

## 2023-06-30 DIAGNOSIS — I10 Essential (primary) hypertension: Secondary | ICD-10-CM | POA: Diagnosis not present

## 2023-06-30 MED ORDER — OMRON 3 SERIES BP MONITOR DEVI
0 refills | Status: AC
  Filled 2023-06-30: qty 1, 30d supply, fill #0

## 2023-06-30 MED ORDER — CHLORTHALIDONE 25 MG PO TABS: 25.0000 mg | ORAL_TABLET | Freq: Every day | ORAL | 1 refills | Status: DC

## 2023-06-30 NOTE — Patient Instructions (Addendum)
 It was ncie meeting you today  Your blood pressure is improved but still above goal of <130/80  We will increase your chlorthalidone to 25mg  once a day in the morning  Please continue amlodipine 5mg  and olmesartan 40mg  daily  We will see you back in a month  Laural Golden, PharmD, BCACP, CDCES, CPP [address] [place], [place], [address] Phone: [phone], Fax: [phone]

## 2023-06-30 NOTE — Progress Notes (Signed)
 Patient ID: REN ASPINALL                 DOB: [birth year]                      MRN: [id number]     HPI: Dakota Gibbs is a 73 y.o. male referred by Dr. Dr Tresa Endo to HTN clinic. PMH is significant for CAD, brady cardia, HTN, and T2DM. Chlorthalidone added at last visit.  Patient presents today in good spirits. Has difficulty splitting chlorthalidone tablets in half. Is not sure he is getting full dose. Otherwise, no adverse effects reported.  Brought two home wrist cuffs that he uses. Had patient check in room. First cuff had dead batteries. Second cuff gave 2 very different readings: 95/56 and then 163/83 immediately after.  Memory on working cuff shows large discrepancy in systolic readings ranging from 110-160.  Current HTN meds:  Amlodipine 5mg  daily in evening Olmesartan 40mg  daily in evening Chlorthalidone 12.5mg  daily morning  BP goal: <130/80   Wt Readings from Last 3 Encounters:  03/18/23 200 lb (90.7 kg)  03/17/23 201 lb 9.6 oz (91.4 kg)  03/16/23 202 lb 9.6 oz (91.9 kg)   BP Readings from Last 3 Encounters:  06/30/23 (!) 154/64  03/19/23 (!) 153/59  03/17/23 116/68   Pulse Readings from Last 3 Encounters:  06/30/23 (!) 56  03/19/23 66  03/17/23 61    Renal function: CrCl cannot be calculated (Patient's most recent lab result is older than the maximum 21 days allowed.).  Past Medical History:  Diagnosis Date   Allergy    enviornmental   Anxiety    Anxiety and depression 10/25/2012   Arthritis    self dx   Back pain    L4-L5 bulging disc, L5-S1 - bulging disc, pinched nerve in neck   Cataract    bilateral sx   Coronary artery disease    hx of balloon angioplasty with DR Bishop Limbo   Depression    Diabetes mellitus    type II   Dyspnea    History of shingles 2009   Hyperlipidemia    on meds   Hypertension    on meds   Myocardial infarction [facility]) 1997   caused by a blood clot   Neuropathy    Sleep apnea    cpap    Current Outpatient  Medications on File Prior to Visit  Medication Sig Dispense Refill   docusate sodium (COLACE) 100 MG capsule Take 100 mg by mouth 2 (two) times daily.     naloxone (NARCAN) nasal spray 4 mg/0.1 mL Place 1 spray into the nose once.     amLODipine (NORVASC) 5 MG tablet Take 1 tablet (5 mg total) by mouth daily. 90 tablet 3   aspirin EC 81 MG tablet Take 81 mg by mouth daily. Swallow whole.     atorvastatin (LIPITOR) 20 MG tablet Take 1 tablet (20 mg total) by mouth daily. 90 tablet 3   cetirizine (ZYRTEC) 10 MG tablet Take 10 mg by mouth daily.     citalopram (CELEXA) 20 MG tablet TAKE ONE AND ONE-HALF TABLETS BY MOUTH DAILY 135 tablet 1   ezetimibe (ZETIA) 10 MG tablet Take 1 tablet (10 mg total) by mouth at bedtime. MUST MAKE FOLLOW UP APPOINTMENT FOR FUTURE REFILLS 90 tablet 0   HYDROcodone-acetaminophen (NORCO/VICODIN) 5-325 MG tablet Take 1 tablet by mouth every 4 (four) hours as needed for moderate pain (pain score 4-6). 30  tablet 0   metFORMIN (GLUCOPHAGE-XR) 500 MG 24 hr tablet TAKE ONE TABLET BY MOUTH EVERY DAY WITH BREAKFAST 90 tablet 1   methocarbamol (ROBAXIN) 500 MG tablet Take 1 tablet (500 mg total) by mouth every 6 (six) hours as needed for muscle spasms. 45 tablet 0   Multiple Vitamin (MULTIVITAMIN) tablet Take 1 tablet by mouth daily.     nitroGLYCERIN (NITROSTAT) 0.4 MG SL tablet For chest pain, tightness, or pressure. While sitting, place 1 tablet under tongue. May be used every 5 minutes as needed, for up to 15 minutes. Do not use more than 3 tablets. 25 tablet 6   olmesartan (BENICAR) 40 MG tablet Take 1 tablet (40 mg total) by mouth at bedtime. 90 tablet 1   ondansetron (ZOFRAN) 4 MG tablet Take 1 tablet (4 mg total) by mouth every 8 (eight) hours as needed for nausea or vomiting. 20 tablet 0   oxymetazoline (AFRIN) 0.05 % nasal spray Place 1 spray into both nostrils 2 (two) times daily.     pregabalin (LYRICA) 50 MG capsule Take 1 capsule (50 mg total) by mouth daily. (Patient  taking differently: Take 50 mg by mouth 2 (two) times daily.) 30 capsule 3   pyridOXINE (VITAMIN B6) 100 MG tablet Take 100 mg by mouth daily.     trolamine salicylate (ASPERCREME) 10 % cream Apply 1 Application topically as needed for muscle pain.     vitamin C (ASCORBIC ACID) 500 MG tablet Take 500 mg by mouth daily.     vitamin E 180 MG (400 UNITS) capsule Take 400 Units by mouth daily.     No current facility-administered medications on file prior to visit.    Allergies  Allergen Reactions   Hydrocodone Shortness Of Breath, Nausea And Vomiting, Nausea Only, Anxiety and Other (See Comments)    Will take with Anti-Nausea medication   Oxycodone Diarrhea and Nausea Only     Assessment/Plan:  1. Hypertension -  HYPERTENSION CONTROL Vitals:   06/30/23 0933 06/30/23 0936  BP: 139/63 (!) 154/64    The patient's blood pressure is elevated above target today.  In order to address the patient's elevated BP: A current anti-hypertensive medication was adjusted today.    Patient BP in room today 139/63 which is improved but still above goal of <130/80. Will increase chlorthalidone to 25mg  today.  Recommended patient pick up upper arm cuff. Recommended Omron BP cuff. Rx sent to [facility] pharmacy. Recheck in 4 weeks.  Continue olmesartan 40mg  daily Continue amlodipine 5mg  daily Increase chlorthalidone to 25mg  daily Recheck in 4 weeks  Laural Golden, PharmD, BCACP, CDCES, CPP [address], [address] [place], [place], [address] Phone: [phone], Fax: [phone]

## 2023-07-04 DIAGNOSIS — E119 Type 2 diabetes mellitus without complications: Secondary | ICD-10-CM | POA: Diagnosis not present

## 2023-07-05 ENCOUNTER — Other Ambulatory Visit: Payer: Self-pay | Admitting: Cardiovascular Disease

## 2023-07-05 DIAGNOSIS — I10 Essential (primary) hypertension: Secondary | ICD-10-CM

## 2023-07-14 ENCOUNTER — Encounter: Payer: Self-pay | Admitting: Family Medicine

## 2023-07-17 DIAGNOSIS — M25561 Pain in right knee: Secondary | ICD-10-CM | POA: Diagnosis not present

## 2023-07-31 DIAGNOSIS — H52223 Regular astigmatism, bilateral: Secondary | ICD-10-CM | POA: Diagnosis not present

## 2023-08-04 DIAGNOSIS — E119 Type 2 diabetes mellitus without complications: Secondary | ICD-10-CM | POA: Diagnosis not present

## 2023-08-06 DIAGNOSIS — Z6830 Body mass index (BMI) 30.0-30.9, adult: Secondary | ICD-10-CM | POA: Diagnosis not present

## 2023-08-06 DIAGNOSIS — M5416 Radiculopathy, lumbar region: Secondary | ICD-10-CM | POA: Diagnosis not present

## 2023-08-10 ENCOUNTER — Ambulatory Visit: Payer: Medicare Other

## 2023-08-20 DIAGNOSIS — M47816 Spondylosis without myelopathy or radiculopathy, lumbar region: Secondary | ICD-10-CM | POA: Diagnosis not present

## 2023-08-30 DIAGNOSIS — M5416 Radiculopathy, lumbar region: Secondary | ICD-10-CM | POA: Diagnosis not present

## 2023-09-03 DIAGNOSIS — E119 Type 2 diabetes mellitus without complications: Secondary | ICD-10-CM | POA: Diagnosis not present

## 2023-09-08 ENCOUNTER — Other Ambulatory Visit: Payer: Self-pay | Admitting: Family Medicine

## 2023-09-08 DIAGNOSIS — R7303 Prediabetes: Secondary | ICD-10-CM

## 2023-09-08 DIAGNOSIS — M5416 Radiculopathy, lumbar region: Secondary | ICD-10-CM | POA: Diagnosis not present

## 2023-09-09 ENCOUNTER — Ambulatory Visit: Admitting: Pharmacist Clinician (PhC)/ Clinical Pharmacy Specialist

## 2023-09-14 ENCOUNTER — Encounter: Payer: Self-pay | Admitting: Family Medicine

## 2023-09-14 ENCOUNTER — Ambulatory Visit (INDEPENDENT_AMBULATORY_CARE_PROVIDER_SITE_OTHER): Payer: Medicare Other | Admitting: Family Medicine

## 2023-09-14 VITALS — BP 108/62 | HR 57 | Temp 97.3°F | Ht 70.0 in | Wt 210.6 lb

## 2023-09-14 DIAGNOSIS — M722 Plantar fascial fibromatosis: Secondary | ICD-10-CM | POA: Insufficient documentation

## 2023-09-14 DIAGNOSIS — R7303 Prediabetes: Secondary | ICD-10-CM | POA: Diagnosis not present

## 2023-09-14 DIAGNOSIS — I10 Essential (primary) hypertension: Secondary | ICD-10-CM | POA: Diagnosis not present

## 2023-09-14 DIAGNOSIS — Z125 Encounter for screening for malignant neoplasm of prostate: Secondary | ICD-10-CM | POA: Diagnosis not present

## 2023-09-14 DIAGNOSIS — E785 Hyperlipidemia, unspecified: Secondary | ICD-10-CM

## 2023-09-14 DIAGNOSIS — D649 Anemia, unspecified: Secondary | ICD-10-CM

## 2023-09-14 LAB — PSA: PSA: 1.23 ng/mL (ref 0.10–4.00)

## 2023-09-14 LAB — CBC WITH DIFFERENTIAL/PLATELET
Basophils Absolute: 0 10*3/uL (ref 0.0–0.1)
Basophils Relative: 0.4 % (ref 0.0–3.0)
Eosinophils Absolute: 0.5 10*3/uL (ref 0.0–0.7)
Eosinophils Relative: 4.6 % (ref 0.0–5.0)
HCT: 42.4 % (ref 39.0–52.0)
Hemoglobin: 14.1 g/dL (ref 13.0–17.0)
Lymphocytes Relative: 21 % (ref 12.0–46.0)
Lymphs Abs: 2.2 10*3/uL (ref 0.7–4.0)
MCHC: 33.3 g/dL (ref 30.0–36.0)
MCV: 95.4 fl (ref 78.0–100.0)
Monocytes Absolute: 0.7 10*3/uL (ref 0.1–1.0)
Monocytes Relative: 6.9 % (ref 3.0–12.0)
Neutro Abs: 7.1 10*3/uL (ref 1.4–7.7)
Neutrophils Relative %: 67.1 % (ref 43.0–77.0)
Platelets: 248 10*3/uL (ref 150.0–400.0)
RBC: 4.44 Mil/uL (ref 4.22–5.81)
RDW: 13.3 % (ref 11.5–15.5)
WBC: 10.6 10*3/uL — ABNORMAL HIGH (ref 4.0–10.5)

## 2023-09-14 LAB — COMPREHENSIVE METABOLIC PANEL WITH GFR
ALT: 17 U/L (ref 0–53)
AST: 15 U/L (ref 0–37)
Albumin: 4.2 g/dL (ref 3.5–5.2)
Alkaline Phosphatase: 74 U/L (ref 39–117)
BUN: 27 mg/dL — ABNORMAL HIGH (ref 6–23)
CO2: 30 meq/L (ref 19–32)
Calcium: 9.3 mg/dL (ref 8.4–10.5)
Chloride: 99 meq/L (ref 96–112)
Creatinine, Ser: 1 mg/dL (ref 0.40–1.50)
GFR: 73.97 mL/min (ref >60.00)
Glucose, Bld: 119 mg/dL — ABNORMAL HIGH (ref 70–99)
Potassium: 4.5 meq/L (ref 3.5–5.1)
Sodium: 136 meq/L (ref 135–145)
Total Bilirubin: 0.4 mg/dL (ref 0.2–1.2)
Total Protein: 6.9 g/dL (ref 6.0–8.3)

## 2023-09-14 LAB — MICROALBUMIN / CREATININE URINE RATIO
Creatinine,U: 67 mg/dL
Microalb Creat Ratio: UNDETERMINED mg/g (ref 0.0–30.0)
Microalb, Ur: <0.7 mg/dL

## 2023-09-14 LAB — LIPID PANEL
Cholesterol: 143 mg/dL (ref 0–200)
HDL: 60.5 mg/dL (ref >39.00)
LDL Cholesterol: 52 mg/dL (ref 0–99)
NonHDL: 82.99
Total CHOL/HDL Ratio: 2
Triglycerides: 156 mg/dL — ABNORMAL HIGH (ref 0.0–149.0)
VLDL: 31.2 mg/dL (ref 0.0–40.0)

## 2023-09-14 LAB — HEMOGLOBIN A1C: Hgb A1c MFr Bld: 6.5 % (ref 4.6–6.5)

## 2023-09-14 LAB — VITAMIN B12: Vitamin B-12: 619 pg/mL (ref 211–911)

## 2023-09-14 MED ORDER — METFORMIN HCL ER 500 MG PO TB24: 500.0000 mg | ORAL_TABLET | Freq: Two times a day (BID) | ORAL | 1 refills | Status: DC

## 2023-09-14 NOTE — Progress Notes (Addendum)
 Established Patient Office Visit   Subjective:  Patient ID: Dakota Gibbs, male    DOB: [birth year]  Age: 73 y.o. MRN: [id number]  Chief Complaint  Patient presents with   Medical Management of Chronic Issues    Follow up. Pt is fasting.    Foot Pain    Left heel pain.     Foot Pain Pertinent negatives include no abdominal pain, myalgias, rash or weakness.   Encounter Diagnoses  Name Primary?   Prediabetes Yes   Hyperlipidemia with target LDL less than 70    Essential hypertension    Screening for prostate cancer    Normocytic anemia    Plantar fasciitis    For follow-up of above.  Continues medications as listed below.  2-week history of left heel pain.  No injury.  Difficulty for him to exercise at this time secondary to ongoing lower back pain.   Review of Systems  Constitutional: Negative.   HENT: Negative.    Eyes:  Negative for blurred vision, discharge and redness.  Respiratory: Negative.    Cardiovascular: Negative.   Gastrointestinal:  Negative for abdominal pain.  Genitourinary: Negative.   Musculoskeletal: Negative.  Negative for myalgias.  Skin:  Negative for rash.  Neurological:  Negative for tingling, loss of consciousness and weakness.  Endo/Heme/Allergies:  Negative for polydipsia.     Current Outpatient Medications:    amLODipine  (NORVASC ) 5 MG tablet, Take 1 tablet (5 mg total) by mouth daily., Disp: 90 tablet, Rfl: 3   aspirin  EC 81 MG tablet, Take 81 mg by mouth daily. Swallow whole., Disp: , Rfl:    atorvastatin  (LIPITOR) 20 MG tablet, Take 1 tablet (20 mg total) by mouth daily., Disp: 90 tablet, Rfl: 3   Blood Pressure Monitoring (OMRON 3 SERIES BP MONITOR) DEVI, Use to check blood pressure as needed, Disp: 1 each, Rfl: 0   cetirizine (ZYRTEC) 10 MG tablet, Take 10 mg by mouth daily., Disp: , Rfl:    chlorthalidone  (HYGROTON ) 25 MG tablet, Take 1 tablet (25 mg total) by mouth daily., Disp: 90 tablet, Rfl: 1   citalopram  (CELEXA ) 20 MG  tablet, TAKE ONE AND ONE-HALF TABLETS BY MOUTH DAILY, Disp: 135 tablet, Rfl: 1   ezetimibe  (ZETIA ) 10 MG tablet, Take 1 tablet (10 mg total) by mouth at bedtime. MUST MAKE FOLLOW UP APPOINTMENT FOR FUTURE REFILLS, Disp: 90 tablet, Rfl: 0   metFORMIN  (GLUCOPHAGE -XR) 500 MG 24 hr tablet, TAKE 1 TABLET BY MOUTH EVERY DAY WITH BREAKFAST, Disp: 90 tablet, Rfl: 1   Multiple Vitamin (MULTIVITAMIN) tablet, Take 1 tablet by mouth daily., Disp: , Rfl:    nitroGLYCERIN  (NITROSTAT ) 0.4 MG SL tablet, For chest pain, tightness, or pressure. While sitting, place 1 tablet under tongue. May be used every 5 minutes as needed, for up to 15 minutes. Do not use more than 3 tablets., Disp: 25 tablet, Rfl: 6   olmesartan  (BENICAR ) 40 MG tablet, TAKE 1 TABLET BY MOUTH AT BEDTIME, Disp: 90 tablet, Rfl: 3   oxymetazoline  (AFRIN) 0.05 % nasal spray, Place 1 spray into both nostrils 2 (two) times daily., Disp: , Rfl:    pregabalin  (LYRICA ) 50 MG capsule, Take 1 capsule (50 mg total) by mouth daily. (Patient taking differently: Take 50 mg by mouth 2 (two) times daily.), Disp: 30 capsule, Rfl: 3   pyridOXINE (VITAMIN B6) 100 MG tablet, Take 100 mg by mouth daily., Disp: , Rfl:    trolamine salicylate (ASPERCREME) 10 % cream, Apply 1 Application topically as  needed for muscle pain., Disp: , Rfl:    vitamin C  (ASCORBIC ACID ) 500 MG tablet, Take 500 mg by mouth daily., Disp: , Rfl:    vitamin E  180 MG (400 UNITS) capsule, Take 400 Units by mouth daily., Disp: , Rfl:    Objective:     BP 108/62 (Cuff Size: Large)   Pulse (!) 57   Temp (!) 97.3 F (36.3 C) (Temporal)   Ht 5\' 10"  (1.778 m)   Wt 210 lb 9.6 oz (95.5 kg)   SpO2 95%   BMI 30.22 kg/m  Wt Readings from Last 3 Encounters:  09/14/23 210 lb 9.6 oz (95.5 kg)  03/18/23 200 lb (90.7 kg)  03/17/23 201 lb 9.6 oz (91.4 kg)      Physical Exam Constitutional:      General: He is not in acute distress.    Appearance: Normal appearance. He is not ill-appearing,  toxic-appearing or diaphoretic.  HENT:     Head: Normocephalic and atraumatic.     Right Ear: External ear normal.     Left Ear: External ear normal.     Mouth/Throat:     Mouth: Mucous membranes are moist.     Pharynx: Oropharynx is clear. No oropharyngeal exudate or posterior oropharyngeal erythema.  Eyes:     General: No scleral icterus.       Right eye: No discharge.        Left eye: No discharge.     Extraocular Movements: Extraocular movements intact.     Conjunctiva/sclera: Conjunctivae normal.     Pupils: Pupils are equal, round, and reactive to light.  Cardiovascular:     Rate and Rhythm: Normal rate and regular rhythm.  Pulmonary:     Effort: Pulmonary effort is normal. No respiratory distress.     Breath sounds: Normal breath sounds. No wheezing, rhonchi or rales.  Abdominal:     General: Bowel sounds are normal.  Musculoskeletal:     Cervical back: No rigidity or tenderness.     Left foot: Normal range of motion and normal capillary refill. Tenderness and bony tenderness present.     Comments: Tenderness to palpation of calcaneus at sole of foot.  Lymphadenopathy:     Cervical: No cervical adenopathy.  Skin:    General: Skin is warm and dry.  Neurological:     Mental Status: He is alert and oriented to person, place, and time.  Psychiatric:        Mood and Affect: Mood normal.        Behavior: Behavior normal.      No results found for any visits on 09/14/23.    The ASCVD Risk score (Arnett DK, et al., 2019) failed to calculate for the following reasons:   The valid total cholesterol range is 130 to 320 mg/dL    Assessment & Plan:   Prediabetes -     Comprehensive metabolic panel with GFR -     Hemoglobin A1c -     Microalbumin / creatinine urine ratio  Hyperlipidemia with target LDL less than 70 -     Comprehensive metabolic panel with GFR -     Lipid panel  Essential hypertension -     Comprehensive metabolic panel with GFR  Screening for  prostate cancer -     PSA  Normocytic anemia -     Iron, TIBC and Ferritin Panel -     CBC with Differential/Platelet -     Vitamin B12  Plantar fasciitis  Return in about 6 months (around 03/16/2024).  Exercises given for plantar fasciitis.  Advised that he obtain footwear with good arch support heel and toe cushioning.  He has follow-up scheduled with his orthopedist in a few weeks.  Further recommendations made pending results of labs today.  Continue all medications as above.  Encouraged weight loss.  Tonna Frederic, MD  5/12 addendum: A1c has increased.  Have increased metformin  to twice daily.

## 2023-09-14 NOTE — Addendum Note (Signed)
 Addended by: Delene Feinstein on: 09/14/2023 04:51 PM   Modules accepted: Orders

## 2023-09-15 ENCOUNTER — Ambulatory Visit: Payer: Self-pay | Admitting: Family Medicine

## 2023-09-15 LAB — IRON,TIBC AND FERRITIN PANEL
%SAT: 26 % (ref 20–48)
Ferritin: 154 ng/mL (ref 24–380)
Iron: 81 ug/dL (ref 50–180)
TIBC: 315 ug/dL (ref 250–425)

## 2023-09-17 ENCOUNTER — Ambulatory Visit: Payer: Medicare Other | Attending: Cardiovascular Disease | Admitting: Cardiovascular Disease

## 2023-09-17 ENCOUNTER — Encounter: Payer: Self-pay | Admitting: Cardiovascular Disease

## 2023-09-17 DIAGNOSIS — I2111 ST elevation (STEMI) myocardial infarction involving right coronary artery: Secondary | ICD-10-CM | POA: Diagnosis not present

## 2023-09-17 DIAGNOSIS — M549 Dorsalgia, unspecified: Secondary | ICD-10-CM

## 2023-09-17 DIAGNOSIS — G4733 Obstructive sleep apnea (adult) (pediatric): Secondary | ICD-10-CM

## 2023-09-17 DIAGNOSIS — I10 Essential (primary) hypertension: Secondary | ICD-10-CM | POA: Diagnosis not present

## 2023-09-17 DIAGNOSIS — E785 Hyperlipidemia, unspecified: Secondary | ICD-10-CM

## 2023-09-17 DIAGNOSIS — I2510 Atherosclerotic heart disease of native coronary artery without angina pectoris: Secondary | ICD-10-CM | POA: Diagnosis not present

## 2023-09-17 NOTE — Patient Instructions (Signed)
 Medication Instructions:  No changes *If you need a refill on your cardiac medications before your next appointment, please call your pharmacy*  Lab Work: No labs  Testing/Procedures: No testing  Follow-Up: At [facility], you and your health needs are our priority.  As part of our continuing mission to provide you with exceptional heart care, our providers are all part of one team.  This team includes your primary Cardiologist (physician) and Advanced Practice Providers or APPs (Physician Assistants and Nurse Practitioners) who all work together to provide you with the care you need, when you need it.  Your next appointment:   9 month(s)  Provider:   Carson Clara MD  We recommend signing up for the patient portal called "MyChart".  Sign up information is provided on this After Visit Summary.  MyChart is used to connect with patients for Virtual Visits (Telemedicine).  Patients are able to view lab/test results, encounter notes, upcoming appointments, etc.  Non-urgent messages can be sent to your provider as well.   To learn more about what you can do with MyChart, go to [email or web address].

## 2023-09-17 NOTE — Progress Notes (Signed)
 Hand   patient ID: Dakota Gibbs, male   DOB: 11/17/1950, 73 y.o.   MRN: [id number]     PCP: Dr. Devonna Foley  HPI: Dakota Gibbs is a 73 y.o. male who presents to the office today for an 8 month cardiology and sleep evaluation.  I first saw Dakota Gibbs on May 10, 1995 when he suffered an inferior wall myocardial infarction and I performed acute intervention to a totally occluded RCA. He had more distal disease in the posterolateral vessel which was treated medically. Several days later he underwent staged intervention to circumflex marginal vessel. His last cardiac catheterization was in 2001 which did not show restenosis and actually showed coronary artery disease regression.  He has been aggressively treated since his initial event.  When I saw him several  years ago he had noticed several episodes in the early morning while sleeping that he develops nocturnal palpitations. Upon further questioning he does snore and his snoring is more significantly abnormal particularly after drinking alcohol. He wakes up one to 2 times per night.  He was started on Celexa  for anxiety/depression which has helped.  He has a history of hyperlipidemia and has been tolerating Lipitor. He also has GERD, and hypertension.   Over the past year, he admits to a 25 pound weight loss. In contrast to his previous diet many years ago when he tried the [employer], his current diet is eating less along with few carbohydrates. His weight is reduced from 208-180 3 pounds. He is sleeping significantly improved. He is not aware of any snoring.  He continues to take amlodipine  5 mg, lisinopril  20, no grams, Toprol -XL 50 mg for hypertension and CAD.  He is on Zantac 150 mg for dyspepsia/GERD. He has more energy.  Lab work done by his primary physician had shown improvement in his hemoglobin A1c at 6.0, improved from 6.6.   I saw him in March 2018.  At that time he continues to do well an was working in the executive  recruiting business.  He denied chest pain or shortness of breath.  At times he believes he may be having some short-term memory loss.    I saw him in the clinic on a DOD on Sep 06, 2018.  However, with the COVID-19 pandemic, he had not been going to the gym.  Over the past month, he has began to notice episodes of transient lightheadedness and dizziness which typically occur during physical exertion.  He denies any frank syncope.  He denies exertional chest pain or palpitations.  He has been noticing that his blood pressure has been in the 140 to 150 range.  He has continued to be on Toprol -XL 50 mg, amlodipine  5 mg, and lisinopril  20 mg.  He is on atorvastatin  for hyperlipidemia.  He continues to be on Celexa .  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    During that evaluation, his ECG revealed sinus bradycardia at 43 bpm.  Laboratory also had shown elevated potassium at 5.6.  I decreased lisinopril  from 20 mg down to 10 mg and titrated amlodipine  to 10 mg.  I provided him information regarding potassium and certain foods and fruits.  An echo Doppler study as well as carotid duplex imaging were recommended.   Repeat laboratory several days later showed a potassium of 4.9.  Patient did realize that he was having a fair  amount of grapefruit juice and a Seabreeze drink which also contributed to his potassium elevation.  His echo Doppler study showed an EF of 60 to 65% with grade 1 diastolic dysfunction.  There was minimal increased RV systolic pressure at 32.6 mm.  There was mild right atrial dilatation.  His carotid study showed mild bilateral internal carotid plaque in the 1 to 39% range with greater than 50% ECA stenoses.  He had normal antegrade flow bilaterally in the vertebral arteries and his subclavian arteries were normal.   Since I  saw him, he has monitored his blood pressure and has  average 40 readings.  He states his blood pressure has run over 40 samples averaging 142/59.  His pulse is increased slightly now in the 50s with an average of 53.  He had undergone repeat laboratory which had shown his LDL cholesterol had risen to 85.  He denies any episodes of chest pain or shortness of breath.  However, he still notes occasional episodes of transient dizziness . He presents for evaluation  He was evaluated in a telemedicine visit on Sep 20, 2018.  At that time his blood pressure had improved.  He was on amlodipine  10 mg, reduced dose of  lisinopril  at 10 mg, and Toprol -XL 25 mg.  He continued to have some episodes of dizziness and bradycardia and during that evaluation recommended reduction of Toprol -XL to 12.5 mg daily.  I reviewed his echo Doppler data as well as his carotid duplex imaging studies.  He had normal bilateral antegrade flow to his vertebrals and he did not have any high-grade carotid stenoses.  With his LDL cholesterol of 85 on atorvastatin  40 mg I added Zetia  10 mg.  Repeat potassium improved to 4.9 with discontinuance of his frequent grapefruit juice.  I recommended that he undergo an event monitor for further evaluation of potential arrhythmia.  He was monitored from June 30 through November 15, 2018.  The predominant rhythm was sinus rhythm with the fastest episode of sinus tachycardia at 110 bpm at 8:28 PM on July 4.  His slowest episode was sinus bradycardia at 46 bpm while sleeping at 3:50 AM on July 8.  There were occasional PVCs representing 1% of his abnormal rhythm.  There was 1 episode of 4 beats of multifocal ventricular tachycardia on July 3.  Otherwise PVCs were isolated.  There were no episodes of atrial fibrillation.  There were no pauses.   I saw him in August 2020 at which time his syncope had improved.  Presyncope has improved.  However he does note that he still feels episodes of some lightheadedness.  Today he planted for plants in the heat.  He also cuts  rubs.  He denied any definitive orthostatic symptoms.  He walked a mile today without chest pain or dizziness.    He was last evaluated in a telemedicine visit on March 03, 2019.  At that time his symptoms had dramatically improved.  He was no longer having significant shortness of breath with walking or chest pressure.  He still noted mild fatigability.  He was unaware of palpitations.  When I saw him in May 2021 his major complaint was that he was tired all the time.  He notes shortness of breath and fatigue particularly while walking in his garden.  He still able to walk a mile 3 to 4 days/week.  He is back at work in his executive recruiting business.  He does note some early morning palpitations typically around 5 AM.  Recently his blood pressure has been elevated and may reach 160 systolically.  I recommended dose titration of lisinopril  from 10 mg up to 20 mg I also recommended he undergo a sleep study for evaluation of sleep apnea.  On October 17, 2019 he underwent his diagnostic polysomnogram.  This revealed severe obstructive sleep apnea with an AHI of 39.2, RDI of 57.4, NREM AHI of 30/h.  Events were worse with supine sleep with an AHI of 58.1.  Oxygen desaturated to a nadir of 85%.  CPAP titration was recommended.   Dakota Gibbs developed Covid in late July and was not hospitalized but felt markedly fatigued for 3 weeks.  He never underwent a CPAP titration trial and wanted to discuss this further with me today.  He is unaware of any palpitations or orthostatic symptoms.    When I saw him in August 2021 strongly recommended he pursue the CPAP titration study.  I also recommended he undergo a follow-up echo Doppler study following his Covid infection to make certain there was no residual myocardial effects.  Following his Covid exposure he underwent a booster vaccination.  He was maintaining sinus rhythm and his blood pressure was stable.  His echo Doppler study was done on 01/10/2020 which  showed an EF of 55 to 60% with grade 1 diastolic dysfunction.  There is mild biatrial enlargement.  There was mild dilation of his ascending aorta millimeters.  I saw him in January 2022.  He had undergone a CPAP titration on 02/09/2020 and was titrated up to 10 cm water  pressure with AHI of 0 and O2 nadir at 92.  Dakota Gibbs's CPAP set up date was 04/12/2020.  Since initiating therapy he has used CPAP with 100% compliance.  A download was obtained from his initiation until presently which is approximately 3 weeks.  Average usage was 9 hours and 38 minutes.  At a 10 cm set pressure, AHI was 7.2 he did have central events at 2.5/h.  He believes he is sleeping much better since initiating CPAP therapy.  He typically goes to bed at 9 PM and wakes up sometime around 8 AM.  With CPAP therapy, he felt significantly improved.  I He denied any residual daytime sleepiness.  An Epworth Sleepiness Scale score was calculated in the office and this endorsed at 4. I  made adjustments to his prescription and changed him from a set pressure to an auto pressure with a range of 10 to 15 minutes.  I also decreased his ramp time down to 5 minutes.    I saw him on November 10, 2020. Approximately 1 month previously he had a presyncopal spell and this occurred on a day that was extremely hot, he was somewhat dehydrated, and had been working significantly in his large yard.  His blood pressure dropped to 95/39 and he believes he may have passed out briefly.  He denied any associated tachycardia dysrhythmias.  He is sleeping much better with CPAP.  A new download was obtained from June 16 through November 18, 2020.  Usage was excellent with essentially 100% compliance and average use of 10 hours per night.  His AutoSet CPAP air sense 10 unit is set at a pressure of 10 to 15 cm of water .  AHI is 6.2.  95th percentile pressure is 12.7 with maximum average pressure 13.9.  He feels well.  He denies swelling and has been taking HCTZ 12.5 mg twice a  day in addition to olmesartan  20 mg, amlodipine  10 mg  and metoprolol  succinate 12.5 mg daily.  With his recent dehydration, I suggested he reduce his hydrochlorothiazide .  He was hospitalized in April 2024 2 days after undergoing left total knee replacement with complaints of shortness of breath and generalized weakness and oxygen desaturation.  An echo Doppler study on August 16, 2022 showed hyperdynamic LV function with EF 70 to 75%, grade 1 diastolic dysfunction, moderate right atrial and severe left atrial dilatation as well as mild dilation of aortic borderline at 39 mm.  Showed he was started on Lasix  with excellent diuresis.  He wore a 14-day Zio patch which showed minimum heart rate 42, maximum heart rate 164 and he had 9 brief runs of SVT with the fastest at 8 beats with a maximum rate of 164 the longest 11.8 seconds averaging 113 bpm.  Subsequently, he was evaluated by Morey Ar on November 12, 2022.  I last saw him on February 02, 2019 for at which time he had noticed recent blood pressure elevations  He denied any anginal symptoms.  He has issues with neuropathy and a pinched nerve at L5.  He has been on olmesartan  40 mg, atorvastatin  40 mg in addition to Zetia  10 mg.  He has been taking Celexa .  He takes a baby aspirin .  Laboratory on June 19 showed total cholesterol 95 HDL 42 LDL 25 triglycerides 139.  He continues to use CPAP and is sleeping well.  A download from September 1 through February 02, 2023 shows excellent compliance with average use of 10 hours and 21 minutes.  His CPAP is set at a range of 9 to 15 cm of water .  AHI is increased to 8.0 with 95th percentile pressure 12.2 with maximum average pressure 13.2.  He feels he is sleeping well.   During that evaluation, I added amlodipine  5 mg to his medical regimen for improved blood pressure control.  He had neuropathy with a pinched nerve at L5.  He was unaware of any episodes of palpitations.  He has subsequently been seen by  Aldona Amel, RPH-CPP on March 09, 2023 and with continued blood pressure elevation it was recommended he initiate therapy with chlorthalidone  12.5 mg daily in the mornings and continue his additional medications.  On June 30, 2023 he had an additional pharmacy evaluation with Christopher Pavero, RPH and chlorthalidone  was further increased to 25 mg daily.  Presently, Dakota Gibbs denies any chest pain or shortness of breath.  His blood pressure had improved with his prior medication adjustments and systolics typically run in the 120-130 range.  He has had continued issues with back discomfort and had undergone L3-L4 fusion by Dr. Rochelle Chu.  This resolved his sciatica.  He also has had continued issues at L4 3 and L4 and subsequently received a nerve block and had received epidural steroid injection by Dr. Rexanne Catalina.  He continues to use CPAP and now uses [facility] for his D[facility] company.  His ResMed air sense 10 AutoSet unit with set up date April 12, 2020 is set at a pressure of 9 to 15 cm of water .  Recent download shows 100% use with average use approximately 10 hours and 18 minutes.  AHI is increased at 11.3 with central index of 7.3 and obstructive at 2.6.  There is no mask leak.  He is sleeping well.  He is unaware of breakthrough snoring.  He presents for evaluation.  Past Medical History:  Diagnosis Date   Allergy    enviornmental   Anxiety  Anxiety and depression 10/25/2012   Arthritis    self dx   Back pain    L4-L5 bulging disc, L5-S1 - bulging disc, pinched nerve in neck   Cataract    bilateral sx   Coronary artery disease    hx of balloon angioplasty with DR T Loetta Ringer   Depression    Diabetes mellitus    type II   Dyspnea    History of shingles 2009   Hyperlipidemia    on meds   Hypertension    on meds   Myocardial infarction [facility]) 1997   caused by a blood clot   Neuropathy    Sleep apnea    cpap    Past Surgical History:  Procedure Laterality Date    ANGIOPLASTY  1997   BACK SURGERY  04/02/2022   basel cell     skin carcinoma removed x 4   BILATERAL CARPAL TUNNEL RELEASE  2023   CARDIAC CATHETERIZATION     10/04/1999   COLONOSCOPY  2018   SA-MAC-suprep(good)-TA   COSMETIC SURGERY     right wrist - removal of keloid   DOPPLER ECHOCARDIOGRAPHY  07/09/2006   EF 50 to 55 %, LA mildy dilated   HERNIA REPAIR  1951   RIGHT   KNEE ARTHROSCOPY Right 2001   R   NM MYOCAR PERF WALL MOTION  08/22/2011   Mets 13,low risk study   POLYPECTOMY  2018   TA   TOTAL KNEE ARTHROPLASTY Left 08/13/2022   Procedure: TOTAL KNEE ARTHROPLASTY;  Surgeon: Orvan Blanch, MD;  Location: [facility];  Service: Orthopedics;  Laterality: Left;   WRIST SURGERY  1984   right    Allergies  Allergen Reactions   Oxycodone  Diarrhea and Nausea Only    Current Outpatient Medications  Medication Sig Dispense Refill   amLODipine  (NORVASC ) 5 MG tablet Take 1 tablet (5 mg total) by mouth daily. 90 tablet 3   aspirin  EC 81 MG tablet Take 81 mg by mouth daily. Swallow whole.     Blood Pressure Monitoring (OMRON 3 SERIES BP MONITOR) DEVI Use to check blood pressure as needed 1 each 0   cetirizine (ZYRTEC) 10 MG tablet Take 10 mg by mouth daily.     chlorthalidone  (HYGROTON ) 25 MG tablet Take 1 tablet (25 mg total) by mouth daily. 90 tablet 1   citalopram  (CELEXA ) 20 MG tablet TAKE ONE AND ONE-HALF TABLETS BY MOUTH DAILY 135 tablet 1   ezetimibe  (ZETIA ) 10 MG tablet Take 1 tablet (10 mg total) by mouth at bedtime. MUST MAKE FOLLOW UP APPOINTMENT FOR FUTURE REFILLS 90 tablet 0   ferrous sulfate 324 MG TBEC Take 324 mg by mouth.     metFORMIN  (GLUCOPHAGE -XR) 500 MG 24 hr tablet Take 1 tablet (500 mg total) by mouth 2 (two) times daily with a meal. 180 tablet 1   Multiple Vitamin (MULTIVITAMIN) tablet Take 1 tablet by mouth daily.     nitroGLYCERIN  (NITROSTAT ) 0.4 MG SL tablet For chest pain, tightness, or pressure. While sitting, place 1 tablet under tongue. May be used every  5 minutes as needed, for up to 15 minutes. Do not use more than 3 tablets. 25 tablet 6   olmesartan  (BENICAR ) 40 MG tablet TAKE 1 TABLET BY MOUTH AT BEDTIME 90 tablet 3   oxymetazoline  (AFRIN) 0.05 % nasal spray Place 1 spray into both nostrils 2 (two) times daily.     pregabalin  (LYRICA ) 50 MG capsule Take 1 capsule (50 mg total) by mouth  daily. (Patient taking differently: Take 50 mg by mouth 2 (two) times daily.) 30 capsule 3   pyridOXINE (VITAMIN B6) 100 MG tablet Take 100 mg by mouth daily.     trolamine salicylate (ASPERCREME) 10 % cream Apply 1 Application topically as needed for muscle pain.     vitamin C  (ASCORBIC ACID ) 500 MG tablet Take 500 mg by mouth daily.     vitamin E  180 MG (400 UNITS) capsule Take 400 Units by mouth daily.     atorvastatin  (LIPITOR) 20 MG tablet Take 1 tablet (20 mg total) by mouth daily. 90 tablet 3   No current facility-administered medications for this visit.    Social History   Socioeconomic History   Marital status: Married    Spouse name: Not on file   Number of children: 2   Years of education: Not on file   Highest education level: Master's degree (e.g., MA, MS, MEng, MEd, MSW, MBA)  Occupational History   Occupation: executive recruter   Tobacco Use   Smoking status: Some Days    Types: Cigars    Last attempt to quit: 09/2020    Years since quitting: 3.0   Smokeless tobacco: Never   Tobacco comments:    Smokes 1 cigar once a week  Vaping Use   Vaping status: Never Used  Substance and Sexual Activity   Alcohol use: Not Currently    Alcohol/week: 14.0 standard drinks of alcohol    Types: 14 Glasses of wine per week    Comment: 2 glasses of wine per night   Drug use: No   Sexual activity: Yes  Other Topics Concern   Not on file  Social History Narrative   Lives w/ wife      Right Handed    Lives in a two story home   Drinks two cups of coffee daily   retired      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 [employer] Strain: Low  Risk  (09/11/2023)   Overall Financial Resource Strain (CARDIA)    Difficulty of Paying Living Expenses: Not hard at all  Food Insecurity: No Food Insecurity (09/11/2023)   Hunger Vital Sign    Worried About Running Out of Food in the Last Year: Never true    Ran Out of Food in the Last Year: Never true  Transportation Needs: No Transportation Needs (09/11/2023)   PRAPARE - Administrator, Civil Service (Medical): No    Lack of Transportation (Non-Medical): No  Physical Activity: Inactive (09/11/2023)   Exercise Vital Sign    Days of Exercise per Week: 0 days    Minutes of Exercise per Session: 30 min  Stress: No Stress Concern Present (09/11/2023)   [employer] of Occupational Health - Occupational Stress Questionnaire    Feeling of Stress : Not at all  Social Connections: Moderately Integrated (09/11/2023)   Social Connection and Isolation Panel [NHANES]    Frequency of Communication with Friends and Family: Three times a week    Frequency of Social Gatherings with Friends and Family: Twice a week    Attends Religious Services: 1 to 4 times per year    Active Member of [employer] or Organizations: No    Attends Banker Meetings: Not on file    Marital Status: Married  Intimate Partner Violence: Not At Risk (08/16/2022)   Humiliation, Afraid, Rape, and Kick questionnaire    Fear of Current or Ex-Partner: No    Emotionally Abused: No  Physically Abused: No    Sexually Abused: No   Social history is notable in that he is married and has 2 children. He has a dog and walks the dog 1 mile 2 times a day. He is no longer using his treadmill. There is no tobacco use. He does drink alcohol.  Family History  Problem Relation Age of Onset   Stroke Mother        TIA   COPD Mother    Hypertension Sister    Prostate cancer Father 79   Coronary artery disease Father        GM   Diabetes Brother    Prostate cancer Brother 63   Diabetes Maternal Grandmother    Colon  cancer Neg Hx    Esophageal cancer Neg Hx    Colon polyps Neg Hx    Rectal cancer Neg Hx    Stomach cancer Neg Hx     ROS General: Negative; No fevers, chills, or night sweats;  Positive for previous purposeful weight loss of 25 pounds. HEENT: Negative; No changes in vision or hearing, sinus congestion, difficulty swallowing Pulmonary: Negative; No cough, wheezing, shortness of breath, hemoptysis Cardiovascular: See HPI GI: Negative; No nausea, vomiting, diarrhea, or abdominal pain GU: Mild erectile dysfunction; No dysuria, hematuria, or difficulty voiding Musculoskeletal: Left TKR April 2024 Hematologic/Oncology: Negative; no easy bruising, bleeding Endocrine: Negative; no heat/cold intolerance; no diabetes Neuro: Mild short-term memory loss; no changes in balance, headaches Skin: Negative; No rashes or skin lesions Psychiatric: Mild depression/anxiety Sleep: OSA on CPAP  with significant improvement in his prior fatigability and snoring; no bruxism, restless legs, hypnogognic hallucinations, no cataplexy  Other comprehensive 14 point system review is negative.   PE BP 108/62   Pulse (!) 53   Ht 5\' 10"  (1.778 m)   Wt 214 lb (97.1 kg)   SpO2 98%   BMI 30.71 kg/m    Repeat blood pressure by me was 144/64  Wt Readings from Last 3 Encounters:  09/17/23 214 lb (97.1 kg)  09/14/23 210 lb 9.6 oz (95.5 kg)  03/18/23 200 lb (90.7 kg)   General: Alert, oriented, no distress.  Skin: normal turgor, no rashes, warm and dry HEENT: Normocephalic, atraumatic. Pupils equal round and reactive to light; sclera anicteric; extraocular muscles intact; very mild right facial droop. Nose without nasal septal hypertrophy Mouth/Parynx benign; Mallinpatti scale 3 Neck: No JVD, no carotid bruits; normal carotid upstroke Lungs: clear to ausculatation and percussion; no wheezing or rales Chest wall: without tenderness to palpitation Heart: PMI not displaced, RRR, s1 s2 normal, 1/6 systolic  murmur, no diastolic murmur, no rubs, gallops, thrills, or heaves Abdomen: soft, nontender; no hepatosplenomehaly, BS+; abdominal aorta nontender and not dilated by palpation. Back: no CVA tenderness Pulses 2+ Musculoskeletal: full range of motion, normal strength, no joint deformities Extremities: no clubbing cyanosis or edema, Homan's sign negative  Neurologic: grossly nonfocal; Cranial nerves grossly wnl Psychologic: Normal mood and affect   EKG Interpretation Date/Time:  Thursday Sep 17 2023 13:49:14 EDT Ventricular Rate:  53 PR Interval:  138 QRS Duration:  102 QT Interval:  464 QTC Calculation: 435 R Axis:   38  Text Interpretation: Sinus bradycardia Cannot rule out Anterior infarct , age undetermined When compared with ECG of 02-Feb-2023 16:36, No significant change was found Confirmed by Magnus Schuller ([id number]) on 09/17/2023 3:02:20 PM    February 02, 2023 ECG (independently read by me): Marked sinus bradycardia at 44 bpm.  No Ectopy.  She is in  room  November 20, 2020 ECG (independently read by me): Sinus Bradycardia at 48; no ectopy; normal intervals  May 08, 2020 ECG (independently read by me): Sinus bradycardia 52 bpm.  No ectopy.  Normal intervals  August 30,2021 ECG (independently read by me):Sinus Bradycardia at 54; no ectopy, normal intervals  May 2021 ECG (independently read by me): Sinus bradycardia 56 bpm.  No ectopy.  QTc interval 467 ms  August 2020 ECG (independently read by me): Normal sinus rhythm at 61 bpm.  Sep 06, 2018 ECG (independently read by me): Marked sinus bradycardia at 43 bpm.  No ST segment changes.  PR interval 148 ms, QTc interval 419 ms.  March 2019 ECG (independently read by me): sinus bradycardia 51 bpm.  No ectopy.  Normal intervals.  March 2018 ECG (independently read by me): Sinus bradycardia 52 bpm.  Small nondiagnostic inferior Q waves.  Normal intervals  November 2016 ECG (independently read by me):  Sinus bradycardia at 49 bpm.   Small inferior Q waves with preserved R waves.  February 2016 ECG (independently read by me): Sinus bradycardia 46 bpm.  No ectopy.  February 2015 ECG (independently read by me): Normal sinus rhythm at 62 beats per minute. Small nondiagnostic inferior Q waves. Observed R waves.  LABS:     Latest Ref Rng & Units 09/14/2023   10:20 AM 03/16/2023    2:30 PM 09/01/2022   10:05 AM  BMP  Glucose 70 - 99 mg/dL 161  096  045   BUN 6 - 23 mg/dL 27  25  14    Creatinine 0.40 - 1.50 mg/dL 4.09  8.11  9.14   BUN/Creat Ratio 10 - 24   13   Sodium 135 - 145 mEq/L 136  142  137   Potassium 3.5 - 5.1 mEq/L 4.5  4.3  5.3   Chloride 96 - 112 mEq/L 99  105  99   CO2 19 - 32 mEq/L 30  26  21    Calcium  8.4 - 10.5 mg/dL 9.3  9.0  9.4       Component Value Date/Time   PROT 6.9 09/14/2023 1020   PROT 6.8 04/25/2020 0854   ALBUMIN 4.2 09/14/2023 1020   ALBUMIN 4.3 04/25/2020 0854   AST 15 09/14/2023 1020   ALT 17 09/14/2023 1020   ALKPHOS 74 09/14/2023 1020   BILITOT 0.4 09/14/2023 1020   BILITOT 0.7 04/25/2020 0854   BILIDIR 0.1 08/03/2020 1031       Latest Ref Rng & Units 09/14/2023   10:20 AM 03/16/2023    2:30 PM 08/19/2022    2:10 AM  CBC  WBC 4.0 - 10.5 K/uL 10.6  8.1  7.6   Hemoglobin 13.0 - 17.0 g/dL 78.2  95.6  21.3   Hematocrit 39.0 - 52.0 % 42.4  37.7  32.1   Platelets 150.0 - 400.0 K/uL 248.0  221  302    Lab Results  Component Value Date   MCV 95.4 09/14/2023   MCV 93.5 03/16/2023   MCV 90.7 08/19/2022   Lab Results  Component Value Date   TSH 2.28 11/24/2022   Lab Results  Component Value Date   HGBA1C 6.5 09/14/2023    BNP No results found for: "PROBNP"  Lipid Panel     Component Value Date/Time   CHOL 143 09/14/2023 1020   CHOL 157 04/25/2020 0854   TRIG 156.0 (H) 09/14/2023 1020   HDL 60.50 09/14/2023 1020   HDL 77 04/25/2020 0854   CHOLHDL  2 09/14/2023 1020   VLDL 31.2 09/14/2023 1020   LDLCALC 52 09/14/2023 1020   LDLCALC 54 04/25/2020 0854     RADIOLOGY: No results found.  IMPRESSION:  1. Atherosclerosis of native coronary artery of native heart without angina pectoris   2. ST elevation myocardial infarction involving right coronary artery [facility]): Stent to RCA and staged intervention to circumflex marginal vessel in January 1997   3. Primary hypertension   4. OSA (obstructive sleep apnea)   5. Hyperlipidemia LDL goal < 55   6. Discomfort of back    ASSESSMENT AND PLAN:  Dakota Gibbs is a 73 year old Caucasian male who is 28 years status post his inferior wall myocardial infarction at which time he underwent acute intervention to a totally occluded RCA and staged intervention to circumflex marginal vessel in January 1997. His last catheterization in 2001 showed plaque regression on his aggressive medical regimen.  He has been aggressively treated and has remained stable without recurrent anginal symptoms.  A Holter monitor obtained in September 2020 revealed predominant sinus rhythm with occasional isolated PVCs although there was one 4 beat episode of multifocal ventricular tachycardia.  His beta-blocker dose had been reduced because of bradycardia with heart rates in the 40s.  An echo Doppler studies demonstrated normal systolic function with EF of 60 to 65%.  Due to significant complaints of fatigability and being tired all the time, he underwent a sleep study which confirmed severe sleep apnea with an AHI of 39.2/h, RDI 57.4.  AHI while supine was very severe at 58.1 and REM sleep AHI was 30.0.  O2 nadir was 85%.  He has been on CPAP therapy since April 12, 2020.  At his prior evaluations adjustments were made to his CPAP unit.  He underwent a follow-up echo Doppler study following a COVID-19 infection this continue to show normal LV systolic function EF of 55 to 16% and grade 1 diastolic dysfunction.  His blood pressure today is relatively stable.  When seen by me in 2022 he had experienced presyncope/syncope which may have been  in the setting of significant dehydration during extreme heat working outside for some time in the yard and digging up plants.  He underwent left knee total replacement surgery in April 2024 and was hospitalized several days later with fever.  CT was negative for PE or infiltrate.  He was treated with IV vancomycin  and Zosyn  and reassessed by orthopedics.  Over the past year, his medications for blood pressure have been adjusted due to hypertension and he is now on amlodipine  5 mg, olmesartan  40 mg, chlorthalidone  25 mg daily.  He is on atorvastatin  20 mg and Zetia  10 mg for hyperlipidemia.  He is diabetic on metformin  currently 500 mg twice a day.  He is taking pregabalin  for his neuropathy.  He has continued to use CPAP therapy.  He believes he is sleeping well.  His AHI on download today was increased at 11.3 and there are components of both central and obstructive events.  His pressure set at a range of 9 to 15 cm of water  and his 95th percentile pressure is 12.3 with maximum average pressure 13.3.  He does not have any mask leak.  He continues to be followed by Dr. Tisovec for primary care.  He has undergone surgery for his back discomfort and sciatica with L3-L4 fusion and also had a trial of nerve block.  He will continue to monitor his blood pressure.  He is aware of my upcoming  retirement and I will transition him to the cardiology care of Dr. Carson Clara with follow-up in 8 to 9 months.  He will continue to use current CPAP.  If he continues to have issues with increased events, subsequent evaluation with possible BiPAP titration can be considered.  He will see Dr. Micael Adas for follow-up sleep evaluation following my retirement.   Millicent Ally, MD, [facility]  09/17/2023 6:08 PM

## 2023-09-20 ENCOUNTER — Other Ambulatory Visit: Payer: Self-pay | Admitting: Family Medicine

## 2023-09-20 DIAGNOSIS — F419 Anxiety disorder, unspecified: Secondary | ICD-10-CM

## 2023-09-21 ENCOUNTER — Other Ambulatory Visit: Payer: Self-pay

## 2023-09-21 DIAGNOSIS — M722 Plantar fascial fibromatosis: Secondary | ICD-10-CM | POA: Diagnosis not present

## 2023-09-21 MED ORDER — EZETIMIBE 10 MG PO TABS: 10.0000 mg | ORAL_TABLET | Freq: Every day | ORAL | 3 refills | Status: AC

## 2023-10-04 DIAGNOSIS — E119 Type 2 diabetes mellitus without complications: Secondary | ICD-10-CM | POA: Diagnosis not present

## 2023-10-11 DIAGNOSIS — M7918 Myalgia, other site: Secondary | ICD-10-CM | POA: Diagnosis not present

## 2023-10-11 DIAGNOSIS — M549 Dorsalgia, unspecified: Secondary | ICD-10-CM | POA: Diagnosis not present

## 2023-10-30 ENCOUNTER — Emergency Department (HOSPITAL_BASED_OUTPATIENT_CLINIC_OR_DEPARTMENT_OTHER)
Admission: EM | Admit: 2023-10-30 | Discharge: 2023-10-30 | Disposition: A | Discharge status: Home / Self Care | Attending: Emergency Medicine | Admitting: Emergency Medicine

## 2023-10-30 ENCOUNTER — Ambulatory Visit: Admission: EM | Admit: 2023-10-30 | Discharge: 2023-10-30 | Disposition: A | Discharge status: Home / Self Care

## 2023-10-30 ENCOUNTER — Emergency Department (HOSPITAL_BASED_OUTPATIENT_CLINIC_OR_DEPARTMENT_OTHER)

## 2023-10-30 ENCOUNTER — Emergency Department (HOSPITAL_BASED_OUTPATIENT_CLINIC_OR_DEPARTMENT_OTHER): Admitting: Radiology

## 2023-10-30 ENCOUNTER — Other Ambulatory Visit: Payer: Self-pay

## 2023-10-30 DIAGNOSIS — S0081XA Abrasion of other part of head, initial encounter: Secondary | ICD-10-CM | POA: Diagnosis not present

## 2023-10-30 DIAGNOSIS — W19XXXA Unspecified fall, initial encounter: Secondary | ICD-10-CM

## 2023-10-30 DIAGNOSIS — E119 Type 2 diabetes mellitus without complications: Secondary | ICD-10-CM | POA: Diagnosis not present

## 2023-10-30 DIAGNOSIS — M25532 Pain in left wrist: Secondary | ICD-10-CM | POA: Diagnosis not present

## 2023-10-30 DIAGNOSIS — S0121XA Laceration without foreign body of nose, initial encounter: Secondary | ICD-10-CM | POA: Insufficient documentation

## 2023-10-30 DIAGNOSIS — T07XXXA Unspecified multiple injuries, initial encounter: Secondary | ICD-10-CM | POA: Diagnosis not present

## 2023-10-30 DIAGNOSIS — I2510 Atherosclerotic heart disease of native coronary artery without angina pectoris: Secondary | ICD-10-CM | POA: Insufficient documentation

## 2023-10-30 DIAGNOSIS — W010XXA Fall on same level from slipping, tripping and stumbling without subsequent striking against object, initial encounter: Secondary | ICD-10-CM | POA: Insufficient documentation

## 2023-10-30 DIAGNOSIS — Z7984 Long term (current) use of oral hypoglycemic drugs: Secondary | ICD-10-CM | POA: Diagnosis not present

## 2023-10-30 DIAGNOSIS — S80212A Abrasion, left knee, initial encounter: Secondary | ICD-10-CM | POA: Insufficient documentation

## 2023-10-30 DIAGNOSIS — S80211A Abrasion, right knee, initial encounter: Secondary | ICD-10-CM | POA: Diagnosis not present

## 2023-10-30 DIAGNOSIS — Z7982 Long term (current) use of aspirin: Secondary | ICD-10-CM | POA: Diagnosis not present

## 2023-10-30 DIAGNOSIS — Z79899 Other long term (current) drug therapy: Secondary | ICD-10-CM | POA: Insufficient documentation

## 2023-10-30 DIAGNOSIS — F17200 Nicotine dependence, unspecified, uncomplicated: Secondary | ICD-10-CM | POA: Insufficient documentation

## 2023-10-30 DIAGNOSIS — R220 Localized swelling, mass and lump, head: Secondary | ICD-10-CM | POA: Insufficient documentation

## 2023-10-30 DIAGNOSIS — S6992XA Unspecified injury of left wrist, hand and finger(s), initial encounter: Secondary | ICD-10-CM | POA: Diagnosis not present

## 2023-10-30 DIAGNOSIS — S0990XA Unspecified injury of head, initial encounter: Secondary | ICD-10-CM

## 2023-10-30 DIAGNOSIS — S01511A Laceration without foreign body of lip, initial encounter: Secondary | ICD-10-CM

## 2023-10-30 DIAGNOSIS — I6782 Cerebral ischemia: Secondary | ICD-10-CM | POA: Diagnosis not present

## 2023-10-30 DIAGNOSIS — I10 Essential (primary) hypertension: Secondary | ICD-10-CM | POA: Insufficient documentation

## 2023-10-30 DIAGNOSIS — I672 Cerebral atherosclerosis: Secondary | ICD-10-CM | POA: Diagnosis not present

## 2023-10-30 DIAGNOSIS — S0993XA Unspecified injury of face, initial encounter: Secondary | ICD-10-CM | POA: Diagnosis not present

## 2023-10-30 MED ORDER — ACETAMINOPHEN 325 MG PO TABS
650.0000 mg | ORAL_TABLET | Freq: Once | ORAL | Status: AC
  Administered 2023-10-30: 650 mg via ORAL
  Filled 2023-10-30: qty 2

## 2023-10-30 MED ORDER — ACETAMINOPHEN 325 MG PO TABS: 650.0000 mg | ORAL_TABLET | Freq: Once | ORAL | Status: DC

## 2023-10-30 NOTE — ED Triage Notes (Signed)
 Pt c/o mechanical fall this morning, fell on my face on the concrete. Advises he bit his lip. C/o L wrist pain, pain to several abrasions but my lip is the issue, it's torn up on the inside. Denies HA, blurred vision, NV since fall.

## 2023-10-30 NOTE — Discharge Instructions (Signed)
 Make an appointment to follow-up with hand surgery regarding the left wrist injury.  Can wash your wounds on the face and the abrasions on your knees with soap and water  daily.  Apply antibiotic ointment.  For the inner lip laceration would recommend mouthwash without alcohol or using saline rinses.

## 2023-10-30 NOTE — ED Provider Notes (Incomplete)
 [place] EMERGENCY DEPARTMENT AT [facility] Provider Note   CSN: [id number] Arrival date & time: 10/30/23  1629     Patient presents with: Dakota Gibbs is a 73 y.o. male.  {Add pertinent medical, surgical, social history, OB history to YEP:67052} Patient fell outside this morning at 1000 on his driveway.  Lost his balance and went down.  Abrasions to the forehead bridge of the nose and lower lip.  Laceration inside the lip area.  If through and through very minimal.  No loss of consciousness.  Also complaint of left wrist pain.  And some abrasions to both knees.  Patient states tetanus is up-to-date.  Patient takes a baby aspirin  a day has not on any other significant blood thinners.  Past medical history significant for diabetes coronary artery disease hyperlipidemia hypertension and neuropathy.  Patient still smokes some days.  Patient states there never was any significant nosebleed.       Prior to Admission medications   Medication Sig Start Date End Date Taking? Authorizing Provider  amLODipine  (NORVASC ) 5 MG tablet Take 1 tablet (5 mg total) by mouth daily. 04/27/23   Burnard Debby LABOR, MD  aspirin  EC 81 MG tablet Take 81 mg by mouth daily. Swallow whole.    [provider]  atorvastatin  (LIPITOR) 20 MG tablet Take 1 tablet (20 mg total) by mouth daily. 02/02/23 05/03/23  Burnard Debby LABOR, MD  Blood Pressure Monitoring (OMRON 3 SERIES BP MONITOR) DEVI Use to check blood pressure as needed 06/30/23   Burnard Debby LABOR, MD  cetirizine (ZYRTEC) 10 MG tablet Take 10 mg by mouth daily.    [provider]  chlorthalidone  (HYGROTON ) 25 MG tablet Take 1 tablet (25 mg total) by mouth daily. 06/30/23   Burnard Debby LABOR, MD  citalopram  (CELEXA ) 20 MG tablet TAKE ONE AND ONE-HALF TABLETS BY MOUTH DAILY 09/21/23   Berneta Elsie Sayre, MD  ezetimibe  (ZETIA ) 10 MG tablet Take 1 tablet (10 mg total) by mouth at bedtime. 09/21/23   Burnard Debby LABOR, MD  ferrous  sulfate 324 MG TBEC Take 324 mg by mouth.    [provider]  metFORMIN  (GLUCOPHAGE -XR) 500 MG 24 hr tablet Take 1 tablet (500 mg total) by mouth 2 (two) times daily with a meal. 09/14/23   Berneta Elsie Sayre, MD  Multiple Vitamin (MULTIVITAMIN) tablet Take 1 tablet by mouth daily.    [provider]  nitroGLYCERIN  (NITROSTAT ) 0.4 MG SL tablet For chest pain, tightness, or pressure. While sitting, place 1 tablet under tongue. May be used every 5 minutes as needed, for up to 15 minutes. Do not use more than 3 tablets. 06/16/22   Burnard Debby LABOR, MD  olmesartan  (BENICAR ) 40 MG tablet TAKE 1 TABLET BY MOUTH AT BEDTIME 07/07/23   Burnard Debby LABOR, MD  oxymetazoline  (AFRIN) 0.05 % nasal spray Place 1 spray into both nostrils 2 (two) times daily.    [provider]  pregabalin  (LYRICA ) 50 MG capsule Take 1 capsule (50 mg total) by mouth daily. Patient taking differently: Take 50 mg by mouth 2 (two) times daily. 01/09/23   Wertman, Sara E, PA-C  pyridOXINE (VITAMIN B6) 100 MG tablet Take 100 mg by mouth daily.    [provider]  trolamine salicylate (ASPERCREME) 10 % cream Apply 1 Application topically as needed for muscle pain.    [provider]  vitamin C  (ASCORBIC ACID ) 500 MG tablet Take 500 mg by mouth daily.  [provider]  vitamin E  180 MG (400 UNITS) capsule Take 400 Units by mouth daily.    [provider]    Allergies: Oxycodone     Review of Systems  Constitutional:  Negative for chills and fever.  HENT:  Positive for facial swelling. Negative for ear pain and sore throat.   Eyes:  Negative for pain and visual disturbance.  Respiratory:  Negative for cough and shortness of breath.   Cardiovascular:  Negative for chest pain and palpitations.  Gastrointestinal:  Negative for abdominal pain and vomiting.  Genitourinary:  Negative for dysuria and hematuria.  Musculoskeletal:  Negative for arthralgias, back pain and neck pain.   Skin:  Negative for color change and rash.  Neurological:  Negative for seizures and syncope.  All other systems reviewed and are negative.   Updated Vital Signs BP 125/74 (BP Location: Right Arm)   Pulse (!) 56   Temp (!) 97.4 F (36.3 C)   Resp 14   SpO2 98%   Physical Exam Vitals and nursing note reviewed.  Constitutional:      General: He is not in acute distress.    Appearance: Normal appearance. He is well-developed. He is not ill-appearing.  HENT:     Head: Normocephalic.     Comments: Abrasion to the forehead more so on the left.  Abrasion to the bridge of the nose.  With a very small 5 mm laceration at the bridge.  Bleeding controlled.    Nose:     Comments: No bleeding.  No septal hematoma.  Septum intact and in good location.  No obvious nose deformity.    Mouth/Throat:     Mouth: Mucous membranes are moist.     Comments: Inner lip with laceration measuring about 1 cm.  Questionable maybe a few millimeter through and through on the outside but is pretty much closed now.  Opinions to be more of an abrasion.  Teeth intact  Eyes:     Extraocular Movements: Extraocular movements intact.     Conjunctiva/sclera: Conjunctivae normal.     Pupils: Pupils are equal, round, and reactive to light.    Cardiovascular:     Rate and Rhythm: Normal rate and regular rhythm.     Heart sounds: No murmur heard. Pulmonary:     Effort: Pulmonary effort is normal. No respiratory distress.     Breath sounds: Normal breath sounds.  Abdominal:     Palpations: Abdomen is soft.     Tenderness: There is no abdominal tenderness.   Musculoskeletal:        General: Tenderness and signs of injury present. No swelling or deformity.     Cervical back: Neck supple. No rigidity or tenderness.     Comments: Swelling to the thenar eminence.  No snuffbox tenderness.  Radial pulse 2+.  Sensation intact to fingers good range of motion of the thumb.  Good movement at the elbow and shoulder.  Good  movement of all the other fingers.  Superficial abrasions to both knees.   Skin:    General: Skin is warm and dry.     Capillary Refill: Capillary refill takes less than 2 seconds.   Neurological:     General: No focal deficit present.     Mental Status: He is alert and oriented to person, place, and time.     Cranial Nerves: No cranial nerve deficit.     Sensory: No sensory deficit.     Motor: No weakness.   Psychiatric:  Mood and Affect: Mood normal.     (all labs ordered are listed, but only abnormal results are displayed) Labs Reviewed - No data to display  EKG: None  Radiology: CT Head Wo Contrast Result Date: 10/30/2023 CLINICAL DATA:  Blunt polytrauma from fall EXAM: CT HEAD WITHOUT CONTRAST TECHNIQUE: Contiguous axial images were obtained from the base of the skull through the vertex without intravenous contrast. RADIATION DOSE REDUCTION: This exam was performed according to the departmental dose-optimization program which includes automated exposure control, adjustment of the mA and/or kV according to patient size and/or use of iterative reconstruction technique. COMPARISON:  MRI head 06/21/2018 FINDINGS: Brain: No intracranial hemorrhage, mass effect, or evidence of acute infarct. No hydrocephalus. No extra-axial fluid collection. Age-commensurate cerebral atrophy and chronic small vessel ischemic disease. Vascular: No hyperdense vessel. Intracranial arterial calcification. Skull: No fracture or focal lesion. Sinuses/Orbits: No acute finding. Other: None. IMPRESSION: No acute intracranial abnormality. Electronically Signed   By: Norman Gatlin M.D.   On: 10/30/2023 18:02   DG Wrist Complete Left Result Date: 10/30/2023 CLINICAL DATA:  fall EXAM: LEFT WRIST - COMPLETE 3+ VIEW COMPARISON:  None Available. FINDINGS: No acute fracture or dislocation. Moderate joint space loss of the first carpometacarpal joint. Peripheral vascular atherosclerosis. IMPRESSION: 1. No acute  fracture or dislocation. 2. 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 Electronically Signed   By: Rogelia Myers M.D.   On: 10/30/2023 17:56    {Document cardiac monitor, telemetry assessment procedure when appropriate:32947} Procedures   Medications Ordered in the ED  acetaminophen  (TYLENOL ) tablet 650 mg (650 mg Oral Given 10/30/23 2038)      {Click here for ABCD2, HEART and other calculators REFRESH Note before signing:1}                              Medical Decision Making Amount and/or Complexity of Data Reviewed Radiology: ordered.  Risk OTC drugs.  CT head without any acute findings.  X-ray of the left wrist without any bony abnormalities.  No fracture no dislocation.  Patient stable for discharge home.  No reason to suture the inner lip laceration at this point.  Would recommend patient follow-up with orthopedics if left wrist does not feel completely better in a week.  Again no snuffbox tenderness.  Abrasions to the forehead nose.  No obvious or significant nose deformity.  No neck pain no back pain  Final diagnoses:  None    ED Discharge Orders     None

## 2023-10-30 NOTE — ED Provider Notes (Signed)
 [facility]    CSN: [id number] Arrival date & time: 10/30/23  1507      History   Chief Complaint Chief Complaint  Patient presents with   Fall   Lip Laceration    HPI Dakota Gibbs is a 73 y.o. male.   HPI Pt reports falling at approx 10 Am this morning  He hit his face and came down harder on his left hand than the right He has abrasions to the palms and knees bilaterally  He has cleansed his abrasions and scrapes with peroxide   He denies LOC with fall, headache, lightheadedness, nausea or vomiting  He takes a baby aspirin  but is otherwise not on any blood thinners   Past Medical History:  Diagnosis Date   Allergy    enviornmental   Anxiety    Anxiety and depression 10/25/2012   Arthritis    self dx   Back pain    L4-L5 bulging disc, L5-S1 - bulging disc, pinched nerve in neck   Cataract    bilateral sx   Coronary artery disease    hx of balloon angioplasty with DR T Burnard   Depression    Diabetes mellitus    type II   Dyspnea    History of shingles 2009   Hyperlipidemia    on meds   Hypertension    on meds   Myocardial infarction (HCC) 1997   caused by a blood clot   Neuropathy    Sleep apnea    cpap    Patient Active Problem List   Diagnosis Date Noted   Plantar fasciitis 09/14/2023   S/P lumbar fusion 03/18/2023   Macrocytic anemia 03/17/2023   Lumbosacral radiculopathy at L5 12/31/2022   Thiamine  deficiency 10/22/2022   PVC (premature ventricular contraction) 08/18/2022   Sepsis (HCC) 08/17/2022   Hypokalemia 08/17/2022   Hyponatremia 08/16/2022   SOB (shortness of breath) 08/16/2022   S/P TKR (total knee replacement) using cement, left 08/13/2022   Cardiomegaly 06/23/2022   Hepatic steatosis 06/23/2022   Abdominal bloating 06/02/2022   Leg pain 02/18/2022   Injury of right shoulder 02/17/2022   Large fiber neuropathy 02/17/2022   Need for influenza vaccination 02/17/2022   Prediabetes 12/03/2021   Bilateral  carpal tunnel syndrome 10/01/2021   Abnormal urinalysis 06/10/2021   Receptive aphasia 05/30/2021   Memory loss 05/30/2021   Cough due to ACE inhibitor 08/27/2020   Cough 08/03/2020   Benign prostatic hyperplasia with post-void dribbling 08/03/2020   Paresthesias 08/03/2020   Normocytic anemia 08/03/2020   Alcohol use 07/22/2019   Essential hypertension 07/22/2019   Anxiety 11/08/2018   Basal cell carcinoma (BCC) in situ of skin 05/14/2017   Sinus bradycardia 03/15/2015   Coronary artery disease involving native coronary artery of native heart without angina pectoris 03/15/2015   Screening for prostate cancer 02/21/2015   Hyperlipidemia with target LDL less than 70 06/08/2013   Palpitations 06/08/2013   DJD (degenerative joint disease) 03/16/2013   Healthcare maintenance 03/16/2013   Osteoarthritis 03/16/2013   Anxiety and depression 10/25/2012   Lipoma 03/20/2011   DM2 (diabetes mellitus, type 2) (HCC) 07/10/2009   ERECTILE DYSFUNCTION 02/21/2008   CORONARY ARTERY DISEASE 02/21/2008   GERD 02/21/2008    Past Surgical History:  Procedure Laterality Date   ANGIOPLASTY  1997   BACK SURGERY  04/02/2022   basel cell     skin carcinoma removed x 4   BILATERAL CARPAL TUNNEL RELEASE  2023   CARDIAC CATHETERIZATION  10/04/1999   COLONOSCOPY  2018   SA-MAC-suprep(good)-TA   COSMETIC SURGERY     right wrist - removal of keloid   DOPPLER ECHOCARDIOGRAPHY  07/09/2006   EF 50 to 55 %, LA mildy dilated   HERNIA REPAIR  1951   RIGHT   KNEE ARTHROSCOPY Right 2001   R   NM MYOCAR PERF WALL MOTION  08/22/2011   Mets 13,low risk study   POLYPECTOMY  2018   TA   TOTAL KNEE ARTHROPLASTY Left 08/13/2022   Procedure: TOTAL KNEE ARTHROPLASTY;  Surgeon: Duwayne Purchase, MD;  Location: [facility];  Service: Orthopedics;  Laterality: Left;   WRIST SURGERY  1984   right       Home Medications    Prior to Admission medications   Medication Sig Start Date End Date Taking? Authorizing  Provider  amLODipine  (NORVASC ) 5 MG tablet Take 1 tablet (5 mg total) by mouth daily. 04/27/23   Burnard Debby LABOR, MD  aspirin  EC 81 MG tablet Take 81 mg by mouth daily. Swallow whole.    [provider]  atorvastatin  (LIPITOR) 20 MG tablet Take 1 tablet (20 mg total) by mouth daily. 02/02/23 05/03/23  Burnard Debby LABOR, MD  Blood Pressure Monitoring (OMRON 3 SERIES BP MONITOR) DEVI Use to check blood pressure as needed 06/30/23   Burnard Debby LABOR, MD  cetirizine (ZYRTEC) 10 MG tablet Take 10 mg by mouth daily.    [provider]  chlorthalidone  (HYGROTON ) 25 MG tablet Take 1 tablet (25 mg total) by mouth daily. 06/30/23   Burnard Debby LABOR, MD  citalopram  (CELEXA ) 20 MG tablet TAKE ONE AND ONE-HALF TABLETS BY MOUTH DAILY 09/21/23   Berneta Elsie Sayre, MD  ezetimibe  (ZETIA ) 10 MG tablet Take 1 tablet (10 mg total) by mouth at bedtime. 09/21/23   Burnard Debby LABOR, MD  ferrous sulfate 324 MG TBEC Take 324 mg by mouth.    [provider]  metFORMIN  (GLUCOPHAGE -XR) 500 MG 24 hr tablet Take 1 tablet (500 mg total) by mouth 2 (two) times daily with a meal. 09/14/23   Berneta Elsie Sayre, MD  Multiple Vitamin (MULTIVITAMIN) tablet Take 1 tablet by mouth daily.    [provider]  nitroGLYCERIN  (NITROSTAT ) 0.4 MG SL tablet For chest pain, tightness, or pressure. While sitting, place 1 tablet under tongue. May be used every 5 minutes as needed, for up to 15 minutes. Do not use more than 3 tablets. 06/16/22   Burnard Debby LABOR, MD  olmesartan  (BENICAR ) 40 MG tablet TAKE 1 TABLET BY MOUTH AT BEDTIME 07/07/23   Burnard Debby LABOR, MD  oxymetazoline  (AFRIN) 0.05 % nasal spray Place 1 spray into both nostrils 2 (two) times daily.    [provider]  pregabalin  (LYRICA ) 50 MG capsule Take 1 capsule (50 mg total) by mouth daily. Patient taking differently: Take 50 mg by mouth 2 (two) times daily. 01/09/23   Wertman, Sara E, PA-C  pyridOXINE (VITAMIN B6) 100 MG tablet Take 100 mg by  mouth daily.    [provider]  trolamine salicylate (ASPERCREME) 10 % cream Apply 1 Application topically as needed for muscle pain.    [provider]  vitamin C  (ASCORBIC ACID ) 500 MG tablet Take 500 mg by mouth daily.    [provider]  vitamin E  180 MG (400 UNITS) capsule Take 400 Units by mouth daily.    [provider]    Family History Family History  Problem Relation Age of Onset  Stroke Mother        TIA   COPD Mother    Hypertension Sister    Prostate cancer Father 106   Coronary artery disease Father        GM   Diabetes Brother    Prostate cancer Brother 53   Diabetes Maternal Grandmother    Colon cancer Neg Hx    Esophageal cancer Neg Hx    Colon polyps Neg Hx    Rectal cancer Neg Hx    Stomach cancer Neg Hx     Social History Social History   Tobacco Use   Smoking status: Some Days    Types: Cigars    Last attempt to quit: 09/2020    Years since quitting: 3.1   Smokeless tobacco: Never   Tobacco comments:    Smokes 1 cigar once a week  Vaping Use   Vaping status: Never Used  Substance Use Topics   Alcohol use: Not Currently    Alcohol/week: 14.0 standard drinks of alcohol    Types: 14 Glasses of wine per week    Comment: 2 glasses of wine per night   Drug use: No     Allergies   Oxycodone    Review of Systems Review of Systems  Musculoskeletal:  Positive for arthralgias and myalgias.  Skin:  Positive for wound.  Neurological:  Negative for dizziness, syncope, light-headedness and headaches.     Physical Exam Triage Vital Signs ED Triage Vitals  Encounter Vitals Group     BP 10/30/23 1525 (!) 118/50     Girls Systolic BP Percentile --      Girls Diastolic BP Percentile --      Boys Systolic BP Percentile --      Boys Diastolic BP Percentile --      Pulse Rate 10/30/23 1525 (!) 59     Resp 10/30/23 1525 17     Temp 10/30/23 1525 98.2 F (36.8 C)     Temp Source 10/30/23 1525 Oral     SpO2  10/30/23 1525 93 %     Weight 10/30/23 1527 205 lb (93 kg)     Height 10/30/23 1527 5' 10 (1.778 m)     Head Circumference --      Peak Flow --      Pain Score 10/30/23 1526 3     Pain Loc --      Pain Education --      Exclude from Growth Chart --    No data found.  Updated Vital Signs BP (!) 118/50 (BP Location: Right Arm)   Pulse (!) 59   Temp 98.2 F (36.8 C) (Oral)   Resp 17   Ht 5' 10 (1.778 m)   Wt 205 lb (93 kg)   SpO2 93%   BMI 29.41 kg/m   Visual Acuity Right Eye Distance:   Left Eye Distance:   Bilateral Distance:    Right Eye Near:   Left Eye Near:    Bilateral Near:     Physical Exam Vitals reviewed.  Constitutional:      Appearance: Normal appearance.  HENT:     Head: Normocephalic. Abrasion, contusion and laceration present. No raccoon eyes, Battle's sign, right periorbital erythema or left periorbital erythema.      Nose: Nasal deformity, signs of injury and laceration present.     Right Nostril: Epistaxis present. No septal hematoma or occlusion.     Left Nostril: Epistaxis present. No septal hematoma or occlusion.  Mouth/Throat:     Mouth: Injury and lacerations present.      Comments: Pt has notable laceration to the bottom lip which does not appear to be a through and through but there is involvement of mucosal and submucosal layers and vermilion border.   Musculoskeletal:     Comments: Pt has snuffbox tenderness along left hand and wrist He has decreased ROM of the left wrist with regards to flexion and extension. Lateral flexion is intact. He is able to flex and extend fingers and appears to have intact cap refill at the distal aspect of multiple fingers.  Left wrist is mildly swollen compared to right    Skin:    Comments: Pt has abrasions to the palms of both hands without significant lacerations or bruising present     Neurological:     Mental Status: He is alert and oriented to person, place, and time.     GCS: GCS eye subscore  is 4. GCS verbal subscore is 5. GCS motor subscore is 6.     Cranial Nerves: No cranial nerve deficit, dysarthria or facial asymmetry.     Motor: No weakness, tremor, atrophy or abnormal muscle tone.     Gait: Gait is intact.   Psychiatric:        Attention and Perception: Attention and perception normal.        Mood and Affect: Mood and affect normal.        Speech: Speech normal.        Behavior: Behavior normal. Behavior is cooperative.      UC Treatments / Results  Labs (all labs ordered are listed, but only abnormal results are displayed) Labs Reviewed - No data to display  EKG   Radiology No results found.  Procedures Procedures (including critical care time)  Medications Ordered in UC Medications - No data to display  Initial Impression / Assessment and Plan / UC Course  I have reviewed the triage vital signs and the nursing notes.  Pertinent labs & imaging results that were available during my care of the patient were reviewed by me and considered in my medical decision making (see chart for details).      Final Clinical Impressions(s) / UC Diagnoses   Final diagnoses:  Fall, initial encounter  Complicated laceration of lip, initial encounter  Left wrist injury, initial encounter  Abrasions of multiple sites   Patient presents today for concerns of multiple abrasions and injuries following a fall earlier this morning.  Physical exam is notable for potential through and through laceration to the bottom lip, decreased range of motion of the left wrist as well as mild swelling, multiple abrasions to the face and hand.  Patient denies loss of consciousness, nausea, vomiting, persistent headache, dizziness or lightheadedness following the fall.  I reviewed with patient that he will need imaging of his left wrist and potentially upper face to ensure that his nose is not broken due to the fall.  I also reviewed with patient that his lip laceration may require  specialized closure and recommend that he be seen and evaluated in the emergency room as we do not have imaging today in clinic.  Patient declines EMS transportation and states that he will get to ED via private vehicle.    Discharge Instructions      Please go to the nearest med center or ED for evaluation as you need imaging of your head and left hand as well as more skilled suturing of your  lip.      ED Prescriptions   None    PDMP not reviewed this encounter.   Marylene Rocky BRAVO, PA-C 10/30/23 1631

## 2023-10-30 NOTE — ED Triage Notes (Signed)
 Pt presents to urgent care following a fall this morning, 6/27, at approximately 10 AM. States he was in the driveway and fell face forward onto the concrete. Pt reports he did bite his lip when falling. Also fell directly on his left hand. Denies LOC, remembers the fall. Ice applied + Tylenol  Arthritis taken with no noticeable pain relief.

## 2023-10-30 NOTE — Discharge Instructions (Addendum)
 Please go to the nearest med center or ED for evaluation as you need imaging of your head and left hand as well as more skilled suturing of your lip.

## 2023-10-30 NOTE — ED Notes (Signed)
 Patient is being discharged from the Urgent Care and sent to the Emergency Department via private vehicle . Per Rocky Mecum PA, patient is in need of higher level of care due to face-forward fall this morning, 6/27 and lip laceration. Patient is aware and verbalizes understanding of plan of care.    Vitals:   10/30/23 1525  BP: (!) 118/50  Pulse: (!) 59  Resp: 17  Temp: 98.2 F (36.8 C)  SpO2: 93%

## 2023-10-30 NOTE — ED Provider Notes (Signed)
 [place] EMERGENCY DEPARTMENT AT [facility] Provider Note   CSN: [id number] Arrival date & time: 10/30/23  1629     Patient presents with: Dakota Gibbs is a 73 y.o. male.   Patient fell outside this morning at 1000 on his driveway.  Lost his balance and went down.  Abrasions to the forehead bridge of the nose and lower lip.  Laceration inside the lip area.  If through and through very minimal.  No loss of consciousness.  Also complaint of left wrist pain.  And some abrasions to both knees.  Patient states tetanus is up-to-date.  Patient takes a baby aspirin  a day has not on any other significant blood thinners.  Past medical history significant for diabetes coronary artery disease hyperlipidemia hypertension and neuropathy.  Patient still smokes some days.  Patient states there never was any significant nosebleed.       Prior to Admission medications   Medication Sig Start Date End Date Taking? Authorizing Provider  amLODipine  (NORVASC ) 5 MG tablet Take 1 tablet (5 mg total) by mouth daily. 04/27/23   Burnard Debby LABOR, MD  aspirin  EC 81 MG tablet Take 81 mg by mouth daily. Swallow whole.    [provider]  atorvastatin  (LIPITOR) 20 MG tablet Take 1 tablet (20 mg total) by mouth daily. 02/02/23 05/03/23  Burnard Debby LABOR, MD  Blood Pressure Monitoring (OMRON 3 SERIES BP MONITOR) DEVI Use to check blood pressure as needed 06/30/23   Burnard Debby LABOR, MD  cetirizine (ZYRTEC) 10 MG tablet Take 10 mg by mouth daily.    [provider]  chlorthalidone  (HYGROTON ) 25 MG tablet Take 1 tablet (25 mg total) by mouth daily. 06/30/23   Burnard Debby LABOR, MD  citalopram  (CELEXA ) 20 MG tablet TAKE ONE AND ONE-HALF TABLETS BY MOUTH DAILY 09/21/23   Berneta Elsie Sayre, MD  ezetimibe  (ZETIA ) 10 MG tablet Take 1 tablet (10 mg total) by mouth at bedtime. 09/21/23   Burnard Debby LABOR, MD  ferrous sulfate 324 MG TBEC Take 324 mg by mouth.    [provider]   metFORMIN  (GLUCOPHAGE -XR) 500 MG 24 hr tablet Take 1 tablet (500 mg total) by mouth 2 (two) times daily with a meal. 09/14/23   Berneta Elsie Sayre, MD  Multiple Vitamin (MULTIVITAMIN) tablet Take 1 tablet by mouth daily.    [provider]  nitroGLYCERIN  (NITROSTAT ) 0.4 MG SL tablet For chest pain, tightness, or pressure. While sitting, place 1 tablet under tongue. May be used every 5 minutes as needed, for up to 15 minutes. Do not use more than 3 tablets. 06/16/22   Burnard Debby LABOR, MD  olmesartan  (BENICAR ) 40 MG tablet TAKE 1 TABLET BY MOUTH AT BEDTIME 07/07/23   Burnard Debby LABOR, MD  oxymetazoline  (AFRIN) 0.05 % nasal spray Place 1 spray into both nostrils 2 (two) times daily.    [provider]  pregabalin  (LYRICA ) 50 MG capsule Take 1 capsule (50 mg total) by mouth daily. Patient taking differently: Take 50 mg by mouth 2 (two) times daily. 01/09/23   Wertman, Sara E, PA-C  pyridOXINE (VITAMIN B6) 100 MG tablet Take 100 mg by mouth daily.    [provider]  trolamine salicylate (ASPERCREME) 10 % cream Apply 1 Application topically as needed for muscle pain.    [provider]  vitamin C  (ASCORBIC ACID ) 500 MG tablet Take 500 mg by mouth daily.    [provider]  vitamin E  180 MG (  400 UNITS) capsule Take 400 Units by mouth daily.    [provider]    Allergies: Oxycodone     Review of Systems  Constitutional:  Negative for chills and fever.  HENT:  Positive for facial swelling. Negative for ear pain and sore throat.   Eyes:  Negative for pain and visual disturbance.  Respiratory:  Negative for cough and shortness of breath.   Cardiovascular:  Negative for chest pain and palpitations.  Gastrointestinal:  Negative for abdominal pain and vomiting.  Genitourinary:  Negative for dysuria and hematuria.  Musculoskeletal:  Negative for arthralgias, back pain and neck pain.  Skin:  Negative for color change and rash.  Neurological:  Negative  for seizures and syncope.  All other systems reviewed and are negative.   Updated Vital Signs BP 125/74 (BP Location: Right Arm)   Pulse (!) 56   Temp (!) 97.4 F (36.3 C)   Resp 14   SpO2 98%   Physical Exam Vitals and nursing note reviewed.  Constitutional:      General: He is not in acute distress.    Appearance: Normal appearance. He is well-developed. He is not ill-appearing.  HENT:     Head: Normocephalic.     Comments: Abrasion to the forehead more so on the left.  Abrasion to the bridge of the nose.  With a very small 5 mm laceration at the bridge.  Bleeding controlled.    Nose:     Comments: No bleeding.  No septal hematoma.  Septum intact and in good location.  No obvious nose deformity.    Mouth/Throat:     Mouth: Mucous membranes are moist.     Comments: Inner lip with laceration measuring about 1 cm.  Questionable maybe a few millimeter through and through on the outside but is pretty much closed now.  Opinions to be more of an abrasion.  Teeth intact  Eyes:     Extraocular Movements: Extraocular movements intact.     Conjunctiva/sclera: Conjunctivae normal.     Pupils: Pupils are equal, round, and reactive to light.    Cardiovascular:     Rate and Rhythm: Normal rate and regular rhythm.     Heart sounds: No murmur heard. Pulmonary:     Effort: Pulmonary effort is normal. No respiratory distress.     Breath sounds: Normal breath sounds.  Abdominal:     Palpations: Abdomen is soft.     Tenderness: There is no abdominal tenderness.   Musculoskeletal:        General: Tenderness and signs of injury present. No swelling or deformity.     Cervical back: Neck supple. No rigidity or tenderness.     Comments: Swelling to the thenar eminence.  No snuffbox tenderness.  Radial pulse 2+.  Sensation intact to fingers good range of motion of the thumb.  Good movement at the elbow and shoulder.  Good movement of all the other fingers.  Superficial abrasions to both knees.    Skin:    General: Skin is warm and dry.     Capillary Refill: Capillary refill takes less than 2 seconds.   Neurological:     General: No focal deficit present.     Mental Status: He is alert and oriented to person, place, and time.     Cranial Nerves: No cranial nerve deficit.     Sensory: No sensory deficit.     Motor: No weakness.   Psychiatric:        Mood and  Affect: Mood normal.     (all labs ordered are listed, but only abnormal results are displayed) Labs Reviewed - No data to display  EKG: None  Radiology: CT Head Wo Contrast Result Date: 10/30/2023 CLINICAL DATA:  Blunt polytrauma from fall EXAM: CT HEAD WITHOUT CONTRAST TECHNIQUE: Contiguous axial images were obtained from the base of the skull through the vertex without intravenous contrast. RADIATION DOSE REDUCTION: This exam was performed according to the departmental dose-optimization program which includes automated exposure control, adjustment of the mA and/or kV according to patient size and/or use of iterative reconstruction technique. COMPARISON:  MRI head 06/21/2018 FINDINGS: Brain: No intracranial hemorrhage, mass effect, or evidence of acute infarct. No hydrocephalus. No extra-axial fluid collection. Age-commensurate cerebral atrophy and chronic small vessel ischemic disease. Vascular: No hyperdense vessel. Intracranial arterial calcification. Skull: No fracture or focal lesion. Sinuses/Orbits: No acute finding. Other: None. IMPRESSION: No acute intracranial abnormality. Electronically Signed   By: Norman Gatlin M.D.   On: 10/30/2023 18:02   DG Wrist Complete Left Result Date: 10/30/2023 CLINICAL DATA:  fall EXAM: LEFT WRIST - COMPLETE 3+ VIEW COMPARISON:  None Available. FINDINGS: No acute fracture or dislocation. Moderate joint space loss of the first carpometacarpal joint. Peripheral vascular atherosclerosis. IMPRESSION: 1. No acute fracture or dislocation. 2. Moderate osteoarthritis at the base of the  thumb. Electronically Signed   By: Rogelia Myers M.D.   On: 10/30/2023 17:56     Procedures   Medications Ordered in the ED  acetaminophen  (TYLENOL ) tablet 650 mg (650 mg Oral Given 10/30/23 2038)                                    Medical Decision Making Amount and/or Complexity of Data Reviewed Radiology: ordered.  Risk OTC drugs.  CT head without any acute findings.  X-ray of the left wrist without any bony abnormalities.  No fracture no dislocation.  Patient stable for discharge home.  No reason to suture the inner lip laceration at this point.  Would recommend patient follow-up with orthopedics if left wrist does not feel completely better in a week.  Again no snuffbox tenderness.  Abrasions to the forehead nose.  No obvious or significant nose deformity.  No neck pain no back pain  Final diagnoses:  Fall, initial encounter  Injury of head, initial encounter  Facial abrasion, initial encounter  Abrasion of left knee, initial encounter  Lip laceration, initial encounter    ED Discharge Orders     None          Geraldene Hamilton, MD 10/31/23 0001

## 2023-11-03 DIAGNOSIS — E119 Type 2 diabetes mellitus without complications: Secondary | ICD-10-CM | POA: Diagnosis not present

## 2023-12-04 DIAGNOSIS — E119 Type 2 diabetes mellitus without complications: Secondary | ICD-10-CM | POA: Diagnosis not present

## 2023-12-24 DIAGNOSIS — M1711 Unilateral primary osteoarthritis, right knee: Secondary | ICD-10-CM | POA: Diagnosis not present

## 2024-01-04 DIAGNOSIS — E119 Type 2 diabetes mellitus without complications: Secondary | ICD-10-CM | POA: Diagnosis not present

## 2024-01-05 ENCOUNTER — Encounter: Payer: Self-pay | Admitting: Family Medicine

## 2024-01-15 ENCOUNTER — Other Ambulatory Visit: Payer: Self-pay

## 2024-01-15 DIAGNOSIS — I10 Essential (primary) hypertension: Secondary | ICD-10-CM

## 2024-01-15 MED ORDER — CHLORTHALIDONE 25 MG PO TABS: 25.0000 mg | ORAL_TABLET | Freq: Every day | ORAL | 2 refills | Status: DC

## 2024-01-19 ENCOUNTER — Other Ambulatory Visit: Payer: Self-pay | Admitting: Neurological Surgery

## 2024-01-19 ENCOUNTER — Encounter: Payer: Self-pay | Admitting: Neurological Surgery

## 2024-01-19 DIAGNOSIS — M5416 Radiculopathy, lumbar region: Secondary | ICD-10-CM

## 2024-01-19 DIAGNOSIS — Z6831 Body mass index (BMI) 31.0-31.9, adult: Secondary | ICD-10-CM | POA: Diagnosis not present

## 2024-01-21 ENCOUNTER — Ambulatory Visit
Admission: RE | Admit: 2024-01-21 | Discharge: 2024-01-21 | Disposition: A | Discharge status: Home / Self Care | Source: Ambulatory Visit | Attending: Neurological Surgery | Admitting: Neurological Surgery

## 2024-01-21 DIAGNOSIS — M5126 Other intervertebral disc displacement, lumbar region: Secondary | ICD-10-CM | POA: Diagnosis not present

## 2024-01-21 DIAGNOSIS — M5416 Radiculopathy, lumbar region: Secondary | ICD-10-CM

## 2024-01-21 DIAGNOSIS — M47816 Spondylosis without myelopathy or radiculopathy, lumbar region: Secondary | ICD-10-CM | POA: Diagnosis not present

## 2024-01-21 DIAGNOSIS — M5136 Other intervertebral disc degeneration, lumbar region with discogenic back pain only: Secondary | ICD-10-CM | POA: Diagnosis not present

## 2024-01-25 ENCOUNTER — Other Ambulatory Visit: Payer: Self-pay | Admitting: Cardiovascular Disease

## 2024-02-02 DIAGNOSIS — M5416 Radiculopathy, lumbar region: Secondary | ICD-10-CM | POA: Diagnosis not present

## 2024-02-02 DIAGNOSIS — Z6830 Body mass index (BMI) 30.0-30.9, adult: Secondary | ICD-10-CM | POA: Diagnosis not present

## 2024-02-25 DIAGNOSIS — L738 Other specified follicular disorders: Secondary | ICD-10-CM | POA: Diagnosis not present

## 2024-02-25 DIAGNOSIS — L820 Inflamed seborrheic keratosis: Secondary | ICD-10-CM | POA: Diagnosis not present

## 2024-02-25 DIAGNOSIS — Z85828 Personal history of other malignant neoplasm of skin: Secondary | ICD-10-CM | POA: Diagnosis not present

## 2024-02-25 DIAGNOSIS — C44311 Basal cell carcinoma of skin of nose: Secondary | ICD-10-CM | POA: Diagnosis not present

## 2024-02-25 DIAGNOSIS — L570 Actinic keratosis: Secondary | ICD-10-CM | POA: Diagnosis not present

## 2024-02-25 DIAGNOSIS — C4441 Basal cell carcinoma of skin of scalp and neck: Secondary | ICD-10-CM | POA: Diagnosis not present

## 2024-02-25 DIAGNOSIS — L821 Other seborrheic keratosis: Secondary | ICD-10-CM | POA: Diagnosis not present

## 2024-02-26 DIAGNOSIS — M25532 Pain in left wrist: Secondary | ICD-10-CM | POA: Diagnosis not present

## 2024-03-08 ENCOUNTER — Other Ambulatory Visit: Payer: Self-pay | Admitting: Family Medicine

## 2024-03-08 DIAGNOSIS — F32A Depression, unspecified: Secondary | ICD-10-CM

## 2024-03-08 NOTE — Telephone Encounter (Signed)
 Requesting: citalopram  (CELEXA ) 20 MG tablet Last Visit: 09/14/2023 Next Visit: 06/16/2024 Last Refill: 09/21/2023  Please Advise

## 2024-03-12 DIAGNOSIS — M25532 Pain in left wrist: Secondary | ICD-10-CM | POA: Diagnosis not present

## 2024-03-18 DIAGNOSIS — G5602 Carpal tunnel syndrome, left upper limb: Secondary | ICD-10-CM | POA: Diagnosis not present

## 2024-03-28 ENCOUNTER — Other Ambulatory Visit: Payer: Self-pay

## 2024-03-28 DIAGNOSIS — I10 Essential (primary) hypertension: Secondary | ICD-10-CM

## 2024-03-29 MED ORDER — CHLORTHALIDONE 25 MG PO TABS: 25.0000 mg | ORAL_TABLET | Freq: Every day | ORAL | 1 refills | Status: AC

## 2024-04-08 DIAGNOSIS — M25551 Pain in right hip: Secondary | ICD-10-CM | POA: Diagnosis not present

## 2024-04-08 DIAGNOSIS — M25552 Pain in left hip: Secondary | ICD-10-CM | POA: Diagnosis not present

## 2024-04-14 DIAGNOSIS — G5602 Carpal tunnel syndrome, left upper limb: Secondary | ICD-10-CM | POA: Diagnosis not present

## 2024-04-14 DIAGNOSIS — G5622 Lesion of ulnar nerve, left upper limb: Secondary | ICD-10-CM | POA: Diagnosis not present

## 2024-04-21 ENCOUNTER — Telehealth (HOSPITAL_BASED_OUTPATIENT_CLINIC_OR_DEPARTMENT_OTHER): Payer: Self-pay | Admitting: *Deleted

## 2024-04-21 DIAGNOSIS — G5602 Carpal tunnel syndrome, left upper limb: Secondary | ICD-10-CM | POA: Diagnosis not present

## 2024-04-21 NOTE — Telephone Encounter (Signed)
° °  Pre-operative Risk Assessment    Patient Name: Dakota Gibbs  DOB: 07/28/1950 MRN: [id number]   Date of last office visit: 09/17/23 DR. KELLY Date of next office visit: NONE   Request for Surgical Clearance    Procedure:  LEFT ELBOW ULNAR NERVE RELEASE AND/OR TRANSPOSITION; LEFT HAND REVISION CARPAL TUNNEL RELEASE   Date of Surgery:  Clearance 05/25/24                                Surgeon:  DR. FRED [facility] or Practice Name:  [facility] Phone number:  [phone] MEGAN DAVIS Fax number:  [phone]   Type of Clearance Requested:   - Medical  - Pharmacy:  Hold Aspirin      Type of Anesthesia:  CHOICE   Additional requests/questions:    Bonney Niels Jest   04/21/2024, 2:59 PM

## 2024-04-21 NOTE — Telephone Encounter (Signed)
 Patient scheduled for pre-op clearance on 05/09/24 with Katlyn West, NP.     Patient Consent for Virtual Visit        Dakota Gibbs has provided verbal consent on 04/21/2024 for a virtual visit (video or telephone).   CONSENT FOR VIRTUAL VISIT FOR:  Dakota Gibbs  By participating in this virtual visit I agree to the following:  I hereby voluntarily request, consent and authorize [facility] and its employed or contracted physicians, physician assistants, nurse practitioners or other licensed health care professionals (the Practitioner), to provide me with telemedicine health care services (the Services) as deemed necessary by the treating Practitioner. I acknowledge and consent to receive the Services by the Practitioner via telemedicine. I understand that the telemedicine visit will involve communicating with the Practitioner through live audiovisual communication technology and the disclosure of certain medical information by electronic transmission. I acknowledge that I have been given the opportunity to request an in-person assessment or other available alternative prior to the telemedicine visit and am voluntarily participating in the telemedicine visit.  I understand that I have the right to withhold or withdraw my consent to the use of telemedicine in the course of my care at any time, without affecting my right to future care or treatment, and that the Practitioner or I may terminate the telemedicine visit at any time. I understand that I have the right to inspect all information obtained and/or recorded in the course of the telemedicine visit and may receive copies of available information for a reasonable fee.  I understand that some of the potential risks of receiving the Services via telemedicine include:  Delay or interruption in medical evaluation due to technological equipment failure or disruption; Information transmitted may not be sufficient (e.g. poor  resolution of images) to allow for appropriate medical decision making by the Practitioner; and/or  In rare instances, security protocols could fail, causing a breach of personal health information.  Furthermore, I acknowledge that it is my responsibility to provide information about my medical history, conditions and care that is complete and accurate to the best of my ability. I acknowledge that Practitioner's advice, recommendations, and/or decision may be based on factors not within their control, such as incomplete or inaccurate data provided by me or distortions of diagnostic images or specimens that may result from electronic transmissions. I understand that the practice of medicine is not an exact science and that Practitioner makes no warranties or guarantees regarding treatment outcomes. I acknowledge that a copy of this consent can be made available to me via my patient portal [facility] MyChart), or I can request a printed copy by calling the office of [facility].    I understand that my insurance will be billed for this visit.   I have read or had this consent read to me. I understand the contents of this consent, which adequately explains the benefits and risks of the Services being provided via telemedicine.  I have been provided ample opportunity to ask questions regarding this consent and the Services and have had my questions answered to my satisfaction. I give my informed consent for the services to be provided through the use of telemedicine in my medical care

## 2024-04-21 NOTE — Telephone Encounter (Signed)
° °  Name: Dakota Gibbs  DOB: 1951/02/21  MRN: [id number]  Primary Cardiologist: None   Preoperative team, please contact this patient and set up a phone call appointment for further preoperative risk assessment. Please obtain consent and complete medication review. Thank you for your help.  I confirm that guidance regarding antiplatelet and oral anticoagulation therapy has been completed and, if necessary, noted below.  Ideally aspirin  should be continued without interruption, however if the bleeding risk is too great, aspirin  may be held for 5-7 days prior to surgery. Please resume aspirin  post operatively when it is felt to be safe from a bleeding standpoint.    I also confirmed the patient resides in the state of [place] . As per [facility] Medical Board telemedicine laws, the patient must reside in the state in which the provider is licensed.    Barnie Hila, NP 04/21/2024, 3:23 PM [facility]

## 2024-05-06 NOTE — Progress Notes (Signed)
 "   Virtual Visit via Telephone Note   Because of Dakota Gibbs co-morbid illnesses, he is at least at moderate risk for complications without adequate follow up.  This format is felt to be most appropriate for this patient at this time.  Due to technical limitations with video connection (technology), today's appointment will be conducted as an audio only telehealth visit, and Dakota Gibbs verbally agreed to proceed in this manner.   All issues noted in this document were discussed and addressed.  No physical exam could be performed with this format.  Evaluation Performed:  Preoperative cardiovascular risk assessment _____________   Date:  05/10/2024   Patient ID:  Dakota Gibbs, DOB 02-19-1951, MRN [id number] Patient Location:  Home Provider location:   Office  Primary Care Provider:  Berneta Elsie Sayre, MD Primary Cardiologist:  None Chief Complaint / Patient Profile  74 y.o. y/o male with a h/o CAD, palpitations/PVC, hypertension, hyperlipidemia, type II DM and GERD who is pending left elbow ulnar nerve release and/or transposition, left hand revision carpal tunnel release on 05/25/2024 and presents today for telephonic preoperative cardiovascular risk assessment. History of Present Illness  Dakota Gibbs is a 74 y.o. male who presents via audio/video conferencing for a telehealth visit today.  Pt was last seen in cardiology clinic on 09/17/2023 by Dr. Burnard.  At that time Dakota Gibbs was doing well.  The patient is now pending procedure as outlined above. Since his last visit, he has remained stable from a cardiac standpoint. Today denies chest pain, shortness of breath, lower extremity edema, fatigue, palpitations, melena, hematuria, hemoptysis, diaphoresis, weakness, presyncope, syncope, orthopnea, and PND. He is able to achieve greater than 4 METs of activity.  Past Medical History    Past Medical History:  Diagnosis Date   Allergy    enviornmental    Anxiety    Anxiety and depression 10/25/2012   Arthritis    self dx   Back pain    L4-L5 bulging disc, L5-S1 - bulging disc, pinched nerve in neck   Cataract    bilateral sx   Coronary artery disease    hx of balloon angioplasty with DR T Dakota   Depression    Diabetes mellitus    type II   Dyspnea    History of shingles 2009   Hyperlipidemia    on meds   Hypertension    on meds   Myocardial infarction (HCC) 1997   caused by a blood clot   Neuropathy    Sleep apnea    cpap   Past Surgical History:  Procedure Laterality Date   ANGIOPLASTY  1997   BACK SURGERY  04/02/2022   basel cell     skin carcinoma removed x 4   BILATERAL CARPAL TUNNEL RELEASE  2023   CARDIAC CATHETERIZATION     10/04/1999   COLONOSCOPY  2018   SA-MAC-suprep(good)-TA   COSMETIC SURGERY     right wrist - removal of keloid   DOPPLER ECHOCARDIOGRAPHY  07/09/2006   EF 50 to 55 %, LA mildy dilated   HERNIA REPAIR  1951   RIGHT   KNEE ARTHROSCOPY Right 2001   R   NM MYOCAR PERF WALL MOTION  08/22/2011   Mets 13,low risk study   POLYPECTOMY  2018   TA   TOTAL KNEE ARTHROPLASTY Left 08/13/2022   Procedure: TOTAL KNEE ARTHROPLASTY;  Surgeon: Duwayne Purchase, MD;  Location: [facility];  Service: Orthopedics;  Laterality: Left;  WRIST SURGERY  1984   right   Allergies Allergies[1] Home Medications    Prior to Admission medications  Medication Sig Start Date End Date Taking? Authorizing Provider  amLODipine  (NORVASC ) 5 MG tablet Take 1 tablet (5 mg total) by mouth daily. 04/27/23   Dakota Debby LABOR, MD  aspirin  EC 81 MG tablet Take 81 mg by mouth daily. Swallow whole.    [provider]  atorvastatin  (LIPITOR) 20 MG tablet TAKE 1 TABLET(20 MG) BY MOUTH DAILY 01/25/24   Loistine Sober, NP  Blood Pressure Monitoring (OMRON 3 SERIES BP MONITOR) DEVI Use to check blood pressure as needed 06/30/23   Dakota Debby LABOR, MD  cetirizine (ZYRTEC) 10 MG tablet Take 10 mg by mouth daily.    [provider]  chlorthalidone  (HYGROTON ) 25 MG tablet Take 1 tablet (25 mg total) by mouth daily. 03/29/24   Loistine Sober, NP  citalopram  (CELEXA ) 20 MG tablet TAKE ONE AND ONE-HALF TABLETS BY MOUTH DAILY 03/08/24   Berneta Elsie Sayre, MD  ezetimibe  (ZETIA ) 10 MG tablet Take 1 tablet (10 mg total) by mouth at bedtime. 09/21/23   Dakota Debby LABOR, MD  ferrous sulfate 324 MG TBEC Take 324 mg by mouth.    [provider]  metFORMIN  (GLUCOPHAGE -XR) 500 MG 24 hr tablet Take 1 tablet (500 mg total) by mouth 2 (two) times daily with a meal. 09/14/23   Berneta Elsie Sayre, MD  Multiple Vitamin (MULTIVITAMIN) tablet Take 1 tablet by mouth daily.    [provider]  nitroGLYCERIN  (NITROSTAT ) 0.4 MG SL tablet For chest pain, tightness, or pressure. While sitting, place 1 tablet under tongue. May be used every 5 minutes as needed, for up to 15 minutes. Do not use more than 3 tablets. 06/16/22   Dakota Debby LABOR, MD  olmesartan  (BENICAR ) 40 MG tablet TAKE 1 TABLET BY MOUTH AT BEDTIME 07/07/23   Dakota Debby LABOR, MD  oxymetazoline  (AFRIN) 0.05 % nasal spray Place 1 spray into both nostrils 2 (two) times daily.    [provider]  pregabalin  (LYRICA ) 50 MG capsule Take 1 capsule (50 mg total) by mouth daily. Patient taking differently: Take 50 mg by mouth 2 (two) times daily. 01/09/23   Wertman, Sara E, PA-C  pyridOXINE (VITAMIN B6) 100 MG tablet Take 100 mg by mouth daily.    [provider]  trolamine salicylate (ASPERCREME) 10 % cream Apply 1 Application topically as needed for muscle pain.    [provider]  vitamin C  (ASCORBIC ACID ) 500 MG tablet Take 500 mg by mouth daily.    [provider]  vitamin E  180 MG (400 UNITS) capsule Take 400 Units by mouth daily.    [provider]    Physical Exam   Vital Signs:  Dakota Gibbs does not have vital signs available for review today. Given telephonic nature of communication, physical exam  is limited. AAOx3. NAD. Normal affect.  Speech and respirations are unlabored. Accessory Clinical Findings  None Assessment & Plan    1.  Preoperative Cardiovascular Risk Assessment: Dakota Gibbs perioperative risk of a major cardiac event is 0.9% according to the Revised Cardiac Risk Index (RCRI).  Therefore, he is at low risk for perioperative complications.   His functional capacity is good at 7.99 METs according to the [facility] Activity Status Index (DASI). Recommendations: According to ACC/AHA guidelines, no further cardiovascular testing needed.  The patient may proceed to surgery at acceptable risk.   Antiplatelet and/or Anticoagulation Recommendations:  Ideally aspirin  should be continued without interruption, however if the bleeding risk is too great, aspirin  may be held for 5-7 days prior to surgery. Please resume aspirin  post operatively when it is felt to be safe from a bleeding standpoint.   The patient was advised that if he develops new symptoms prior to surgery to contact our office to arrange for a follow-up visit, and he verbalized understanding.  A copy of this note will be routed to requesting surgeon.  Time:   Today, I have spent 10 minutes with the patient with telehealth technology discussing medical history, symptoms, and management plan.    Makaio Mach D Sundus Pete, NP  05/10/2024, 9:38 AM      [1]  Allergies Allergen Reactions   Oxycodone  Diarrhea and Nausea Only   "

## 2024-05-10 ENCOUNTER — Ambulatory Visit: Attending: Cardiology

## 2024-05-10 DIAGNOSIS — Z01810 Encounter for preprocedural cardiovascular examination: Secondary | ICD-10-CM | POA: Diagnosis not present

## 2024-05-16 ENCOUNTER — Ambulatory Visit

## 2024-05-17 ENCOUNTER — Encounter: Admitting: Family Medicine

## 2024-06-03 ENCOUNTER — Other Ambulatory Visit: Payer: Self-pay | Admitting: Family Medicine

## 2024-06-03 DIAGNOSIS — R7303 Prediabetes: Secondary | ICD-10-CM

## 2024-06-07 ENCOUNTER — Other Ambulatory Visit: Payer: Self-pay

## 2024-06-07 DIAGNOSIS — I10 Essential (primary) hypertension: Secondary | ICD-10-CM

## 2024-06-07 MED ORDER — OLMESARTAN MEDOXOMIL 40 MG PO TABS: 40.0000 mg | ORAL_TABLET | Freq: Every day | ORAL | 3 refills | Status: AC

## 2024-06-07 MED ORDER — AMLODIPINE BESYLATE 5 MG PO TABS: 5.0000 mg | ORAL_TABLET | Freq: Every day | ORAL | 3 refills | Status: AC

## 2024-06-10 ENCOUNTER — Ambulatory Visit

## 2024-06-16 ENCOUNTER — Encounter: Admitting: Family Medicine
